# Patient Record
Sex: Female | Born: 1965 | Race: White | Hispanic: No | Marital: Single | Smoking: Current every day smoker
Health system: Southern US, Community
[De-identification: ages and names within clinical notes are randomized; demographics above are authoritative.]

## PROBLEM LIST (undated history)

## (undated) DIAGNOSIS — G43109 Migraine with aura, not intractable, without status migrainosus: Secondary | ICD-10-CM

## (undated) DIAGNOSIS — Z8719 Personal history of other diseases of the digestive system: Secondary | ICD-10-CM

## (undated) DIAGNOSIS — K589 Irritable bowel syndrome without diarrhea: Secondary | ICD-10-CM

## (undated) DIAGNOSIS — R7303 Prediabetes: Secondary | ICD-10-CM

## (undated) DIAGNOSIS — F319 Bipolar disorder, unspecified: Secondary | ICD-10-CM

## (undated) DIAGNOSIS — F419 Anxiety disorder, unspecified: Secondary | ICD-10-CM

## (undated) DIAGNOSIS — F32A Depression, unspecified: Secondary | ICD-10-CM

## (undated) DIAGNOSIS — I10 Essential (primary) hypertension: Secondary | ICD-10-CM

## (undated) DIAGNOSIS — K219 Gastro-esophageal reflux disease without esophagitis: Secondary | ICD-10-CM

## (undated) DIAGNOSIS — L409 Psoriasis, unspecified: Secondary | ICD-10-CM

## (undated) DIAGNOSIS — M92219 Osteochondrosis (juvenile) of carpal lunate [Kienbock], unspecified hand: Secondary | ICD-10-CM

## (undated) DIAGNOSIS — K2270 Barrett's esophagus without dysplasia: Secondary | ICD-10-CM

## (undated) DIAGNOSIS — F329 Major depressive disorder, single episode, unspecified: Secondary | ICD-10-CM

## (undated) DIAGNOSIS — M1990 Unspecified osteoarthritis, unspecified site: Secondary | ICD-10-CM

## (undated) DIAGNOSIS — Z87448 Personal history of other diseases of urinary system: Secondary | ICD-10-CM

## (undated) DIAGNOSIS — Z8711 Personal history of peptic ulcer disease: Secondary | ICD-10-CM

## (undated) DIAGNOSIS — Z8614 Personal history of Methicillin resistant Staphylococcus aureus infection: Secondary | ICD-10-CM

## (undated) DIAGNOSIS — E669 Obesity, unspecified: Secondary | ICD-10-CM

## (undated) HISTORY — DX: Irritable bowel syndrome, unspecified: K58.9

## (undated) HISTORY — PX: MULTIPLE TOOTH EXTRACTIONS: SHX2053

## (undated) HISTORY — DX: Barrett's esophagus without dysplasia: K22.70

## (undated) HISTORY — PX: KNEE ARTHROSCOPY: SHX127

## (undated) HISTORY — PX: CERVICAL FUSION: SHX112

---

## 1989-01-11 HISTORY — PX: TUBAL LIGATION: SHX77

## 1989-01-11 HISTORY — PX: CHOLECYSTECTOMY: SHX55

## 1997-06-25 ENCOUNTER — Emergency Department (HOSPITAL_COMMUNITY): Admission: EM | Admit: 1997-06-25 | Discharge: 1997-06-25 | Payer: Self-pay | Admitting: Emergency Medicine

## 1997-09-25 ENCOUNTER — Emergency Department (HOSPITAL_COMMUNITY): Admission: EM | Admit: 1997-09-25 | Discharge: 1997-09-25 | Payer: Self-pay | Admitting: Emergency Medicine

## 1998-03-10 ENCOUNTER — Emergency Department (HOSPITAL_COMMUNITY): Admission: EM | Admit: 1998-03-10 | Discharge: 1998-03-10 | Payer: Self-pay

## 1998-03-27 ENCOUNTER — Ambulatory Visit (HOSPITAL_COMMUNITY): Admission: RE | Admit: 1998-03-27 | Discharge: 1998-03-27 | Payer: Self-pay | Admitting: Neurosurgery

## 1998-03-27 ENCOUNTER — Encounter: Payer: Self-pay | Admitting: Neurosurgery

## 1998-04-04 ENCOUNTER — Encounter: Payer: Self-pay | Admitting: Neurosurgery

## 1998-04-08 ENCOUNTER — Encounter: Payer: Self-pay | Admitting: Neurosurgery

## 1998-04-08 ENCOUNTER — Inpatient Hospital Stay (HOSPITAL_COMMUNITY): Admission: RE | Admit: 1998-04-08 | Discharge: 1998-04-10 | Payer: Self-pay | Admitting: Neurosurgery

## 1999-02-28 ENCOUNTER — Encounter: Payer: Self-pay | Admitting: Neurosurgery

## 1999-02-28 ENCOUNTER — Ambulatory Visit (HOSPITAL_COMMUNITY): Admission: RE | Admit: 1999-02-28 | Discharge: 1999-02-28 | Payer: Self-pay | Admitting: Neurosurgery

## 1999-03-01 ENCOUNTER — Encounter: Payer: Self-pay | Admitting: Neurosurgery

## 1999-09-09 ENCOUNTER — Encounter: Payer: Self-pay | Admitting: Emergency Medicine

## 1999-09-09 ENCOUNTER — Emergency Department (HOSPITAL_COMMUNITY): Admission: EM | Admit: 1999-09-09 | Discharge: 1999-09-09 | Payer: Self-pay | Admitting: Emergency Medicine

## 2000-09-19 ENCOUNTER — Emergency Department (HOSPITAL_COMMUNITY): Admission: EM | Admit: 2000-09-19 | Discharge: 2000-09-20 | Payer: Self-pay | Admitting: Emergency Medicine

## 2000-10-03 ENCOUNTER — Emergency Department (HOSPITAL_COMMUNITY): Admission: EM | Admit: 2000-10-03 | Discharge: 2000-10-03 | Payer: Self-pay | Admitting: Emergency Medicine

## 2000-12-10 ENCOUNTER — Emergency Department (HOSPITAL_COMMUNITY): Admission: EM | Admit: 2000-12-10 | Discharge: 2000-12-10 | Payer: Self-pay | Admitting: Emergency Medicine

## 2000-12-10 ENCOUNTER — Encounter: Payer: Self-pay | Admitting: Emergency Medicine

## 2004-06-24 ENCOUNTER — Emergency Department (HOSPITAL_COMMUNITY): Admission: EM | Admit: 2004-06-24 | Discharge: 2004-06-24 | Payer: Self-pay | Admitting: Emergency Medicine

## 2004-09-03 ENCOUNTER — Emergency Department (HOSPITAL_COMMUNITY): Admission: EM | Admit: 2004-09-03 | Discharge: 2004-09-03 | Payer: Self-pay | Admitting: Emergency Medicine

## 2004-11-26 ENCOUNTER — Emergency Department (HOSPITAL_COMMUNITY): Admission: EM | Admit: 2004-11-26 | Discharge: 2004-11-26 | Payer: Self-pay | Admitting: Emergency Medicine

## 2005-02-09 ENCOUNTER — Emergency Department (HOSPITAL_COMMUNITY): Admission: EM | Admit: 2005-02-09 | Discharge: 2005-02-09 | Payer: Self-pay | Admitting: Emergency Medicine

## 2005-02-11 ENCOUNTER — Emergency Department (HOSPITAL_COMMUNITY): Admission: EM | Admit: 2005-02-11 | Discharge: 2005-02-11 | Payer: Self-pay | Admitting: Emergency Medicine

## 2005-06-09 ENCOUNTER — Emergency Department (HOSPITAL_COMMUNITY): Admission: EM | Admit: 2005-06-09 | Discharge: 2005-06-09 | Payer: Self-pay | Admitting: Emergency Medicine

## 2005-07-27 ENCOUNTER — Emergency Department (HOSPITAL_COMMUNITY): Admission: EM | Admit: 2005-07-27 | Discharge: 2005-07-27 | Payer: Self-pay | Admitting: Emergency Medicine

## 2005-09-14 ENCOUNTER — Emergency Department (HOSPITAL_COMMUNITY): Admission: EM | Admit: 2005-09-14 | Discharge: 2005-09-14 | Payer: Self-pay | Admitting: Emergency Medicine

## 2005-10-25 ENCOUNTER — Emergency Department (HOSPITAL_COMMUNITY): Admission: EM | Admit: 2005-10-25 | Discharge: 2005-10-25 | Payer: Self-pay | Admitting: Emergency Medicine

## 2005-10-31 ENCOUNTER — Emergency Department (HOSPITAL_COMMUNITY): Admission: EM | Admit: 2005-10-31 | Discharge: 2005-10-31 | Payer: Self-pay | Admitting: Emergency Medicine

## 2005-11-04 ENCOUNTER — Ambulatory Visit: Payer: Self-pay | Admitting: Internal Medicine

## 2005-11-08 ENCOUNTER — Ambulatory Visit: Payer: Self-pay | Admitting: *Deleted

## 2005-11-17 ENCOUNTER — Ambulatory Visit (HOSPITAL_COMMUNITY): Admission: RE | Admit: 2005-11-17 | Discharge: 2005-11-17 | Payer: Self-pay | Admitting: Internal Medicine

## 2005-11-19 ENCOUNTER — Ambulatory Visit: Payer: Self-pay | Admitting: Internal Medicine

## 2005-12-23 ENCOUNTER — Emergency Department (HOSPITAL_COMMUNITY): Admission: EM | Admit: 2005-12-23 | Discharge: 2005-12-23 | Payer: Self-pay | Admitting: Emergency Medicine

## 2005-12-23 ENCOUNTER — Emergency Department (HOSPITAL_COMMUNITY): Admission: EM | Admit: 2005-12-23 | Discharge: 2005-12-23 | Payer: Self-pay | Admitting: Family Medicine

## 2006-02-01 ENCOUNTER — Ambulatory Visit: Payer: Self-pay | Admitting: Family Medicine

## 2006-03-25 ENCOUNTER — Ambulatory Visit: Payer: Self-pay | Admitting: Internal Medicine

## 2006-04-11 ENCOUNTER — Emergency Department (HOSPITAL_COMMUNITY): Admission: EM | Admit: 2006-04-11 | Discharge: 2006-04-11 | Payer: Self-pay | Admitting: Emergency Medicine

## 2006-06-07 ENCOUNTER — Encounter (INDEPENDENT_AMBULATORY_CARE_PROVIDER_SITE_OTHER): Payer: Self-pay | Admitting: Internal Medicine

## 2006-06-14 ENCOUNTER — Encounter: Admission: RE | Admit: 2006-06-14 | Discharge: 2006-07-11 | Payer: Self-pay | Admitting: Orthopedic Surgery

## 2006-07-19 ENCOUNTER — Encounter (INDEPENDENT_AMBULATORY_CARE_PROVIDER_SITE_OTHER): Payer: Self-pay | Admitting: Internal Medicine

## 2006-08-04 ENCOUNTER — Encounter (INDEPENDENT_AMBULATORY_CARE_PROVIDER_SITE_OTHER): Payer: Self-pay | Admitting: Internal Medicine

## 2006-08-11 ENCOUNTER — Encounter: Admission: RE | Admit: 2006-08-11 | Discharge: 2006-08-11 | Payer: Self-pay | Admitting: Specialist

## 2006-08-24 ENCOUNTER — Emergency Department (HOSPITAL_COMMUNITY): Admission: EM | Admit: 2006-08-24 | Discharge: 2006-08-24 | Payer: Self-pay | Admitting: Emergency Medicine

## 2006-09-07 ENCOUNTER — Encounter: Admission: RE | Admit: 2006-09-07 | Discharge: 2006-09-07 | Payer: Self-pay | Admitting: Orthopedic Surgery

## 2006-09-21 ENCOUNTER — Emergency Department (HOSPITAL_COMMUNITY): Admission: EM | Admit: 2006-09-21 | Discharge: 2006-09-21 | Payer: Self-pay | Admitting: Emergency Medicine

## 2006-09-28 ENCOUNTER — Encounter (INDEPENDENT_AMBULATORY_CARE_PROVIDER_SITE_OTHER): Payer: Self-pay | Admitting: *Deleted

## 2006-10-18 ENCOUNTER — Emergency Department (HOSPITAL_COMMUNITY): Admission: EM | Admit: 2006-10-18 | Discharge: 2006-10-18 | Payer: Self-pay | Admitting: Emergency Medicine

## 2006-11-08 ENCOUNTER — Encounter (INDEPENDENT_AMBULATORY_CARE_PROVIDER_SITE_OTHER): Payer: Self-pay | Admitting: Internal Medicine

## 2006-12-05 ENCOUNTER — Encounter (INDEPENDENT_AMBULATORY_CARE_PROVIDER_SITE_OTHER): Payer: Self-pay | Admitting: Internal Medicine

## 2006-12-05 ENCOUNTER — Inpatient Hospital Stay (HOSPITAL_COMMUNITY): Admission: RE | Admit: 2006-12-05 | Discharge: 2006-12-10 | Payer: Self-pay | Admitting: Orthopedic Surgery

## 2006-12-05 DIAGNOSIS — M5137 Other intervertebral disc degeneration, lumbosacral region: Secondary | ICD-10-CM | POA: Insufficient documentation

## 2006-12-05 HISTORY — PX: ANTERIOR LUMBAR FUSION: SHX1170

## 2006-12-06 ENCOUNTER — Encounter (INDEPENDENT_AMBULATORY_CARE_PROVIDER_SITE_OTHER): Payer: Self-pay | Admitting: Orthopedic Surgery

## 2006-12-06 ENCOUNTER — Encounter (INDEPENDENT_AMBULATORY_CARE_PROVIDER_SITE_OTHER): Payer: Self-pay | Admitting: Internal Medicine

## 2006-12-06 ENCOUNTER — Ambulatory Visit: Payer: Self-pay | Admitting: *Deleted

## 2006-12-18 ENCOUNTER — Emergency Department (HOSPITAL_COMMUNITY): Admission: EM | Admit: 2006-12-18 | Discharge: 2006-12-18 | Payer: Self-pay | Admitting: Emergency Medicine

## 2006-12-26 ENCOUNTER — Encounter (INDEPENDENT_AMBULATORY_CARE_PROVIDER_SITE_OTHER): Payer: Self-pay | Admitting: Internal Medicine

## 2007-01-23 ENCOUNTER — Encounter (INDEPENDENT_AMBULATORY_CARE_PROVIDER_SITE_OTHER): Payer: Self-pay | Admitting: Internal Medicine

## 2007-01-27 ENCOUNTER — Ambulatory Visit: Payer: Self-pay | Admitting: Internal Medicine

## 2007-01-27 DIAGNOSIS — B372 Candidiasis of skin and nail: Secondary | ICD-10-CM | POA: Insufficient documentation

## 2007-01-29 DIAGNOSIS — F329 Major depressive disorder, single episode, unspecified: Secondary | ICD-10-CM | POA: Insufficient documentation

## 2007-01-29 DIAGNOSIS — F411 Generalized anxiety disorder: Secondary | ICD-10-CM | POA: Insufficient documentation

## 2007-02-08 ENCOUNTER — Emergency Department (HOSPITAL_COMMUNITY): Admission: EM | Admit: 2007-02-08 | Discharge: 2007-02-08 | Payer: Self-pay | Admitting: Emergency Medicine

## 2007-02-08 ENCOUNTER — Encounter: Admission: RE | Admit: 2007-02-08 | Discharge: 2007-03-02 | Payer: Self-pay | Admitting: Orthopedic Surgery

## 2007-03-06 ENCOUNTER — Encounter (INDEPENDENT_AMBULATORY_CARE_PROVIDER_SITE_OTHER): Payer: Self-pay | Admitting: Internal Medicine

## 2007-03-10 ENCOUNTER — Emergency Department (HOSPITAL_COMMUNITY): Admission: EM | Admit: 2007-03-10 | Discharge: 2007-03-10 | Payer: Self-pay | Admitting: Emergency Medicine

## 2007-03-12 ENCOUNTER — Emergency Department (HOSPITAL_COMMUNITY): Admission: EM | Admit: 2007-03-12 | Discharge: 2007-03-12 | Payer: Self-pay | Admitting: Family Medicine

## 2007-03-21 ENCOUNTER — Ambulatory Visit: Payer: Self-pay | Admitting: Internal Medicine

## 2007-03-22 ENCOUNTER — Encounter (INDEPENDENT_AMBULATORY_CARE_PROVIDER_SITE_OTHER): Payer: Self-pay | Admitting: Internal Medicine

## 2007-03-27 ENCOUNTER — Telehealth (INDEPENDENT_AMBULATORY_CARE_PROVIDER_SITE_OTHER): Payer: Self-pay | Admitting: Internal Medicine

## 2007-03-30 ENCOUNTER — Encounter (INDEPENDENT_AMBULATORY_CARE_PROVIDER_SITE_OTHER): Payer: Self-pay | Admitting: Internal Medicine

## 2007-03-31 ENCOUNTER — Encounter: Admission: RE | Admit: 2007-03-31 | Discharge: 2007-06-06 | Payer: Self-pay | Admitting: Orthopedic Surgery

## 2007-04-04 ENCOUNTER — Encounter (INDEPENDENT_AMBULATORY_CARE_PROVIDER_SITE_OTHER): Payer: Self-pay | Admitting: Internal Medicine

## 2007-04-05 ENCOUNTER — Telehealth (INDEPENDENT_AMBULATORY_CARE_PROVIDER_SITE_OTHER): Payer: Self-pay | Admitting: Internal Medicine

## 2007-04-20 ENCOUNTER — Encounter (INDEPENDENT_AMBULATORY_CARE_PROVIDER_SITE_OTHER): Payer: Self-pay | Admitting: Internal Medicine

## 2007-04-24 ENCOUNTER — Telehealth (INDEPENDENT_AMBULATORY_CARE_PROVIDER_SITE_OTHER): Payer: Self-pay | Admitting: Internal Medicine

## 2007-04-26 DIAGNOSIS — L538 Other specified erythematous conditions: Secondary | ICD-10-CM | POA: Insufficient documentation

## 2007-06-06 ENCOUNTER — Encounter (INDEPENDENT_AMBULATORY_CARE_PROVIDER_SITE_OTHER): Payer: Self-pay | Admitting: Internal Medicine

## 2007-07-09 ENCOUNTER — Emergency Department (HOSPITAL_COMMUNITY): Admission: EM | Admit: 2007-07-09 | Discharge: 2007-07-09 | Payer: Self-pay | Admitting: Emergency Medicine

## 2007-07-12 ENCOUNTER — Encounter (INDEPENDENT_AMBULATORY_CARE_PROVIDER_SITE_OTHER): Payer: Self-pay | Admitting: Nurse Practitioner

## 2007-07-20 DIAGNOSIS — M1711 Unilateral primary osteoarthritis, right knee: Secondary | ICD-10-CM | POA: Insufficient documentation

## 2007-07-24 ENCOUNTER — Encounter (INDEPENDENT_AMBULATORY_CARE_PROVIDER_SITE_OTHER): Payer: Self-pay | Admitting: Nurse Practitioner

## 2007-08-04 ENCOUNTER — Encounter (INDEPENDENT_AMBULATORY_CARE_PROVIDER_SITE_OTHER): Payer: Self-pay | Admitting: Internal Medicine

## 2007-09-02 ENCOUNTER — Encounter (INDEPENDENT_AMBULATORY_CARE_PROVIDER_SITE_OTHER): Payer: Self-pay | Admitting: Internal Medicine

## 2007-09-19 ENCOUNTER — Telehealth (INDEPENDENT_AMBULATORY_CARE_PROVIDER_SITE_OTHER): Payer: Self-pay | Admitting: Internal Medicine

## 2007-09-20 ENCOUNTER — Encounter (INDEPENDENT_AMBULATORY_CARE_PROVIDER_SITE_OTHER): Payer: Self-pay | Admitting: Internal Medicine

## 2007-09-29 ENCOUNTER — Encounter (INDEPENDENT_AMBULATORY_CARE_PROVIDER_SITE_OTHER): Payer: Self-pay | Admitting: *Deleted

## 2007-09-30 ENCOUNTER — Encounter (INDEPENDENT_AMBULATORY_CARE_PROVIDER_SITE_OTHER): Payer: Self-pay | Admitting: Internal Medicine

## 2007-10-09 ENCOUNTER — Emergency Department (HOSPITAL_COMMUNITY): Admission: EM | Admit: 2007-10-09 | Discharge: 2007-10-09 | Payer: Self-pay | Admitting: Emergency Medicine

## 2007-10-11 ENCOUNTER — Encounter (INDEPENDENT_AMBULATORY_CARE_PROVIDER_SITE_OTHER): Payer: Self-pay | Admitting: Internal Medicine

## 2007-10-19 ENCOUNTER — Emergency Department (HOSPITAL_COMMUNITY): Admission: EM | Admit: 2007-10-19 | Discharge: 2007-10-19 | Payer: Self-pay | Admitting: Emergency Medicine

## 2007-10-23 ENCOUNTER — Ambulatory Visit: Payer: Self-pay | Admitting: Nurse Practitioner

## 2007-10-23 DIAGNOSIS — J209 Acute bronchitis, unspecified: Secondary | ICD-10-CM | POA: Insufficient documentation

## 2007-10-23 LAB — CONVERTED CEMR LAB
Basophils Absolute: 0 10*3/uL (ref 0.0–0.1)
Basophils Relative: 0 % (ref 0–1)
Eosinophils Absolute: 0.2 10*3/uL (ref 0.0–0.7)
Eosinophils Relative: 2 % (ref 0–5)
HCT: 48.5 % — ABNORMAL HIGH (ref 36.0–46.0)
Hemoglobin: 15.6 g/dL — ABNORMAL HIGH (ref 12.0–15.0)
Lymphocytes Relative: 27 % (ref 12–46)
Lymphs Abs: 2.4 10*3/uL (ref 0.7–4.0)
MCHC: 32.2 g/dL (ref 30.0–36.0)
MCV: 94.2 fL (ref 78.0–100.0)
Monocytes Absolute: 0.7 10*3/uL (ref 0.1–1.0)
Monocytes Relative: 8 % (ref 3–12)
Neutro Abs: 5.6 10*3/uL (ref 1.7–7.7)
Neutrophils Relative %: 63 % (ref 43–77)
Platelets: 190 10*3/uL (ref 150–400)
RBC: 5.15 M/uL — ABNORMAL HIGH (ref 3.87–5.11)
RDW: 14.3 % (ref 11.5–15.5)
WBC: 9 10*3/uL (ref 4.0–10.5)

## 2007-11-22 ENCOUNTER — Emergency Department (HOSPITAL_COMMUNITY): Admission: EM | Admit: 2007-11-22 | Discharge: 2007-11-22 | Payer: Self-pay | Admitting: Emergency Medicine

## 2007-11-28 ENCOUNTER — Inpatient Hospital Stay (HOSPITAL_COMMUNITY): Admission: RE | Admit: 2007-11-28 | Discharge: 2007-12-01 | Payer: Self-pay | Admitting: Orthopaedic Surgery

## 2007-11-28 HISTORY — PX: TOTAL KNEE ARTHROPLASTY: SHX125

## 2007-12-04 ENCOUNTER — Encounter (INDEPENDENT_AMBULATORY_CARE_PROVIDER_SITE_OTHER): Payer: Self-pay | Admitting: Internal Medicine

## 2007-12-09 ENCOUNTER — Encounter (INDEPENDENT_AMBULATORY_CARE_PROVIDER_SITE_OTHER): Payer: Self-pay | Admitting: Internal Medicine

## 2007-12-19 ENCOUNTER — Telehealth (INDEPENDENT_AMBULATORY_CARE_PROVIDER_SITE_OTHER): Payer: Self-pay | Admitting: Internal Medicine

## 2007-12-25 ENCOUNTER — Encounter (INDEPENDENT_AMBULATORY_CARE_PROVIDER_SITE_OTHER): Payer: Self-pay | Admitting: Internal Medicine

## 2007-12-26 ENCOUNTER — Telehealth (INDEPENDENT_AMBULATORY_CARE_PROVIDER_SITE_OTHER): Payer: Self-pay | Admitting: Internal Medicine

## 2007-12-28 ENCOUNTER — Encounter (INDEPENDENT_AMBULATORY_CARE_PROVIDER_SITE_OTHER): Payer: Self-pay | Admitting: Internal Medicine

## 2008-01-09 ENCOUNTER — Encounter (INDEPENDENT_AMBULATORY_CARE_PROVIDER_SITE_OTHER): Payer: Self-pay | Admitting: *Deleted

## 2008-01-11 ENCOUNTER — Encounter (INDEPENDENT_AMBULATORY_CARE_PROVIDER_SITE_OTHER): Payer: Self-pay | Admitting: Internal Medicine

## 2008-01-29 ENCOUNTER — Encounter (INDEPENDENT_AMBULATORY_CARE_PROVIDER_SITE_OTHER): Payer: Self-pay | Admitting: Internal Medicine

## 2008-02-19 ENCOUNTER — Telehealth (INDEPENDENT_AMBULATORY_CARE_PROVIDER_SITE_OTHER): Payer: Self-pay | Admitting: Internal Medicine

## 2008-03-04 ENCOUNTER — Encounter (INDEPENDENT_AMBULATORY_CARE_PROVIDER_SITE_OTHER): Payer: Self-pay | Admitting: Internal Medicine

## 2008-03-09 ENCOUNTER — Emergency Department (HOSPITAL_COMMUNITY): Admission: EM | Admit: 2008-03-09 | Discharge: 2008-03-09 | Payer: Self-pay | Admitting: Emergency Medicine

## 2008-03-11 ENCOUNTER — Telehealth (INDEPENDENT_AMBULATORY_CARE_PROVIDER_SITE_OTHER): Payer: Self-pay | Admitting: *Deleted

## 2008-03-25 ENCOUNTER — Telehealth (INDEPENDENT_AMBULATORY_CARE_PROVIDER_SITE_OTHER): Payer: Self-pay | Admitting: Internal Medicine

## 2008-04-15 ENCOUNTER — Telehealth (INDEPENDENT_AMBULATORY_CARE_PROVIDER_SITE_OTHER): Payer: Self-pay | Admitting: Internal Medicine

## 2008-04-19 ENCOUNTER — Telehealth (INDEPENDENT_AMBULATORY_CARE_PROVIDER_SITE_OTHER): Payer: Self-pay | Admitting: Internal Medicine

## 2008-04-26 ENCOUNTER — Ambulatory Visit: Payer: Self-pay | Admitting: Internal Medicine

## 2008-04-26 ENCOUNTER — Encounter (INDEPENDENT_AMBULATORY_CARE_PROVIDER_SITE_OTHER): Payer: Self-pay | Admitting: Internal Medicine

## 2008-04-26 ENCOUNTER — Other Ambulatory Visit: Admission: RE | Admit: 2008-04-26 | Discharge: 2008-04-26 | Payer: Self-pay | Admitting: Internal Medicine

## 2008-04-26 DIAGNOSIS — M542 Cervicalgia: Secondary | ICD-10-CM | POA: Insufficient documentation

## 2008-04-26 DIAGNOSIS — H60399 Other infective otitis externa, unspecified ear: Secondary | ICD-10-CM | POA: Insufficient documentation

## 2008-04-26 DIAGNOSIS — B373 Candidiasis of vulva and vagina: Secondary | ICD-10-CM | POA: Insufficient documentation

## 2008-04-26 LAB — CONVERTED CEMR LAB
ALT: 11 units/L (ref 0–35)
AST: 13 units/L (ref 0–37)
Albumin: 3.7 g/dL (ref 3.5–5.2)
Alkaline Phosphatase: 103 units/L (ref 39–117)
BUN: 14 mg/dL (ref 6–23)
Basophils Absolute: 0 10*3/uL (ref 0.0–0.1)
Basophils Relative: 0 % (ref 0–1)
Bilirubin Urine: NEGATIVE
Blood in Urine, dipstick: NEGATIVE
CO2: 27 meq/L (ref 19–32)
Calcium: 8.6 mg/dL (ref 8.4–10.5)
Chlamydia, DNA Probe: NEGATIVE
Chloride: 105 meq/L (ref 96–112)
Cholesterol: 127 mg/dL (ref 0–200)
Creatinine, Ser: 1.19 mg/dL (ref 0.40–1.20)
Eosinophils Absolute: 0.1 10*3/uL (ref 0.0–0.7)
Eosinophils Relative: 2 % (ref 0–5)
GC Probe Amp, Genital: NEGATIVE
Glucose, Bld: 94 mg/dL (ref 70–99)
Glucose, Urine, Semiquant: NEGATIVE
HCT: 43.4 % (ref 36.0–46.0)
HDL: 46 mg/dL (ref >39)
Hemoglobin: 14 g/dL (ref 12.0–15.0)
KOH Prep: NEGATIVE
Ketones, urine, test strip: NEGATIVE
LDL Cholesterol: 66 mg/dL (ref 0–99)
Lymphocytes Relative: 45 % (ref 12–46)
Lymphs Abs: 2 10*3/uL (ref 0.7–4.0)
MCHC: 32.3 g/dL (ref 30.0–36.0)
MCV: 95.6 fL (ref 78.0–100.0)
Monocytes Absolute: 0.2 10*3/uL (ref 0.1–1.0)
Monocytes Relative: 5 % (ref 3–12)
Neutro Abs: 2.1 10*3/uL (ref 1.7–7.7)
Neutrophils Relative %: 47 % (ref 43–77)
Nitrite: NEGATIVE
Platelets: 122 10*3/uL — ABNORMAL LOW (ref 150–400)
Potassium: 3.9 meq/L (ref 3.5–5.3)
Protein, U semiquant: NEGATIVE
RBC: 4.54 M/uL (ref 3.87–5.11)
RDW: 14.6 % (ref 11.5–15.5)
Sodium: 144 meq/L (ref 135–145)
Specific Gravity, Urine: 1.02
Total Bilirubin: 0.4 mg/dL (ref 0.3–1.2)
Total CHOL/HDL Ratio: 2.8
Total Protein: 6.9 g/dL (ref 6.0–8.3)
Triglycerides: 73 mg/dL (ref <150)
Urobilinogen, UA: 0.2
VLDL: 15 mg/dL (ref 0–40)
WBC Urine, dipstick: NEGATIVE
WBC: 4.5 10*3/uL (ref 4.0–10.5)
Whiff Test: NEGATIVE
pH: 6

## 2008-05-02 ENCOUNTER — Telehealth (INDEPENDENT_AMBULATORY_CARE_PROVIDER_SITE_OTHER): Payer: Self-pay | Admitting: Internal Medicine

## 2008-05-02 ENCOUNTER — Encounter (INDEPENDENT_AMBULATORY_CARE_PROVIDER_SITE_OTHER): Payer: Self-pay | Admitting: Internal Medicine

## 2008-05-03 ENCOUNTER — Encounter (INDEPENDENT_AMBULATORY_CARE_PROVIDER_SITE_OTHER): Payer: Self-pay | Admitting: Internal Medicine

## 2008-05-05 ENCOUNTER — Encounter (INDEPENDENT_AMBULATORY_CARE_PROVIDER_SITE_OTHER): Payer: Self-pay | Admitting: Internal Medicine

## 2008-05-08 ENCOUNTER — Encounter (INDEPENDENT_AMBULATORY_CARE_PROVIDER_SITE_OTHER): Payer: Self-pay | Admitting: Internal Medicine

## 2008-05-09 ENCOUNTER — Encounter (INDEPENDENT_AMBULATORY_CARE_PROVIDER_SITE_OTHER): Payer: Self-pay | Admitting: Internal Medicine

## 2008-05-15 ENCOUNTER — Emergency Department (HOSPITAL_COMMUNITY): Admission: EM | Admit: 2008-05-15 | Discharge: 2008-05-15 | Payer: Self-pay | Admitting: Emergency Medicine

## 2008-05-17 ENCOUNTER — Telehealth (INDEPENDENT_AMBULATORY_CARE_PROVIDER_SITE_OTHER): Payer: Self-pay | Admitting: Internal Medicine

## 2008-06-02 ENCOUNTER — Ambulatory Visit: Payer: Self-pay | Admitting: *Deleted

## 2008-06-02 ENCOUNTER — Encounter (INDEPENDENT_AMBULATORY_CARE_PROVIDER_SITE_OTHER): Payer: Self-pay | Admitting: Emergency Medicine

## 2008-06-02 ENCOUNTER — Emergency Department (HOSPITAL_COMMUNITY): Admission: EM | Admit: 2008-06-02 | Discharge: 2008-06-02 | Payer: Self-pay | Admitting: Emergency Medicine

## 2008-06-05 ENCOUNTER — Encounter (INDEPENDENT_AMBULATORY_CARE_PROVIDER_SITE_OTHER): Payer: Self-pay | Admitting: Internal Medicine

## 2008-06-06 ENCOUNTER — Encounter (INDEPENDENT_AMBULATORY_CARE_PROVIDER_SITE_OTHER): Payer: Self-pay | Admitting: Internal Medicine

## 2008-06-06 ENCOUNTER — Telehealth (INDEPENDENT_AMBULATORY_CARE_PROVIDER_SITE_OTHER): Payer: Self-pay | Admitting: Internal Medicine

## 2008-06-24 ENCOUNTER — Emergency Department (HOSPITAL_COMMUNITY): Admission: EM | Admit: 2008-06-24 | Discharge: 2008-06-24 | Payer: Self-pay | Admitting: Family Medicine

## 2008-07-26 ENCOUNTER — Ambulatory Visit: Payer: Self-pay | Admitting: Internal Medicine

## 2008-07-26 DIAGNOSIS — E669 Obesity, unspecified: Secondary | ICD-10-CM | POA: Insufficient documentation

## 2008-07-31 ENCOUNTER — Encounter (INDEPENDENT_AMBULATORY_CARE_PROVIDER_SITE_OTHER): Payer: Self-pay | Admitting: Internal Medicine

## 2008-08-22 ENCOUNTER — Telehealth (INDEPENDENT_AMBULATORY_CARE_PROVIDER_SITE_OTHER): Payer: Self-pay | Admitting: Internal Medicine

## 2008-09-02 ENCOUNTER — Telehealth (INDEPENDENT_AMBULATORY_CARE_PROVIDER_SITE_OTHER): Payer: Self-pay | Admitting: Internal Medicine

## 2008-09-23 ENCOUNTER — Telehealth (INDEPENDENT_AMBULATORY_CARE_PROVIDER_SITE_OTHER): Payer: Self-pay | Admitting: Internal Medicine

## 2008-10-01 ENCOUNTER — Encounter (INDEPENDENT_AMBULATORY_CARE_PROVIDER_SITE_OTHER): Payer: Self-pay | Admitting: Internal Medicine

## 2008-10-23 ENCOUNTER — Telehealth (INDEPENDENT_AMBULATORY_CARE_PROVIDER_SITE_OTHER): Payer: Self-pay | Admitting: Internal Medicine

## 2008-10-29 ENCOUNTER — Telehealth (INDEPENDENT_AMBULATORY_CARE_PROVIDER_SITE_OTHER): Payer: Self-pay | Admitting: Internal Medicine

## 2008-11-05 ENCOUNTER — Encounter (INDEPENDENT_AMBULATORY_CARE_PROVIDER_SITE_OTHER): Payer: Self-pay | Admitting: Internal Medicine

## 2008-11-21 ENCOUNTER — Ambulatory Visit: Payer: Self-pay | Admitting: Internal Medicine

## 2008-11-21 LAB — CONVERTED CEMR LAB
Amphetamine Screen, Ur: NEGATIVE
Barbiturate Quant, Ur: NEGATIVE
Benzodiazepines.: NEGATIVE
Cocaine Metabolites: NEGATIVE
Creatinine,U: 44.2 mg/dL
Marijuana Metabolite: NEGATIVE
Methadone: NEGATIVE
Opiate Screen, Urine: NEGATIVE
Phencyclidine (PCP): NEGATIVE
Propoxyphene: NEGATIVE

## 2008-11-26 ENCOUNTER — Emergency Department (HOSPITAL_COMMUNITY): Admission: EM | Admit: 2008-11-26 | Discharge: 2008-11-26 | Payer: Self-pay | Admitting: Emergency Medicine

## 2008-11-26 ENCOUNTER — Encounter (INDEPENDENT_AMBULATORY_CARE_PROVIDER_SITE_OTHER): Payer: Self-pay | Admitting: Internal Medicine

## 2008-12-23 ENCOUNTER — Telehealth (INDEPENDENT_AMBULATORY_CARE_PROVIDER_SITE_OTHER): Payer: Self-pay | Admitting: Internal Medicine

## 2009-01-13 ENCOUNTER — Encounter: Admission: RE | Admit: 2009-01-13 | Discharge: 2009-04-13 | Payer: Self-pay | Admitting: Anesthesiology

## 2009-01-18 ENCOUNTER — Emergency Department (HOSPITAL_COMMUNITY): Admission: EM | Admit: 2009-01-18 | Discharge: 2009-01-18 | Payer: Self-pay | Admitting: Emergency Medicine

## 2009-02-18 ENCOUNTER — Ambulatory Visit: Payer: Self-pay | Admitting: Internal Medicine

## 2009-02-18 DIAGNOSIS — F17200 Nicotine dependence, unspecified, uncomplicated: Secondary | ICD-10-CM | POA: Insufficient documentation

## 2009-02-18 DIAGNOSIS — N951 Menopausal and female climacteric states: Secondary | ICD-10-CM | POA: Insufficient documentation

## 2009-02-18 DIAGNOSIS — L299 Pruritus, unspecified: Secondary | ICD-10-CM | POA: Insufficient documentation

## 2009-03-12 ENCOUNTER — Telehealth (INDEPENDENT_AMBULATORY_CARE_PROVIDER_SITE_OTHER): Payer: Self-pay | Admitting: Internal Medicine

## 2009-03-13 ENCOUNTER — Emergency Department (HOSPITAL_COMMUNITY): Admission: EM | Admit: 2009-03-13 | Discharge: 2009-03-13 | Payer: Self-pay | Admitting: Family Medicine

## 2009-03-25 ENCOUNTER — Telehealth (INDEPENDENT_AMBULATORY_CARE_PROVIDER_SITE_OTHER): Payer: Self-pay | Admitting: Internal Medicine

## 2009-03-31 ENCOUNTER — Emergency Department (HOSPITAL_COMMUNITY): Admission: EM | Admit: 2009-03-31 | Discharge: 2009-03-31 | Payer: Self-pay | Admitting: Emergency Medicine

## 2009-04-01 ENCOUNTER — Telehealth (INDEPENDENT_AMBULATORY_CARE_PROVIDER_SITE_OTHER): Payer: Self-pay | Admitting: Internal Medicine

## 2009-04-08 ENCOUNTER — Ambulatory Visit: Payer: Self-pay | Admitting: Internal Medicine

## 2009-04-08 ENCOUNTER — Telehealth (INDEPENDENT_AMBULATORY_CARE_PROVIDER_SITE_OTHER): Payer: Self-pay | Admitting: Internal Medicine

## 2009-04-08 DIAGNOSIS — J42 Unspecified chronic bronchitis: Secondary | ICD-10-CM | POA: Insufficient documentation

## 2009-04-08 DIAGNOSIS — R609 Edema, unspecified: Secondary | ICD-10-CM | POA: Insufficient documentation

## 2009-04-22 ENCOUNTER — Ambulatory Visit: Payer: Self-pay | Admitting: Internal Medicine

## 2009-04-25 DIAGNOSIS — R7309 Other abnormal glucose: Secondary | ICD-10-CM | POA: Insufficient documentation

## 2009-04-25 LAB — CONVERTED CEMR LAB
BUN: 13 mg/dL (ref 6–23)
CO2: 32 meq/L (ref 19–32)
Calcium: 8.9 mg/dL (ref 8.4–10.5)
Chloride: 101 meq/L (ref 96–112)
Creatinine, Ser: 1.14 mg/dL (ref 0.40–1.20)
Glucose, Bld: 138 mg/dL — ABNORMAL HIGH (ref 70–99)
Potassium: 4.2 meq/L (ref 3.5–5.3)
Sodium: 143 meq/L (ref 135–145)

## 2009-04-28 ENCOUNTER — Telehealth (INDEPENDENT_AMBULATORY_CARE_PROVIDER_SITE_OTHER): Payer: Self-pay | Admitting: Internal Medicine

## 2009-04-30 ENCOUNTER — Ambulatory Visit: Payer: Self-pay | Admitting: Internal Medicine

## 2009-04-30 DIAGNOSIS — E119 Type 2 diabetes mellitus without complications: Secondary | ICD-10-CM | POA: Insufficient documentation

## 2009-04-30 LAB — CONVERTED CEMR LAB
Blood Glucose, AC Bkfst: 90 mg/dL
Hgb A1c MFr Bld: 7 %

## 2009-05-01 ENCOUNTER — Encounter (INDEPENDENT_AMBULATORY_CARE_PROVIDER_SITE_OTHER): Payer: Self-pay | Admitting: Internal Medicine

## 2009-05-09 ENCOUNTER — Encounter (INDEPENDENT_AMBULATORY_CARE_PROVIDER_SITE_OTHER): Payer: Self-pay | Admitting: Internal Medicine

## 2009-05-28 ENCOUNTER — Encounter (INDEPENDENT_AMBULATORY_CARE_PROVIDER_SITE_OTHER): Payer: Self-pay | Admitting: Internal Medicine

## 2009-05-28 DIAGNOSIS — G5600 Carpal tunnel syndrome, unspecified upper limb: Secondary | ICD-10-CM | POA: Insufficient documentation

## 2009-05-29 ENCOUNTER — Emergency Department (HOSPITAL_COMMUNITY): Admission: EM | Admit: 2009-05-29 | Discharge: 2009-05-30 | Payer: Self-pay | Admitting: Emergency Medicine

## 2009-06-04 ENCOUNTER — Emergency Department (HOSPITAL_COMMUNITY): Admission: EM | Admit: 2009-06-04 | Discharge: 2009-06-04 | Payer: Self-pay | Admitting: Emergency Medicine

## 2009-06-11 ENCOUNTER — Emergency Department (HOSPITAL_COMMUNITY): Admission: EM | Admit: 2009-06-11 | Discharge: 2009-06-11 | Payer: Self-pay | Admitting: Emergency Medicine

## 2009-06-11 ENCOUNTER — Encounter (INDEPENDENT_AMBULATORY_CARE_PROVIDER_SITE_OTHER): Payer: Self-pay | Admitting: Internal Medicine

## 2009-06-18 ENCOUNTER — Encounter (INDEPENDENT_AMBULATORY_CARE_PROVIDER_SITE_OTHER): Payer: Self-pay | Admitting: Internal Medicine

## 2009-06-30 ENCOUNTER — Encounter (INDEPENDENT_AMBULATORY_CARE_PROVIDER_SITE_OTHER): Payer: Self-pay | Admitting: Internal Medicine

## 2009-07-15 ENCOUNTER — Telehealth (INDEPENDENT_AMBULATORY_CARE_PROVIDER_SITE_OTHER): Payer: Self-pay | Admitting: Internal Medicine

## 2009-07-23 ENCOUNTER — Encounter (INDEPENDENT_AMBULATORY_CARE_PROVIDER_SITE_OTHER): Payer: Self-pay | Admitting: Internal Medicine

## 2009-07-23 ENCOUNTER — Emergency Department (HOSPITAL_COMMUNITY): Admission: EM | Admit: 2009-07-23 | Discharge: 2009-07-23 | Payer: Self-pay | Admitting: Emergency Medicine

## 2009-08-15 ENCOUNTER — Ambulatory Visit (HOSPITAL_COMMUNITY): Admission: RE | Admit: 2009-08-15 | Discharge: 2009-08-15 | Payer: Self-pay | Admitting: Neurosurgery

## 2009-08-15 HISTORY — PX: CARPAL TUNNEL RELEASE: SHX101

## 2009-08-19 ENCOUNTER — Telehealth (INDEPENDENT_AMBULATORY_CARE_PROVIDER_SITE_OTHER): Payer: Self-pay | Admitting: Internal Medicine

## 2009-08-22 ENCOUNTER — Ambulatory Visit: Payer: Self-pay | Admitting: Internal Medicine

## 2009-08-22 DIAGNOSIS — L408 Other psoriasis: Secondary | ICD-10-CM | POA: Insufficient documentation

## 2009-08-22 DIAGNOSIS — B351 Tinea unguium: Secondary | ICD-10-CM | POA: Insufficient documentation

## 2009-08-22 LAB — CONVERTED CEMR LAB: Blood Glucose, Fingerstick: 104

## 2009-08-23 ENCOUNTER — Encounter (INDEPENDENT_AMBULATORY_CARE_PROVIDER_SITE_OTHER): Payer: Self-pay | Admitting: Internal Medicine

## 2009-08-25 ENCOUNTER — Encounter (INDEPENDENT_AMBULATORY_CARE_PROVIDER_SITE_OTHER): Payer: Self-pay | Admitting: Internal Medicine

## 2009-08-25 ENCOUNTER — Emergency Department (HOSPITAL_COMMUNITY): Admission: EM | Admit: 2009-08-25 | Discharge: 2009-08-25 | Payer: Self-pay | Admitting: Emergency Medicine

## 2009-08-25 ENCOUNTER — Telehealth (INDEPENDENT_AMBULATORY_CARE_PROVIDER_SITE_OTHER): Payer: Self-pay | Admitting: Internal Medicine

## 2009-08-27 ENCOUNTER — Encounter (INDEPENDENT_AMBULATORY_CARE_PROVIDER_SITE_OTHER): Payer: Self-pay | Admitting: Internal Medicine

## 2009-09-01 ENCOUNTER — Encounter (INDEPENDENT_AMBULATORY_CARE_PROVIDER_SITE_OTHER): Payer: Self-pay | Admitting: Internal Medicine

## 2009-09-02 ENCOUNTER — Encounter (INDEPENDENT_AMBULATORY_CARE_PROVIDER_SITE_OTHER): Payer: Self-pay | Admitting: Internal Medicine

## 2009-09-09 ENCOUNTER — Encounter
Admission: RE | Admit: 2009-09-09 | Discharge: 2009-12-08 | Payer: Self-pay | Admitting: Physical Medicine & Rehabilitation

## 2009-09-12 ENCOUNTER — Ambulatory Visit: Payer: Self-pay | Admitting: Physical Medicine & Rehabilitation

## 2009-09-17 ENCOUNTER — Telehealth (INDEPENDENT_AMBULATORY_CARE_PROVIDER_SITE_OTHER): Payer: Self-pay | Admitting: Internal Medicine

## 2009-09-17 ENCOUNTER — Encounter (INDEPENDENT_AMBULATORY_CARE_PROVIDER_SITE_OTHER): Payer: Self-pay | Admitting: Internal Medicine

## 2009-09-25 ENCOUNTER — Encounter (INDEPENDENT_AMBULATORY_CARE_PROVIDER_SITE_OTHER): Payer: Self-pay | Admitting: Internal Medicine

## 2009-10-02 ENCOUNTER — Encounter (INDEPENDENT_AMBULATORY_CARE_PROVIDER_SITE_OTHER): Payer: Self-pay | Admitting: Internal Medicine

## 2009-10-11 ENCOUNTER — Encounter (INDEPENDENT_AMBULATORY_CARE_PROVIDER_SITE_OTHER): Payer: Self-pay | Admitting: Internal Medicine

## 2009-10-16 ENCOUNTER — Ambulatory Visit: Payer: Self-pay | Admitting: Physical Medicine & Rehabilitation

## 2009-10-22 ENCOUNTER — Ambulatory Visit: Payer: Self-pay | Admitting: Internal Medicine

## 2009-10-22 DIAGNOSIS — H6690 Otitis media, unspecified, unspecified ear: Secondary | ICD-10-CM | POA: Insufficient documentation

## 2009-10-22 LAB — CONVERTED CEMR LAB: Blood Glucose, Fingerstick: 104

## 2009-10-23 ENCOUNTER — Encounter (INDEPENDENT_AMBULATORY_CARE_PROVIDER_SITE_OTHER): Payer: Self-pay | Admitting: Internal Medicine

## 2009-10-23 ENCOUNTER — Telehealth: Payer: Self-pay | Admitting: Physician Assistant

## 2009-10-27 ENCOUNTER — Emergency Department (HOSPITAL_COMMUNITY): Admission: EM | Admit: 2009-10-27 | Discharge: 2009-10-27 | Payer: Self-pay | Admitting: Emergency Medicine

## 2009-11-05 ENCOUNTER — Telehealth (INDEPENDENT_AMBULATORY_CARE_PROVIDER_SITE_OTHER): Payer: Self-pay | Admitting: Nurse Practitioner

## 2009-11-06 ENCOUNTER — Encounter (INDEPENDENT_AMBULATORY_CARE_PROVIDER_SITE_OTHER): Payer: Self-pay | Admitting: Internal Medicine

## 2009-11-24 ENCOUNTER — Telehealth (INDEPENDENT_AMBULATORY_CARE_PROVIDER_SITE_OTHER): Payer: Self-pay | Admitting: Internal Medicine

## 2009-12-02 ENCOUNTER — Encounter (INDEPENDENT_AMBULATORY_CARE_PROVIDER_SITE_OTHER): Payer: Self-pay | Admitting: Internal Medicine

## 2009-12-02 ENCOUNTER — Ambulatory Visit (HOSPITAL_COMMUNITY): Admission: RE | Admit: 2009-12-02 | Discharge: 2009-12-02 | Payer: Self-pay | Admitting: Neurosurgery

## 2009-12-02 HISTORY — PX: CARPAL TUNNEL RELEASE: SHX101

## 2009-12-11 ENCOUNTER — Telehealth (INDEPENDENT_AMBULATORY_CARE_PROVIDER_SITE_OTHER): Payer: Self-pay | Admitting: Internal Medicine

## 2009-12-12 ENCOUNTER — Encounter (INDEPENDENT_AMBULATORY_CARE_PROVIDER_SITE_OTHER): Payer: Self-pay | Admitting: Internal Medicine

## 2009-12-12 ENCOUNTER — Emergency Department (HOSPITAL_COMMUNITY)
Admission: EM | Admit: 2009-12-12 | Discharge: 2009-12-13 | Payer: Self-pay | Source: Home / Self Care | Admitting: Emergency Medicine

## 2009-12-14 ENCOUNTER — Emergency Department (HOSPITAL_COMMUNITY)
Admission: EM | Admit: 2009-12-14 | Discharge: 2009-12-14 | Payer: Self-pay | Source: Home / Self Care | Admitting: Emergency Medicine

## 2009-12-15 ENCOUNTER — Encounter (INDEPENDENT_AMBULATORY_CARE_PROVIDER_SITE_OTHER): Payer: Self-pay | Admitting: Internal Medicine

## 2009-12-19 ENCOUNTER — Encounter (INDEPENDENT_AMBULATORY_CARE_PROVIDER_SITE_OTHER): Payer: Self-pay | Admitting: Internal Medicine

## 2009-12-23 ENCOUNTER — Encounter (INDEPENDENT_AMBULATORY_CARE_PROVIDER_SITE_OTHER): Payer: Self-pay | Admitting: Internal Medicine

## 2009-12-23 ENCOUNTER — Telehealth (INDEPENDENT_AMBULATORY_CARE_PROVIDER_SITE_OTHER): Payer: Self-pay | Admitting: Internal Medicine

## 2009-12-24 ENCOUNTER — Telehealth (INDEPENDENT_AMBULATORY_CARE_PROVIDER_SITE_OTHER): Payer: Self-pay | Admitting: Internal Medicine

## 2010-01-01 ENCOUNTER — Encounter (INDEPENDENT_AMBULATORY_CARE_PROVIDER_SITE_OTHER): Payer: Self-pay | Admitting: Internal Medicine

## 2010-01-08 ENCOUNTER — Ambulatory Visit: Payer: Self-pay | Admitting: Internal Medicine

## 2010-01-08 LAB — CONVERTED CEMR LAB
Blood Glucose, Fingerstick: 125
Hgb A1c MFr Bld: 5.7 %

## 2010-01-09 ENCOUNTER — Encounter (INDEPENDENT_AMBULATORY_CARE_PROVIDER_SITE_OTHER): Payer: Self-pay | Admitting: Internal Medicine

## 2010-01-11 DIAGNOSIS — Z87448 Personal history of other diseases of urinary system: Secondary | ICD-10-CM

## 2010-01-11 HISTORY — DX: Personal history of other diseases of urinary system: Z87.448

## 2010-02-01 ENCOUNTER — Encounter: Payer: Self-pay | Admitting: Specialist

## 2010-02-01 ENCOUNTER — Encounter: Payer: Self-pay | Admitting: Internal Medicine

## 2010-02-10 NOTE — Letter (Signed)
Summary: REQUESTED R ECORDS MAILED 04/24/07  REQUESTED R ECORDS MAILED 04/24/07   Imported By: Silvio Pate Stanislawscyk 04/20/2007 14:34:50  _____________________________________________________________________  External Attachment:    Type:   Image     Comment:   External Document

## 2010-02-10 NOTE — Assessment & Plan Note (Signed)
Summary: Broncitis   History of Present Illness:  Michelle [name] into the office with complaints of SOB. She went to the ER on 10/19/07 and was Dx with Broncitis.  She was prescribed Prednisone and MDI however Michelle [name] reports that she did not get the meds due to finances. Breathing has progressively worsened. Tobacco use continues but she has not been able to smoke over the past 2 days.   Review of Systems  Resp      Complains of cough, shortness of breath, and wheezing.   Physical Exam  General:     ill appearing Lungs:     labored breathing with accessory muscle use Decreased air movement with bil wheezing and scattered rhonchi Heart:     normal rate and regular rhythm.   Neurologic:     gait normal.   Psych:     anxious   Impression & Recommendations:  Problem # 1:  ACUTE BRONCHITIS (ICD-466.0) Albuterol/atrovent neb given in office Depomedrol 80mg  IM x 1 Rx given to Michelle [name] but again she notes some financial constraints.  Advised Michelle [name] that she will need to get meds symptoms will continue  Proventil sample given Her updated medication list for this problem includes:    Zithromax 250 Mg Tabs (Azithromycin) .Marland Kitchen... Z-pack - dispense as directed    Proventil Hfa 108 (90 Base) Mcg/act Aers (Albuterol sulfate) .Marland Kitchen... 2 puffs every 6 hours as needed for shortness of breath  Orders: Pulse Oximetry (single measurment) (94760) Nebulizer Tx ([address])   Complete Medication List: 1)  Diflucan 100 Mg Tabs (Fluconazole) .... 2 tabs by mouth today, then 1 tab by mouth daily for 14 days. 2)  Citalopram Hydrobromide 20 Mg Tabs (Citalopram hydrobromide) .Marland Kitchen.. 1 tab by mouth daily 3)  Diazepam 2 Mg Tabs (Diazepam) .Marland Kitchen.. 1-2 by mouth by mouth once daily as needed anxiety 4)  Zithromax 250 Mg Tabs (Azithromycin) .... Z-pack - dispense as directed 5)  Prednisone 10 Mg Tabs (Prednisone) .... Taper from 60mg  to 0 mg over 7 days 6)  Proventil Hfa 108 (90 Base) Mcg/act Aers (Albuterol sulfate) .... 2 puffs every 6  hours as needed for shortness of breath  Medications Added ZITHROMAX 250 MG TABS (AZITHROMYCIN) Z-pack - dispense as directed PREDNISONE 10 MG TABS (PREDNISONE) taper from 60mg  to 0 mg over 7 days PROVENTIL HFA 108 (90 BASE) MCG/ACT AERS (ALBUTEROL SULFATE) 2 puffs every 6 hours as needed for shortness of breath       Nurse Visit   Vital Signs:  Patient Profile:   45 Years Old Female O2 Sat:      83 % O2 treatment:    Room Air Temp:     97.9 degrees F  Vitals Entered By: Vesta Mixer CMA (October 23, 2007 3:33 PM)             Comments Michelle [name] walked in c/o not being able to breath.  Taken to Room One.  Pulse Ox 83% on room air.  Michelle [name] went to ED over the weekend and dx with bronchitis, but unable to get medications secondary to cost.  Albuterol/Atrovent neb given per Jesse Fall FNP.  Also Depo Medrol 80mg .  Post O2 88%.     Prior Medications: DIFLUCAN 100 MG  TABS (FLUCONAZOLE) 2 tabs by mouth today, then 1 tab by mouth daily for 14 days. CITALOPRAM HYDROBROMIDE 20 MG  TABS (CITALOPRAM HYDROBROMIDE) 1 tab by mouth daily DIAZEPAM 2 MG  TABS (DIAZEPAM) 1-2 by mouth by mouth once daily as needed anxiety Current  Allergies: ! CODEINE    Medication Administration  Injection # 1:    Medication: Depo- Medrol 80mg     Route: IM    Site: RUOQ gluteus    Exp Date: 06/11/2009    Lot #: Wendall Stade    Mfr: [employer]    Comments: [id number]    Patient tolerated injection without complications    Given by: Vesta Mixer CMA (October 23, 2007 3:36 PM)  Orders Added: 1)  Est. Patient Level II [78295] 2)  Pulse Oximetry (single measurment) [94760] 3)  Nebulizer Tx [62130]   Patient Instructions: 1)  Prednisone taper 2)  Tuesday 60mg  (6 tabs) 3)  Wednesday 50mg  (5 tabs) 4)  Thursday 40mg  (4 tabs) 5)  Friday 30mg  (3 tabs) 6)  Saturday 20mg  (2 tabs) 7)  Sunday 10mg (1 tab) 8)  Azithromycin 9)  Tuesday 2 tablets 10)  Wednesday 1 tablet 11)  Thursday 1 tablet 12)  Friday 1  tablet 13)  Saturday 1 tablet 14)  Follow up in 1 week or sooner if symptoms worsen    Prescriptions: PROVENTIL HFA 108 (90 BASE) MCG/ACT AERS (ALBUTEROL SULFATE) 2 puffs every 6 hours as needed for shortness of breath  #1 x 0   Entered and Authorized by:   Nykedtra Martin FNP   Signed by:   Nykedtra Martin FNP on 10/24/2007   Method used:   Samples Given   RxID:   [id number] PROVENTIL HFA 108 (90 BASE) MCG/ACT AERS (ALBUTEROL SULFATE) 2 puffs every 6 hours as needed for shortness of breath  #1 x 1   Entered and Authorized by:   Nykedtra Martin FNP   Signed by:   Nykedtra Martin FNP on 10/23/2007   Method used:   Print then Give to Patient   RxID:   [id number] PREDNISONE 10 MG TABS (PREDNISONE) taper from 60mg to 0 mg over 7 days  #21 x 0   Entered and Authorized by:   Nykedtra Martin FNP   Signed by:   Nykedtra Martin FNP on 10/23/2007   Method used:   Print then Give to Patient   RxID:   [id number] ZITHROMAX 250 MG TABS (AZITHROMYCIN) Z-pack - dispense as directed  #6 x 0   Entered and Authorized by:   Nykedtra Martin FNP   Signed by:   Nykedtra Martin FNP on 10/23/2007   Method used:   Print then Give to Patient   RxID:   [id number]  ]  Vital Signs:  Patient Profile:   45 Years Old Female O2 Sat:      83 % Temp:     97 .9 degrees F                    CXR  Procedure date:  10/19/2007  Findings:      Streaky bibasilar opacities consistent with chronic bronchitis.  Some focality in the left lower lobe may represent superimposed pneumonia

## 2010-02-10 NOTE — Letter (Signed)
Summary: [facility]  [facility]   Imported By: Arta Bruce 07/31/2008 12:42:38  _____________________________________________________________________  External Attachment:    Type:   Image     Comment:   External Document

## 2010-02-10 NOTE — Letter (Signed)
Summary: [facility]  [facility]   Imported By: Arta Bruce 10/02/2009 10:41:10  _____________________________________________________________________  External Attachment:    Type:   Image     Comment:   External Document

## 2010-02-10 NOTE — Letter (Signed)
Summary: [facility]  [facility]   Imported By: Arta Bruce 10/02/2009 10:42:06  _____________________________________________________________________  External Attachment:    Type:   Image     Comment:   External Document

## 2010-02-10 NOTE — Progress Notes (Signed)
Summary: REFILL ON  VALIUM   Phone Note Call from Patient Call back at Home Phone [phone]   Reason for Call: Refill Medication Summary of Call: Michelle [name] . MS Shellenbarger WANTS TO KNOW IF SHE CAN GET A REFILL ON HER VALIUM. SHE USES [facility] [address]. Initial call taken by: Leodis Rains,  August 19, 2009 3:06 PM  Follow-up for Phone Call        Pt is scheduled to be seen this Friday.  She has been told she must be seen before getting meds.  Dr. Delrae Alfred aware. Follow-up by: Vesta Mixer CMA,  August 19, 2009 3:44 PM

## 2010-02-10 NOTE — Letter (Signed)
Summary: FAXED REQUESTED RECORDS TO [facility]  FAXED REQUESTED RECORDS TO [facility]   Imported By: Arta Bruce 04/04/2007 10:47:59  _____________________________________________________________________  External Attachment:    Type:   Image     Comment:   External Document

## 2010-02-10 NOTE — Assessment & Plan Note (Signed)
Summary: Right Otitis Media   Vital Signs:  Patient profile:   45 year old female Menstrual status:  irregular Height:      69.5 inches Weight:      289.5 pounds Temp:     98.8 degrees F oral Pulse rate:   104 / minute Pulse rhythm:   regular Resp:     18 per minute BP sitting:   100 / 76  (left arm) Cuff size:   large  Vitals Entered By: Armenia Shannon (October 22, 2009 3:27 PM) CC: c/o cough, decrease in energy, chest congestion, ha, body aches, greenish mucus x 4 days. no relief with OTC meds Is Patient Diabetic? Yes Pain Assessment Patient in pain? yes      Intensity: 8 CBG Result 104  Does patient need assistance? Functional Status Self care Ambulation Normal   CC:  c/o cough, decrease in energy, chest congestion, ha, body aches, and greenish mucus x 4 days. no relief with OTC meds.  History of Present Illness: Feeling sick for about a week.  Worse over weekend.  Cough . . . sputum is green.  No hemoptysis.  Copious amounts.  Nasal discharge is green.  Sputum color consistent throughout the day.  Fatigued.  No appetite.  Taking over the counter sinus/cold medicine and antihistamine and nighttime med (Nyquil).  Thinks she may have a fever . Marland Kitchen never tested.  + chills.  + headache . . . points to frontal sinus area.  Coughing/valsalva makes worse.  Having stress incontinence.  No sore throat.  + right otalgia.  No vomiting or diarrhea.  No rashes.  No other recent illnesses.    Habits & Providers  Alcohol-Tobacco-Diet     Tobacco Status: current     Cigarette Packs/Day: 0.5  Problems Prior to Update: 1)  Psoriasis  (ICD-696.1) 2)  Onychomycosis, Bilateral  (ICD-110.1) 3)  Carpal Tunnel Syndrome, Bilateral  (ICD-354.0) 4)  Diabetes Mellitus, Type II, Controlled  (ICD-250.00) 5)  Hyperglycemia  (ICD-790.29) 6)  Bronchitis, Chronic  (ICD-491.9) 7)  Peripheral Edema  (ICD-782.3) 8)  Menopausal Syndrome  (ICD-627.2) 9)  Tobacco Abuse  (ICD-305.1) 10)  Pruritus, Ears   (ICD-698.9) 11)  Obesity  (ICD-278.00) 12)  Otitis Externa  (ICD-380.10) 13)  Neck Pain  (ICD-723.1) 14)  Vaginitis, Candidal  (ICD-112.1) 15)  Health Maintenance Exam  (ICD-V70.0) 16)  Routine Gynecological Examination  (ICD-V72.31) 17)  Disc Disease, Lumbosacral Spine  (ICD-722.52) 18)  Acute Bronchitis  (ICD-466.0) 19)  Degenerative Joint Disease, Left Knee  (ICD-715.96) 20)  Intertrigo  (ICD-695.89) 21)  Depression  (ICD-311) 22)  Anxiety  (ICD-300.00) 23)  Candidiasis, Skin  (ICD-112.3)  Allergies (verified): 1)  ! Codeine 2)  ! Penicillin  Past History:  Past Medical History: Last updated: 04/26/2008 DISC DISEASE, LUMBOSACRAL SPINE (ICD-722.52) ACUTE BRONCHITIS (ICD-466.0) DEGENERATIVE JOINT DISEASE, LEFT KNEE (ICD-715.96) INTERTRIGO (ICD-695.89) DEPRESSION (ICD-311) ANXIETY (ICD-300.00) CANDIDIASIS, SKIN (ICD-112.3)  Social History: Packs/Day:  0.5  Physical Exam  General:  alert, well-developed, and well-nourished.   Head:  normocephalic and atraumatic.   Eyes:  pupils equal, pupils round, pupils reactive to light, and no injection.   Ears:  L ear normal, R TM erythema, and R TM bulging.   Nose:  no external deformity.   Mouth:  pharynx pink and moist, no erythema, and no exudates.   Neck:  supple and no cervical lymphadenopathy.   Lungs:  normal breath sounds, no crackles, and no wheezes.   Heart:  normal rate and regular rhythm.  Neurologic:  alert & oriented X3 and cranial nerves II-XII intact.   Psych:  normally interactive.     Impression & Recommendations:  Problem # 1:  OTITIS MEDIA, ACUTE, RIGHT (ICD-382.9) Assessment New mucinex d otc flonase tessalon perles zpak (PCN allergic) f/u as needed  Her updated medication list for this problem includes:    Azithromycin 250 Mg Tabs (Azithromycin) .Marland Kitchen... Take 2 tabs by mouth today, then starting tomorrow, take 1 by mouth once daily  Complete Medication List: 1)  Neurontin 300 Mg Caps (Gabapentin)  .... 2 caps in am, 1 in afternoon and 2 caps by mouth in evening.  [facility]--depression.  dr. Gaston Islam brown 2)  Cymbalta 60 Mg Cpep (Duloxetine hcl) .Marland Kitchen.. 1 by mouth once daily  dr. Gaston Islam brown-[facility].  also see ann miller 3)  Furosemide 20 Mg Tabs (Furosemide) .Marland Kitchen.. 1 tab by mouth in morning 4)  Klor-con 10 10 Meq Cr-tabs (Potassium chloride) .Marland Kitchen.. 1 tab by mouth daily 5)  Ventolin Hfa 108 (90 Base) Mcg/act Aers (Albuterol sulfate) .... 2 puffs every 4 hours as needed for wheeze. 6)  Nicotine 21 Mg/24hr Pt24 (Nicotine) .... Apply topically to skin daily for smoking 7)  Methocarbamol 750 Mg Tabs (Methocarbamol) .Marland Kitchen.. 1 by mouth 4 times daily as needed for back pain and right leg pain 8)  Trazodone Hcl 50 Mg Tabs (Trazodone hcl) .... 1/2 -1 by mouth at bedtime as needed-[facility]--dr. loyce brown. 9)  Famotidine 40 Mg Tabs (Famotidine) .Marland Kitchen.. 1 tab by mouth at bedtime daily 10)  Azithromycin 250 Mg Tabs (Azithromycin) .... Take 2 tabs by mouth today, then starting tomorrow, take 1 by mouth once daily 11)  Tessalon Perles 100 Mg Caps (Benzonatate) .... Take 1 capsule by mouth three times a day as needed for cough 12)  Fluticasone Propionate 50 Mcg/act Susp (Fluticasone propionate) .... 2 sprays each nostril once daily for 2 weeks  Patient Instructions: 1)  You can get Mucinex-D over the counter and take two times a day for 3-5 days to help with congestion and cough. 2)  Take the Azithromycin as directed until all gone. 3)  Use the Tessalon Perles as needed for cough. 4)  Use the nose spray for 2 weeks and stop. 5)  You can use saline nose spray (at a different time than the prescription nose spray) as needed. 6)  Get plenty of rest. Drink plenty of fluids.  Use tylenol as needed for pain or fever. 7)  Return for follow up if you are no better in one week or sooner if you are worse. Prescriptions: FLUTICASONE PROPIONATE 50 MCG/ACT SUSP (FLUTICASONE PROPIONATE) 2  sprays each nostril once daily for 2 weeks  #1 x 0   Entered and Authorized by:   Tereso Newcomer PA-C   Signed by:   Tereso Newcomer PA-C on 10/22/2009   Method used:   Print then Give to Patient   RxID:   [id number] TESSALON PERLES 100 MG CAPS (BENZONATATE) Take 1 capsule by mouth three times a day as needed for cough  #30 x 0   Entered and Authorized by:   Tereso Newcomer PA-C   Signed by:   Tereso Newcomer PA-C on 10/22/2009   Method used:   Print then Give to Patient   RxID:   [id number] AZITHROMYCIN 250 MG TABS (AZITHROMYCIN) Take 2 tabs by mouth today, then starting tomorrow, take 1 by mouth once daily  #6 x 0   Entered and Authorized by:  Tereso Newcomer PA-C   Signed by:   Tereso Newcomer PA-C on 10/22/2009   Method used:   Print then Give to Patient   RxID:   [id number]

## 2010-02-10 NOTE — Progress Notes (Signed)
Summary: [facility] CALLED/alternate number   Phone Note Call from Patient   Caller: LAURIE AT [facility] Summary of Call: [name] SPOKE WITH LAURIE AT [facility], AND SHE SAYS THAT NANETTER HAS CALLED HER THIS MORNING, SCREAMING AND CRYING. LAURIE SAYS THAT SHE IS DRUG SEEKING, AND THAT SHE HAD A PAIN CONTRACT WITH DR. Ethelene Hal AND SHE HAS VIOLATED THAT AND [facility]. HAS CUT HER OFF ON HER PAIN MEDS FROM THERE OFFICE. Initial call taken by: Leodis Rains,  April 19, 2008 11:47 AM  Follow-up for Phone Call        Thank you.  We have repeatdly tried to get records from Dr.Brooks, but nothing so far.  Pt is scheduled for a cpp on next Friday. Follow-up by: Vesta Mixer CMA,  April 19, 2008 3:48 PM  Additional Follow-up for Phone Call Additional follow up Details #1::        Is Dr. Shon Baton with [facility]?  If so, are they going to send the records?  Julieanne Manson MD  April 25, 2008 5:30 PM     Additional Follow-up for Phone Call Additional follow up Details #2::    Yes they are in the same office  left another msg for Joy at [facility].  Records in will put in your office. Follow-up by: Vesta Mixer CMA,  April 26, 2008 12:13 PM  Additional Follow-up for Phone Call Additional follow up Details #3:: Details for Additional Follow-up Action Taken: Records from Dr. Shon Baton' office evaluated.  He did feel her back pain may be exacerbated by her breasts.  Will refer pt. to Dr. Shon Hough and see what he feels.  Please notify pt. of referral.  Julieanne Manson MD  May 08, 2008 11:47 AM   Pt aware and she gave an alternate number to reach her at [phone] just in case her phone is off. Additional Follow-up by: Vesta Mixer CMA,  May 08, 2008 3:40 PM

## 2010-02-10 NOTE — Letter (Signed)
Summary: [facility]   Imported By: Arta Bruce 10/02/2009 10:40:02  _____________________________________________________________________  External Attachment:    Type:   Image     Comment:   External Document

## 2010-02-10 NOTE — Letter (Signed)
Summary: *Referral Letter  [facility]  [address] [place], [place] [address]   Phone: [phone]  Fax: [phone]    04/08/2009  Thank you in advance for agreeing to see my patient:  Michelle [name] [address] [place], [place]  [address]  Phone: [phone]  Reason for Referral: Right low back pain with radicular symptoms.  Hx of L5/S1 discectomy and fixation in past.  Is involved with a pain clinic already if no other treatment recommended.  Procedures Requested: Evaluation and recommendations--pt. very insistent she be seen by ortho regarding this.  Current Medical Problems: 1)  BRONCHITIS, CHRONIC (ICD-491.9) 2)  PERIPHERAL EDEMA (ICD-782.3) 3)  MENOPAUSAL SYNDROME (ICD-627.2) 4)  TOBACCO ABUSE (ICD-305.1) 5)  PRURITUS, EARS (ICD-698.9) 6)  OBESITY (ICD-278.00) 7)  OTITIS EXTERNA (ICD-380.10) 8)  NECK PAIN (ICD-723.1) 9)  VAGINITIS, CANDIDAL (ICD-112.1) 10)  HEALTH MAINTENANCE EXAM (ICD-V70.0) 11)  ROUTINE GYNECOLOGICAL EXAMINATION (ICD-V72.31) 12)  DISC DISEASE, LUMBOSACRAL SPINE (ICD-722.52) 13)  ACUTE BRONCHITIS (ICD-466.0) 14)  DEGENERATIVE JOINT DISEASE, LEFT KNEE (ICD-715.96) 15)  INTERTRIGO (ICD-695.89) 16)  DEPRESSION (ICD-311) 17)  ANXIETY (ICD-300.00) 18)  CANDIDIASIS, SKIN (ICD-112.3)   Current Medications: 1)  NEURONTIN 300 MG CAPS (GABAPENTIN) 2 caps in am, 1 in afternoon and 2 caps by mouth in evening.  [facility]--depression. 2)  CYMBALTA 60 MG CPEP (DULOXETINE HCL) 1 by mouth once daily  Dr. Janne Napoleon for Major Depression.  Also see Zenia Resides 3)  FAMOTIDINE 40 MG TABS (FAMOTIDINE) 1 tab by mouth daily as needed heartburn 4)  CORTISPORIN-TC 3.03-13-08-0.5 MG/ML SUSP (NEOMYCIN-COLIST-HC-THONZONIUM) 3-4 drops right ear qid for 5 days. 5)  VISTARIL 25 MG CAPS (HYDROXYZINE PAMOATE) 1 cap by mouth daily as needed anxiety 6)  KETOCONAZOLE 2 % CREA (KETOCONAZOLE) Apply two times a day to affected area for 2  weeks 7)  PERCOCET 10-325 MG TABS (OXYCODONE-ACETAMINOPHEN) 1 tab by mouth three times a day --[facility]--Dr. Dagwa 8)  ZANAFLEX 4 MG CAPS (TIZANIDINE HCL) 1 cap by mouth every 12 hours--pain clinic 9)  XANAX 0.5 MG TABS (ALPRAZOLAM) 1 tab by mouth at bedtime--pain clinic 10)  FUROSEMIDE 20 MG TABS (FUROSEMIDE) 1 tab by mouth in morning 11)  K-TABS 10 MEQ CR-TABS (POTASSIUM CHLORIDE) 1 tab by mouth by mouth daily when take Furosemide 12)  VENTOLIN HFA 108 (90 BASE) MCG/ACT AERS (ALBUTEROL SULFATE) 2 puffs every 4 hours as needed for wheeze.   Past Medical History: 1)  DISC DISEASE, LUMBOSACRAL SPINE (ICD-722.52) 2)  ACUTE BRONCHITIS (ICD-466.0) 3)  DEGENERATIVE JOINT DISEASE, LEFT KNEE (ICD-715.96) 4)  INTERTRIGO (ICD-695.89) 5)  DEPRESSION (ICD-311) 6)  ANXIETY (ICD-300.00) 7)  CANDIDIASIS, SKIN (ICD-112.3)   Prior History of Blood Transfusions:   Pertinent Labs:    Thank you again for agreeing to see our patient; please contact us if you have any further questions or need additional information.  Sincerely,  Julieanne Manson MD

## 2010-02-10 NOTE — Letter (Signed)
Summary: [facility]  [facility]   Imported By: Arta Bruce 05/29/2009 10:29:53  _____________________________________________________________________  External Attachment:    Type:   Image     Comment:   External Document

## 2010-02-10 NOTE — Letter (Signed)
Summary: [facility]  [facility]   Imported By: Arta Bruce 07/31/2008 12:35:20  _____________________________________________________________________  External Attachment:    Type:   Image     Comment:   External Document

## 2010-02-10 NOTE — Assessment & Plan Note (Signed)
Summary: WEIGHT SUPPLIMENT//KT   Vital Signs:  Patient profile:   45 year old female Height:      68 inches Weight:      277 pounds BMI:     42.27 Temp:     97.4 degrees F oral Pulse rate:   61 / minute Pulse rhythm:   regular Resp:     18 per minute BP sitting:   118 / 81  (right arm) Cuff size:   large  Vitals Entered By: [place] Shannon (July 26, 2008 11:03 AM) CC: pt would like to find a weight supplement..... her therpist wants her on visteril Is Patient Diabetic? No Pain Assessment Patient in pain? no       Does patient need assistance? Functional Status Self care Ambulation Normal   CC:  pt would like to find a weight supplement..... her therpist wants her on visteril.  History of Present Illness: 1.  Weight Loss:  Dr. Shon Hough recommended 25 lbs weight loss in next 3 months.  Has lost 12 lbs since last visit.  2.  Depression and Anxiety:  Clement Sayres at [facility] recommended trying Vistaril in between other meds.  Mother stresses her out.  Trying to get Disability.  Has gotten into trouble and has court fines.  If gets Disability, plans to take care of court problems and leave the area.  3.  Back pain:  Wants pain meds.  Low back pain and radiating down right leg.  Since last here, has had evaluation of left foot after slipped on apple--states she injured heel and broke second toe.  Feels Percocet helps best.  Previously, followed by Dr. Shon Baton.  Finds she needs something 2-3 times daily.  Current Medications (verified): 1)  Neurontin 300 Mg Caps (Gabapentin) .... 2 By Mouth Two Times A Day 2)  Cymbalta 60 Mg Cpep (Duloxetine Hcl) .Marland Kitchen.. 1 By Mouth Once Daily  Dr. Allyne Gee 3)  Famotidine 40 Mg Tabs (Famotidine) .Marland Kitchen.. 1 Tab By Mouth Daily As Needed Heartburn 4)  Cortisporin-Tc 3.03-13-08-0.5 Mg/ml Susp (Neomycin-Colist-Hc-Thonzonium) .... 3-4 Drops Right Ear Qid For 5 Days.  Allergies (verified): 1)  ! Codeine  Physical Exam  Msk:  Tender over L/S spinous  processes and right paraspinous musculature.  Straight leg raise equivocal Neurologic:  DTRs symmetrical and normal.     Impression & Recommendations:  Problem # 1:  DISC DISEASE, LUMBOSACRAL SPINE (ICD-722.52) Pain contract--discussed all pain meds would be discontinued if obtaining from another source--ED included. Will not go over 60 of Percocet per month  Problem # 2:  OBESITY (ICD-278.00) Will call Dr. Charlesetta Garibaldi office and ask what specifically they would like for pt. to take. Pt. losing weight well with current lifestyle changes and do not feel weight loss meds and possible side effects warranted. Pt. will let me know when back in [place]--temporarily living with mother to go to [facility].  Problem # 3:  ANXIETY (ICD-300.00)  Her updated medication list for this problem includes:    Cymbalta 60 Mg Cpep (Duloxetine hcl) .Marland Kitchen... 1 by mouth once daily  dr. Allyne Gee    Vistaril 25 Mg Caps (Hydroxyzine pamoate) .Marland Kitchen... 1 cap by mouth daily as needed anxiety  Complete Medication List: 1)  Neurontin 300 Mg Caps (Gabapentin) .... 2 caps in am, 1 in afternoon and 2 caps by mouth in evening. 2)  Cymbalta 60 Mg Cpep (Duloxetine hcl) .Marland Kitchen.. 1 by mouth once daily  dr. Allyne Gee 3)  Famotidine 40 Mg Tabs (Famotidine) .Marland Kitchen.. 1 tab by  mouth daily as needed heartburn 4)  Cortisporin-tc 3.03-13-08-0.5 Mg/ml Susp (Neomycin-colist-hc-thonzonium) .... 3-4 drops right ear qid for 5 days. 5)  Percocet 5-325 Mg Tabs (Oxycodone-acetaminophen) .Marland Kitchen.. 1 tab by mouth two times a day 6)  Vistaril 25 Mg Caps (Hydroxyzine pamoate) .Marland Kitchen.. 1 cap by mouth daily as needed anxiety  Patient Instructions: 1)  Call for Nutrition appt. Prescriptions: VISTARIL 25 MG CAPS (HYDROXYZINE PAMOATE) 1 cap by mouth daily as needed anxiety  #15 x 0   Entered and Authorized by:   Julieanne Manson MD   Signed by:   Julieanne Manson MD on 07/26/2008   Method used:   Print then Give to Patient   RxID:   [id number] PERCOCET  5-325 MG TABS (OXYCODONE-ACETAMINOPHEN) 1 tab by mouth two times a day  #60 x 0   Entered and Authorized by:   Julieanne Manson MD   Signed by:   Julieanne Manson MD on 07/26/2008   Method used:   Print then Give to Patient   RxID:   [id number]

## 2010-02-10 NOTE — Progress Notes (Signed)
Summary: REFILL REQUESt  Phone Note Call from Patient   Caller: Patient Reason for Call: Refill Medication Summary of Call: needs a refills on percocet Initial call taken by: Oscar La,  September 17, 2009 12:08 PM  Follow-up for Phone Call        last written on 08/22/09  FWD to [facility] Follow-up by: Michelle Nasuti,  September 18, 2009 11:05 AM  Additional Follow-up for Phone Call Additional follow up Details #1::        She was supposed to be seen by pain clinic in one month last time she was seen--has she not started with them yet?  If not, why? Additional Follow-up by: Julieanne Manson MD,  September 19, 2009 3:02 PM    Additional Follow-up for Phone Call Additional follow up Details #2::    pt did see pain clinic 09/12/09. Pt will not get rx from that office until UDS comes back. will be scheduled for epidural injections.  Follow-up by: Michelle Nasuti,  September 19, 2009 3:19 PM  Additional Follow-up for Phone Call Additional follow up Details #3:: Details for Additional Follow-up Action Taken: Will fill 2 weeks only and then no further pain med refills from this office  Julieanne Manson MD  September 19, 2009 3:42 PM   Prescriptions: OXYCODONE-ACETAMINOPHEN 5-325 MG TABS (OXYCODONE-ACETAMINOPHEN) 1 tab by mouth two times a day as needed pain  #30 x 0   Entered and Authorized by:   Julieanne Manson MD   Signed by:   Julieanne Manson MD on 09/19/2009   Method used:   Print then Give to Patient   RxID:   [id number]

## 2010-02-10 NOTE — Assessment & Plan Note (Signed)
Summary: F/U O V/ CHK BLOOD HAS MERCR/GK    Vital Signs:  Patient Profile:   45 Years Old Female LMP:     02/19/2007 Weight:      291 pounds Temp:     97.2 degrees F Pulse rate:   76 / minute Pulse rhythm:   regular Resp:     20 per minute BP sitting:   116 / 76  (left arm) Cuff size:   large  Pt. in pain?   yes    Location:   back/knee    Intensity:   6-7  Vitals Entered By: Vesta Mixer CMA (March 21, 2007 11:36 AM)  Menstrual History: LMP (date): 02/19/2007              Is Patient Diabetic? No  Does patient need assistance? Ambulation Normal Comments just taking celexa right now     Chief Complaint:  wants her blood checked because she has been feelin tired and wants something else for her neves.  Doesn't think the diflucan helped still c/o yeast inf..  History of Present Illness: 1. Still with intertriginous skin rash--pt. states no better, but unclear if had any change as pt. did not follow up as asked.  Took 2 weeks of Fluconazole  2.  Depression/anxiety/panic disorder:  Celexa has helped, but pt. with ongoing anxieties in household and having intermittent panic attacks.  Planning to go to [employer] need to pursue disability.  Not receiving any ongoing counseling currently.    Current Allergies: ! CODEINE      Physical Exam  Skin:     intertriginous rash appears less red, but still with signifcant involvement--more macerated in groin area, but not as wet.  Scrapings of rash under breasts taken    Impression & Recommendations:  Problem # 1:  CANDIDIASIS, SKIN (ICD-112.3) Discussed need to follow up as asked Orders: T- * Misc. Laboratory test (99999)--skin scrapings for KOH and fungal culture  Orders: T- * Misc. Laboratory test [phone])   Problem # 2:  ANXIETY (ICD-300.00) Pt. going to [place]--no further Diazepam from this office. Phone number for [facility] counseling given. Her updated medication list for this  problem includes:    Citalopram Hydrobromide 20 Mg Tabs (Citalopram hydrobromide) .Marland Kitchen... 1 tab by mouth daily    Diazepam 2 Mg Tabs (Diazepam) .Marland Kitchen... 1-2 by mouth by mouth once daily as needed anxiety   Complete Medication List: 1)  Diflucan 100 Mg Tabs (Fluconazole) .... 2 tabs by mouth today, then 1 tab by mouth daily for 14 days. 2)  Citalopram Hydrobromide 20 Mg Tabs (Citalopram hydrobromide) .Marland Kitchen.. 1 tab by mouth daily 3)  Diazepam 2 Mg Tabs (Diazepam) .Marland Kitchen.. 1-2 by mouth by mouth once daily as needed anxiety   Patient Instructions: 1)  Family Services  [phone] for counseling    Prescriptions: DIAZEPAM 2 MG  TABS (DIAZEPAM) 1-2 by mouth by mouth once daily as needed anxiety  #20 x 0   Entered and Authorized by:   Julieanne Manson MD   Signed by:   Julieanne Manson MD on 03/21/2007   Method used:   Print then Give to Patient   RxID:   [id number]  ]

## 2010-02-10 NOTE — Progress Notes (Signed)
Summary: NEEDS REFILL ON PAIN MEDS   Phone Note Call from Patient Call back at [phone]- [phone]   Reason for Call: Refill Medication Summary of Call: [name] MS Monroy IS CALLING IN FOR HER PERCOCET, AND SHE WANTS TO KNOW IF YOU CAN UP THE MG'S, BECAUSE THE WHAT SHE IS TAKING IS NOT STRONG ENOUGH. AND SINCE SHE FELL AGAIN A COUPLE OF MONTHS AGO SHE HAS BEEN HURTING WORSE IN HER BACK, AND SHE ALSO NEEDS HER VISTRIL.  MS Hadlock SAYS THAT IFYOU CANT UP THE MILLIGRAMS SHE SAYS [facility] WILL BE OK W/HER 5/325 PERCOCET. Initial call taken by: Leodis Rains,  September 23, 2008 4:24 PM  Follow-up for Phone Call        Pt's voicemail not set up yet. [phone]. Follow-up by: Vesta Mixer CMA,  September 24, 2008 11:01 AM  Additional Follow-up for Phone Call Additional follow up Details #1::        Pt requesting for refill of percocet and visteril last got percocet #60 and Visteril #15 on 08/25/08 Additional Follow-up by: Vesta Mixer CMA,  September 25, 2008 11:44 AM    Additional Follow-up for Phone Call Additional follow up Details #2::    MS Tegtmeyer CALLED AND SAYS THAT SHE ONLY HAS A RIDE A CERTAIN TIME THAT CAN BRING HER TO PICK UP THE SCRTIPTS Follow-up by: Leodis Rains,  September 25, 2008 5:09 PM  Additional Follow-up for Phone Call Additional follow up Details #3:: Details for Additional Follow-up Action Taken: MS Waltner CALLED AGAIN TO SEE IF HER MEDS WILL BE READY, BECAUSE SHE HAS SOMONE TO BRING HER TO [place] TODAY TO PICK THEM UP.Marland KitchenMarland KitchenKimberly Tinnin  September 26, 2008 9:48 AM  SPOKE WITH PT AND SHE WOULD LIKE SOMETHING STRONGER IF SHE CAN.....[phone]....Marland KitchenArmenia SHANNON  Pt called back again requesting for her pain medication and she is very desperate to get the medication.  Please call her back. If the provider to get her medication by today, she will go to the ER so she can get her a shot for pain...Marland KitchenMarland KitchenManon Hilding  September 26, 2008 2:31 PM MS Martinovich IS CALLING AGAIN  TO SEE IF HER SCRIPT IS READY SHE SAYS THAT SHE MAY HAVE TO BREAK HER PAIN CONTRACT AND GO TO THE HOSPITAL BECAUSE SHE CAN'T SIT ALL WEEKEND IN PAIN...Marland KitchenCala Bradford Tinnin  September 27, 2008 12:12 PM May pick up today.  Julieanne Manson MD  September 27, 2008 1:52 PM  GRACIELA TALKED W/MS Cuffe TO COME AND PICK UP SCRIPT TODAY.  Additional Follow-up by: Leodis Rains,  September 27, 2008 2:25 PM  Prescriptions: VISTARIL 25 MG CAPS (HYDROXYZINE PAMOATE) 1 cap by mouth daily as needed anxiety  #15 x 0   Entered and Authorized by:   Julieanne Manson MD   Signed by:   Julieanne Manson MD on 09/27/2008   Method used:   Print then Give to Patient   RxID:   [id number] PERCOCET 5-325 MG TABS (OXYCODONE-ACETAMINOPHEN) 1 tab by mouth two times a day  #60 x 0   Entered and Authorized by:   Julieanne Manson MD   Signed by:   Julieanne Manson MD on 09/27/2008   Method used:   Print then Give to Patient   RxID:   [id number]

## 2010-02-10 NOTE — Progress Notes (Signed)
Summary: REQUESTING NICORETTE GUM  Phone Note Call from Patient Call back at Home Phone [phone]   Summary of Call: [name] MS Jambor CALLED OT SEE IF YOU WILL BE ABLE TO CALL IN NICORTTE GUM, BECAUSE SHE IS TRYING TO QUIT SMOKING. DR MULBERRY PRESCRIBED THE PATCHES, BUT SHE WANTS THE GUM AS WELL, BECAUSE SHE STILL HAS THE URGE TO SMOKE WHEN SHE GETS UP AND DRINKS HER COFFEE.  MS Pilling SAYS THAT MEDICAID DOES COVER THE GUM.  SHE USES [facility] [address]. Initial call taken by: Leodis Rains,  November 05, 2009 10:45 AM  Follow-up for Phone Call        Sent to [facility] [place].  Dutch Quint RN  November 05, 2009 11:09 AM   Additional Follow-up for Phone Call Additional follow up Details #1::        Rx for patch written 04/2009 - where did she get his information that medicaid covers nicotine gum? (you might call a pharmacy to get fact on this before returning pt's call) I don't think that is the case - no gum or patches will they cover.  Both are actually over the counter I wouldn't prescribe both at the same time anyway Additional Follow-up by: Lehman Prom FNP,  November 05, 2009 7:11 PM    Additional Follow-up for Phone Call Additional follow up Details #2::    [facility] -- pharmacy ran a test pt. and stated that Medicaid does cover patches and gum.    Explained to pt. that provider would not prescribe both at the same time and she queried why.  Explained that it would be a double dose of nicotine, instead of lessening her dosage.  Verbalized understanding.  Dutch Quint RN  November 10, 2009 4:37 PM   Additional Follow-up for Phone Call Additional follow up Details #3:: Details for Additional Follow-up Action Taken: good to know that medicaid covers the patches and gum - useful information She should not be smoking even while wearing the patch. Additional Follow-up by: Lehman Prom FNP,  November 10, 2009 5:24 PM

## 2010-02-10 NOTE — Progress Notes (Signed)
Summary: BAD SINUS INFECTION   Phone Note Call from Patient Call back at [phone]   Summary of Call: Michelle [name]. BAD COUGH, AND CONGESTED, STUFFY NOSE, AND HAS A LOT OG GREEN MUCUS. SHE HAS COUGHED SO MUCH HER HEAD HURTS AND HER CHEST AND BACK. SHE USES [facility] [address] Initial call taken by: Leodis Rains,  September 19, 2007 2:25 PM  Follow-up for Phone Call        Left message on answering machine for [name] to return call at [phone] Follow-up by: Vesta Mixer CMA,  September 20, 2007 12:24 PM  Additional Follow-up for Phone Call Additional follow up Details #1::        [name] says she still has a bad cough with green mucous and that she has taken ibuprofen, cough drops and robitussin and comtrex, she is real sore in her back/chest/stomach from coghing.  X 1/12 week.  NKDA Samantha [employer] Additional Follow-up by: Vesta Mixer CMA,  September 22, 2007 11:52 AM    Additional Follow-up for Phone Call Additional follow up Details #2::    Called and left message--to urgent care if worsens or no improvement over weekend. Push fluids humidifier when sleeping Nyquil at bedtime.  Please call Monday and see if she is still not doing well--schedule with me sometime next week if no better. Follow-up by: Julieanne Manson MD,  September 22, 2007 8:04 PM  Additional Follow-up for Phone Call Additional follow up Details #3:: Details for Additional Follow-up Action Taken: left message for [name] to return our call  left message for [name] to return our call.... Vesta Mixer CMA  September 27, 2007 12:49 PM [place] shannon  send letter Julieanne Manson MD  September 28, 2007 6:30 PM  letter sent to [name] to call if she needs [place] still............... Vesta Mixer CMA  September 29, 2007 10:05 AM    Additional Follow-up by: Vesta Mixer CMA,  September 25, 2007 9:43 AM [place] shannon

## 2010-02-10 NOTE — Progress Notes (Signed)
Summary: SEVERE COUGHT & NEED MEDS   Phone Note Call from Patient Call back at [phone]   Caller: Patient Complaint: Cough/Sore throat Summary of Call: PT WANTS DR MULBERRY TO CALL OR [place] BECAUSE PT IS STILL HAVING COUGHT AND WOULD LIKE A PRESCRIPTION PREDASOL  (BRONCHITIS) [facility] [address] .  PLEASE ,CALL PT [phone] THANK YOU . Initial call taken by: Cheryll Dessert,  December 26, 2007 8:58 AM  Follow-up for Phone Call        Spoke with pt she has had cough x 2 weeks, no fever, it is sometimes productive with green phlegm.  She is not taking anything for it.  Allergy to pcn.  [facility] [address]  She knows it is her bronchitis. Follow-up by: Vesta Mixer CMA,  December 27, 2007 10:38 AM  Additional Follow-up for Phone Call Additional follow up Details #1::        OV please--work her in --did we get a new number as her previous one was disconnected when she called about uti symptoms and she could not be reached Additional Follow-up by: Julieanne Manson MD,  December 27, 2007 11:11 AM    Additional Follow-up for Phone Call Additional follow up Details #2::    Left message on answering machine for pt to return call at [phone]. Follow-up by: Vesta Mixer CMA,  December 27, 2007 12:36 PM  Additional Follow-up for Phone Call Additional follow up Details #3:: Details for Additional Follow-up Action Taken: left message for pt to return our call   called pt and asked the person who answered the phone to let her know that [facility] is trying to reach her [place] Shannon  January 03, 2008 12:11 PM  Left message on answering machine for pt to return call and mailed letter to pt since unable to reach her by phone. Additional Follow-up by: [place] Shannon,  January 02, 2008 9:00 AM

## 2010-02-10 NOTE — Progress Notes (Signed)
  Phone Note Call from Patient Call back at Home Phone [phone]   Caller: Patient Call For: Julieanne Manson MD Reason for Call: Talk to Doctor Details for Reason: Wants work excuse written covering Week of 3/28. Initial call taken by: Janace Aris,  April 08, 2009 2:24 PM  Follow-up for Phone Call        No medical reason for a work excuse.  The back pain is a chronic issue and the edema is not a reason.  As we discussed, if she is on her feet, she needs to walk/move. Follow-up by: Julieanne Manson MD,  April 10, 2009 2:39 PM  Additional Follow-up for Phone Call Additional follow up Details #1::        Pt states she need the note for school, not for work. Additional Follow-up by: Vesta Mixer CMA,  April 14, 2009 9:28 AM    Additional Follow-up for Phone Call Additional follow up Details #2::    The pt states that her last appointment will be next week because she is very tired of this because  it seems that provider is taking too long for renew medication etc  Again, the pt states that she needs the note only for school purpose.  Manon Hilding  April 16, 2009 12:00 PM  Note written--please have pick up  Julieanne Manson MD  April 17, 2009 8:39 AM   Additional Follow-up for Phone Call Additional follow up Details #3:: Details for Additional Follow-up Action Taken: pt aware will come pick up note.............Marland Kitchen Chauncy Passy SMA April 17, 2009 4:01 PM

## 2010-02-10 NOTE — Miscellaneous (Signed)
Summary: 06/26/09 diabetic eye exam  Clinical Lists Changes  Observations: Added new observation of DBT EY CK DT: [facility]:  No diabetic changes (06/26/2009 9:14)

## 2010-02-10 NOTE — Letter (Signed)
Summary: [facility]  [facility]   Imported By: Arta Bruce 07/31/2008 12:14:08  _____________________________________________________________________  External Attachment:    Type:   Image     Comment:   External Document

## 2010-02-10 NOTE — Letter (Signed)
Summary: REFERRAL/DERMATOLOGY/APPT DATE & TIME  REFERRAL/DERMATOLOGY/APPT DATE & TIME   Imported By: Arta Bruce 05/08/2008 12:14:58  _____________________________________________________________________  External Attachment:    Type:   Image     Comment:   External Document

## 2010-02-10 NOTE — Letter (Signed)
Summary: *Referral Letter  [facility]  [address] [place], [place] [address]   Phone: [phone]  Fax: [phone]    05/08/2008  Dear Dr. Shon Hough:  Thank you in advance for agreeing to see my patient:  Michelle [name] [address] [place], [place]  [address]  Phone: [phone]  Reason for Referral: Pt. with chronic low back pain and more recently, neck pain for which she has been followed and undergone surgery(for the lumbar disease) with Dr. Shon Baton and subsequently, Dr. Ethelene Hal.  Dr. Shon Baton feels her breast size may be exacerbating her problem and recommended she see you for evaluation of reduction mammoplasty.  I am not certain that this is a strong element of her pain and would appreciate your input.  Procedures Requested: as above  Current Medical Problems: 1)  OTITIS EXTERNA (ICD-380.10) 2)  NECK PAIN (ICD-723.1) 3)  VAGINITIS, CANDIDAL (ICD-112.1) 4)  HEALTH MAINTENANCE EXAM (ICD-V70.0) 5)  ROUTINE GYNECOLOGICAL EXAMINATION (ICD-V72.31) 6)  DISC DISEASE, LUMBOSACRAL SPINE (ICD-722.52) 7)  ACUTE BRONCHITIS (ICD-466.0) 8)  DEGENERATIVE JOINT DISEASE, LEFT KNEE (ICD-715.96) 9)  INTERTRIGO (ICD-695.89) 10)  DEPRESSION (ICD-311) 11)  ANXIETY (ICD-300.00) 12)  CANDIDIASIS, SKIN (ICD-112.3)   Current Medications: 1)  NEURONTIN 300 MG CAPS (GABAPENTIN) 2 by mouth two times a day 2)  CYMBALTA 60 MG CPEP (DULOXETINE HCL) 1 by mouth once daily  Dr. Allyne Gee 3)  FAMOTIDINE 40 MG TABS (FAMOTIDINE) 1 tab by mouth daily as needed heartburn 4)  CORTISPORIN-TC 3.03-13-08-0.5 MG/ML SUSP (NEOMYCIN-COLIST-HC-THONZONIUM) 3-4 drops right ear qid for 5 days.   Past Medical History: 1)  DISC DISEASE, LUMBOSACRAL SPINE (ICD-722.52) 2)  ACUTE BRONCHITIS (ICD-466.0) 3)  DEGENERATIVE JOINT DISEASE, LEFT KNEE (ICD-715.96) 4)  INTERTRIGO (ICD-695.89) 5)  DEPRESSION (ICD-311) 6)  ANXIETY (ICD-300.00) 7)  CANDIDIASIS, SKIN (ICD-112.3)     Thank you again for  agreeing to see our patient; please contact us if you have any further questions or need additional information.  Sincerely,  Julieanne Manson MD

## 2010-02-10 NOTE — Letter (Signed)
Summary: [facility]  [facility]   Imported By: Arta Bruce 07/31/2008 12:15:33  _____________________________________________________________________  External Attachment:    Type:   Image     Comment:   External Document

## 2010-02-10 NOTE — Assessment & Plan Note (Signed)
Summary: F/U YEAST INFECTION PER [facility]  DR BROOS / NS    Vital Signs:  Patient Profile:   45 Years Old Female Weight:      291 pounds Temp:     97.3 degrees F Pulse rate:   88 / minute Pulse rhythm:   regular BP sitting:   110 / 80  (left arm) Cuff size:   large  Pt. in pain?   yes    Location:   lower back    Intensity:   4  Vitals Entered By: Vesta Mixer CMA (January 27, 2007 12:39 PM)              Is Patient Diabetic? No  Does patient need assistance? Ambulation Normal     Chief Complaint:  still being bothered by rash under breast area and groin area and nerves are reall bad.  History of Present Illness: 1.  Rash in intertriginous areas --breasts and inguinal.  Had Lumbosacral spine surgery recently and surgeon concerned healing surgical wound will be affected (per pt.)  Had an anterior approach in low pelvic area apparently, which I have never seen.  The surgeon has tried different treatments for the rash, including oral antibiotics--Keflex.  Pt. states Lotrisone she was given last Spring did not help.  2.  Anxiety:  Pt. about "ready to lose it"  Not taking her Celexa any longer--cannot say why or when she stopped.  No benzodiazepines as well.  Current Allergies: ! CODEINE    Risk Factors:  Tobacco use:  current    Cigarettes:  Yes -- 1/2 pack(s) per day    Physical Exam  Skin:     Well healed surgical scar in mid lower abdomen, well above involved inguinal folds.  Inguinal folds and skin folds below  and in between breasts with erythematous, lichenified, shiny and in some areas wet appearing rash.    Impression & Recommendations:  Problem # 1:  CANDIDIASIS, SKIN (ICD-112.3) Fluconazole 100 mg 2 tabs by mouth today, then 100 mg daily for 14 day total course.\par To follow up with me in 2 weeks. Keep areas as dry as possible.  Problem # 2:  ANXIETY (ICD-300.00) And depression. Restart meds as below. Diazepam only to be used  temporarily Her updated medication list for this problem includes:    Citalopram Hydrobromide 20 Mg Tabs (Citalopram hydrobromide) .Marland Kitchen... 1 tab by mouth daily    Diazepam 2 Mg Tabs (Diazepam) .Marland Kitchen... 1-2 by mouth by mouth once daily as needed anxiety   Complete Medication List: 1)  Diflucan 100 Mg Tabs (Fluconazole) .... 2 tabs by mouth today, then 1 tab by mouth daily for 14 days. 2)  Citalopram Hydrobromide 20 Mg Tabs (Citalopram hydrobromide) .Marland Kitchen.. 1 tab by mouth daily 3)  Diazepam 2 Mg Tabs (Diazepam) .Marland Kitchen.. 1-2 by mouth by mouth once daily as needed anxiety   Patient Instructions: 1)  Appt. 2 weeks with Dr.  Delrae Alfred    Prescriptions: DIAZEPAM 2 MG  TABS (DIAZEPAM) 1-2 by mouth by mouth once daily as needed anxiety  #20 x 0   Entered and Authorized by:   Julieanne Manson MD   Signed by:   Julieanne Manson MD on 01/27/2007   Method used:   Print then Give to Patient   RxID:   [id number] CITALOPRAM HYDROBROMIDE 20 MG  TABS (CITALOPRAM HYDROBROMIDE) 1 tab by mouth daily  #30 x 2   Entered and Authorized by:   Julieanne Manson MD   Signed by:  Julieanne Manson MD on 01/27/2007   Method used:   Print then Give to Patient   RxID:   [id number] DIFLUCAN 100 MG  TABS (FLUCONAZOLE) 2 tabs by mouth today, then 1 tab by mouth daily for 14 days.  #15 x 0   Entered and Authorized by:   Julieanne Manson MD   Signed by:   Julieanne Manson MD on 01/27/2007   Method used:   Print then Give to Patient   RxID:   [id number]  ]

## 2010-02-10 NOTE — Progress Notes (Signed)
Summary: Lab Results   Phone Note Call from Patient Call back at [facility] Phone [phone]   Summary of Call: The pt wants to get her lab results.  The pt was desperate to get her lab results and at the end of the message she said a bad word that start with F....? [place] Md.  Initial call taken by: Manon Hilding,  May 02, 2008 4:50 PM  Follow-up for Phone Call        Fwd to Dr. Delrae Alfred for results. Follow-up by: Vesta Mixer CMA,  May 02, 2008 4:54 PM  Additional Follow-up for Phone Call Additional follow up Details #1::        Please call and let pt. know her labs were all fine--all STD checks were negative and she will receive a letter in next couple of weeks. Additional Follow-up by: Julieanne Manson MD,  May 05, 2008 10:27 AM    Additional Follow-up for Phone Call Additional follow up Details #2::    Attempted to call pt she answered, but apparently could not hear me.  She will receive letter in mail. Follow-up by: Vesta Mixer CMA,  May 07, 2008 4:29 PM

## 2010-02-10 NOTE — Letter (Signed)
Summary: RECORDS RECEIVED FROM [facility] RECEIVED FROM [facility]   Imported By: Arta Bruce 12/12/2009 12:47:50  _____________________________________________________________________  External Attachment:    Type:   Image     Comment:   External Document

## 2010-02-10 NOTE — Letter (Signed)
Summary: [facility]  [facility]   Imported By: Arta Bruce 05/13/2008 12:21:46  _____________________________________________________________________  External Attachment:    Type:   Image     Comment:   External Document

## 2010-02-10 NOTE — Letter (Signed)
Summary: Lipid Letter  [facility]  [address] [place], [place] [address]   Phone: [phone]  Fax: [phone]    05/05/2008  Michelle [name] [address] [place], [place]  [address]  Dear Michelle [name]:  We have carefully reviewed your last lipid profile from 04/26/2008 and the results are noted below with a summary of recommendations for lipid management.    Cholesterol:       127     Goal: <200   HDL "good" Cholesterol:   46     Goal: >45   LDL "bad" Cholesterol:   66     Goal: <100   Triglycerides:       73     Goal: <150    Your labs were fine.  Cholesterol panel is good--should recheck in another 5 years.  All of STD tests were negative.    TLC Diet (Therapeutic Lifestyle Change): Saturated Fats & Transfatty acids should be kept < 7% of total calories ***Reduce Saturated Fats Polyunstaurated Fat can be up to 10% of total calories Monounsaturated Fat Fat can be up to 20% of total calories Total Fat should be no greater than 25-35% of total calories Carbohydrates should be 50-60% of total calories Protein should be approximately 15% of total calories Fiber should be at least 20-30 grams a day ***Increased fiber may help lower LDL Total Cholesterol should be < 200mg /day Consider adding plant stanol/sterols to diet (example: Benacol spread) ***A higher intake of unsaturated fat may reduce Triglycerides and Increase HDL    Adjunctive Measures (may lower LIPIDS and reduce risk of Heart Attack) include: Aerobic Exercise (20-30 minutes 3-4 times a week) Limit Alcohol Consumption Weight Reduction Aspirin 75-81 mg a day by mouth (if not allergic or contraindicated) Dietary Fiber 20-30 grams a day by mouth     Current Medications: 1)    Neurontin 300 Mg Caps (Gabapentin) .... 2 by mouth two times a day 2)    Cymbalta 60 Mg Cpep (Duloxetine hcl) .Marland Kitchen.. 1 by mouth once daily  dr. Allyne Gee 3)    Famotidine 40 Mg Tabs (Famotidine) .Marland Kitchen.. 1 tab by mouth daily as  needed heartburn 4)    Cortisporin-tc 3.03-13-08-0.5 Mg/ml Susp (Neomycin-colist-hc-thonzonium) .... 3-4 drops right ear qid for 5 days.  If you have any questions, please call. We appreciate being able to work with you.   Sincerely,    [facility] Michelle Manson MD

## 2010-02-10 NOTE — Progress Notes (Signed)
   Phone Note Other Incoming   Summary of Call: pt came in today and was late for her appt with Dr Delrae Alfred by . Front office staff explained to patient that she was late and they would need to speak with the clinical staff prior to checking her in for her appt. Graciela called back to clinical staff and clinical staff discussed situation with Rainey Rodger. Jaspreet Bodner stated that if someone doesnt show for their appt she can be seen or she can wait to be seen at the end of her morning session. Regardless pt would have been seen in either case. Clinical staff went to front office and spoke with patient regarding the decision of the Dhyan Noah and to assure patient that we will do everything possible to see her but the Jacky Dross is running behind and we will get you as soon as we  can. Pt was not satisfied with the answer and began to curse at staff member regarding the situation and also comment to "F*** [facility]. Pt left the building very upset and never accepted either offer. Pt came back into building about 5-10 minutes later upset wanting to talk with the supervisor at that point. I went up front to reiterate to patient what our policy was regarding late patients. Also to assure her that we want to help and will still see her for an appt today but she will be the last patient of the morning session at this point. Pt was upset and stated that she wasnt but late and she had to drive from [place] to get here and its not her fault there are stop lights. I also advised pt that we do not service patients that live in [place] as well so pt was even more upset. I once again offered pt to be seen as the last pt of the morning. She refused and stated that she did not want to see Dr Delrae Alfred anymore and that she wasnt coming back. Pt walked out the building ending the converstation. Initial call taken by: Mikey College CMA,  Jun 06, 2008 12:01 PM

## 2010-02-10 NOTE — Letter (Signed)
Summary: [facility]  [facility]   Imported By: Arta Bruce 07/31/2008 12:19:12  _____________________________________________________________________  External Attachment:    Type:   Image     Comment:   External Document

## 2010-02-10 NOTE — Letter (Signed)
Summary: AGREEMENT FOR CONTROLLED SUBSTANCE  AGREEMENT FOR CONTROLLED SUBSTANCE   Imported By: Arta Bruce 07/31/2008 10:02:23  _____________________________________________________________________  External Attachment:    Type:   Image     Comment:   External Document

## 2010-02-10 NOTE — Letter (Signed)
Summary: [facility]  [facility]   Imported By: Arta Bruce 05/13/2008 12:20:03  _____________________________________________________________________  External Attachment:    Type:   Image     Comment:   External Document

## 2010-02-10 NOTE — Progress Notes (Signed)
Summary: requesting pain meds since Friday   Phone Note Call from Patient Call back at [phone]    Summary of Call: [name] WENT TO [place] WEDNESDAY NIGHT FOR MERCR ON HER ELBOW, THEY GAVE HER AN ANTIBIOTIC AND 20 PERCOCET AND SHE WANTS TO KNOW IF SHE CAN GET A REFILL ON THE PERCOCET, SHE HAS BEEN TAKING 1-2 EVERY 4-6 HRS.  SHE SAYS THIS TIME IT WAS BAD AND LATER THAT NIGHT SHE HAD TAKEN A HOT SHOWER AND IT CAME TO A HEAD AND STARTED DRAINING AND HER FRIEND HELPED HER GET THE CORE OUT AND NOW SHE HAS HOLE IN HER ELBOW AND IN A LOT OF PAIN. SHE ALSO WANTS TO NOW IF HER APPT WITH DR. Aurelio Jew HAS BEEN MADE FOR HER BREAST REDUCTION. SHE USE [facility] [address] Initial call taken by: Leodis Rains,  May 17, 2008 3:20 PM  Follow-up for Phone Call        pt calling back checking to see if there was anyway that she could get a refill on the pain meds. Pt states that she has been released from Dr Shon Baton and can no longer get refills from him. Pt states that she is in a lot of pain. Follow-up by: Mikey College CMA,  May 21, 2008 4:14 PM  Additional Follow-up for Phone Call Additional follow up Details #1::        Pt. needs to be seen for pain meds. See if she can be worked in in next 2 days. Additional Follow-up by: Julieanne Manson MD,  May 21, 2008 5:32 PM    Additional Follow-up for Phone Call Additional follow up Details #2::    Schedules already full and several double books.  Will try to see if can work in next week.  See if pt can come Thursday May 27th at 10:30. Follow-up by: Vesta Mixer CMA,  May 23, 2008 10:39 AM  Additional Follow-up for Phone Call Additional follow up Details #3:: Details for Additional Follow-up Action Taken: PT HAVE AN APPT 5 -27-10 @ 10:30 AM WITH DR MULBERRY  Additional Follow-up by: Cheryll Dessert,  May 27, 2008 11:58 AM

## 2010-02-10 NOTE — Letter (Signed)
Summary: [facility]  [facility]   Imported By: Arta Bruce 05/13/2008 12:16:40  _____________________________________________________________________  External Attachment:    Type:   Image     Comment:   External Document

## 2010-02-10 NOTE — Letter (Signed)
Summary: [facility]  [facility]   Imported By: Arta Bruce 07/31/2008 12:40:16  _____________________________________________________________________  External Attachment:    Type:   Image     Comment:   External Document

## 2010-02-10 NOTE — Progress Notes (Signed)
Summary: ASKING [facility]O PAIN REFERRAL   Phone Note Call from Patient Call back at [phone]   Summary of Call: IN ALOT OG PAIN NEED TO TALK TO YOPU ABOUT HER MEDS.[phone]// Initial call taken by: Arta Bruce,  July 15, 2009 10:40 AM  Follow-up for Phone Call        Pt is under a lot of pain ( back area), and she needs refills from her pecocet medication; the pt mentioned that she is returning back to here because [facility] do not fill her needs.  Pt needs this asap so she doesn't need to return back to the Hospital. .Manon Hilding  July 15, 2009 4:11 PM  Additional Follow-up for Phone Call Additional follow up Details #1::        MS Camberos CALLED AND WANT TO KNOW IF WE CAN DO A REFERRAL TO  [facility] [address] SHE WAS GOING TO [facility], BUT THEY RELEASED HER. SHE SAYS THAT AT [facility] SHE HAS TRIED AND TRIED TO GET A REFERRAL BUT THEY WANT DO IT, & SHE'S BEEN HURTING SO BAD AND SHE NEEDS TO BACK ON HER MEDICATIONS. YOU CAN REACH HER ON HER CELL, AT [phone]. Additional Follow-up by: Leodis Rains,  July 16, 2009 10:40 AM    Additional Follow-up for Phone Call Additional follow up Details #2::    Pt not happy with service at [facility] and is transferring back here.  She is scheduled to be seen on 07/31/09.   Follow-up by: Vesta Mixer CMA,  July 16, 2009 4:36 PM  Additional Follow-up for Phone Call Additional follow up Details #3:: Details for Additional Follow-up Action Taken: Needs to come in and sign a release of information for both [facility] and [facility]--need to see those records before will CONSIDER restart of pain meds.  Julieanne Manson MD  July 17, 2009 3:25 PM  Did she do this yet?  Julieanne Manson MD  August 03, 2009 5:19 PM    Pt aware she needs to fill out release and will come to do so.......  Tiffany McCoy CMA  August 06, 2009 11:01 AM

## 2010-02-10 NOTE — Letter (Signed)
[facility]   Imported By: Arta Bruce 09/30/2009 10:16:08  _____________________________________________________________________  External Attachment:    Type:   Image     Comment:   External Document

## 2010-02-10 NOTE — Letter (Signed)
Summary: REFERRAL/PAIN MANAGEMENT  REFERRAL/PAIN MANAGEMENT   Imported By: Arta Bruce 11/05/2008 12:40:21  _____________________________________________________________________  External Attachment:    Type:   Image     Comment:   External Document

## 2010-02-10 NOTE — Letter (Signed)
Summary: REQUESTED RECORDS FROM [facility]  REQUESTED RECORDS FROM  [facility]   Imported By: Arta Bruce 05/06/2008 11:55:48  _____________________________________________________________________  External Attachment:    Type:   Image     Comment:   External Document

## 2010-02-10 NOTE — Progress Notes (Signed)
Summary: referral   Phone Note Call from Patient Call back at Home Phone [phone] Call back at [phone]   Caller: Patient Summary of Call: The pt needs the provider referral her to Dr Perry Mount [phone] so she can get some breast reduction. Dr Delrae Alfred  Initial call taken by: Manon Hilding,  March 25, 2008 3:39 PM  Follow-up for Phone Call        tried calling pt but no answer .........Marland KitchenArmenia Shannon  March 25, 2008 4:34 PM    Additional Follow-up for Phone Call Additional follow up Details #1::        pt says it is medical necessarity for her to have breast reduction .Marland Kitchen... Dr. Sondra Come dale encourage her to get one since pt is having so much pain in her back.. pt would like to have pain relievers for her back she is taking ibuprofen and tyenlol but they do not help pt's pharmacy is  [facility] on [address]..... Additional Follow-up by: Arm[place] Shannon,  March 28, 2008 2:56 PM    Additional Follow-up for Phone Call Additional follow up Details #2::    Have we received records from Dr. Shon Baton and [facility] on this pt? Follow-up by: Julieanne Manson MD,  March 31, 2008 5:35 PM  Additional Follow-up for Phone Call Additional follow up Details #3:: Details for Additional Follow-up Action Taken: FOUND CHART,GAVE TO [facility]  Records still not in from [place] or [place].  Will call pt to see if she is getting them to [place]......Marland KitchenMarland KitchenArta Bruce,  April 04, 2008 12:54 PM  Fast busy signal............ Tiffany McCoy CMA  April 08, 2008 11:10 AM   Pt. does not need my referral to go to the surgeon.  She will have to pay out of pocket no matter whether I send a formal referral or not.  I have not seen her in over 1 year and cannot make a referral without more knowledge regarding her problem.  However, as I said, she will have to pay out of pocket for an elective procedure regardless.  Julieanne Manson MD  April 09, 2008 6:01 PM    spoke with pt and she doesn't  understand why its taking so long for her to get an appointment to have her surgery.... i explained to her that we are waiting on the information from Dr. Shon Baton she said we don't need the records from Dr. Claretha Cooper because she will recieve her pap from [place] she would just like the surgery for her breast to get reduced..... and she would like to know if she can recieve any meds for the back pain she is having.Marland KitchenMarland KitchenArmenia Shannon  April 09, 2008 4:25 PM  Left message on answering machine for pt to call back.Marland KitchenMarland KitchenArmenia Shannon  April 15, 2008 11:37 AM  Pt called back she had medicaid now and gave [place] the number for Dr. Shon Baton.  Left message on Joy's voicemail, medical records for Dr.Brooks office. She also has f/u appt here on 4/16...............Marland KitchenTiffany McCoy CMA. April 15, 2008 12:04 PM   Await those records.  Julieanne Manson MD  April 16, 2008 2:02 PM

## 2010-02-10 NOTE — Letter (Signed)
Summary: [facility]  [facility]   Imported By: Arta Bruce 07/31/2008 12:50:08  _____________________________________________________________________  External Attachment:    Type:   Image     Comment:   External Document

## 2010-02-10 NOTE — Op Note (Signed)
Summary: Operative Report  Operative Report   Imported By: Arta Bruce 07/31/2008 12:22:05  _____________________________________________________________________  External Attachment:    Type:   Image     Comment:   External Document

## 2010-02-10 NOTE — Progress Notes (Signed)
Summary: RASH/NNEDS CREAM  Medications Added KETOCONAZOLE 2 % CREA (KETOCONAZOLE) Apply two times a day to affected area for 2 weeks       Phone Note Call from Patient Call back at Home Phone [phone]   Summary of Call: A friend of her has the same kind of rash that she has and Ms. Dacy wants to know if she can get the same kind of medication her friend has which is Keta  002% -sulta 05%.  [facility] [place]). [place] Initial call taken by: Manon Hilding,  December 23, 2008 2:56 PM  Follow-up for Phone Call        MS Treat IS CALLING AND SYING THAT SHE HAS A RASH BETWEEN HER BREAST AND A FRIEND HAD A CREAM CALLED KETO 2% AND SHE USED IT AND IT HELPED HER ON THAT RASH. AND SHE WANTS TO KNOW IF YOU CAN CALL THIS INTO [facility] ON [address]. THERE IS NO [facility] [address].  SHE SAYS IT IS VERY IRRITATED AND THIS IS THE ONLY THING THAT HELPS. Follow-up by: Leodis Rains,  January 01, 2009 11:12 AM  Additional Follow-up for Phone Call Additional follow up Details #1::        left message to return call...............Marland KitchenMikey College CMA  January 02, 2009 2:23 PM     Additional Follow-up for Phone Call Additional follow up Details #2::    Pt states the rash between her breast is still there and very itchy and a friend of hers gave her some Ketoconizole cream 2% and it worked great and the rash went away, but she ran out and the rash came back. [facility].  Pt can be reached [phone] Follow-up by: Vesta Mixer CMA,  January 08, 2009 10:09 AM  Additional Follow-up for Phone Call Additional follow up Details #3:: Details for Additional Follow-up Action Taken: MS Perezperez CALLED AGAIN TODAY NEEDS HER RASH CREAM/PLEASE TALK TO DR. MULBERRY SO SHE CAN GET HER CREAM TODAY.Marland KitchenB[facility]  January 09, 2009 9:57 AM  Pt aware.   Additional Follow-up by: Vesta Mixer CMA,  January 09, 2009 5:27 PM  New/Updated Medications: KETOCONAZOLE 2 % CREA  (KETOCONAZOLE) Apply two times a day to affected area for 2 weeks Prescriptions: KETOCONAZOLE 2 % CREA (KETOCONAZOLE) Apply two times a day to affected area for 2 weeks  #30gram x 1   Entered by:   Vesta Mixer CMA   Authorized by:   Julieanne Manson MD   Signed by:   Vesta Mixer CMA on 01/09/2009   Method used:   Electronically to        [facility] (retail)       [address]       [place], [place]  [id number]       Ph: [phone]       Fax: [phone]   RxID:   [id number] KETOCONAZOLE 2 % CREA (KETOCONAZOLE) Apply two times a day to affected area for 2 weeks  #30gram x 1   Entered and Authorized by:   Vesta Mixer CMA   Signed by:   Vesta Mixer CMA on 01/09/2009   Method used:   Print then Give to Patient   RxID:   [id number]

## 2010-02-10 NOTE — Letter (Signed)
Summary: [facility]  [facility]   Imported By: Arta Bruce 07/31/2008 12:30:04  _____________________________________________________________________  External Attachment:    Type:   Image     Comment:   External Document

## 2010-02-10 NOTE — Progress Notes (Signed)
Summary: NEEDS REFILL ON HER VISTIRIL   Phone Note Call from Patient Call back at [phone]   Reason for Call: Refill Medication Summary of Call: [name] Michelle [name] SAYS THAT SHE DID NOT GET HER VISTIRIL REFILLED. SHE SAYS THAT SHE ALWAYS GETS IT WHEN SHE GETS HER PERCOCET.  SHE ALSO DROPPED OFF THE NEW PATIENT INTAKE REFERRAL FORM THAT NEEDS TO BE FILLED OUT. Initial call taken by: Leodis Rains,  October 29, 2008 4:00 PM  Follow-up for Phone Call        Pt did not give urine sample Friday, will do it when she picks up this script if ok'd.  Last got Vistaril #15 on 09/27/08. Follow-up by: Vesta Mixer CMA,  October 30, 2008 11:58 AM  Additional Follow-up for Phone Call Additional follow up Details #1::        Rx at station for pt to pick up Additional Follow-up by: Lehman Prom FNP,  October 31, 2008 7:53 AM    Additional Follow-up for Phone Call Additional follow up Details #2::    pt wants to make sure if the Michelle [name] filled out her form for the pain clinic.Manon Hilding  October 31, 2008 4:20 PM Pt came in to pick up Vistaril, but left without med when told she needed to give a urine sample, she said her ride was waiting on her and she could not keep them waiting while she used the bathroom and that she would be back today or Monday to give a urine. Follow-up by: Vesta Mixer CMA,  November 01, 2008 3:30 PM  Additional Follow-up for Phone Call Additional follow up Details #3:: Details for Additional Follow-up Action Taken: Pt. will need to come in for a visit before will fill Vistaril as she refused urine sample--please shred the prescription.  This OV is not an emergency.  Tiffany--will leave the pain management paperwork on your desk--if you could complete the blanks let at the bottom--above and below "contact name"  Julieanne Manson MD  November 03, 2008 12:14 PM b  Michelle [name] IS SCHEDULED TO COME IN AND SEE YOU ON NOV 11th AND SHE SAYS THAT SHE WILL ALSO DO THE  URINE TEST THAT DAY.Marland KitchenCala Bradford Tinnin  November 04, 2008 4:24 PM  ok noted.  Additional Follow-up by: Vesta Mixer CMA,  November 05, 2008 4:53 PM  Prescriptions: VISTARIL 25 MG CAPS (HYDROXYZINE PAMOATE) 1 cap by mouth daily as needed anxiety  #15 x 0   Entered and Authorized by:   Lehman Prom FNP   Signed by:   Lehman Prom FNP on 10/31/2008   Method used:   Print then Give to Patient   RxID:   [id number]

## 2010-02-10 NOTE — Letter (Signed)
Summary: [facility]  [facility]   Imported By: Arta Bruce 07/31/2008 12:41:18  _____________________________________________________________________  External Attachment:    Type:   Image     Comment:   External Document

## 2010-02-10 NOTE — Letter (Signed)
Summary: [facility]  [facility]   Imported By: Arta Bruce 07/31/2008 12:36:58  _____________________________________________________________________  External Attachment:    Type:   Image     Comment:   External Document

## 2010-02-10 NOTE — Letter (Signed)
Summary: MAILED REQUESTED RECORDS TO [facility]  MAILED REQUESTED RECORDS TO [facility]   Imported By: Arta Bruce 05/09/2009 11:25:36  _____________________________________________________________________  External Attachment:    Type:   Image     Comment:   External Document

## 2010-02-10 NOTE — Letter (Signed)
Summary: [facility]  [facility]   Imported By: Arta Bruce 05/08/2008 12:58:18  _____________________________________________________________________  External Attachment:    Type:   Image     Comment:   External Document

## 2010-02-10 NOTE — Letter (Signed)
Summary: [facility]  [facility]   Imported By: Arta Bruce 07/31/2008 12:33:26  _____________________________________________________________________  External Attachment:    Type:   Image     Comment:   External Document

## 2010-02-10 NOTE — Letter (Signed)
Summary: [facility]  [facility]   Imported By: Arta Bruce 10/02/2009 10:45:50  _____________________________________________________________________  External Attachment:    Type:   Image     Comment:   External Document

## 2010-02-10 NOTE — Letter (Signed)
Summary: [facility]  [facility]   Imported By: Arta Bruce 07/31/2008 12:46:10  _____________________________________________________________________  External Attachment:    Type:   Image     Comment:   External Document

## 2010-02-10 NOTE — Letter (Signed)
Summary: [facility]   Imported By: Leodis Rains 06/18/2008 14:23:36  _____________________________________________________________________  External Attachment:    Type:   Image     Comment:   External Document

## 2010-02-10 NOTE — Letter (Signed)
Summary: [facility]  [facility]   Imported By: Arta Bruce 09/30/2009 10:14:38  _____________________________________________________________________  External Attachment:    Type:   Image     Comment:   External Document

## 2010-02-10 NOTE — Letter (Signed)
Summary: CALL A NURSE  CALL A NURSE   Imported By: Arta Bruce 10/20/2009 14:40:41  _____________________________________________________________________  External Attachment:    Type:   Image     Comment:   External Document

## 2010-02-10 NOTE — Letter (Signed)
Summary: [facility]  [facility]   Imported By: Arta Bruce 12/25/2007 15:02:01  _____________________________________________________________________  External Attachment:    Type:   Image     Comment:   External Document

## 2010-02-10 NOTE — Progress Notes (Signed)
Summary: REF TO ORTHO   Phone Note Call from Patient Call back at Home Phone [phone] Call back at [phone]   Summary of Call: the pt wants the provider referral to [facility] because she have done a knee surgery in the past.  Pt will be ending school around 11 am so if you need to call her back please do it after this time. Michelle Chandran MD Initial call taken by: Michelle [name],  March 25, 2009 9:29 AM  Follow-up for Phone Call        PATIENT CALLED BACK TO SEE IF SHE CAN GET A REFERRAL TO SEE A DR AT [facility], THE SAME PLACE SHE WENT TO THAT DID HER KNEE SURGERY IN 2009. SHE SAW DR BROOKS ONCE BEFORE, BUT SHE OWES HIS OFFICE $500 AND THEY WON'T SEE HER. Follow-up by: Michelle [name],  March 25, 2009 11:41 AM  Additional Follow-up for Phone Call Additional follow up Details #1::        Left message on answering machine for pt to return call at [phone]. Additional Follow-up by: Michelle [name] CMA,  March 26, 2009 10:41 AM    Additional Follow-up for Phone Call Additional follow up Details #2::    Pt stopped by and wants referral to [facility] for her bad back pain that is going down her right leg.  It has been hurting a long time, but is getting worse.  She thinks one of the screws in her leg may have come loose.  Pt also requesting for her inhaler to [employer].  Pt also c/o swelling in her legs left worse than right.  She had me look at her legs there was some pitting edema in both.  I did tell her she would probably need an ov before anything could be prescribe. Follow-up by: Michelle [name] CMA,  March 26, 2009 3:25 PM  Additional Follow-up for Phone Call Additional follow up Details #3:: Details for Additional Follow-up Action Taken: PT CALLED BACK AGAIN BECAUSE HER LEG IS VERY SWOLLEN (PT HAD TO BE OUT OF SCHOOL). PT IS VERY UPSET ABOUT THIS.  PT HAD BEEN WAITING FOR A WEEK. Michelle [name]  March 31, 2009 9:31 AM  Please make an OV with me  in next week sometime--will address the knee and the leg swelling, and breathing  only.  The inhaler was written for an acute illness--it is not something she is prescribed regularly and she will need to be seen if she feels she needs this again as well.  Michelle Manson MD  April 01, 2009 5:23 PM   Pt will come on Tuesday, April 08, 2009 at 10;15 am.Michelle [name]  April 02, 2009 9:28 AM

## 2010-02-10 NOTE — Progress Notes (Signed)
Summary: NEEDS TO BE SEEN//PLEASE CALL   Phone Note Call from Patient   Summary of Call: went to emr last night ,,hospital said she needs fluid pills and circulation pills ///.[phone]//hospital said she needs to be seen today//said she called last week and never heard back Initial call taken by: Arta Bruce,  April 01, 2009 10:02 AM  Follow-up for Phone Call        The pt called in today because she went to ER last night (3am to 9 am) and both of her legs are very swollen and she needs to do hospital follow up visit this week.Manon Hilding  April 01, 2009 3:30 PM  Additional Follow-up for Phone Call Additional follow up Details #1::        Pt's ED eval printed for your review. Additional Follow-up by: Vesta Mixer CMA,  April 01, 2009 4:49 PM    Additional Follow-up for Phone Call Additional follow up Details #2::    Please see previous phone note  Julieanne Manson MD  April 01, 2009 5:28 PM  Pt will come on Tuesday, April 08, 2009 at 10:15 am.Graciela Kellar  April 02, 2009 9:28 AM

## 2010-02-10 NOTE — Letter (Signed)
Summary: [facility]  [facility]   Imported By: Arta Bruce 01/01/2008 11:08:03  _____________________________________________________________________  External Attachment:    Type:   Image     Comment:   External Document

## 2010-02-10 NOTE — Progress Notes (Signed)
   Phone Note Call from Patient   Reason for Call: Refill Medication Summary of Call: spoke with pt to informed her about not able to recieve a referral for a breast reduction and pt stated that she does have medicad and she does have an appt to see you on April 26, 2008......Marland Kitchen pt stated that she is having a lot of back pain (she stated that she pick up a lawn mower to move it)  and she would like meds for the pain and pt stated that she was out of Diazepam med which last filled on 20 x 0 on 03-21-07. Initial call taken by: [place] Shannon,  April 15, 2008 12:10 PM  Follow-up for Phone Call        There are 2 phone notes on this issue and decision must wait on dr.Mulberrry.Marland KitchenMarland KitchenBenetta Spar Rankins MD  April 15, 2008 1:33 PM   Additional Follow-up for Phone Call Additional follow up Details #1::        As stated before, I have not seen the pt. is some time and cannot treat her over the phone.  She will need to be seen first.  Please explain that I cannot make an assessment until I get the records from the physician who recommends a breast reduction and cannot prescribe medication for back pain until she is seen.   Additional Follow-up by: Julieanne Manson MD,  April 16, 2008 6:42 PM    Additional Follow-up for Phone Call Additional follow up Details #2::    Pt aware, we have left msg for medical records at Dr. Shon Baton and pt has appt for CPP scheduled next week. Follow-up by: Vesta Mixer CMA,  April 18, 2008 9:45 AM

## 2010-02-10 NOTE — Letter (Signed)
Summary: [facility]  [facility]   Imported By: Arta Bruce 05/13/2008 12:18:05  _____________________________________________________________________  External Attachment:    Type:   Image     Comment:   External Document

## 2010-02-10 NOTE — Miscellaneous (Signed)
   Clinical Lists Changes  Problems: Added new problem of CARPAL TUNNEL SYNDROME, BILATERAL (ICD-354.0)

## 2010-02-10 NOTE — Progress Notes (Signed)
   Phone Note Call from Patient Call back at [facility] Phone [phone]   Caller: Patient Summary of Call: The patient said that her last visit the provider has done some test but she still doesn't has any answerr.  Please call her back Dr. Delrae Alfred Initial call taken by: Manon Hilding,  Zarielle Cea 13, 2009 9:43 AM  Follow-up for Phone Call        Still awaiting results from fungal culture.  Should be back any day take around 4 weeks and it was sent to lab on 03/22/07 Follow-up by: Vesta Mixer CMA,  Collis Thede 13, 2009 12:45 PM

## 2010-02-10 NOTE — Letter (Signed)
Summary: [facility]  [facility]   Imported By: Arta Bruce 05/08/2008 13:00:30  _____________________________________________________________________  External Attachment:    Type:   Image     Comment:   External Document

## 2010-02-10 NOTE — Letter (Signed)
[facility]   Imported By: Arta Bruce 06/19/2009 10:03:36  _____________________________________________________________________  External Attachment:    Type:   Image     Comment:   External Document

## 2010-02-10 NOTE — Assessment & Plan Note (Signed)
Summary: surgical documentation   Allergies: 1)  ! Codeine  Past History:  Past Surgical History: 1. 11/28/07:   Left Total knee replacement  Dr. Jerl Santos 2.  12/05/06:  Lumbar surgery--L5-S1 fusion.  Dr. Shon Baton. 3.  3 arthroscopic surgeries on knees--twice on left and one on right in years past 4.  1986 and 1991:  C/S 5.  1991 Laparoscopic Cholecystectomy 6.  1994 and 2000:  Cspine surgery  Dr. Channing Mutters   Complete Medication List: 1)  Neurontin 300 Mg Caps (Gabapentin) .... 2 by mouth two times a day 2)  Cymbalta 60 Mg Cpep (Duloxetine hcl) .Marland Kitchen.. 1 by mouth once daily  dr. Allyne Gee 3)  Famotidine 40 Mg Tabs (Famotidine) .Marland Kitchen.. 1 tab by mouth daily as needed heartburn 4)  Cortisporin-tc 3.03-13-08-0.5 Mg/ml Susp (Neomycin-colist-hc-thonzonium) .... 3-4 drops right ear qid for 5 days.

## 2010-02-10 NOTE — Letter (Signed)
Summary: MAILED REQUESTEDRECORDS TO [facility]  MAILED REQUESTEDRECORDS TO [facility]   Imported By: Arta Bruce 12/19/2009 13:58:59  _____________________________________________________________________  External Attachment:    Type:   Image     Comment:   External Document

## 2010-02-10 NOTE — Letter (Signed)
Summary: [facility]ED REQUESTED RECORDS TO [facility]  [facility]ED REQUESTED RECORDS TO [facility]   Imported By: Arta Bruce 11/05/2008 12:19:15  _____________________________________________________________________  External Attachment:    Type:   Image     Comment:   External Document

## 2010-02-10 NOTE — Progress Notes (Signed)
Summary: NEED HER NICOTENE PATCHES  Medications Added NICOTINE 21 MG/24HR PT24 (NICOTINE) Apply topically to skin daily for smoking       Phone Note Call from Patient Call back at [facility] Phone [phone]   Summary of Call: [place] PT. MS Maberry CALLED SO SHE CAN LET DR Nimra Puccinelli KNOW THAT SHE IS GOING TO GO W/GETTING THE NICOTENE PATCHES. SHE CALLED MEDICAID AND HER PHARMACY AND WAS TOLD THAT IT IS COVERED BY MEDICAID. MS Heideman WANTS TO KNOW IF ONE OF THE PROVIDERS HERE CAN CALL IN THE PATCHES. IF YOU LOOK IN THE VISIT ON FEB. 8th VIIT, DR Ladana Chavero HAS THE DOSE OF THE PATCHES.  MS Duchemin USES [facility] ON [address] Initial call taken by: Leodis Rains,  April 28, 2009 3:34 PM  Follow-up for Phone Call        Fwd to provider for rx auth. Follow-up by: Vesta Mixer CMA,  April 28, 2009 3:44 PM  Additional Follow-up for Phone Call Additional follow up Details #1::        Rx ordered and faxed to [facility] aid dose started at amount indicated by Dr. Delrae Alfred. She will need to take this for 1 month then next month the dose will be tapered for next month so she will need to get a another rx Additional Follow-up by: Lehman Prom FNP,  April 28, 2009 5:44 PM    Additional Follow-up for Phone Call Additional follow up Details #2::    Called pt and adv. her of her Rx being called in.   ...........Marland Kitchen Chauncy Passy SMA    April 29, 2009 11:11 AM   New/Updated Medications: NICOTINE 21 MG/24HR PT24 (NICOTINE) Apply topically to skin daily for smoking Prescriptions: NICOTINE 21 MG/24HR PT24 (NICOTINE) Apply topically to skin daily for smoking  #30 x 0   Entered and Authorized by:   Lehman Prom FNP   Signed by:   Lehman Prom FNP on 04/28/2009   Method used:   Electronically to        [facility] (retail)       [address]       [place], [place]  [id number]       Ph: [phone]       Fax: [phone]   RxID:   [id number]

## 2010-02-10 NOTE — Progress Notes (Signed)
Summary: Muscel Relaxers are due  Phone Note Call from Patient Call back at Home Phone [phone]   Reason for Call: Refill Medication Summary of Call: Michelle [name] pt. Michelle [name] says that her muscle relaxers are due,(robaxin) she says that she has been taking them so long that they do work that well and wants to know if you can call something in stronger, if not then thats fine. She says that whatever you do don't call in flexeril, it makes her too sleppy.  She uses [facility] on [address]. Initial call taken by: Leodis Rains,  November 24, 2009 11:34 AM  Follow-up for Phone Call        She should be getting these from the Pain Clinic she was starting up with after last visit with me. Follow-up by: Julieanne Manson MD,  November 25, 2009 10:49 AM  Additional Follow-up for Phone Call Additional follow up Details #1::        pt says she is not in the pain clinic anymore... pt says after second office visit they discharge her because the meds was not in her system.Marland KitchenMarland KitchenAdna [name] says she found another pain clinic but needs records from [place] and other pain clinic...  pt wants a xray on her hip to see why she is in so much pain... we need release form to get records from pain clinic Additional Follow-up by: Arm[place] Shannon,  November 25, 2009 11:21 AM    Additional Follow-up for Phone Call Additional follow up Details #2::    I will not prescribe her any controlled substances. She will need to make an appt. to discuss any problems, but no controlled substances will be prescribed. If she needs records sent, to come in and sign a release.  Julieanne Manson MD  November 26, 2009 2:35 PM    pt would like to know if she can get her muscle relaxers..pt feels as if provider is not helping her at all knowing how bad she is in pain pt says can you prescribe some nerve pills because she don't go back to mental health til dec 23.Marland KitchenMarland KitchenMarland KitchenArmenia Shannon  November 26, 2009 2:59 PM  The answer is no as I  have already stated.  Those are controlled substances.  Julieanne Manson MD  November 26, 2009 3:01 PM     Appended Document: Muscel Relaxers are due pt is aware and made appt to speak with provider

## 2010-02-10 NOTE — Assessment & Plan Note (Signed)
Summary: surgery update      Current Allergies: ! CODEINE  Past Surgical History:    1. 11/28/07:   Left Total knee replacement  Dr. Jerl Santos        Complete Medication List: 1)  Diflucan 100 Mg Tabs (Fluconazole) .... 2 tabs by mouth today, then 1 tab by mouth daily for 14 days. 2)  Citalopram Hydrobromide 20 Mg Tabs (Citalopram hydrobromide) .Marland Kitchen.. 1 tab by mouth daily 3)  Diazepam 2 Mg Tabs (Diazepam) .Marland Kitchen.. 1-2 by mouth by mouth once daily as needed anxiety 4)  Zithromax 250 Mg Tabs (Azithromycin) .... Z-pack - dispense as directed 5)  Prednisone 10 Mg Tabs (Prednisone) .... Taper from 60mg  to 0 mg over 7 days 6)  Proventil Hfa 108 (90 Base) Mcg/act Aers (Albuterol sulfate) .... 2 puffs every 6 hours as needed for shortness of breath    ]

## 2010-02-10 NOTE — Progress Notes (Signed)
Summary: Medication refill   Phone Note Call from Patient Call back at Home Phone [phone] Call back at [phone]   Summary of Call: Pt called in because she needs more medical refills from vistaril and percocet; she also wants to mention to the provider that the percocet 5 is not helping her so she needs something stronger.  Pt wants to make sure if the medication can be ready by Friday this week because she is going to be out of the town.   M[place] MD Initial call taken by: Manon Hilding,  October 23, 2008 10:12 AM  Follow-up for Phone Call        Both meds were last filled 09/27/08.  Will check with Dr. Delrae Alfred to see if percocet can be increased. Follow-up by: Vesta Mixer CMA,  October 23, 2008 12:41 PM  Additional Follow-up for Phone Call Additional follow up Details #1::        Pain medication will not be increased.  She can pick up Rx on Friday. When pt. comes in to pick up on Friday, would like a urine tox screen as per our pain contract agreement. Additional Follow-up by: Julieanne Manson MD,  October 23, 2008 2:32 PM    Additional Follow-up for Phone Call Additional follow up Details #2::    PATIENT IS AWARE THAT SHE CAN PICK UP HER SCRIPT FRIDAY AND HER MEDS WILL NOT BE INCREASED Follow-up by: Leodis Rains,  October 24, 2008 2:37 PM  Prescriptions: PERCOCET 5-325 MG TABS (OXYCODONE-ACETAMINOPHEN) 1 tab by mouth two times a day  #60 x 0   Entered and Authorized by:   Julieanne Manson MD   Signed by:   Julieanne Manson MD on 10/25/2008   Method used:   Print then Give to Patient   RxID:   [id number]

## 2010-02-10 NOTE — Progress Notes (Signed)
Summary: stomach virus   Phone Note Call from Patient Call back at [facility] Phone [phone]   Caller: Patient Call For: [phone] Summary of Call: The patient wants to be seen today if that is possible because she has chills, headache, and vomiting constantly.  She also is feeling without energy. Dr. Delrae Alfred Initial call taken by: Manon Hilding,  April 05, 2007 11:58 AM  Follow-up for Phone Call        Appt Scheduled for thursday morning.  Left message on answering machine for pt to return call.                               pt did not return call.  Appt cancelled. Follow-up by: Vesta Mixer CMA,  April 05, 2007 4:44 PM  Additional Follow-up for Phone Call Additional follow up Details #1::        ha, has not thrown up since yesterday, no diarrhea she is drinking ginger ale with no problem, advised to try BRAT diet and tylenol for HA. Call back in 24 hours if no better. Additional Follow-up by: Vesta Mixer CMA,  April 06, 2007 10:05 AM    Additional Follow-up for Phone Call Additional follow up Details #2::    agree with plan Follow-up by: Julieanne Manson MD,  April 06, 2007 5:25 PM
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
## 2010-02-10 NOTE — Miscellaneous (Signed)
Summary: Orders Update   Clinical Lists Changes  Orders: Added new Referral order of Surgical Referral (Surgery) - Signed 

## 2010-02-10 NOTE — Progress Notes (Signed)
Summary: med refill   Phone Note Call from Patient Call back at Home Phone [phone] Call back at [phone]   Summary of Call: the pt needs more medical refills from her oxycodone and Visteril medications.  ([facility] -Radio broadcast assistant)  She said the visteral helps a lot. Mulberry MD Initial call taken by: Manon Hilding,  August 22, 2008 2:11 PM  Follow-up for Phone Call        Last got each filled on 07/26/08  To be filled on 08/25/2008. n.martin,fnp August 22, 2008  Follow-up by: Vesta Mixer CMA,  August 22, 2008 2:40 PM  Additional Follow-up for Phone Call Additional follow up Details #1::        May pick up tomorrow, the 16th Additional Follow-up by: Julieanne Manson MD,  August 25, 2008 4:04 PM    Additional Follow-up for Phone Call Additional follow up Details #2::    pt aware to pick up meds today. Follow-up by: Vesta Mixer CMA,  August 26, 2008 2:23 PM  Prescriptions: VISTARIL 25 MG CAPS (HYDROXYZINE PAMOATE) 1 cap by mouth daily as needed anxiety  #15 x 0   Entered and Authorized by:   Julieanne Manson MD   Signed by:   Julieanne Manson MD on 08/25/2008   Method used:   Print then Give to Patient   RxID:   [id number] PERCOCET 5-325 MG TABS (OXYCODONE-ACETAMINOPHEN) 1 tab by mouth two times a day  #60 x 0   Entered and Authorized by:   Julieanne Manson MD   Signed by:   Julieanne Manson MD on 08/25/2008   Method used:   Print then Give to Patient   RxID:   [id number]

## 2010-02-10 NOTE — Letter (Signed)
Summary: PT REQUESTING RECORDS FROM [facility]  PT REQUESTING RECORDS FROM [facility]   Imported By: Arta Bruce 12/09/2009 15:40:36  _____________________________________________________________________  External Attachment:    Type:   Image     Comment:   External Document

## 2010-02-10 NOTE — Progress Notes (Signed)
   Phone Note Call from Patient Call back at [facility] Phone [phone]   Caller: Patient Summary of Call: The pt is having urinary track infection and she would like to be seen today if that is possible. Dr. Delrae Alfred Initial call taken by: Manon Hilding,  December 19, 2007 2:01 PM  Follow-up for Phone Call        telephone number disconnected. Follow-up by: Mikey College CMA,  December 20, 2007 2:13 PM  Additional Follow-up for Phone Call Additional follow up Details #1::        please see next dated phone note. Additional Follow-up by: Julieanne Manson MD,  December 27, 2007 11:11 AM

## 2010-02-10 NOTE — Letter (Signed)
Summary: [facility]  [facility]   Imported By: Arta Bruce 07/31/2008 12:39:08  _____________________________________________________________________  External Attachment:    Type:   Image     Comment:   External Document

## 2010-02-10 NOTE — Miscellaneous (Signed)
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

## 2010-02-10 NOTE — Progress Notes (Signed)
Summary: referrals   Phone Note Call from Patient Call back at Home Phone [phone])   Call For: [phone] Summary of Call: The pt wants the medical assistant call her back because is very important; the pt didn't give any additional information. Dr. Delrae Alfred Initial call taken by: Manon Hilding,  February 19, 2008 3:22 PM  Follow-up for Phone Call        Spoke with pt she needs to go see two specialist.  Dr. Shon Baton that did her back surgery wants her to see Dr. Aurelio Jew for a breast reduction and thinks this may help her back pain and the rash she gets under her breast also.  She will have Dr. Shon Baton fax records and recomendatio for surgery.  Pt. also requesting referral to gyn also that she normally goes to  [phone] Dr. Claretha Cooper across from [facility].  Pt has medicaid now and wants to continue to go there. Follow-up by: Vesta Mixer CMA,  February 19, 2008 4:38 PM  Additional Follow-up for Phone Call Additional follow up Details #1::        will leave for PCP to review and determine if pt needs referral Additional Follow-up by: Lehman Prom FNP,  February 19, 2008 5:17 PM    Additional Follow-up for Phone Call Additional follow up Details #2::    Need Dr. Shon Baton and Dr. Clarnce Flock records before making a decision--pt. to arrange.  Please let her know. Follow-up by: Julieanne Manson MD,  February 22, 2008 8:37 AM  Additional Follow-up for Phone Call Additional follow up Details #3:: Details for Additional Follow-up Action Taken: left message for pt on voice mail to make sure she is getting records here.  Pt identified self on voice mail. Additional Follow-up by: Vesta Mixer CMA,  February 27, 2008 10:29 AM

## 2010-02-10 NOTE — Letter (Signed)
Summary: [facility]   Imported By: Arta Bruce 07/24/2007 11:27:20  _____________________________________________________________________  External Attachment:    Type:   Image     Comment:   External Document

## 2010-02-10 NOTE — Progress Notes (Signed)
Summary: Michelle [name] NEEDS 2 REFERRALS   Phone Note Call from Patient Call back at Home Phone [phone]   Reason for Call: Referral Summary of Call: [name] PT, MS Godman SAYS HER LAWYER IS FILING FOR AN APPEAL AND HE WANT HER TO GET A REFERRL TO GO BACK TO HER BACK DR AND HER KNEE DR AT [facility]. Initial call taken by: Leodis Rains,  September 02, 2008 12:52 PM  Follow-up for Phone Call        Pt's phone is temp. out of service.  Dr. Delrae Alfred do you want to see the pt first prior to referring her anywhere? Follow-up by: Vesta Mixer CMA,  September 10, 2008 12:32 PM  Additional Follow-up for Phone Call Additional follow up Details #1::        Her lawyer needs to send me that request.   Also need to hear from lawyer why she needs to go back to them as I'm assuming they already sent in their records to the lawyer for previous hearing. Additional Follow-up by: Julieanne Manson MD,  September 10, 2008 10:49 PM    Additional Follow-up for Phone Call Additional follow up Details #2::    Tried to call new number [phone], but voice mail not set up yet. Follow-up by: Vesta Mixer CMA,  September 24, 2008 11:01 AM  Additional Follow-up for Phone Call Additional follow up Details #3:: Details for Additional Follow-up Action Taken: Pt states she fell a couple of months of ago and her back has been hurting more since then and she and her lawyer think she should be seen by her orthopedic since she fell. Pt also informed we will need letter from lawyer stating the same thing.......... Tiffany McCoy CMA  September 25, 2008 11:42 AM

## 2010-02-10 NOTE — Letter (Signed)
Summary: [facility]  [facility]   Imported By: Arta Bruce 07/31/2008 12:27:55  _____________________________________________________________________  External Attachment:    Type:   Image     Comment:   External Document

## 2010-02-10 NOTE — Miscellaneous (Signed)
Summary: DISCHARGE SUMMARY  DISCHARGE SUMMARY   Imported By: Arta Bruce 07/31/2008 12:44:27  _____________________________________________________________________  External Attachment:    Type:   Image     Comment:   External Document

## 2010-02-10 NOTE — Letter (Signed)
Summary: [facility]  [facility]   Imported By: Arta Bruce 08/06/2009 10:47:35  _____________________________________________________________________  External Attachment:    Type:   Image     Comment:   External Document

## 2010-02-10 NOTE — Letter (Signed)
Summary: [facility]  [facility]   Imported By: Arta Bruce 05/08/2008 12:55:42  _____________________________________________________________________  External Attachment:    Type:   Image     Comment:   External Document

## 2010-02-10 NOTE — Progress Notes (Signed)
Summary: NEED MEDS FOR HEARTBURN  Medications Added FAMOTIDINE 40 MG TABS (FAMOTIDINE) 1 tab by mouth at bedtime daily       Phone Note Call from Patient Call back at [facility] Phone [phone]   Summary of Call: [name] MS Brummond CALLED AND SAYS THAT YOU ALL WERE DISCUSING PREVIOUS MEDICINES THAT SHE WAS TAKING AND SHE SAYS THAT SHE IS HAVING A LOT OF HEARTBURN. ITS CAUSING HER TO COUGH, AND ITS COMING BACK UP THRU HER MOUTH AND NOSE.  SHE [facility]ES [facility] [address] Initial call taken by: Leodis Rains,  August 25, 2009 11:36 AM  Follow-up for Phone Call        Sent to Dr. Delrae Alfred.  Dutch Quint RN  August 25, 2009 3:05 PM   Additional Follow-up for Phone Call Additional follow up Details #1::        MS Noyce CALLED TO SEE IF DR MULBERRY WILL CALL IN HER NERVE PILLS (DIAZEPAM) BECAUSE HER NERVES ARE SHOT. Additional Follow-up by: Leodis Rains,  August 27, 2009 3:43 PM    Additional Follow-up for Phone Call Additional follow up Details #2::    We discussed the Diazepam at the last OV and if she needs Diazepam, she is to discuss with her psychiatrist if she feels she needs that. I am filling her Famoitidine for the heartburn--she stated she wasn't taking when I saw her at OV Follow-up by: Julieanne Manson MD,  August 27, 2009 8:59 PM  Additional Follow-up for Phone Call Additional follow up Details #3:: Details for Additional Follow-up Action Taken: Pt notified of above msg. Additional Follow-up by: Vesta Mixer CMA,  August 28, 2009 2:49 PM  New/Updated Medications: FAMOTIDINE 40 MG TABS (FAMOTIDINE) 1 tab by mouth at bedtime daily Prescriptions: FAMOTIDINE 40 MG TABS (FAMOTIDINE) 1 tab by mouth at bedtime daily  #30 x 6   Entered and Authorized by:   Julieanne Manson MD   Signed by:   Julieanne Manson MD on 08/27/2009   Method used:   Electronically to        [facility] (retail)       [address]  [place], [place]  [id number]       Ph: [phone]       Fax: [phone]   RxID:   [id number]

## 2010-02-10 NOTE — Letter (Signed)
Summary: TEST Michelle [name] /APPT DATE & TIME  TEST Michelle [name] /APPT DATE & TIME   Imported By: Arta Bruce 05/03/2008 10:00:28  _____________________________________________________________________  External Attachment:    Type:   Image     Comment:   External Document

## 2010-02-10 NOTE — Letter (Signed)
Summary: *HSN Results Follow up  [facility]  [address] [place], [place] [address]   Phone: [phone]  Fax: [phone]      09/29/2007   [facility] [address] [place], [place]  [address]   Dear  Ms. NANETTE Harr,                            ____S.Drinkard,FNP   ____D. Gore,FNP       ____B. McPherson,MD   ____V. Rankins,MD    __x__E. Mulberry,MD    ____N. Daphine Deutscher, FNP  ____D. Reche Dixon, MD    ____K. Philipp Deputy, MD    ____Other     This letter is to inform you that your recent test(s):  _______Pap Smear    _______Lab Test     _______X-ray    _______ is within acceptable limits  _______ requires a medication change  _______ requires a follow-up lab visit  _______ requires a follow-up visit with your provider   Comments: We tried to call you several times.  If you still need [place] please call back.  Thank You.      _________________________________________________________ If you have any questions, please contact our office                     Sincerely,  Vesta Mixer CMA [facility]

## 2010-02-10 NOTE — Assessment & Plan Note (Signed)
Summary: hospital follow up/ both legs swollen/gk   Vital Signs:  Patient profile:   45 year old female Menstrual status:  irregular Weight:      295.44 pounds BMI:     43.16 Temp:     97.6 degrees F Pulse rate:   92 / minute Pulse rhythm:   regular Resp:     16 per minute BP sitting:   126 / 82  (left arm) Cuff size:   large  Vitals Entered By: Chauncy Passy, SMA CC: Pt. is here for a f/u from the ER dept. last week. Pt. was complaining of swollen legs. Pt. states this was going on for about 2 weeks. This is causing her pain. Would also like a ref. to see an orthopedic. Is Patient Diabetic? No Pain Assessment Patient in pain? yes     Location: legs Intensity: 8-9 Type: aching Onset of pain  Constant  Does patient need assistance? Functional Status Self care Ambulation Normal   CC:  Pt. is here for a f/u from the ER dept. last week. Pt. was complaining of swollen legs. Pt. states this was going on for about 2 weeks. This is causing her pain. Would also like a ref. to see an orthopedic.Marland Kitchen  History of Present Illness: 1.  Lower extremity edema:  Past 2 weeks--worsened over week of 20th.   Has been to ED regarding this as was told secondary to varicosities.  Cannot recall being on feet more than usual.  Was at a flea market the weekend it worsened.  Cannot say she was eating saltier foods.  Has not obtained compression stockings.  Father had similar problems with varicose veins.  CXR from 03/31/09 showed chronic bronchitic changes, but was otherwise normal.  2.  Pain in right low back and radiating down lateral right leg--very painful.  Has been unable to get in with Dr. Shon Baton as she owes money.  Has also been to [employer] wondering if she could be seen there for this.  Apparently, has not been addressed at  pain clinic.  Found xrays from 11/10 showing stable L5/S1 discectomy and fixation.   3. Tobacco Abuse:  has not obtained nicotine patches yet--"Too much going  on"  Daughter told her she wanted her to die.   Current Medications (verified): 1)  Neurontin 300 Mg Caps (Gabapentin) .... 2 Caps in Am, 1 in Afternoon and 2 Caps By Mouth in Evening.  [facility]. 2)  Cymbalta 60 Mg Cpep (Duloxetine Hcl) .Marland Kitchen.. 1 By Mouth Once Daily  Dr. [facility].  Also See Zenia Resides 3)  Famotidine 40 Mg Tabs (Famotidine) .Marland Kitchen.. 1 Tab By Mouth Daily As Needed Heartburn 4)  Cortisporin-Tc 3.03-13-08-0.5 Mg/ml Susp (Neomycin-Colist-Hc-Thonzonium) .... 3-4 Drops Right Ear Qid For 5 Days. 5)  Vistaril 25 Mg Caps (Hydroxyzine Pamoate) .Marland Kitchen.. 1 Cap By Mouth Daily As Needed Anxiety 6)  Ketoconazole 2 % Crea (Ketoconazole) .... Apply Two Times A Day To Affected Area For 2 Weeks 7)  Percocet 10-325 Mg Tabs (Oxycodone-Acetaminophen) .Marland Kitchen.. 1 Tab By Mouth Three Times A Day --[facility] Clinic--Dr. Dagwa 8)  Zanaflex 4 Mg Caps (Tizanidine Hcl) .Marland Kitchen.. 1 Cap By Mouth Every 12 Hours--Pain Clinic 9)  Xanax 0.5 Mg Tabs (Alprazolam) .Marland Kitchen.. 1 Tab By Mouth At [facility]  Allergies (verified): 1)  ! Codeine 2)  ! Penicillin  Physical Exam  General:  Obese, NAD Lungs:  Scattered wheeze at bilateral lung base Heart:  Normal rate and regular rhythm. S1  and S2 normal without gallop, murmur, click, rub or other extra sounds.  Radial pulses normal and equal Msk:  Tender over right lumbosacral paraspinous musculature Extremities:  Pitting edema of legs to mid pretibial area--mild.  Broken small superficial veins scattered on legs and varicosities of large subcutaneous veins. Neurologic:  strength normal in all extremities and DTRs symmetrical and normal.     Impression & Recommendations:  Problem # 1:  PERIPHERAL EDEMA (ICD-782.3)  Knee high compression stockings given. Her updated medication list for this problem includes:    Furosemide 20 Mg Tabs (Furosemide) .Marland Kitchen... 1 tab by mouth in morning  Orders: Elastic Support Stocking - Knee High  (L8100)  Problem # 2:  TOBACCO ABUSE (ICD-305.1) Encouraged quiiting Albuterol MDE for chronic bronchitis/wheeze  Problem # 3:  DISC DISEASE, LUMBOSACRAL SPINE (ICD-722.52)  Orders: Orthopedic Surgeon Referral (Ortho Surgeon)--[facility]  Complete Medication List: 1)  Neurontin 300 Mg Caps (Gabapentin) .... 2 caps in am, 1 in afternoon and 2 caps by mouth in evening.  [facility]. 2)  Cymbalta 60 Mg Cpep (Duloxetine hcl) .Marland Kitchen.. 1 by mouth once daily  dr. sanders-[facility].  also see ann miller 3)  Famotidine 40 Mg Tabs (Famotidine) .Marland Kitchen.. 1 tab by mouth daily as needed heartburn 4)  Cortisporin-tc 3.03-13-08-0.5 Mg/ml Susp (Neomycin-colist-hc-thonzonium) .... 3-4 drops right ear qid for 5 days. 5)  Vistaril 25 Mg Caps (Hydroxyzine pamoate) .Marland Kitchen.. 1 cap by mouth daily as needed anxiety 6)  Ketoconazole 2 % Crea (Ketoconazole) .... Apply two times a day to affected area for 2 weeks 7)  Percocet 10-325 Mg Tabs (Oxycodone-acetaminophen) .Marland Kitchen.. 1 tab by mouth three times a day --pain clinic--dr. dagwa 8)  Zanaflex 4 Mg Caps (Tizanidine hcl) .Marland Kitchen.. 1 cap by mouth every 12 hours--pain clinic 9)  Xanax 0.5 Mg Tabs (Alprazolam) .Marland Kitchen.. 1 tab by mouth at bedtime--pain clinic 10)  Furosemide 20 Mg Tabs (Furosemide) .Marland Kitchen.. 1 tab by mouth in morning 11)  K-tabs 10 Meq Cr-tabs (Potassium chloride) .Marland Kitchen.. 1 tab by mouth by mouth daily when take furosemide 12)  Ventolin Hfa 108 (90 Base) Mcg/act Aers (Albuterol sulfate) .... 2 puffs every 4 hours as needed for wheeze.  Patient Instructions: 1)  No added salt diet 2)  Keep legs elevated when sitting and recline  3)  If on feet--Move--don't stand still 4)  BMET in 2 weeks --V58.69.  Nursing visit. 5)  PUt compression stockings on first thing in morning and take off at bedtime 6)  Follow up with Dr. Delrae Alfred in 4 months --perpheral edema Prescriptions: VENTOLIN HFA 108 (90 BASE) MCG/ACT AERS (ALBUTEROL SULFATE) 2 puffs  every 4 hours as needed for wheeze.  #1 x 1   Entered and Authorized by:   Julieanne Manson MD   Signed by:   Julieanne Manson MD on 04/08/2009   Method used:   Electronically to        [facility] [address] (retail)       [address]       [place], [place]  [id number]       Ph: [phone]       Fax: [phone]   RxID:   [id number] K-TABS 10 MEQ CR-TABS (POTASSIUM CHLORIDE) 1 tab by mouth by mouth daily when take Furosemide  #30 x 4   Entered and Authorized by:   Julieanne Manson MD   Signed by:   Julieanne Manson MD on 04/08/2009   Method used:   Electronically to  [facility] (retail)       [address]       [place], [place]  [id number]       Ph: [phone]       Fax: [phone]   RxID:   [id number] FUROSEMIDE 20 MG TABS (FUROSEMIDE) 1 tab by mouth in morning  #30 x 4   Entered and Authorized by:   Julieanne Manson MD   Signed by:   Julieanne Manson MD on 04/08/2009   Method used:   Electronically to        [facility] [address] (retail)       [address]       [place], [place]  [id number]       Ph: [phone]       Fax: [phone]   RxID:   [id number]

## 2010-02-10 NOTE — Letter (Signed)
Summary: [facility]   Imported By: Arta Bruce 10/02/2009 10:43:10  _____________________________________________________________________  External Attachment:    Type:   Image     Comment:   External Document

## 2010-02-10 NOTE — Assessment & Plan Note (Signed)
Summary: RENEW MEDICATIONS//GK   Vital Signs:  Patient profile:   45 year old female Menstrual status:  irregular Weight:      288 pounds Temp:     97.3 degrees F Pulse rate:   70 / minute Pulse rhythm:   regular Resp:     18 per minute BP sitting:   110 / 72  (left arm) Cuff size:   large  Vitals Entered By: Vesta Mixer CMA (August 22, 2009 4:50 PM) CC: reestablish care Is Patient Diabetic? Yes CBG Result 104  Does patient need assistance? Ambulation Normal   CC:  reestablish care.  History of Present Illness: 45 yo female here for reestablish.  States filled out release forms, but we have not received any records from either [facility] or [facility].    45.  states she is scheduled to go to [facility] on [address].  on Sept 2nd--consultation then.    1.  Depression/Anxiety:  pt. wants Diazepam filled for her "nerves", but [facility] has told her they will not fill unless she comes off something else and she feels Cymbalta and Neurontin are working well for her currently.    2.  Chronic pain syndrome:  Knee, neck and back.  Discussed will not waiver from pain contract as pt. has been jumping from one provider from another.  Is to be seen by above Pain Clinic in a month.  Pt. tells me she was on Opana at one point from [facility], but sounds like she did not provide a urine specimen in a timely fashion and she was discharged.    3.  DM:  Checking sugars--generally running from low to mid 100s.  Pt. has really tried to make lifestyle changes, especially with eating habits.  Trying to be physically active, though just had carpal tunnel release on the right and that has slowed things down.  4.  Carpal Tunnel Syndrome: Release with Dr. Channing Mutters as above --08/15/09  5.  Psoriasis:  pt. states diagnosed with psoriasis at [facility]--treated with Betamethasone and has significantly improved.  Has noted for past 4 months, thickened yellowed nails with pincer  deformity.  Has significantly worsened over past 2 months.  Allergies (verified): 1)  ! Codeine 2)  ! Penicillin  Past History:  Past Surgical History: 1. 11/28/07:   Left Total knee replacement  Dr. Jerl Santos 2.  12/05/06:  Lumbar surgery--L5-S1 fusion.  Dr. Shon Baton. 3.  3 arthroscopic surgeries on knees--twice on left and one on right in years past 4.  1986 and 1991:  C/S 5.  1991 Laparoscopic Cholecystectomy 6.  1994 and 2000:  Cspine surgery  Dr. Channing Mutters 7.  08/15/09:  Right Carpal Tunnel Release with Dr. Bing Quarry better.  Plan for left release in future.  Physical Exam  Lungs:  Normal respiratory effort, chest expands symmetrically. Lungs are clear to auscultation, no crackles or wheezes. Heart:  Normal rate and regular rhythm. S1 and S2 normal without gallop, murmur, click, rub or other extra sounds. Skin:  Finger nails all with distal thickening and yellow dicoloration--several with pincer deformity.  No obvious pitting. Right index fingernail polish cleaned away and portion removed for fungal culture.   Impression & Recommendations:  Problem # 1:  PSORIASIS (ICD-696.1) Await records from [facility]  Problem # 2:  CARPAL TUNNEL SYNDROME, BILATERAL (ICD-354.0) Recent right release--feeling better.   Plans for release in near future on left.  Problem # 3:  DIABETES MELLITUS, TYPE II, CONTROLLED (  ICD-250.00) A1C now in normal range with pt's lifestyle changes. Discussed needs to continue to consider herself a diabetic as if she resumes old ways of eating, will redevelop elevated sugars. 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 involvement with psoriasis  Orders: T- * Misc. Laboratory test [phone]) nail fungal culture.  Problem # 5:  DISC DISEASE, LUMBOSACRAL SPINE (ICD-722.52) Knee pain/neck pain/ chronic pain syndrome Pt. signed another pain contract today. Discussed would fill just this  month if starts with  new pain clinic or if do not receive records from [facility] and [facility] If any concern when receive old records other than what pt. describes today with [facility], will no longer fill pain meds Very clear there will be no bending of rules--if urine sample for tox screen ordered, and she does not provide, will no longer prescribe  Pt. asking for benzos today--discussed we would not provide those here as well--she needs to discuss with [facility]  Problem # 6:  DEPRESSION (ICD-311) Pt.feels her current mix of meds controls depression and anxiety well, then asks for a prescription for Xanax--denied.   See above. The following medications were removed from the medication list:    Vistaril 25 Mg Caps (Hydroxyzine pamoate) .Marland Kitchen... 1 cap by mouth daily as needed anxiety    Xanax 0.5 Mg Tabs (Alprazolam) .Marland Kitchen... 1 tab by mouth at bedtime--pain clinic Her updated medication list for this problem includes:    Cymbalta 60 Mg Cpep (Duloxetine hcl) .Marland Kitchen... 1 by mouth once daily  dr. Gaston Islam brown-[facility].  also see ann miller    Trazodone Hcl 50 Mg Tabs (Trazodone hcl) .Marland Kitchen... 1/2 -1 by mouth at bedtime as needed-[facility]--dr. loyce brown.  Complete Medication List: 1)  Neurontin 300 Mg Caps (Gabapentin) .... 2 caps in am, 1 in afternoon and 2 caps by mouth in evening.  [facility].  dr. Gaston Islam brown 2)  Cymbalta 60 Mg Cpep (Duloxetine hcl) .Marland Kitchen.. 1 by mouth once daily  dr. Gaston Islam brown-[facility].  also see ann miller 3)  Furosemide 20 Mg Tabs (Furosemide) .Marland Kitchen.. 1 tab by mouth in morning 4)  Klor-con 10 10 Meq Cr-tabs (Potassium chloride) .Marland Kitchen.. 1 tab by mouth daily 5)  Ventolin Hfa 108 (90 Base) Mcg/act Aers (Albuterol sulfate) .... 2 puffs every 4 hours as needed for wheeze. 6)  Nicotine 21 Mg/24hr Pt24 (Nicotine) .... Apply topically to skin daily for smoking 7)  Methocarbamol 750 Mg Tabs (Methocarbamol) .Marland Kitchen.. 1 by mouth  4 times daily as needed for back pain and right leg pain 8)  Trazodone Hcl 50 Mg Tabs (Trazodone hcl) .... 1/2 -1 by mouth at bedtime as needed-[facility]--dr. loyce brown. 9)  Oxycodone-acetaminophen 5-325 Mg Tabs (Oxycodone-acetaminophen) .Marland Kitchen.. 1 tab by mouth two times a day as needed pain 10)  Famotidine 40 Mg Tabs (Famotidine) .Marland Kitchen.. 1 tab by mouth at bedtime daily  Patient Instructions: 1)  Need to contact [facility] and make sure we get their records before one month is up to refill oxycodone--otherwise this is a one time fill. 2)  CPP in 3 months with Dr. Delrae Alfred Prescriptions: KLOR-CON 10 10 MEQ CR-TABS (POTASSIUM CHLORIDE) 1 tab by mouth daily  #30 x 6   Entered and Authorized by:   Julieanne Manson MD   Signed by:   Julieanne Manson MD on 08/22/2009   Method used:   Electronically to  [facility] (retail)       [address]       [place], [place]  [id number]       Ph: [phone]       Fax: [phone]   RxID:   [id number] OXYCODONE-ACETAMINOPHEN 5-325 MG TABS (OXYCODONE-ACETAMINOPHEN) 1 tab by mouth two times a day as needed pain  #60 x 0   Entered and Authorized by:   Julieanne Manson MD   Signed by:   Julieanne Manson MD on 08/22/2009   Method used:   Print then Give to Patient   RxID:   [id number]

## 2010-02-10 NOTE — Progress Notes (Signed)
Summary: TEST RESULTS   Phone Note Call from Patient   Summary of Call: Zebedee Segundo PT. CALLED TO GET HER SKIN TEST RESULTS FROM [facility] UNDER HER BREAST. Initial call taken by: Leodis Rains,  March 27, 2007 11:06 AM  Follow-up for Phone Call        pt aware results will take up to 4 weeks Follow-up by: Vesta Mixer CMA,  March 27, 2007 12:32 PM

## 2010-02-10 NOTE — Letter (Signed)
Summary: Triage call a Nurse  Triage call a Nurse   Imported By: Arta Bruce 12/16/2009 09:59:14  _____________________________________________________________________  External Attachment:    Type:   Image     Comment:   External Document

## 2010-02-10 NOTE — Progress Notes (Signed)
Summary: ear infection   Phone Note Call from Patient Call back at [facility] Phone [phone] Call back at [phone]   Summary of Call: Pt has a terrible pain in her righ ear and is swollen inside and she wants to know if the physician can called in for antibiotics.  [facility] [place]) Delrae Alfred MD  Initial call taken by: Manon Hilding,  March 12, 2009 11:27 AM  Follow-up for Phone Call        Pt's right ear is slightly red and swollen on the exteral part and causing pt some pain.  She wonders if it is MRSA, but no obvious boil or drainage.  Would  like antibx if possible sent to [facility].  Pt saw me while I was out for lunch and showed me her ear. Follow-up by: Vesta Mixer CMA,  March 13, 2009 2:16 PM  Additional Follow-up for Phone Call Additional follow up Details #1::        OV--see if we can work in.  Julieanne Manson MD  March 14, 2009 8:09 AM   The pt went to the Urgent Care last night and they diagnosed her Ottitis Canal Infection and they gave her a shot and a prescription for ear drop and antibiotics. the provider told her to keep her ear dry.  Pt do not need an ov now.   Pt was very upset because she didn't received the care she need at the time, but she might need a ear/throat specialist..  Pt is pushing to change provider, although she likes to come here,.Marland Kitchen Marland KitchenManon Hilding  March 14, 2009 8:18 AM    Additional Follow-up for Phone Call Additional follow up Details #2::    Called and spoke with pt at length. Discussed office was closed Wednesday afternoon and I was not in office Thursday afternoon. She had already been seen by time we tried to work in. Apologized that we were not able to work her in in a timely fashion. Follow-up by: Julieanne Manson MD,  March 15, 2009 1:08 PM

## 2010-02-10 NOTE — Progress Notes (Signed)
Summary: needs ov next available   Phone Note Call from Patient Call back at [facility] Phone [phone] Call back at [phone]   Summary of Call: [name] MS Henslee WENT TO THE ER THIS PAST SATURDAY. SHE HAS MCR ON HER STOMACH AND HAS A PIMPLE IN HER EAR. ON HER STOMACH SHE SAYS IT HURTS AND THE PIMPLE IN HER EAR IS MAKING HER HEAD HURT. THEY PRESCRIBED HER A ANITBIOTIC AND SOME PAIN MEDICATIONS AND TOLD HER TO CALL HERE MONDAY(TODAY) I WAS TRYING TO OFFER HER AN APPT. IN A CONTINGENCY SLOT, BUT SHE WANTS TO BE SEEN THIS WEEK. Initial call taken by: Leodis Rains,  March 11, 2008 1:05 PM  Follow-up for Phone Call        spoke with the pt and she was unable to talk because she was at denist office Follow-up by: [place] Shannon,  March 12, 2008 3:39 PM  Additional Follow-up for Phone Call Additional follow up Details #1::        Left message on answering machine for pt to call back..............Marland KitchenArmenia Shannon  March 15, 2008 11:45 AM  The pt was returning the phone called but you can call her back at [phone] .Marland KitchenManon Hilding  March 18, 2008 4:45 PM      Additional Follow-up for Phone Call Additional follow up Details #2::    spoke with pt and she is still taking her antibotics but she stated that she is doing ok...Marland Kitchen pt stated that she has been vomiting alot lately when she ate greesey foods... but she has appetiate has rebuilt.... pt says her ears feel alot better but it feels as if she has dry wax in it but when she sticks her nail in her...Marland KitchenMarland Kitchen pt stated that she violated Dr. Ethelene Hal when she recieve pain meds from Dr. Bethena Midget and would like to know if we can prescribe her some...Marland KitchenMarland Kitchen  pt stated that Dr. Shon Baton stated she needed a breast reduction can she have a referral for this to Dr. Shon Hough..... Dr. Shon Baton did give her a referral but she has medicad and  with our name on it Follow-up by: [place] Shannon,  March 20, 2008 12:01 PM  Additional Follow-up for Phone Call Additional follow  up Details #3:: Details for Additional Follow-up Action Taken: She needs to make an appt to be seen.  Not emergent--just next opening.  Julieanne Manson MD  March 21, 2008 8:43 AM   The pt has an appointment on April 16. Marland KitchenManon Hilding  March 25, 2008 9:19 AM

## 2010-02-10 NOTE — Assessment & Plan Note (Signed)
Summary: FEMALE PROBLEM////KT   Vital Signs:  Patient profile:   45 year old female Menstrual status:  irregular Weight:      296.7 pounds Temp:     97.5 degrees F Pulse rate:   71 / minute Pulse rhythm:   regular Resp:     20 per minute BP sitting:   114 / 76  (left arm) Cuff size:   large  Vitals Entered By: Vesta Mixer CMA (February 18, 2009 12:13 PM) CC: Thinks she may be going through menopause and thinks she has yeast infection from antib for mrsa last month. Is Patient Diabetic? No Pain Assessment Patient in pain? yes     Location: back/legs Intensity: 8  Does patient need assistance? Ambulation Normal   CC:  Thinks she may be going through menopause and thinks she has yeast infection from antib for mrsa last month..  History of Present Illness: 1.  Hot flashes, irritability, vaginal discharge.  Had intercourse about 2 months ago and hurt terribly to point of tears.  Feels like she is without any lubricant with intercourse--felt dry.  Discharge is white with a mild odor she cannot get rid of.  No period in 2 months.  Has been on and off with periods for about 1 year.  Longest episode of missing period was 3-4 months back in summer.  Feels she can deal with symptoms, just wants to know what is going on.  Still smoking.  2.  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  Interested in trying Chantix.    3.  Depression:  difficulties with grandchildren and whether they are being well cared for by her children.   4.  Chronic Pain:  followed by Dr. Aleene Davidson at Pain clinic.  med list updated.     5.  Right ear canal itching and pain.  Using Qtips to scratch at it.      Habits & Providers  Alcohol-Tobacco-Diet     Tobacco Status: current     Cigarette Packs/Day: 1/2 ppd     Year Started: age 42  Allergies (verified): 1)  ! Codeine  Social History: Packs/Day:  1/2 ppd  Physical Exam  General:  NAD Ears:  TMs pearly gray.  bilateral  canals devoid of cerumen and flaky dry. Lungs:  Scattered occasional wheeze.  Decreased BS Heart:  Normal rate and regular rhythm. S1 and S2 normal without gallop, murmur, click, rub or other extra sounds.   Impression & Recommendations:  Problem # 1:  PRURITUS, EARS (ICD-698.9) Discussed stopping use of Qtips in ears to allow some cerumen formation.   No evidence of external infection at this point, but discussed at risk of developing with chronic scratching/insutrumentation of canals.  Problem # 2:  TOBACCO ABUSE (ICD-305.1) Discussed options:  with psych issues, would not recommend first line with Chantix or Wellbutrin.   Pt. to obtain nicotine patches--feels her church will help her out with this.  Problem # 3:  MENOPAUSAL SYNDROME (ICD-627.2) Discussed risk/benefits of ERT. Patient not a good candidate for even 5 year treatment with smoking and weight alone as cardiac disease risk factors. Astrolglide for lubricant with intercourse  Complete Medication List: 1)  Neurontin 300 Mg Caps (Gabapentin) .... 2 caps in am, 1 in afternoon and 2 caps by mouth in evening.  [facility]. 2)  Cymbalta 60 Mg Cpep (Duloxetine hcl) .Marland Kitchen.. 1 by mouth once daily  dr. sanders-[facility].  also see  ann miller 3)  Famotidine 40 Mg Tabs (Famotidine) .Marland Kitchen.. 1 tab by mouth daily as needed heartburn 4)  Cortisporin-tc 3.03-13-08-0.5 Mg/ml Susp (Neomycin-colist-hc-thonzonium) .... 3-4 drops right ear qid for 5 days. 5)  Vistaril 25 Mg Caps (Hydroxyzine pamoate) .Marland Kitchen.. 1 cap by mouth daily as needed anxiety 6)  Ketoconazole 2 % Crea (Ketoconazole) .... Apply two times a day to affected area for 2 weeks 7)  Percocet 10-325 Mg Tabs (Oxycodone-acetaminophen) .Marland Kitchen.. 1 tab by mouth three times a day --pain clinic--dr. dagwa 8)  Zanaflex 4 Mg Caps (Tizanidine hcl) .Marland Kitchen.. 1 cap by mouth every 12 hours--pain clinic 9)  Xanax 0.5 Mg Tabs (Alprazolam) .Marland Kitchen.. 1 tab by mouth at bedtime--pain  clinic  Patient Instructions: 1)  Start with 21 mg Nicotine patches and change daily for 1 month--then dicrease to 14 mg for 2 weeks, then 7 mg for 2 weeks.

## 2010-02-10 NOTE — Letter (Signed)
Summary: *HSN Results Follow up  [facility]  [address] [place], [place] [address]   Phone: [phone]  Fax: [phone]      01/09/2008   [facility] [address] [place], [place]  [address]   Dear  Ms. Michelle [name],                            ____S.Drinkard,FNP   ____D. Gore,FNP       ____B. McPherson,MD   ____V. Rankins,MD    __x__E. Mulberry,MD    ____N. Daphine Deutscher, FNP  ____D. Reche Dixon, MD    ____K. Philipp Deputy, MD    ____Other     This letter is to inform you that your recent test(s):  _______Pap Smear    _______Lab Test     _______X-ray    _______ is within acceptable limits  _______ requires a medication change  _______ requires a follow-up lab visit  _______ requires a follow-up visit with your provider   Comments: We have tried to reach you several times, if we can still be of assistance please call us back.  Thank You.      _________________________________________________________ If you have any questions, please contact our office                     Sincerely,  Vesta Mixer CMA [facility]

## 2010-02-10 NOTE — Letter (Signed)
Summary: [facility]  [facility]   Imported By: Arta Bruce 05/08/2008 12:59:24  _____________________________________________________________________  External Attachment:    Type:   Image     Comment:   External Document

## 2010-02-10 NOTE — Progress Notes (Signed)
  Phone Note Outgoing Call   Summary of Call: Rec'd fax from [facility] on [address]. Tessalon Perles not covered by medicaid.  Fax states no cough meds are covered. She can go get the [employer] at [place]. . . they are $4 there.  Initial call taken by: Brynda Rim,  October 23, 2009 2:26 PM  Follow-up for Phone Call        PT NOTIFIED TO PICK RX UP AND TAKE TO ANY [facility] IT IS ON THE $4 DOLLAR PLAN. PT STATED SHE UNDERSTOOD AND WOULD DO THAT. Follow-up by: Hassell Halim CMA,  October 23, 2009 4:30 PM

## 2010-02-10 NOTE — Letter (Signed)
Summary: [facility]  [facility]   Imported By: Arta Bruce 07/31/2008 12:31:45  _____________________________________________________________________  External Attachment:    Type:   Image     Comment:   External Document

## 2010-02-10 NOTE — Letter (Signed)
Summary: AGREEMENT FOR CONTROLLED SUBSTANCE  AGREEMENT FOR CONTROLLED SUBSTANCE   Imported By: Arta Bruce 08/25/2009 08:44:38  _____________________________________________________________________  External Attachment:    Type:   Image     Comment:   External Document

## 2010-02-10 NOTE — Letter (Signed)
Summary: [facility]  [facility]   Imported By: Arta Bruce 10/02/2009 10:47:21  _____________________________________________________________________  External Attachment:    Type:   Image     Comment:   External Document

## 2010-02-10 NOTE — Letter (Signed)
Summary: REQUESTING RECORDS FROM [facility]  REQUESTING RECORDS FROM [facility]   Imported By: Arta Bruce 09/02/2009 12:34:56  _____________________________________________________________________  External Attachment:    Type:   Image     Comment:   External Document

## 2010-02-10 NOTE — Letter (Signed)
Summary: [facility]  [facility]   Imported By: Arta Bruce 11/17/2009 16:50:05  _____________________________________________________________________  External Attachment:    Type:   Image     Comment:   External Document

## 2010-02-10 NOTE — Progress Notes (Signed)
Summary: BACK IS IN A LOT OF PAIN  Phone Note Call from Patient Call back at Home Phone [phone]   Reason for Call: Talk to Nurse Summary of Call: Kaiyden Simkin PT. MS Ebers SAYS THAT HER BACK IS KILLING HER AND SHE CANT TAKE IT ANYMORE. SHE IS IN LOT OF PAIN AND HER NERVES ARE SHOT. Initial call taken by: Leodis Rains,  December 11, 2009 1:01 PM  Follow-up for Phone Call        Left message on voicemail for pt. to return call.  Dutch Quint RN  December 11, 2009 4:26 PM  Have long-standing back pain over the years, was going to pain clinic and has not had any pain medication.  Is in tears over the pain and "needs something" for pain.  Has been trying to get into another pain clinic and is having many family issues.  Is only taking Goody powders and ibuprofen, using moist heat and home remedies which are all ineffective.  Has had to go to ED for pain evaluation, last time about a month ago.  States she is in serious need of pain relief.  Asks that you please give her something for the pain, greater than the Percocet 5/325.  Has appt. to see you 01/08/10.  Follow-up by: Dutch Quint RN,  December 11, 2009 4:50 PM  Additional Follow-up for Phone Call Additional follow up Details #1::        please call patient to [phone] Additional Follow-up by: Domenic Polite,  December 12, 2009 2:17 PM    Additional Follow-up for Phone Call Additional follow up Details #2::    MS Perales CALLED AGAIN.Cala Bradford Tinnin  December 12, 2009 2:52 PM  We will no longer fill controlled substances for her--period.  Julieanne Manson MD  December 16, 2009 8:37 AM    pt is aware via theresa... Armenia shannon CMA  December 16, 2009 2:17 PM \\par 

## 2010-02-10 NOTE — Letter (Signed)
[facility]   Imported By: Arta Bruce 10/02/2009 10:38:06  _____________________________________________________________________  External Attachment:    Type:   Image     Comment:   External Document

## 2010-02-10 NOTE — Miscellaneous (Signed)
Summary: [facility]  Patient: Michelle [name] Note: All result statuses are Final unless otherwise noted.  Tests: (1) VIP (Medications)   LLIMPORTMEDS              "Result Below..."       RESULT: PROZAC CAPS 20 MG*TAKE ONE (1) CAPSULE EACH DAY*11/04/2005*Last  Refill: Unknown*[id number]*******   [id number]              [id number]**  Note: An exclamation mark (!) indicates a result that was not dispersed into the flowsheet. Document Creation Date: 11/10/2006 3:04 PM _______________________________________________________________________  (1) Order result status: Final Collection or observation date-time: 09/28/2006 Requested date-time: 09/28/2006 Receipt date-time:  Reported date-time: 09/28/2006 Referring Physician:   Ordering Physician:   Specimen Source:  Source: [place] Order Number:  Lab site:

## 2010-02-12 NOTE — Progress Notes (Signed)
Summary: Refill meds  Phone Note Call from Patient Call back at Home Phone [phone]   Reason for Call: Refill Medication Summary of Call: [name] Michelle [name] CALLED FOR A REFILL ON HER FUROSEMIDE Initial call taken by: Leodis Rains,  December 23, 2009 12:11 PM  Follow-up for Phone Call        Checked with pharmacy -- has been getting it refilled through another doctor.  Refilled yesterday per pharmacy. Follow-up by: Dutch Quint RN,  December 23, 2009 12:38 PM

## 2010-02-12 NOTE — Medication Information (Signed)
Summary: [facility] PT HISTORY REPORT  [facility] PT HISTORY REPORT   Imported By: Arta Bruce 01/13/2010 16:39:23  _____________________________________________________________________  External Attachment:    Type:   Image     Comment:   External Document

## 2010-02-12 NOTE — Progress Notes (Signed)
Summary: PAIN CLINIC REFERRAL   Phone Note Call from Patient   Summary of Call: MS Scow, CALLED TO SAY YOU NEED TO CALL [facility] TO GET THE REFERRAL TO SEND THEM  //[phone] FAX [phone]//CALL ON DAUGHTER PHONE [phone]//AMANDA//WHEN YOU DO THIS PLEASE. Initial call taken by: Arta Bruce,  December 24, 2009 10:58 AM  Follow-up for Phone Call        Dr Delrae Alfred can you please check these ?  I don't see any referral from pain clinic  Thank you  before I procede with the pt request  Follow-up by: Cheryll Dessert,  December 24, 2009 1:35 PM  Additional Follow-up for Phone Call Additional follow up Details #1::        She needs to call [facility] and get the papers sent here.    Additional Follow-up by: Julieanne Manson MD,  December 25, 2009 4:03 PM    Additional Follow-up for Phone Call Additional follow up Details #2::    Pt aware of that and she is going to call  [facility] .Marland KitchenCheryll Dessert  December 26, 2009 9:44 AM

## 2010-02-12 NOTE — Letter (Signed)
Summary: RECEIVED RECORDS FROM [facility]  RECEIV[facility] REC[facility]DS FROM [facility]   Imported By: Arta Bruce 01/09/2010 11:50:40  _____________________________________________________________________  External Attachment:    Type:   Image     Comment:   External Document

## 2010-02-12 NOTE — Letter (Signed)
Summary: [facility]  [facility]   Imported By: Arta Bruce 01/14/2010 11:16:13  _____________________________________________________________________  External Attachment:    Type:   Image     Comment:   External Document

## 2010-02-12 NOTE — Letter (Signed)
Summary: CALL A NURSE  CALL A NURSE   Imported By: Arta Bruce 02/05/2010 14:24:07  _____________________________________________________________________  External Attachment:    Type:   Image     Comment:   External Document

## 2010-02-12 NOTE — Assessment & Plan Note (Signed)
Summary: TALK ABOUT THE PAIN PT IS HAVING///CNS   Vital Signs:  Patient profile:   45 year old female Menstrual status:  irregular LMP:     12/08/2009 Weight:      303.13 pounds BMI:     44.28 Temp:     97.5 degrees F oral Pulse rate:   82 / minute Pulse rhythm:   regular Resp:     18 per minute BP sitting:   128 / 88  (left arm) Cuff size:   large  Vitals Entered By: Hale Drone CMA (January 08, 2010 3:27 PM) CC: Pain on the back. Would like some pain meds. Pt. states the pain is constant that she can't even walk w/o experiencing pain. Can not do her day to day activities. Pain will radiate down to her legs and will need assistance getting up from sitting down.   Is Patient Diabetic? Yes Pain Assessment Patient in pain? yes     Location: back Intensity: 9 Type: Aching/Throbbing CBG Result 125 CBG Device ID B non fasting  Does patient need assistance? Functional Status Self care Ambulation Normal LMP (date): 12/08/2009     Enter LMP: 12/08/2009 Last PAP Result NEGATIVE FOR INTRAEPITHELIAL LESIONS OR MALIGNANCY.   CC:  Pain on the back. Would like some pain meds. Pt. states the pain is constant that she can't even walk w/o experiencing pain. Can not do her day to day activities. Pain will radiate down to her legs and will need assistance getting up from sitting down.  Marland Kitchen  History of Present Illness: Pt. here with chronic pain issues.  Has been fired from 2 separate pain clinics locally in past year as she has broken her pain contracts.  Has been warned here as well--told her her prior to last pain clinic start that I would fill her pain medications only one more timet .  Since failed her second pain clinic, have stated several times that we would no longer prescribe pain medications for her secondary to inability to follow the pain contracts.  It appears her last [facility] with [facility] as she did not have the appropriate  medication/metobolites in urine if she was taking medication as prescribed.    She does not have transportation to go elsewhere for pain control.     Medicaid Transportation:  Fax:  [phone].  Carney Bern.  Needs a note stating she cannot walk to bus stop--to pick up at her home.  Current Medications (verified): 1)  Neurontin 300 Mg Caps (Gabapentin) .... 2 Caps in Am, 1 in Afternoon and 2 Caps By Mouth in Evening.  [facility].  Dr. Drucilla Schmidt 2)  Cymbalta 60 Mg Cpep (Duloxetine Hcl) .Marland Kitchen.. 1 By Mouth Once Daily  Dr. [facility].  Also See Zenia Resides 3)  Furosemide 20 Mg Tabs (Furosemide) .Marland Kitchen.. 1 Tab By Mouth in Morning 4)  Klor-Con 10 10 Meq Cr-Tabs (Potassium Chloride) .Marland Kitchen.. 1 Tab By Mouth Daily 5)  Ventolin Hfa 108 (90 Base) Mcg/act Aers (Albuterol Sulfate) .... 2 Puffs Every 4 Hours As Needed For Wheeze. 6)  Nicotine 21 Mg/24hr Pt24 (Nicotine) .... Apply Topically To Skin Daily For Smoking 7)  Methocarbamol 750 Mg Tabs (Methocarbamol) .Marland Kitchen.. 1 By Mouth 4 Times Daily As Needed For Back Pain and Right Leg Pain 8)  Trazodone Hcl 50 Mg Tabs (Trazodone Hcl) .... 1/2 -1 By Mouth At Bedtime As Needed-[facility]--Dr. Drucilla Schmidt. 9)  Famotidine 40 Mg Tabs (  Famotidine) .Marland Kitchen.. 1 Tab By Mouth At Bedtime Daily 10)  Fluticasone Propionate 50 Mcg/act Susp (Fluticasone Propionate) .... 2 Sprays Each Nostril Once Daily For 2 Weeks  Allergies (verified): 1)  ! Codeine 2)  ! Penicillin  Physical Exam  General:  Pt. tearful and angry.   Neurologic:  Gait normal walking down hallway as she leaves   Impression & Recommendations:  Problem # 1:  DIABETES MELLITUS, TYPE II, CONTROLLED (ICD-250.00) Remains controlled Orders: Capillary Blood Glucose/CBG (82948) Hgb A1C (16109UE)  Problem # 2:  DISC DISEASE, LUMBOSACRAL SPINE (ICD-722.52) Chronic pain:  mainly back and trochanteric bursitis at this time Again, reiterated would not prescribe and controlled  substances for pt. She left without check out papers--CNA notified her of good sugar control  Complete Medication List: 1)  Neurontin 300 Mg Caps (Gabapentin) .... 2 caps in am, 1 in afternoon and 2 caps by mouth in evening.  [facility].  dr. Gaston Islam brown 2)  Cymbalta 60 Mg Cpep (Duloxetine hcl) .Marland Kitchen.. 1 by mouth once daily  dr. Gaston Islam brown-[facility].  also see ann miller 3)  Furosemide 20 Mg Tabs (Furosemide) .Marland Kitchen.. 1 tab by mouth in morning 4)  Klor-con 10 10 Meq Cr-tabs (Potassium chloride) .Marland Kitchen.. 1 tab by mouth daily 5)  Ventolin Hfa 108 (90 Base) Mcg/act Aers (Albuterol sulfate) .... 2 puffs every 4 hours as needed for wheeze. 6)  Nicotine 21 Mg/24hr Pt24 (Nicotine) .... Apply topically to skin daily for smoking 7)  Methocarbamol 750 Mg Tabs (Methocarbamol) .Marland Kitchen.. 1 by mouth 4 times daily as needed for back pain and right leg pain 8)  Trazodone Hcl 50 Mg Tabs (Trazodone hcl) .... 1/2 -1 by mouth at bedtime as needed-[facility]--dr. loyce brown. 9)  Famotidine 40 Mg Tabs (Famotidine) .Marland Kitchen.. 1 tab by mouth at bedtime daily 10)  Fluticasone Propionate 50 Mcg/act Susp (Fluticasone propionate) .... 2 sprays each nostril once daily for 2 weeks   Orders Added: 1)  Capillary Blood Glucose/CBG [82948] 2)  Hgb A1C [83036QW] 3)  Est. Patient Level III [45409]   Not Administered:    Influenza Vaccine not given due to: declined    Laboratory Results   Blood Tests   Date/Time Received: January 08, 2010 3:41 PM   HGBA1C: 5.7%   (Normal Range: Non-Diabetic - 3-6%   Control Diabetic - 6-8%) CBG Random:: 125mg /dL

## 2010-02-12 NOTE — Letter (Signed)
Summary: [facility]  [facility]   Imported By: Arta Bruce 01/15/2010 15:14:09  _____________________________________________________________________  External Attachment:    Type:   Image     Comment:   External Document

## 2010-02-12 NOTE — Letter (Signed)
Summary: [facility]  [facility]   Imported By: Arta Bruce 01/29/2010 08:46:05  _____________________________________________________________________  External Attachment:    Type:   Image     Comment:   External Document

## 2010-03-25 LAB — CBC
HCT: 46.5 % — ABNORMAL HIGH (ref 36.0–46.0)
Hemoglobin: 15.6 g/dL — ABNORMAL HIGH (ref 12.0–15.0)
MCH: 30.4 pg (ref 26.0–34.0)
MCHC: 33.5 g/dL (ref 30.0–36.0)
MCV: 90.5 fL (ref 78.0–100.0)
Platelets: 106 10*3/uL — ABNORMAL LOW (ref 150–400)
RBC: 5.14 MIL/uL — ABNORMAL HIGH (ref 3.87–5.11)
RDW: 13.7 % (ref 11.5–15.5)
WBC: 5.1 10*3/uL (ref 4.0–10.5)

## 2010-03-25 LAB — APTT: aPTT: 28 seconds (ref 24–37)

## 2010-03-25 LAB — DIFFERENTIAL
Basophils Absolute: 0 10*3/uL (ref 0.0–0.1)
Basophils Relative: 0 % (ref 0–1)
Eosinophils Absolute: 0.2 10*3/uL (ref 0.0–0.7)
Eosinophils Relative: 3 % (ref 0–5)
Lymphocytes Relative: 45 % (ref 12–46)
Lymphs Abs: 2.3 10*3/uL (ref 0.7–4.0)
Monocytes Absolute: 0.4 10*3/uL (ref 0.1–1.0)
Monocytes Relative: 7 % (ref 3–12)
Neutro Abs: 2.3 10*3/uL (ref 1.7–7.7)
Neutrophils Relative %: 45 % (ref 43–77)

## 2010-03-25 LAB — URINALYSIS, ROUTINE W REFLEX MICROSCOPIC
Bilirubin Urine: NEGATIVE
Glucose, UA: NEGATIVE mg/dL
Hgb urine dipstick: NEGATIVE
Ketones, ur: NEGATIVE mg/dL
Nitrite: NEGATIVE
Protein, ur: NEGATIVE mg/dL
Specific Gravity, Urine: 1.007 (ref 1.005–1.030)
Urobilinogen, UA: 0.2 mg/dL (ref 0.0–1.0)
pH: 5.5 (ref 5.0–8.0)

## 2010-03-25 LAB — COMPREHENSIVE METABOLIC PANEL
ALT: 18 U/L (ref 0–35)
AST: 20 U/L (ref 0–37)
Albumin: 4 g/dL (ref 3.5–5.2)
Alkaline Phosphatase: 101 U/L (ref 39–117)
BUN: 12 mg/dL (ref 6–23)
CO2: 27 mEq/L (ref 19–32)
Calcium: 9.2 mg/dL (ref 8.4–10.5)
Chloride: 99 mEq/L (ref 96–112)
Creatinine, Ser: 1.24 mg/dL — ABNORMAL HIGH (ref 0.4–1.2)
GFR calc Af Amer: 57 mL/min — ABNORMAL LOW (ref >60)
GFR calc non Af Amer: 47 mL/min — ABNORMAL LOW (ref >60)
Glucose, Bld: 97 mg/dL (ref 70–99)
Potassium: 3.7 mEq/L (ref 3.5–5.1)
Sodium: 133 mEq/L — ABNORMAL LOW (ref 135–145)
Total Bilirubin: 0.6 mg/dL (ref 0.3–1.2)
Total Protein: 7.7 g/dL (ref 6.0–8.3)

## 2010-03-25 LAB — GLUCOSE, CAPILLARY: Glucose-Capillary: 111 mg/dL — ABNORMAL HIGH (ref 70–99)

## 2010-03-25 LAB — SURGICAL PCR SCREEN
MRSA, PCR: NEGATIVE
Staphylococcus aureus: NEGATIVE

## 2010-03-25 LAB — PROTIME-INR
INR: 0.91 (ref 0.00–1.49)
Prothrombin Time: 12.5 seconds (ref 11.6–15.2)

## 2010-03-27 LAB — CBC
HCT: 43.3 % (ref 36.0–46.0)
Hemoglobin: 14.9 g/dL (ref 12.0–15.0)
MCH: 31.2 pg (ref 26.0–34.0)
MCHC: 34.4 g/dL (ref 30.0–36.0)
MCV: 90.6 fL (ref 78.0–100.0)
Platelets: 132 10*3/uL — ABNORMAL LOW (ref 150–400)
RBC: 4.78 MIL/uL (ref 3.87–5.11)
RDW: 14.5 % (ref 11.5–15.5)
WBC: 5.4 10*3/uL (ref 4.0–10.5)

## 2010-03-27 LAB — COMPREHENSIVE METABOLIC PANEL
ALT: 13 U/L (ref 0–35)
AST: 15 U/L (ref 0–37)
Albumin: 3.7 g/dL (ref 3.5–5.2)
Alkaline Phosphatase: 93 U/L (ref 39–117)
BUN: 10 mg/dL (ref 6–23)
CO2: 27 mEq/L (ref 19–32)
Calcium: 9.3 mg/dL (ref 8.4–10.5)
Chloride: 102 mEq/L (ref 96–112)
Creatinine, Ser: 1.14 mg/dL (ref 0.4–1.2)
GFR calc Af Amer: >60 mL/min (ref >60)
GFR calc non Af Amer: 52 mL/min — ABNORMAL LOW (ref >60)
Glucose, Bld: 87 mg/dL (ref 70–99)
Potassium: 3.5 mEq/L (ref 3.5–5.1)
Sodium: 137 mEq/L (ref 135–145)
Total Bilirubin: 0.4 mg/dL (ref 0.3–1.2)
Total Protein: 7.1 g/dL (ref 6.0–8.3)

## 2010-03-27 LAB — URINALYSIS, ROUTINE W REFLEX MICROSCOPIC
Bilirubin Urine: NEGATIVE
Glucose, UA: NEGATIVE mg/dL
Hgb urine dipstick: NEGATIVE
Ketones, ur: NEGATIVE mg/dL
Nitrite: NEGATIVE
Protein, ur: NEGATIVE mg/dL
Specific Gravity, Urine: 1.005 (ref 1.005–1.030)
Urobilinogen, UA: 0.2 mg/dL (ref 0.0–1.0)
pH: 5.5 (ref 5.0–8.0)

## 2010-03-27 LAB — PROTIME-INR
INR: 0.98 (ref 0.00–1.49)
Prothrombin Time: 13.2 seconds (ref 11.6–15.2)

## 2010-03-27 LAB — GLUCOSE, CAPILLARY
Glucose-Capillary: 110 mg/dL — ABNORMAL HIGH (ref 70–99)
Glucose-Capillary: 122 mg/dL — ABNORMAL HIGH (ref 70–99)

## 2010-03-27 LAB — SURGICAL PCR SCREEN
MRSA, PCR: NEGATIVE
Staphylococcus aureus: NEGATIVE

## 2010-03-27 LAB — APTT: aPTT: 29 seconds (ref 24–37)

## 2010-03-30 LAB — POCT I-STAT, CHEM 8
BUN: 20 mg/dL (ref 6–23)
Calcium, Ion: 1.14 mmol/L (ref 1.12–1.32)
Chloride: 105 mEq/L (ref 96–112)
Creatinine, Ser: 1.3 mg/dL — ABNORMAL HIGH (ref 0.4–1.2)
Glucose, Bld: 94 mg/dL (ref 70–99)
HCT: 46 % (ref 36.0–46.0)
Hemoglobin: 15.6 g/dL — ABNORMAL HIGH (ref 12.0–15.0)
Potassium: 4.1 mEq/L (ref 3.5–5.1)
Sodium: 141 mEq/L (ref 135–145)
TCO2: 29 mmol/L (ref 0–100)

## 2010-04-04 ENCOUNTER — Emergency Department (HOSPITAL_COMMUNITY): Payer: Medicaid Other

## 2010-04-04 ENCOUNTER — Emergency Department (HOSPITAL_COMMUNITY)
Admission: EM | Admit: 2010-04-04 | Discharge: 2010-04-04 | Disposition: A | Discharge status: Home / Self Care | Payer: Medicaid Other | Attending: Emergency Medicine | Admitting: Emergency Medicine

## 2010-04-04 DIAGNOSIS — M545 Low back pain, unspecified: Secondary | ICD-10-CM | POA: Insufficient documentation

## 2010-04-04 DIAGNOSIS — J4489 Other specified chronic obstructive pulmonary disease: Secondary | ICD-10-CM | POA: Insufficient documentation

## 2010-04-04 DIAGNOSIS — G8929 Other chronic pain: Secondary | ICD-10-CM | POA: Insufficient documentation

## 2010-04-04 DIAGNOSIS — J449 Chronic obstructive pulmonary disease, unspecified: Secondary | ICD-10-CM | POA: Insufficient documentation

## 2010-04-04 DIAGNOSIS — M5430 Sciatica, unspecified side: Secondary | ICD-10-CM | POA: Insufficient documentation

## 2010-04-04 DIAGNOSIS — F341 Dysthymic disorder: Secondary | ICD-10-CM | POA: Insufficient documentation

## 2010-04-05 ENCOUNTER — Encounter (INDEPENDENT_AMBULATORY_CARE_PROVIDER_SITE_OTHER): Payer: Self-pay | Admitting: Internal Medicine

## 2010-04-14 NOTE — Letter (Signed)
Summary: CALL A NURSE  CALL A NURSE   Imported By: Arta Bruce 04/09/2010 15:28:21  _____________________________________________________________________  External Attachment:    Type:   Image     Comment:   External Document

## 2010-04-15 LAB — URINE CULTURE: Colony Count: 10000

## 2010-04-15 LAB — URINALYSIS, ROUTINE W REFLEX MICROSCOPIC
Bilirubin Urine: NEGATIVE
Glucose, UA: NEGATIVE mg/dL
Hgb urine dipstick: NEGATIVE
Ketones, ur: NEGATIVE mg/dL
Nitrite: NEGATIVE
Protein, ur: NEGATIVE mg/dL
Specific Gravity, Urine: 1.008 (ref 1.005–1.030)
Urobilinogen, UA: 0.2 mg/dL (ref 0.0–1.0)
pH: 6 (ref 5.0–8.0)

## 2010-04-15 LAB — URINE MICROSCOPIC-ADD ON

## 2010-04-15 LAB — POCT PREGNANCY, URINE: Preg Test, Ur: NEGATIVE

## 2010-04-20 ENCOUNTER — Other Ambulatory Visit: Payer: Self-pay | Admitting: Neurosurgery

## 2010-04-20 DIAGNOSIS — M549 Dorsalgia, unspecified: Secondary | ICD-10-CM

## 2010-04-26 ENCOUNTER — Emergency Department (HOSPITAL_COMMUNITY)
Admission: EM | Admit: 2010-04-26 | Discharge: 2010-04-26 | Disposition: A | Discharge status: Home / Self Care | Payer: Medicaid Other | Attending: Emergency Medicine | Admitting: Emergency Medicine

## 2010-04-26 ENCOUNTER — Emergency Department (HOSPITAL_COMMUNITY): Payer: Medicaid Other

## 2010-04-26 DIAGNOSIS — M169 Osteoarthritis of hip, unspecified: Secondary | ICD-10-CM | POA: Insufficient documentation

## 2010-04-26 DIAGNOSIS — IMO0001 Reserved for inherently not codable concepts without codable children: Secondary | ICD-10-CM | POA: Insufficient documentation

## 2010-04-26 DIAGNOSIS — F411 Generalized anxiety disorder: Secondary | ICD-10-CM | POA: Insufficient documentation

## 2010-04-26 DIAGNOSIS — R300 Dysuria: Secondary | ICD-10-CM | POA: Insufficient documentation

## 2010-04-26 DIAGNOSIS — M1610 Unilateral primary osteoarthritis, unspecified hip: Secondary | ICD-10-CM | POA: Insufficient documentation

## 2010-04-26 LAB — DIFFERENTIAL
Basophils Absolute: 0 10*3/uL (ref 0.0–0.1)
Basophils Relative: 0 % (ref 0–1)
Eosinophils Absolute: 0.3 10*3/uL (ref 0.0–0.7)
Eosinophils Relative: 5 % (ref 0–5)
Lymphocytes Relative: 39 % (ref 12–46)
Lymphs Abs: 2.2 10*3/uL (ref 0.7–4.0)
Monocytes Absolute: 0.4 10*3/uL (ref 0.1–1.0)
Monocytes Relative: 7 % (ref 3–12)
Neutro Abs: 2.8 10*3/uL (ref 1.7–7.7)
Neutrophils Relative %: 49 % (ref 43–77)

## 2010-04-26 LAB — URINALYSIS, ROUTINE W REFLEX MICROSCOPIC
Bilirubin Urine: NEGATIVE
Glucose, UA: NEGATIVE mg/dL
Hgb urine dipstick: NEGATIVE
Ketones, ur: NEGATIVE mg/dL
Nitrite: NEGATIVE
Protein, ur: NEGATIVE mg/dL
Specific Gravity, Urine: 1.011 (ref 1.005–1.030)
Urobilinogen, UA: 0.2 mg/dL (ref 0.0–1.0)
pH: 5.5 (ref 5.0–8.0)

## 2010-04-26 LAB — URINE MICROSCOPIC-ADD ON

## 2010-04-26 LAB — CBC
HCT: 40.4 % (ref 36.0–46.0)
Hemoglobin: 13.2 g/dL (ref 12.0–15.0)
MCH: 30.1 pg (ref 26.0–34.0)
MCHC: 32.7 g/dL (ref 30.0–36.0)
MCV: 92 fL (ref 78.0–100.0)
Platelets: 150 10*3/uL (ref 150–400)
RBC: 4.39 MIL/uL (ref 3.87–5.11)
RDW: 13.9 % (ref 11.5–15.5)
WBC: 5.7 10*3/uL (ref 4.0–10.5)

## 2010-04-26 LAB — POCT I-STAT, CHEM 8
BUN: 12 mg/dL (ref 6–23)
Calcium, Ion: 1.1 mmol/L — ABNORMAL LOW (ref 1.12–1.32)
Chloride: 98 mEq/L (ref 96–112)
Creatinine, Ser: 1.3 mg/dL — ABNORMAL HIGH (ref 0.4–1.2)
Glucose, Bld: 110 mg/dL — ABNORMAL HIGH (ref 70–99)
HCT: 39 % (ref 36.0–46.0)
Hemoglobin: 13.3 g/dL (ref 12.0–15.0)
Potassium: 3.9 mEq/L (ref 3.5–5.1)
Sodium: 140 mEq/L (ref 135–145)
TCO2: 33 mmol/L (ref 0–100)

## 2010-04-27 ENCOUNTER — Other Ambulatory Visit: Payer: Medicaid Other

## 2010-04-28 ENCOUNTER — Other Ambulatory Visit: Payer: Medicaid Other

## 2010-05-05 ENCOUNTER — Inpatient Hospital Stay: Admission: RE | Admit: 2010-05-05 | Payer: Medicaid Other | Source: Ambulatory Visit

## 2010-05-11 ENCOUNTER — Ambulatory Visit
Admission: RE | Admit: 2010-05-11 | Discharge: 2010-05-11 | Disposition: A | Discharge status: Home / Self Care | Payer: Medicaid Other | Source: Ambulatory Visit | Attending: Neurosurgery | Admitting: Neurosurgery

## 2010-05-11 DIAGNOSIS — M549 Dorsalgia, unspecified: Secondary | ICD-10-CM

## 2010-05-11 MED ORDER — GADOBENATE DIMEGLUMINE 529 MG/ML IV SOLN
10.0000 mL | Freq: Once | INTRAVENOUS | Status: AC | PRN
  Administered 2010-05-11: 10 mL via INTRAVENOUS

## 2010-05-16 ENCOUNTER — Emergency Department (HOSPITAL_COMMUNITY)
Admission: EM | Admit: 2010-05-16 | Discharge: 2010-05-16 | Disposition: A | Discharge status: Home / Self Care | Payer: Medicaid Other | Attending: Emergency Medicine | Admitting: Emergency Medicine

## 2010-05-16 DIAGNOSIS — Z79899 Other long term (current) drug therapy: Secondary | ICD-10-CM | POA: Insufficient documentation

## 2010-05-16 DIAGNOSIS — R300 Dysuria: Secondary | ICD-10-CM | POA: Insufficient documentation

## 2010-05-16 DIAGNOSIS — G8929 Other chronic pain: Secondary | ICD-10-CM | POA: Insufficient documentation

## 2010-05-16 DIAGNOSIS — J4489 Other specified chronic obstructive pulmonary disease: Secondary | ICD-10-CM | POA: Insufficient documentation

## 2010-05-16 DIAGNOSIS — N12 Tubulo-interstitial nephritis, not specified as acute or chronic: Secondary | ICD-10-CM | POA: Insufficient documentation

## 2010-05-16 DIAGNOSIS — J449 Chronic obstructive pulmonary disease, unspecified: Secondary | ICD-10-CM | POA: Insufficient documentation

## 2010-05-16 LAB — URINE MICROSCOPIC-ADD ON

## 2010-05-16 LAB — URINALYSIS, ROUTINE W REFLEX MICROSCOPIC
Bilirubin Urine: NEGATIVE
Glucose, UA: NEGATIVE mg/dL
Ketones, ur: NEGATIVE mg/dL
Nitrite: POSITIVE — AB
Protein, ur: NEGATIVE mg/dL
Specific Gravity, Urine: 1.014 (ref 1.005–1.030)
Urobilinogen, UA: 1 mg/dL (ref 0.0–1.0)
pH: 5 (ref 5.0–8.0)

## 2010-05-16 LAB — BASIC METABOLIC PANEL
BUN: 10 mg/dL (ref 6–23)
CO2: 28 mEq/L (ref 19–32)
Calcium: 9.5 mg/dL (ref 8.4–10.5)
Chloride: 101 mEq/L (ref 96–112)
Creatinine, Ser: 1.09 mg/dL (ref 0.4–1.2)
GFR calc Af Amer: >60 mL/min (ref >60)
GFR calc non Af Amer: 54 mL/min — ABNORMAL LOW (ref >60)
Glucose, Bld: 100 mg/dL — ABNORMAL HIGH (ref 70–99)
Potassium: 4 mEq/L (ref 3.5–5.1)
Sodium: 137 mEq/L (ref 135–145)

## 2010-05-16 LAB — DIFFERENTIAL
Basophils Absolute: 0 10*3/uL (ref 0.0–0.1)
Basophils Relative: 0 % (ref 0–1)
Eosinophils Absolute: 0.3 10*3/uL (ref 0.0–0.7)
Eosinophils Relative: 4 % (ref 0–5)
Lymphocytes Relative: 35 % (ref 12–46)
Lymphs Abs: 2.4 10*3/uL (ref 0.7–4.0)
Monocytes Absolute: 0.5 10*3/uL (ref 0.1–1.0)
Monocytes Relative: 8 % (ref 3–12)
Neutro Abs: 3.5 10*3/uL (ref 1.7–7.7)
Neutrophils Relative %: 52 % (ref 43–77)

## 2010-05-16 LAB — CBC
HCT: 40.5 % (ref 36.0–46.0)
Hemoglobin: 13.6 g/dL (ref 12.0–15.0)
MCH: 30.2 pg (ref 26.0–34.0)
MCHC: 33.6 g/dL (ref 30.0–36.0)
MCV: 89.8 fL (ref 78.0–100.0)
Platelets: 128 10*3/uL — ABNORMAL LOW (ref 150–400)
RBC: 4.51 MIL/uL (ref 3.87–5.11)
RDW: 13.8 % (ref 11.5–15.5)
WBC: 6.7 10*3/uL (ref 4.0–10.5)

## 2010-05-18 LAB — URINE CULTURE
Colony Count: >=100000
Culture  Setup Time: 201205051956

## 2010-05-26 NOTE — Op Note (Signed)
[name], [name]             ACCOUNT NO.:  [id number]   MEDICAL RECORD NO.:  [id number]          PATIENT TYPE:  INP   LOCATION:  2550                         FACILITY:  [facility]   PHYSICIAN:  Alvy Beal, MD    DATE OF BIRTH:  [birth year]   DATE OF PROCEDURE:  12/05/2006  DATE OF DISCHARGE:                               OPERATIVE REPORT   PREOPERATIVE DIAGNOSIS:  Isthmic spondylolisthesis, L5-S1, with  degenerative disk disease, L5-S1, with diskogenic back pain.   POSTOPERATIVE DIAGNOSIS:  Isthmic spondylolisthesis, L5-S1, with  degenerative disk disease, L5-S1, with diskogenic back pain.   PROCEDURE:  Anterior lumbar interbody fusion, L5-S1.   COMPLICATIONS:  None.   CONDITION:  Stable.   APPROACH SURGEON:  Balinda Quails, M.D.   FIRST ASSISTANT TO DR. Shon Baton:  Jene Every, M.D.   SECOND ASSISTANT:  Sue Lush __________  , P.A.   HISTORY:  This is a very pleasant 45 year old woman with significant  longstanding back and intermittent bilateral leg pain.  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  The  patient's primary complaint was diskogenic in nature.  After discussing  all risks, benefits, and alternatives to surgery, the patient consented  to the aforementioned procedure.  In addition, because of the  possibility of having less than ideal fixation anteriorly, I also  consented her for the possibility of a posterior pedicle screw fixation  as a supplement to the anterior.   Intraoperatively, I was noted to have excellent fixation of the STALIF  device that I used with two L5 screws and a single S1 screw with  satisfactory results, and so I elected, because we had adequate fixation  and a very adequate decompression/diskectomy, I elected not to proceed  with the posterior fixation.   The only other preoperative finding was that the patient was noted to  have  a significant groin fungal infection in the pannus as well as  underneath the breast tissue.  As a result, preoperatively she was put  on Diflucan, and she will remain on that for 1 week.   OPERATIVE APPROACH:  The patient's abdomen was prepped and draped in a  standard fashion after successful induction of general anesthesia and  endotracheal intubation.  TEDs and SCDs and a Foley were also applied.  Please refer to Dr. Madilyn Fireman' notes for the details of the approach.   Once adequate self-retaining retractor blades were positioned, we  confirmed the L5-S1 disk space with fluoroscopy.  Once confirmed, I used  a 10 scalpel blade to incise the annulus, and then using a combination  of pituitary rongeurs and Kerrison rongeurs, I resected the entire disk.  Once I distracted the space, I worked posteriorly with a fine curette,  dissecting through the posterior annulus back to behind the vertebral  body.  I released the annulus from the posterior aspect of the S1  vertebral body as well as the L5-S1 vertebral body.  I then used the 2-  mm Kerrison to begin removing the posterior annulus.  Great care was  taken on the lateral sides in order to provide a better  indirect  foraminotomy.   Once I had an excellent diskectomy/decompression, I rasped the endplates  to get bleeding bone.  At this point, I had a wide surface for a  fixation, and I was able to get the large footprint 39-mm wide, 13-mm  high graft in position.  We had excellent position and fixation.  I then  secured it to the bodies of L5 with two 30-mm inner device screws, one  going up into L5 and the other going into S1.  I placed a third 25-mm  screw into S1 to augment the fixation.   With three satisfactory screws and a large vertebral graft, I packed  with Actifuse.  I was very pleased with our fixation and the stability  of that fixation.  As such, I removed all the retractor blades,  irrigated the wound copiously with normal  saline.  Dr. Madilyn Fireman then  explored the wound.  We then closed the rectus fascia with interrupted 0  Vicryl sutures.  We then ran Scarpa's fascia with interrupted a running  0 Vicryl stitch, and then 2-0 interrupted in the superficial  subcutaneous tissue and then a 3-0 Monocryl for the skin.  Steri-Strips  and dry dressing were applied.  At the end of the case, all needle and  sponge counts were correct.   The patient was extubated and transferred to the PACU without incident.      Alvy Beal, MD  Electronically Signed     DDB/MEDQ  D:  12/05/2006  T:  12/05/2006  Job:  [id number]   cc:   Balinda Quails, M.D.

## 2010-05-26 NOTE — Op Note (Signed)
NAMEARLETA, OSTRUM NO.:  [id number]   MEDICAL RECORD NO.:  [id number]          PATIENT TYPE:  INP   LOCATION:  5025                         FACILITY:  [facility]   PHYSICIAN:  Lubertha Basque. Dalldorf, M.D.DATE OF BIRTH:  [birth year]   DATE OF PROCEDURE:  11/28/2007  DATE OF DISCHARGE:  11/22/2007                               OPERATIVE REPORT   PREOPERATIVE DIAGNOSIS:  Left knee degenerative arthritis.   POSTOPERATIVE DIAGNOSIS:  Left knee degenerative arthritis.   PROCEDURE:  Left total knee replacement.   ANESTHESIA:  General and block.   ATTENDING SURGEON:  Lubertha Basque. Jerl Santos, MD   ASSISTANT:  Lindwood Qua, PA   INDICATIONS FOR PROCEDURE:  The patient is a 45 year old woman with a  long history of painful left knee.  She has significant valgus  deformity, has trouble walking and bearing weight and resting.  By x-  ray, she has advanced degenerative change.  She has had multiple  injections and tried various oral anti-inflammatories and braces.  She  has pain, which is limiting to her and she is offered a knee  replacement.  Informed operative consent was obtained after discussion  of possible complications including reaction to anesthesia, infection,  DVT, PE, and death.  The importance of the postoperative rehabilitation  protocol to optimize result was also stressed with the patient.   SUMMARY, FINDINGS, AND PROCEDURE:  Under general anesthesia and a block,  a left knee replacement was performed.  She had advanced degenerative  change both medial and patellofemoral and a significant valgus  deformity.  We replaced her knee with a cemented DePuy LCS system.  We  were careful to correct her valgus deformity.  She had excellent bone  quality and we used a large femur, 4 MBT revision tray with a short  stem, 38-mm all-polyethylene patella, and 10-mm deep dish spacer.  Bryna Colander assisted throughout and was invaluable to the completion of the  case in that  he helped position and retract while I performed the  procedure.  He also closed simultaneously to help minimize OR time.  We  did include Zinacef antibiotic in cement.  She flexed up to 125 against  gravity at the end of the case.   DESCRIPTION OF PROCEDURE:  The patient was taken to the operating suite  where general anesthetic was applied without difficulty.  She was also  given a block in the preanesthesia area.  She was positioned supine,  prepped and draped in normal sterile fashion.  After the administration  of IV Kefzol, the left leg was elevated, exsanguinated, tourniquet  inflated about the thigh.  A longitudinal anterior incision was made  with dissection down the extensor mechanism.  All appropriate anti-  infected measures were used including the preoperative IV antibiotic,  Betadine-impregnated drape, and closed hooded exhaust systems for each  member of the surgical team.  A medial parapatellar incision was made  and the kneecap was flipped and the knee flexed.  Some residual meniscal  tissues were removed along with some large osteophytes and ACL and PCL.  Findings were as noted above.  An extramedullary guide was placed in the  tibia to make a cut, which was roughly flat.  We then placed an  intramedullary guide in the femur and utilized this to make anterior and  posterior cuts creating a flexion gap of 10 mm.  A second intramedullary  guide was placed in the femur to make a distal cut with a 10-mm gap  thereby balancing the knee.  Some soft tissue release was required off  the femur to balance the knee and address her valgus deformity.  The  femur sized to a large and the tibia to a 4 with appropriate guides  placed and utilized.  Due to her stature, we elected to place the short  stem and the T-revision tray and reamed appropriately.  The patella was  cut down thickness by 10 to a 15 and sized to 38 with appropriate guide  placed and utilized.  A trial reduction was  done with these components.  She easily came to slight hyperextension and flexed well.  Her valgus  deformity seemed to be eliminated.  Patella tracked well.  The trial  components were removed followed by pulsatile lavage irrigation of all 3  cut bony surfaces.  Cement was mixed including Zinacef and was  pressurized onto the bone.  The aforementioned components were then  placed.  Pressure was held in the components until the cement had  hardened and excess cement was trimmed.  She again ranged well.  The  tourniquet was deflated and a small amount of bleeding was easily  controlled with some pressure and cautery.  The knee was irrigated  followed by placement of a drain exiting superolaterally.  The extensor  mechanism was reapproximated with #1 Vicryl in interrupted fashion  followed by subcutaneous reapproximation with 0 and 2-0 undyed Vicryl  and skin closure with staples.  Adaptic was applied followed by dry  gauze and a loose Ace wrap.  Estimated blood loss was minimal.  Intraoperative fluids obtained from anesthesia records.  Tourniquet time  can also be obtained from anesthesia records, but was over 1 hour  reflective of difficulty during the case.   DISPOSITION:  The patient was extubated in the operating room and taken  to recovery room in stable addition.  She was to be admitted to the  Orthopedic Surgery Service for appropriate postop care to include  perioperative antibiotics and Coumadin plus Lovenox for DVT prophylaxis.      Lubertha Basque Jerl Santos, M.D.  Electronically Signed     PGD/MEDQ  D:  11/28/2007  T:  11/28/2007  Job:  [id number]

## 2010-05-26 NOTE — Op Note (Signed)
[name], [name]:  [id number]   MEDICAL RECORD NO.:  [id number]          PATIENT TYPE:  INP   LOCATION:  5024                         FACILITY:  [facility]   PHYSICIAN:  Balinda Quails, M.D.    DATE OF BIRTH:  August 15, 1965   DATE OF PROCEDURE:  12/05/2006  DATE OF DISCHARGE:                               OPERATIVE REPORT   SURGEON:  P Bud Face, MD.   Tina GriffithsVenita Lick, MD.   ANESTHETIC:  General endotracheal.   PREOPERATIVE DIAGNOSIS:  L5-S1 spondylolisthesis.   POSTOPERATIVE DIAGNOSIS:  L5-S1 spondylolisthesis.   PROCEDURE:  L5-S1 anterior lumbar interbody fusion (ALIF).   CLINICAL NOTE:  Michelle [name] is a 45 year old female with chronic  back pain seen by Dr. Shon Baton in consultation.  She has degenerative  disease at L5-S1 spondylolisthesis.  She is brought to the operating at  this time for planned L5-S1 ALIF.  Risks of the operative procedure were  reviewed with the patient with a major morbidity and mortality 1-2%.   PROCEDURE NOTE:  The patient brought to be operating room in stable  hemodynamic condition.  Placed under general endotracheal anesthesia.  In supine position the abdomen prepped and draped in sterile fashion.   The transverse right lower quadrant skin incision made over the  projection of L5-S1.  Dissection carried down through subcutaneous  tissue electrocautery.  The right anterior rectus sheath was cleared.  Incised from midline to the lateral margin of right rectus muscle.  The  patient had a previous standstill incision and the medial portion of the  muscle was quite fibrous and fixed.  The anterior rectus sheath was  raised superiorly and inferiorly.  The muscle mobilized from laterally.  The muscle then retracted to the left.  The retroperitoneal plane  entered in the left lower quadrant.  The abdominal contents were rolled  anteriorly and the psoas muscle and genitofemoral nerve identified and  the dissection  brought over the top of these.  The genitofemoral nerve  left on the muscle.  The dissection was carried medially.  The left  external and common iliac artery were cleared bluntly and the ureter  rolled off of the muscle.  The ureter retracted with the abdominal  contents.  The L5-S1 disk space was palpated.  This was an extremely  deep dissection due to the patient's obesity.   The L5-S1 disk was then cleared with blunt dissection initially and then  electrocautery.  The middle sacral vessels were controlled with bipolar  cautery and divided.  The L5-S1 disk was cleared from right to left.  The disk cleared over the lateral margin of the L5-S1 vertebral bodies  bilaterally.   A Thompson Brau retractor was then used to expose the L5-S1 disk.  Reverse lip blades were placed laterally on the bodies of L5 and S1.  Malleable retractors used to retract superiorly and inferiorly.  The L5-  S1 ALIF then completed by Dr. Shon Baton.   At completion of the ALIF, retractors were removed.  There were no  apparent complications noted.  No significant bleeding.  Pulse oximetry  remained  normal in the left foot.   The sponge and instrument counts were correct.   The anterior rectus sheath was then closed with running 0 Vicryl suture.  The subcutaneous tissue closed in two layers with deep 2-0 Vicryl suture  and superficial 3-0 Vicryl suture in running fashion.  Skin closed with  4-0 Monocryl, Dermabond Steri-Strips.   The patient tolerated procedure well.  No apparent complications.  Transferred to recovery room in stable condition.      Balinda Quails, M.D.  Electronically Signed     PGH/MEDQ  D:  12/05/2006  T:  12/06/2006  Job:  [id number]

## 2010-05-26 NOTE — Discharge Summary (Signed)
Michelle [name], Michelle [name]:  [id number]   MEDICAL RECORD NO.:  [id number]          PATIENT TYPE:  INP   LOCATION:  5024                         FACILITY:  [facility]   PHYSICIAN:  Alvy Beal, MD    DATE OF BIRTH:  Dec 23, 1965   DATE OF ADMISSION:  12/05/2006  DATE OF DISCHARGE:  12/10/2006                               DISCHARGE SUMMARY   REASON FOR ADMISSION:  Low back pain, with instability L5-S1 (L5-S1  degenerative spondylolisthesis).   DISCHARGE DIAGNOSIS:  Low back pain, with instability L5-S1 (L5-S1  degenerative spondylolisthesis).   HISTORY:  This is a very pleasant 45 year old woman with longstanding  chronic low back and bilateral leg pain, right worse than the left.  Clinical and radiographic analysis confirmed the diagnosis of a  degenerative lumbar spondylolisthesis at L5-S1.  As a result of failure  of conservative management, on the date of admission she was taken to  the operating room for an anterior lumbar interbody fusion.   The patient tolerated this procedure well.  There were no significant  adverse intraoperative findings.   Postoperatively, her primary problem was pain management.  She was noted  to have a fungal-type infection in the pannus of her lower abdomen which  was treated with antifungal medications as well as nystatin powder.  This eventually cleared, and she was discharged with appropriate  followup with her primary care physician concerning management of this.  From her spine standpoint, she was ambulating, neurologically intact.  CT scan postoperatively demonstrates satisfactory overall position and  alignment.  She had a postoperative Doppler which was also unremarkable.   Once we were able to get her pain under control with oral medications,  she was ultimately discharged to home.  Appropriate followup was made.  She was given the preprinted discharge instructions and medications.      Alvy Beal, MD  Electronically Signed     DDB/MEDQ  D:  01/19/2007  T:  01/20/2007  Job:  [id number]

## 2010-05-29 NOTE — Discharge Summary (Signed)
[name], BAZAR NO.:  [id number]   MEDICAL RECORD NO.:  [id number]          PATIENT TYPE:  INP   LOCATION:  5025                         FACILITY:  [facility]   PHYSICIAN:  Michelle Basque. Dalldorf, M.D.DATE OF BIRTH:  June 26, 1965   DATE OF ADMISSION:  11/28/2007  DATE OF DISCHARGE:  12/01/2007                               DISCHARGE SUMMARY   ADMITTING DIAGNOSES:  1. Left knee end-stage degenerative joint disease.  2. Depression.  3. Obesity.  4. Tobacco abuse.  5. History of asthma.   DISCHARGE DIAGNOSES:  1. Left knee end-stage degenerative joint disease.  2. Depression.  3. Obesity.  4. Tobacco abuse.  5. History of asthma.   OPERATION:  Left total knee replacement.   BRIEF HISTORY:  Michelle [name] is a 45 year old white female patient well  known to our practice who has had increasing left knee pain.  On her x-  ray, she has end-stage DJD particularly of the medial and patellofemoral  compartments.  She has failed corticosteroid injection and other anti-  inflammatory medicines, and we discussed treatment options with her that  being total knee replacement.   PERTINENT LABORATORY AND X-RAY FINDINGS:  WBCs 6.3, RBCs 3.73,  hemoglobin 12.0, hematocrit 35.3, and platelets 136.  Protime 25.1 with  an INR of 2.1.  Last testing sodium 136, potassium 3.6, chloride 101,  glucose 114, BUN 7, and creatinine 0.83.   COURSE IN THE HOSPITAL:  She was admitted postoperatively and placed on  variety p.o. and IM analgesics for pain.  PCA pump was used for pain  control.  She was kept on her home medicines which will be outlined at  the end of this dictation.  IV Ancef 1 g q.8 h. x3 doses along with  antiemetics that were given appropriately.  CPM machine 0-50 degrees as  tolerated and advanced.  Knee-high TEDs, incentive spirometry, and then  activity per physical therapy, weightbearing as tolerated.  During her  hospital stay, she had an uncomplicated course.  She had  breath sounds  in all lung fields.  Abdomen was soft.  Her Foley catheter was  discontinued on first day postop and was working well with physical  therapy.  She was transitioned over to oral pain medications.  Dressing  was changed.  Wound was noted to be benign.  Drain was pulled.  No sign  of infection noted with calf being soft and nontender.  She was  discharged home.   CONDITION ON DISCHARGE:  Improved.   FOLLOWUP:  On a low-sodium heart-healthy diet, maybe weightbearing as  tolerated.  May change her dressing daily.  Return to see Dr. Jerl [name]  in 10 days, calling [phone] for an appointment or if there are any  difficulties, i.e., infection or other problems.  [facility] will  provide home physical therapy and INRs.  She will be on Coumadin for 2  weeks, and the dose living in the hospital was 5 mg once a day, Percocet  1 or 2 every 4-6 hours for pain and then her home medicines which are  Cymbalta 30 mg 2 pills once  a day, Neurontin 300 mg 2 in the morning, 2  in the evening, albuterol inhaler as needed, prednisone 20 mg for 5  days, and azithromycin 250 mg for 5 days.       Michelle [name], P.A.      Michelle [name], M.D.  Electronically Signed    MC/MEDQ  D:  01/16/2008  T:  01/16/2008  Job:  [id number]

## 2010-06-17 ENCOUNTER — Emergency Department (HOSPITAL_COMMUNITY): Payer: Medicaid Other

## 2010-06-17 ENCOUNTER — Emergency Department (HOSPITAL_COMMUNITY)
Admission: EM | Admit: 2010-06-17 | Discharge: 2010-06-17 | Disposition: A | Discharge status: Home / Self Care | Payer: Medicaid Other | Attending: Emergency Medicine | Admitting: Emergency Medicine

## 2010-06-17 DIAGNOSIS — J449 Chronic obstructive pulmonary disease, unspecified: Secondary | ICD-10-CM | POA: Insufficient documentation

## 2010-06-17 DIAGNOSIS — M25569 Pain in unspecified knee: Secondary | ICD-10-CM | POA: Insufficient documentation

## 2010-06-17 DIAGNOSIS — J4489 Other specified chronic obstructive pulmonary disease: Secondary | ICD-10-CM | POA: Insufficient documentation

## 2010-07-19 ENCOUNTER — Emergency Department (HOSPITAL_COMMUNITY)
Admission: EM | Admit: 2010-07-19 | Discharge: 2010-07-19 | Disposition: A | Discharge status: Home / Self Care | Payer: Medicaid Other | Attending: Emergency Medicine | Admitting: Emergency Medicine

## 2010-07-19 DIAGNOSIS — F341 Dysthymic disorder: Secondary | ICD-10-CM | POA: Insufficient documentation

## 2010-07-19 DIAGNOSIS — J449 Chronic obstructive pulmonary disease, unspecified: Secondary | ICD-10-CM | POA: Insufficient documentation

## 2010-07-19 DIAGNOSIS — M25569 Pain in unspecified knee: Secondary | ICD-10-CM | POA: Insufficient documentation

## 2010-07-19 DIAGNOSIS — J4489 Other specified chronic obstructive pulmonary disease: Secondary | ICD-10-CM | POA: Insufficient documentation

## 2010-08-11 ENCOUNTER — Ambulatory Visit: Payer: Medicaid Other | Admitting: Rehabilitative and Restorative Service Providers"

## 2010-08-12 ENCOUNTER — Ambulatory Visit: Payer: Medicaid Other | Admitting: Rehabilitative and Restorative Service Providers"

## 2010-08-12 ENCOUNTER — Ambulatory Visit: Payer: Medicaid Other | Attending: Anesthesiology | Admitting: Rehabilitative and Restorative Service Providers"

## 2010-08-12 DIAGNOSIS — M2569 Stiffness of other specified joint, not elsewhere classified: Secondary | ICD-10-CM | POA: Insufficient documentation

## 2010-08-12 DIAGNOSIS — IMO0001 Reserved for inherently not codable concepts without codable children: Secondary | ICD-10-CM | POA: Insufficient documentation

## 2010-08-12 DIAGNOSIS — M2550 Pain in unspecified joint: Secondary | ICD-10-CM | POA: Insufficient documentation

## 2010-08-13 ENCOUNTER — Ambulatory Visit: Payer: Medicaid Other | Admitting: Physical Therapy

## 2010-08-17 ENCOUNTER — Emergency Department (HOSPITAL_COMMUNITY)
Admission: EM | Admit: 2010-08-17 | Discharge: 2010-08-17 | Disposition: A | Discharge status: Home / Self Care | Payer: Medicaid Other | Attending: Emergency Medicine | Admitting: Emergency Medicine

## 2010-08-17 DIAGNOSIS — J4489 Other specified chronic obstructive pulmonary disease: Secondary | ICD-10-CM | POA: Insufficient documentation

## 2010-08-17 DIAGNOSIS — M545 Low back pain, unspecified: Secondary | ICD-10-CM | POA: Insufficient documentation

## 2010-08-17 DIAGNOSIS — J449 Chronic obstructive pulmonary disease, unspecified: Secondary | ICD-10-CM | POA: Insufficient documentation

## 2010-08-17 DIAGNOSIS — F341 Dysthymic disorder: Secondary | ICD-10-CM | POA: Insufficient documentation

## 2010-08-17 DIAGNOSIS — G8929 Other chronic pain: Secondary | ICD-10-CM | POA: Insufficient documentation

## 2010-08-19 ENCOUNTER — Encounter: Payer: Medicaid Other | Admitting: Physical Therapy

## 2010-08-20 ENCOUNTER — Encounter: Payer: Medicaid Other | Admitting: Physical Therapy

## 2010-08-24 ENCOUNTER — Encounter: Payer: Medicaid Other | Admitting: Rehabilitative and Restorative Service Providers"

## 2010-08-26 ENCOUNTER — Encounter: Payer: Medicaid Other | Admitting: Rehabilitative and Restorative Service Providers"

## 2010-08-28 ENCOUNTER — Encounter: Payer: Medicaid Other | Admitting: Rehabilitative and Restorative Service Providers"

## 2010-08-31 ENCOUNTER — Ambulatory Visit: Payer: Medicaid Other | Admitting: Rehabilitative and Restorative Service Providers"

## 2010-09-02 ENCOUNTER — Ambulatory Visit: Payer: Medicaid Other | Admitting: Rehabilitative and Restorative Service Providers"

## 2010-09-07 ENCOUNTER — Ambulatory Visit: Payer: Medicaid Other | Admitting: Rehabilitative and Restorative Service Providers"

## 2010-09-16 ENCOUNTER — Encounter: Payer: Medicaid Other | Admitting: Rehabilitative and Restorative Service Providers"

## 2010-09-18 ENCOUNTER — Emergency Department (HOSPITAL_COMMUNITY)
Admission: EM | Admit: 2010-09-18 | Discharge: 2010-09-18 | Disposition: A | Discharge status: Home / Self Care | Payer: Medicaid Other | Attending: Emergency Medicine | Admitting: Emergency Medicine

## 2010-09-18 DIAGNOSIS — Z9889 Other specified postprocedural states: Secondary | ICD-10-CM | POA: Insufficient documentation

## 2010-09-18 DIAGNOSIS — F341 Dysthymic disorder: Secondary | ICD-10-CM | POA: Insufficient documentation

## 2010-09-18 DIAGNOSIS — M25539 Pain in unspecified wrist: Secondary | ICD-10-CM | POA: Insufficient documentation

## 2010-09-18 DIAGNOSIS — J4489 Other specified chronic obstructive pulmonary disease: Secondary | ICD-10-CM | POA: Insufficient documentation

## 2010-09-18 DIAGNOSIS — R209 Unspecified disturbances of skin sensation: Secondary | ICD-10-CM | POA: Insufficient documentation

## 2010-09-18 DIAGNOSIS — J449 Chronic obstructive pulmonary disease, unspecified: Secondary | ICD-10-CM | POA: Insufficient documentation

## 2010-10-14 LAB — BASIC METABOLIC PANEL
BUN: 7
BUN: 7
BUN: 8
BUN: 8
CO2: 28
CO2: 29
CO2: 34 — ABNORMAL HIGH
CO2: 36 — ABNORMAL HIGH
Calcium: 8.3 — ABNORMAL LOW
Calcium: 8.4
Calcium: 8.4
Calcium: 9.3
Chloride: 101
Chloride: 103
Chloride: 97
Chloride: 98
Creatinine, Ser: 0.83
Creatinine, Ser: 0.86
Creatinine, Ser: 0.89
Creatinine, Ser: 0.93
GFR calc Af Amer: >60
GFR calc Af Amer: >60
GFR calc Af Amer: >60
GFR calc Af Amer: >60
GFR calc non Af Amer: >60
GFR calc non Af Amer: >60
GFR calc non Af Amer: >60
GFR calc non Af Amer: >60
Glucose, Bld: 103 — ABNORMAL HIGH
Glucose, Bld: 114 — ABNORMAL HIGH
Glucose, Bld: 119 — ABNORMAL HIGH
Glucose, Bld: 134 — ABNORMAL HIGH
Potassium: 3.6
Potassium: 3.7
Potassium: 4.1
Potassium: 4.4
Sodium: 136
Sodium: 136
Sodium: 140
Sodium: 140

## 2010-10-14 LAB — PROTIME-INR
INR: 1
INR: 1.1
INR: 1.4
INR: 2.1 — ABNORMAL HIGH
Prothrombin Time: 13.4
Prothrombin Time: 14
Prothrombin Time: 17.5 — ABNORMAL HIGH
Prothrombin Time: 25.1 — ABNORMAL HIGH

## 2010-10-14 LAB — HEPATIC FUNCTION PANEL
ALT: 21
AST: 26
Albumin: 3.4 — ABNORMAL LOW
Alkaline Phosphatase: 101
Bilirubin, Direct: 0.2
Indirect Bilirubin: 0.1 — ABNORMAL LOW
Total Bilirubin: 0.3
Total Protein: 7.2

## 2010-10-14 LAB — CBC
HCT: 35.2 — ABNORMAL LOW
HCT: 35.3 — ABNORMAL LOW
HCT: 39.2
HCT: 44.8
Hemoglobin: 11.9 — ABNORMAL LOW
Hemoglobin: 12
Hemoglobin: 12.9
Hemoglobin: 14.9
MCHC: 33.1
MCHC: 33.3
MCHC: 33.9
MCHC: 34.1
MCV: 94.8
MCV: 94.9
MCV: 95.7
MCV: 96.1
Platelets: 126 — ABNORMAL LOW
Platelets: 129 — ABNORMAL LOW
Platelets: 136 — ABNORMAL LOW
Platelets: 147 — ABNORMAL LOW
RBC: 3.7 — ABNORMAL LOW
RBC: 3.73 — ABNORMAL LOW
RBC: 4.07
RBC: 4.68
RDW: 16.4 — ABNORMAL HIGH
RDW: 16.5 — ABNORMAL HIGH
RDW: 16.5 — ABNORMAL HIGH
RDW: 16.7 — ABNORMAL HIGH
WBC: 4.9
WBC: 6.2
WBC: 6.3
WBC: 6.7

## 2010-10-20 LAB — TYPE AND SCREEN
ABO/RH(D): O POS
Antibody Screen: NEGATIVE

## 2010-10-20 LAB — CBC
HCT: 36.2
HCT: 44.6
Hemoglobin: 12.4
Hemoglobin: 15.3 — ABNORMAL HIGH
MCHC: 34.2
MCHC: 34.3
MCV: 93.1
MCV: 93.7
Platelets: 116 — ABNORMAL LOW
Platelets: 141 — ABNORMAL LOW
RBC: 3.86 — ABNORMAL LOW
RBC: 4.79
RDW: 14.3
RDW: 14.6
WBC: 6.5
WBC: 6.5

## 2010-10-20 LAB — BASIC METABOLIC PANEL
BUN: 10
BUN: 6
CO2: 26
CO2: 29
Calcium: 8.2 — ABNORMAL LOW
Calcium: 8.7
Chloride: 101
Chloride: 102
Creatinine, Ser: 0.96
Creatinine, Ser: 1.02
GFR calc Af Amer: >60
GFR calc Af Amer: >60
GFR calc non Af Amer: 60 — ABNORMAL LOW
GFR calc non Af Amer: >60
Glucose, Bld: 105 — ABNORMAL HIGH
Glucose, Bld: 116 — ABNORMAL HIGH
Potassium: 3.7
Potassium: 3.9
Sodium: 133 — ABNORMAL LOW
Sodium: 137

## 2010-10-20 LAB — HEPATIC FUNCTION PANEL
ALT: 21
AST: 19
Albumin: 3.4 — ABNORMAL LOW
Alkaline Phosphatase: 88
Bilirubin, Direct: 0.2
Indirect Bilirubin: 0.6
Total Bilirubin: 0.8
Total Protein: 6.8

## 2010-10-20 LAB — HEMOGLOBIN A1C
Hgb A1c MFr Bld: 5.6
Mean Plasma Glucose: 122

## 2010-10-20 LAB — ABO/RH: ABO/RH(D): O POS

## 2010-10-23 LAB — WOUND CULTURE: Gram Stain: NONE SEEN

## 2010-10-26 ENCOUNTER — Emergency Department (HOSPITAL_COMMUNITY)
Admission: EM | Admit: 2010-10-26 | Discharge: 2010-10-26 | Disposition: A | Discharge status: Home / Self Care | Payer: Medicaid Other | Attending: Emergency Medicine | Admitting: Emergency Medicine

## 2010-10-26 DIAGNOSIS — G8929 Other chronic pain: Secondary | ICD-10-CM | POA: Insufficient documentation

## 2010-10-26 DIAGNOSIS — M549 Dorsalgia, unspecified: Secondary | ICD-10-CM | POA: Insufficient documentation

## 2010-10-26 DIAGNOSIS — E669 Obesity, unspecified: Secondary | ICD-10-CM | POA: Insufficient documentation

## 2010-11-19 ENCOUNTER — Encounter (HOSPITAL_COMMUNITY): Payer: Self-pay | Admitting: Pharmacy Technician

## 2010-11-19 NOTE — H&P (Signed)
  Dictated [phone]

## 2010-11-19 NOTE — H&P (Signed)
NAME:  ZENIYA, LAPIDUS NO.:  [id number]  MEDICAL RECORD NO.:  [id number]  LOCATION:  PERIO                        FACILITY:  [facility]  PHYSICIAN:  Burnard Bunting, M.D.    DATE OF BIRTH:  1965/09/05  DATE OF ADMISSION:  11/17/2010 DATE OF DISCHARGE:                             HISTORY & PHYSICAL   CHIEF COMPLAINT:  Left hand pain.  HISTORY OF PRESENT ILLNESS:  Michelle [name] is a 45 year old right- hand dominant patient with left hand pain.  I have seen her in the past. She has had an EMG nerve study done and she actually had carpal tunnel release bilaterally done last year by Dr. Channing Mutters.  The right one has done well.  The left one did well for about a month or two and then had persistent and unrelenting pain.  It was hard for her to do her activities of daily living.  She describes pain, numbness, and tingling in thumb index, forefinger. She has been taking Tylenol No. 3, have not wore wrist splint yet.  MEDICATIONS:  Tylenol No. 3, Neurontin, trazodone, Cymbalta, Lasix, Klor- Con.  ALLERGIES:  CODEINE, PENICILLIN.  PAST SURGICAL HISTORY:  Had neck surgery in 2000; knee surgeries, arthroscopies in 2007 and 2009, this was done elsewhere, back surgery in 2008.  FAMILY MEDICAL HISTORY:  Positive for diabetes and cancer.  Esophageal cancer.  SOCIAL HISTORY:  She is single.  Does smoke.  Her occupation is disability.  Occasionally drinks.  REVIEW OF SYSTEMS:  The patient does have sleep apnea.  No family history of deep vein thrombosis.  All other systems reviewed and negative other than related to the left hand.  PHYSICAL EXAMINATION:  GENERAL:  Well developed, well nourished, in no acute distress.  Alert, oriented.  Increased body mass index. CHEST:  Clear to auscultation. HEART:  Regular rate and rhythm. ABDOMEN:  Benign. EXTREMITIES:  She has well-healed surgical incision on the left hand. No flattening of the thenar eminence.  Finger flexion, extension  is intact.  Does have paresthesias and positive carpal tunnel compression test in digits 1 through 4, less on the small finger.  Negative Tinel's. 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  Several masses palpable in the wrist region.  EMG nerve study does show severe carpal tunnel syndrome on the left.  IMPRESSION:  Left hand severe carpal tunnel syndrome.  PLAN:  It is a difficult situation for Michelle [name].  She has EMG nerve study evidence of persistent severe carpal tunnel syndrome.  Her symptoms match that study.  Whether or not this is left over from the previous surgery or whether or not she needs further decompression is difficult to say based on her improvement but relapsed after the initial surgery.  It is possible that she may have developed scar tissue around the nerve.  Discussed with her operative and nonoperative options.  She is going to try splint.  Her pain is pretty severe and unrelenting.  One option would be for revision carpal tunnel surgery with extension of the incision into the wrist.  The risks and benefits of that discussed with the patient including but not limited to, infection, nerve or vessel damage, potentially no pain relief if this is  just basically a scarred and inflamed nerve.  Conceivably RSD could develop.  Whole list of possibilities are discussed.  Because of her quality of life and persistent and unrelenting pain in her hand, she desired surgical correction.  Currently it does not appear that she has RSD in her hand. The color and temperature is symmetric with the right hand.  The patient understands the risks and benefits.  In general for this particular patient, I think she has got about a 70% chance of getting some improvement.  The patient understands.  All questions were answered.     Burnard Bunting, M.D.     GSD/MEDQ  D:  11/19/2010  T:  11/19/2010  Job:  [id number]

## 2010-11-20 ENCOUNTER — Encounter (HOSPITAL_COMMUNITY)
Admission: RE | Admit: 2010-11-20 | Discharge: 2010-11-20 | Disposition: A | Discharge status: Home / Self Care | Payer: Medicaid Other | Source: Ambulatory Visit | Attending: Orthopedic Surgery | Admitting: Orthopedic Surgery

## 2010-11-20 ENCOUNTER — Encounter (HOSPITAL_COMMUNITY): Payer: Self-pay

## 2010-11-20 ENCOUNTER — Other Ambulatory Visit (HOSPITAL_COMMUNITY): Payer: Self-pay | Admitting: Orthopedic Surgery

## 2010-11-20 ENCOUNTER — Other Ambulatory Visit: Payer: Self-pay

## 2010-11-20 HISTORY — DX: Unspecified osteoarthritis, unspecified site: M19.90

## 2010-11-20 HISTORY — DX: Gastro-esophageal reflux disease without esophagitis: K21.9

## 2010-11-20 HISTORY — DX: Depression, unspecified: F32.A

## 2010-11-20 HISTORY — DX: Anxiety disorder, unspecified: F41.9

## 2010-11-20 HISTORY — DX: Major depressive disorder, single episode, unspecified: F32.9

## 2010-11-20 LAB — CBC
HCT: 46.1 % — ABNORMAL HIGH (ref 36.0–46.0)
Hemoglobin: 15.8 g/dL — ABNORMAL HIGH (ref 12.0–15.0)
MCH: 31.3 pg (ref 26.0–34.0)
MCHC: 34.3 g/dL (ref 30.0–36.0)
MCV: 91.3 fL (ref 78.0–100.0)
Platelets: 135 10*3/uL — ABNORMAL LOW (ref 150–400)
RBC: 5.05 MIL/uL (ref 3.87–5.11)
RDW: 14.5 % (ref 11.5–15.5)
WBC: 6.7 10*3/uL (ref 4.0–10.5)

## 2010-11-20 LAB — BASIC METABOLIC PANEL
BUN: 9 mg/dL (ref 6–23)
CO2: 28 mEq/L (ref 19–32)
Calcium: 9.7 mg/dL (ref 8.4–10.5)
Chloride: 99 mEq/L (ref 96–112)
Creatinine, Ser: 1.22 mg/dL — ABNORMAL HIGH (ref 0.50–1.10)
GFR calc Af Amer: 61 mL/min — ABNORMAL LOW (ref >90)
GFR calc non Af Amer: 53 mL/min — ABNORMAL LOW (ref >90)
Glucose, Bld: 100 mg/dL — ABNORMAL HIGH (ref 70–99)
Potassium: 4.2 mEq/L (ref 3.5–5.1)
Sodium: 138 mEq/L (ref 135–145)

## 2010-11-20 LAB — HCG, SERUM, QUALITATIVE: Preg, Serum: NEGATIVE

## 2010-11-20 LAB — SURGICAL PCR SCREEN
MRSA, PCR: NEGATIVE
Staphylococcus aureus: POSITIVE — AB

## 2010-11-20 NOTE — Pre-Procedure Instructions (Signed)
20 Lynnix L Milos  11/20/2010   Your procedure is scheduled on:  Monday, Nov 12  Report to [facility] at 0530 AM.  Call this number if you have problems the morning of surgery: [phone]   Remember:   Do not eat food:After Midnight.  Do not drink clear liquids: 4 Hours before arrival.  Take these medicines the morning of surgery with A SIP OF WATER: Cymbalta,Pepcid,Neurontin,Hydroxyzine   Do not wear jewelry, make-up or nail polish.  Do not wear lotions, powders, or perfumes. You may wear deodorant.  Do not shave 48 hours prior to surgery.  Do not bring valuables to the hospital.  Contacts, dentures or bridgework may not be worn into surgery.  Leave suitcase in the car. After surgery it may be brought to your room.  For patients admitted to the hospital, checkout time is 11:00 AM the day of discharge.   Patients discharged the day of surgery will not be allowed to drive home.  Name and phone number of your driver:mother or friend, Tobi Bastos  Special Instructions: CHG Shower Use Special Wash: 1/2 bottle night before surgery and 1/2 bottle morning of surgery.   Please read over the following fact sheets that you were given: Pain Booklet, Coughing and Deep Breathing, MRSA Information and Surgical Site Infection Prevention

## 2010-11-22 MED ORDER — CLINDAMYCIN PHOSPHATE 600 MG/50ML IV SOLN
600.0000 mg | INTRAVENOUS | Status: DC
  Filled 2010-11-22: qty 50

## 2010-11-23 ENCOUNTER — Ambulatory Visit (HOSPITAL_COMMUNITY): Payer: Medicaid Other | Admitting: Anesthesiology

## 2010-11-23 ENCOUNTER — Encounter (HOSPITAL_COMMUNITY)
Admission: RE | Disposition: A | Discharge status: Home / Self Care | Payer: Self-pay | Source: Ambulatory Visit | Attending: Orthopedic Surgery

## 2010-11-23 ENCOUNTER — Encounter (HOSPITAL_COMMUNITY): Payer: Self-pay | Admitting: Anesthesiology

## 2010-11-23 ENCOUNTER — Ambulatory Visit (HOSPITAL_COMMUNITY)
Admission: RE | Admit: 2010-11-23 | Discharge: 2010-11-23 | Disposition: A | Discharge status: Home / Self Care | Payer: Medicaid Other | Source: Ambulatory Visit | Attending: Orthopedic Surgery | Admitting: Orthopedic Surgery

## 2010-11-23 DIAGNOSIS — F3289 Other specified depressive episodes: Secondary | ICD-10-CM | POA: Insufficient documentation

## 2010-11-23 DIAGNOSIS — Z01810 Encounter for preprocedural cardiovascular examination: Secondary | ICD-10-CM | POA: Insufficient documentation

## 2010-11-23 DIAGNOSIS — J45909 Unspecified asthma, uncomplicated: Secondary | ICD-10-CM | POA: Insufficient documentation

## 2010-11-23 DIAGNOSIS — R51 Headache: Secondary | ICD-10-CM | POA: Insufficient documentation

## 2010-11-23 DIAGNOSIS — E119 Type 2 diabetes mellitus without complications: Secondary | ICD-10-CM | POA: Insufficient documentation

## 2010-11-23 DIAGNOSIS — F329 Major depressive disorder, single episode, unspecified: Secondary | ICD-10-CM | POA: Insufficient documentation

## 2010-11-23 DIAGNOSIS — F411 Generalized anxiety disorder: Secondary | ICD-10-CM | POA: Insufficient documentation

## 2010-11-23 DIAGNOSIS — G5600 Carpal tunnel syndrome, unspecified upper limb: Secondary | ICD-10-CM | POA: Insufficient documentation

## 2010-11-23 DIAGNOSIS — Z01818 Encounter for other preprocedural examination: Secondary | ICD-10-CM | POA: Insufficient documentation

## 2010-11-23 DIAGNOSIS — K219 Gastro-esophageal reflux disease without esophagitis: Secondary | ICD-10-CM | POA: Insufficient documentation

## 2010-11-23 DIAGNOSIS — Z8711 Personal history of peptic ulcer disease: Secondary | ICD-10-CM | POA: Insufficient documentation

## 2010-11-23 DIAGNOSIS — Z01812 Encounter for preprocedural laboratory examination: Secondary | ICD-10-CM | POA: Insufficient documentation

## 2010-11-23 HISTORY — PX: CARPAL TUNNEL RELEASE: SHX101

## 2010-11-23 LAB — GLUCOSE, CAPILLARY
Glucose-Capillary: 115 mg/dL — ABNORMAL HIGH (ref 70–99)
Glucose-Capillary: 109 mg/dL — ABNORMAL HIGH (ref 70–99)
Glucose-Capillary: 105 mg/dL — ABNORMAL HIGH (ref 70–99)

## 2010-11-23 SURGERY — CARPAL TUNNEL RELEASE
Anesthesia: General | Site: Wrist | Laterality: Left | Wound class: Clean

## 2010-11-23 MED ORDER — BUPIVACAINE HCL (PF) 0.25 % IJ SOLN
INTRAMUSCULAR | Status: DC | PRN
  Administered 2010-11-23: 10 mL

## 2010-11-23 MED ORDER — SODIUM CHLORIDE 0.9 % IR SOLN
Status: DC | PRN
  Administered 2010-11-23: 1000 mL

## 2010-11-23 MED ORDER — CLINDAMYCIN PHOSPHATE 600 MG/50ML IV SOLN
INTRAVENOUS | Status: DC | PRN
  Administered 2010-11-23: 600 mg via INTRAVENOUS

## 2010-11-23 MED ORDER — LORAZEPAM 2 MG/ML IJ SOLN: 1.0000 mg | Freq: Once | INTRAMUSCULAR | Status: DC | PRN

## 2010-11-23 MED ORDER — LACTATED RINGERS IV SOLN
INTRAVENOUS | Status: DC | PRN
  Administered 2010-11-23 (×2): via INTRAVENOUS

## 2010-11-23 MED ORDER — ACETAMINOPHEN 10 MG/ML IV SOLN: 1000.0000 mg | Freq: Once | INTRAVENOUS | Status: DC | PRN

## 2010-11-23 MED ORDER — FENTANYL CITRATE 0.05 MG/ML IJ SOLN
INTRAMUSCULAR | Status: DC | PRN
  Administered 2010-11-23: 50 ug via INTRAVENOUS
  Administered 2010-11-23: 100 ug via INTRAVENOUS
  Administered 2010-11-23: 50 ug via INTRAVENOUS

## 2010-11-23 MED ORDER — OXYCODONE-ACETAMINOPHEN 10-325 MG PO TABS: 1.0000 | ORAL_TABLET | ORAL | Status: AC | PRN

## 2010-11-23 MED ORDER — PROPOFOL 10 MG/ML IV EMUL
INTRAVENOUS | Status: DC | PRN
  Administered 2010-11-23: 30 mg via INTRAVENOUS
  Administered 2010-11-23: 200 mg via INTRAVENOUS
  Administered 2010-11-23: 20 mg via INTRAVENOUS

## 2010-11-23 MED ORDER — PROMETHAZINE HCL 25 MG/ML IJ SOLN: 6.2500 mg | INTRAMUSCULAR | Status: DC | PRN

## 2010-11-23 MED ORDER — ONDANSETRON HCL 4 MG/2ML IJ SOLN
INTRAMUSCULAR | Status: DC | PRN
  Administered 2010-11-23: 4 mg via INTRAVENOUS

## 2010-11-23 MED ORDER — MIDAZOLAM HCL 5 MG/5ML IJ SOLN
INTRAMUSCULAR | Status: DC | PRN
  Administered 2010-11-23 (×2): 1 mg via INTRAVENOUS

## 2010-11-23 MED ORDER — HYDROMORPHONE HCL PF 1 MG/ML IJ SOLN
0.2500 mg | INTRAMUSCULAR | Status: DC | PRN
  Administered 2010-11-23 (×2): 0.5 mg via INTRAVENOUS

## 2010-11-23 MED ORDER — CHLORHEXIDINE GLUCONATE 4 % EX LIQD: 60.0000 mL | Freq: Once | CUTANEOUS | Status: DC

## 2010-11-23 SURGICAL SUPPLY — 44 items
BANDAGE ELASTIC 3 VELCRO ST LF (GAUZE/BANDAGES/DRESSINGS) ×4 IMPLANT
BANDAGE ELASTIC 4 VELCRO ST LF (GAUZE/BANDAGES/DRESSINGS) ×2 IMPLANT
BANDAGE GAUZE ELAST BULKY 4 IN (GAUZE/BANDAGES/DRESSINGS) ×2 IMPLANT
BNDG ESMARK 4X9 LF (GAUZE/BANDAGES/DRESSINGS) IMPLANT
CLOTH BEACON ORANGE TIMEOUT ST (SAFETY) ×2 IMPLANT
CORDS BIPOLAR (ELECTRODE) ×2 IMPLANT
COVER SURGICAL LIGHT HANDLE (MISCELLANEOUS) ×2 IMPLANT
CUFF TOURNIQUET SINGLE 18IN (TOURNIQUET CUFF) ×2 IMPLANT
CUFF TOURNIQUET SINGLE 24IN (TOURNIQUET CUFF) IMPLANT
DRAPE SURG 17X23 STRL (DRAPES) ×2 IMPLANT
DRSG PAD ABDOMINAL 8X10 ST (GAUZE/BANDAGES/DRESSINGS) ×2 IMPLANT
DURAPREP 26ML APPLICATOR (WOUND CARE) ×2 IMPLANT
GAUZE SPONGE 4X4 12PLY STRL LF (GAUZE/BANDAGES/DRESSINGS) ×2 IMPLANT
GAUZE XEROFORM 1X8 LF (GAUZE/BANDAGES/DRESSINGS) ×2 IMPLANT
GLOVE BIOGEL PI IND STRL 8 (GLOVE) ×1 IMPLANT
GLOVE BIOGEL PI INDICATOR 8 (GLOVE) ×1
GLOVE SURG ORTHO 8.0 STRL STRW (GLOVE) ×2 IMPLANT
GOWN PREVENTION PLUS LG XLONG (DISPOSABLE) IMPLANT
GOWN PREVENTION PLUS XLARGE (GOWN DISPOSABLE) ×2 IMPLANT
GOWN STRL NON-REIN LRG LVL3 (GOWN DISPOSABLE) ×4 IMPLANT
KIT BASIN OR (CUSTOM PROCEDURE TRAY) ×2 IMPLANT
KIT ROOM TURNOVER OR (KITS) ×2 IMPLANT
LOOP VESSEL MAXI BLUE (MISCELLANEOUS) IMPLANT
NEEDLE HYPO 25GX1X1/2 BEV (NEEDLE) ×2 IMPLANT
NS IRRIG 1000ML POUR BTL (IV SOLUTION) ×2 IMPLANT
PACK ORTHO EXTREMITY (CUSTOM PROCEDURE TRAY) ×2 IMPLANT
PAD ARMBOARD 7.5X6 YLW CONV (MISCELLANEOUS) ×2 IMPLANT
PAD CAST 4YDX4 CTTN HI CHSV (CAST SUPPLIES) ×2 IMPLANT
PADDING CAST COTTON 4X4 STRL (CAST SUPPLIES) ×2
PADDING WEBRIL 4 STERILE (GAUZE/BANDAGES/DRESSINGS) ×2 IMPLANT
SPLINT PLASTER X FASTSET 4X15 (CAST SUPPLIES) ×2 IMPLANT
SPONGE GAUZE 4X4 12PLY (GAUZE/BANDAGES/DRESSINGS) ×2 IMPLANT
SUCTION FRAZIER TIP 10 FR DISP (SUCTIONS) ×2 IMPLANT
SUT ETHILON 3 0 PS 1 (SUTURE) ×4 IMPLANT
SUT VIC AB 2-0 CT1 27 (SUTURE) ×1
SUT VIC AB 2-0 CT1 TAPERPNT 27 (SUTURE) ×1 IMPLANT
SUT VIC AB 3-0 FS2 27 (SUTURE) IMPLANT
SYR CONTROL 10ML LL (SYRINGE) ×2 IMPLANT
SYSTEM CHEST DRAIN TLS 7FR (DRAIN) IMPLANT
TOWEL OR 17X24 6PK STRL BLUE (TOWEL DISPOSABLE) ×2 IMPLANT
TOWEL OR 17X26 10 PK STRL BLUE (TOWEL DISPOSABLE) ×2 IMPLANT
TUBE CONNECTING 12X1/4 (SUCTIONS) ×2 IMPLANT
UNDERPAD 30X30 INCONTINENT (UNDERPADS AND DIAPERS) ×2 IMPLANT
WATER STERILE IRR 1000ML POUR (IV SOLUTION) ×2 IMPLANT

## 2010-11-23 NOTE — Anesthesia Preprocedure Evaluation (Signed)
Anesthesia Evaluation  Patient identified by MRN, date of birth, ID band Patient awake    Reviewed: Allergy & Precautions, H&P , NPO status , Patient's Chart, lab work & pertinent test results  Airway Mallampati: II TM Distance: >3 FB Neck ROM: Full    Dental   Pulmonary asthma ,    Pulmonary exam normal       Cardiovascular     Neuro/Psych  Headaches, PSYCHIATRIC DISORDERS Anxiety Depression  Neuromuscular disease    GI/Hepatic PUD, GERD-  ,  Endo/Other  Diabetes mellitus-, Type obesity  Renal/GU      Musculoskeletal   Abdominal (+) obese,   Peds  Hematology   Anesthesia Other Findings   Reproductive/Obstetrics                           Anesthesia Physical Anesthesia Plan  ASA: III  Anesthesia Plan: General   Post-op Pain Management:    Induction: Intravenous  Airway Management Planned: LMA  Additional Equipment:   Intra-op Plan:   Post-operative Plan: Extubation in OR  Informed Consent: I have reviewed the patients History and Physical, chart, labs and discussed the procedure including the risks, benefits and alternatives for the proposed anesthesia with the patient or authorized representative who has indicated his/her understanding and acceptance.     Plan Discussed with: CRNA and Surgeon  Anesthesia Plan Comments:         Anesthesia Quick Evaluation

## 2010-11-23 NOTE — Anesthesia Procedure Notes (Signed)
Procedures

## 2010-11-23 NOTE — Anesthesia Postprocedure Evaluation (Signed)
  Anesthesia Post-op Note  Patient: Michelle [name]  Procedure(s) Performed:  CARPAL TUNNEL RELEASE - revision left carpal tunnel release neurolysis of median nerve  Patient Location: PACU  Anesthesia Type: General  Level of Consciousness: awake, alert  and oriented  Airway and Oxygen Therapy: Patient Spontanous Breathing  Post-op Pain: mild  Post-op Assessment: Post-op Vital signs reviewed and Patient's Cardiovascular Status Stable  Post-op Vital Signs: stable  Complications: No apparent anesthesia complications

## 2010-11-23 NOTE — Brief Op Note (Signed)
11/23/2010  8:35 AM  PATIENT:  Michelle [name]  45 y.o. female  PRE-OPERATIVE DIAGNOSIS:  left carpal tunnel syndrome  POST-OPERATIVE DIAGNOSIS:  Left carpal tunnel syndrome  PROCEDURE:  Procedure(s): CARPAL TUNNEL RELEASE,revision with median nerve neurolysis  SURGEON:  Surgeon(s): Cammy Copa  ASSISTANT:   ANESTHESIA:   general  EBL: 10 ml    Total I/O In: 1000 [I.V.:1000] Out: -   BLOOD ADMINISTERED: none  DRAINS: none   LOCAL MEDICATIONS USED:  marcaine SPECIMEN:  No Specimen  COUNTS:  YES  TOURNIQUET:   Total Tourniquet Time Documented: Upper Arm (Left) - 18 minutes  DICTATION: .Other Dictation: Dictation Number [id number]  PLAN OF CARE: Discharge to home after PACU  PATIENT DISPOSITION:  PACU - hemodynamically stable

## 2010-11-23 NOTE — Op Note (Signed)
NAME:  Michelle [name], Michelle [name]:  [id number]  MEDICAL RECORD NO.:  [id number]  LOCATION:  [facility]                         FACILITY:  [facility]  PHYSICIAN:  Burnard Bunting, M.D.    DATE OF BIRTH:  04-15-1965  DATE OF PROCEDURE:  11/23/2010 DATE OF DISCHARGE:  11/23/2010                              OPERATIVE REPORT   PREOPERATIVE DIAGNOSIS:  Left carpal tunnel syndrome.  POSTOPERATIVE DIAGNOSIS:  Left carpal tunnel syndrome.  PROCEDURE:  Revision of left carpal tunnel release with median nerve neurolysis.  SURGEON:  Burnard Bunting, MD  ASSISTANT:  None.  ANESTHESIA:  General endotracheal.  ESTIMATED BLOOD LOSS:  10 mL.  DRAINS:  None.  INDICATIONS:  The patient underwent carpal tunnel release about a year ago.  She had some relief of symptoms, but has had recurrent pain and symptoms in her hand.  She presents now for revision median nerve neurolysis and carpal tunnel release after failure of conservative management and severe carpal tunnel syndrome by EMG studies.  PROCEDURE IN DETAIL:  The patient was brought to the operating room where general endotracheal anesthesia was induced.  Preoperative antibiotics administered.  Left hand was prescrubbed with alcohol and Betadine, which was allowed to air dry.  Prepped with DuraPrep solution, draped in sterile manner.  Time-out was called.  Arm was elevated and exsanguinated with Esmarch wrap.  Tourniquet was inflated.  Incision was made at the intersection of Kaplan's cardinal line and radial border of the forth finger extending proximally past the wrist flexion crease in curvilinear fashion.  Skin and subcutaneous tissues were sharply divided.  The median nerve was identified proximal to the wrist flexion crease.  Dissection was carried distally.  Significant scar tissue and re-accumulation of the transverse carpal ligament was encountered. Significant compression was present on the nerve.  Neurolysis was performed  after complete decompression.  Care was taken to avoid injury to the motor branch.  No other anatomic abnormalities or cysts were present within the carpal tunnel itself.  The nerve had significant flattening in the area of the scar tissue formation.  Proximal to the flattening was bulbous swelling.  Following complete carpal tunnel release and median nerve neurolysis, nerve was free from all adhesions. The tourniquet was released.  Bleeding points were controlled with electrocautery.  Marcaine was used to anesthetize the skin.  The patient tolerated the procedure well without immediate complications.  Incision was closed using 3-0 nylon.  Bulky well-padded splint was applied leaving fingers free.     Burnard Bunting, M.D.     GSD/MEDQ  D:  11/23/2010  T:  11/23/2010  Job:  [id number]

## 2010-11-23 NOTE — Interval H&P Note (Signed)
History and Physical Interval Note:   11/23/2010   7:23 AM   Michelle [name]  has presented today for surgery, with the diagnosis of left carpal tunnel syndrome  The various methods of treatment have been discussed with the patient and family. After consideration of risks, benefits and other options for treatment, the patient has consented to  Procedure(s): CARPAL TUNNEL RELEASE as a surgical intervention .  The patients' history has been reviewed, patient examined, no change in status, stable for surgery.  I have reviewed the patients' chart and labs.  Questions were answered to the patient's satisfaction.     Cammy Copa  MD

## 2010-11-23 NOTE — Transfer of Care (Signed)
Immediate Anesthesia Transfer of Care Note  Patient: Michelle [name]  Procedure(s) Performed:  CARPAL TUNNEL RELEASE - revision left carpal tunnel release neurolysis of median nerve  Patient Location: PACU  Anesthesia Type: General  Level of Consciousness: awake, alert , patient cooperative and responds to stimulation  Airway & Oxygen Therapy: Patient Spontanous Breathing and Patient connected to nasal cannula oxygen  Post-op Assessment: Report given to PACU RN, Post -op Vital signs reviewed and stable, Patient moving all extremities and Patient able to stick tongue midline  Post vital signs: Reviewed and stable  Complications: No apparent anesthesia complications

## 2010-11-23 NOTE — Preoperative (Signed)
Beta Blockers   Reason not to administer Beta Blockers:Not Applicable 

## 2010-11-26 ENCOUNTER — Encounter (HOSPITAL_COMMUNITY): Payer: Self-pay | Admitting: Orthopedic Surgery

## 2011-02-28 ENCOUNTER — Emergency Department (HOSPITAL_COMMUNITY): Payer: Medicaid Other

## 2011-02-28 ENCOUNTER — Encounter (HOSPITAL_COMMUNITY): Payer: Self-pay | Admitting: *Deleted

## 2011-02-28 ENCOUNTER — Emergency Department (HOSPITAL_COMMUNITY)
Admission: EM | Admit: 2011-02-28 | Discharge: 2011-02-28 | Disposition: A | Discharge status: Home / Self Care | Payer: Medicaid Other | Attending: Emergency Medicine | Admitting: Emergency Medicine

## 2011-02-28 DIAGNOSIS — J45909 Unspecified asthma, uncomplicated: Secondary | ICD-10-CM | POA: Insufficient documentation

## 2011-02-28 DIAGNOSIS — F17200 Nicotine dependence, unspecified, uncomplicated: Secondary | ICD-10-CM | POA: Insufficient documentation

## 2011-02-28 DIAGNOSIS — K219 Gastro-esophageal reflux disease without esophagitis: Secondary | ICD-10-CM | POA: Insufficient documentation

## 2011-02-28 DIAGNOSIS — M1710 Unilateral primary osteoarthritis, unspecified knee: Secondary | ICD-10-CM | POA: Insufficient documentation

## 2011-02-28 DIAGNOSIS — M479 Spondylosis, unspecified: Secondary | ICD-10-CM | POA: Insufficient documentation

## 2011-02-28 DIAGNOSIS — Z860100 Personal history of colon polyps, unspecified: Secondary | ICD-10-CM | POA: Insufficient documentation

## 2011-02-28 DIAGNOSIS — R109 Unspecified abdominal pain: Secondary | ICD-10-CM | POA: Insufficient documentation

## 2011-02-28 DIAGNOSIS — Z96659 Presence of unspecified artificial knee joint: Secondary | ICD-10-CM | POA: Insufficient documentation

## 2011-02-28 DIAGNOSIS — Z79899 Other long term (current) drug therapy: Secondary | ICD-10-CM | POA: Insufficient documentation

## 2011-02-28 DIAGNOSIS — M1610 Unilateral primary osteoarthritis, unspecified hip: Secondary | ICD-10-CM | POA: Insufficient documentation

## 2011-02-28 LAB — COMPREHENSIVE METABOLIC PANEL
ALT: 14 U/L (ref 0–35)
AST: 15 U/L (ref 0–37)
Albumin: 3.3 g/dL — ABNORMAL LOW (ref 3.5–5.2)
Alkaline Phosphatase: 96 U/L (ref 39–117)
BUN: 9 mg/dL (ref 6–23)
CO2: 30 mEq/L (ref 19–32)
Calcium: 9.4 mg/dL (ref 8.4–10.5)
Chloride: 99 mEq/L (ref 96–112)
Creatinine, Ser: 1.06 mg/dL (ref 0.50–1.10)
GFR calc Af Amer: 72 mL/min — ABNORMAL LOW (ref >90)
GFR calc non Af Amer: 62 mL/min — ABNORMAL LOW (ref >90)
Glucose, Bld: 105 mg/dL — ABNORMAL HIGH (ref 70–99)
Potassium: 3.9 mEq/L (ref 3.5–5.1)
Sodium: 138 mEq/L (ref 135–145)
Total Bilirubin: 0.3 mg/dL (ref 0.3–1.2)
Total Protein: 6.9 g/dL (ref 6.0–8.3)

## 2011-02-28 LAB — DIFFERENTIAL
Basophils Absolute: 0 10*3/uL (ref 0.0–0.1)
Basophils Relative: 0 % (ref 0–1)
Eosinophils Absolute: 0.2 10*3/uL (ref 0.0–0.7)
Eosinophils Relative: 4 % (ref 0–5)
Lymphocytes Relative: 40 % (ref 12–46)
Lymphs Abs: 1.9 10*3/uL (ref 0.7–4.0)
Monocytes Absolute: 0.3 10*3/uL (ref 0.1–1.0)
Monocytes Relative: 7 % (ref 3–12)
Neutro Abs: 2.4 10*3/uL (ref 1.7–7.7)
Neutrophils Relative %: 49 % (ref 43–77)

## 2011-02-28 LAB — URINALYSIS, ROUTINE W REFLEX MICROSCOPIC
Bilirubin Urine: NEGATIVE
Glucose, UA: NEGATIVE mg/dL
Hgb urine dipstick: NEGATIVE
Ketones, ur: NEGATIVE mg/dL
Leukocytes, UA: NEGATIVE
Nitrite: NEGATIVE
Protein, ur: NEGATIVE mg/dL
Specific Gravity, Urine: 1.006 (ref 1.005–1.030)
Urobilinogen, UA: 0.2 mg/dL (ref 0.0–1.0)
pH: 6 (ref 5.0–8.0)

## 2011-02-28 LAB — LIPASE, BLOOD: Lipase: 18 U/L (ref 11–59)

## 2011-02-28 LAB — CBC
HCT: 43.6 % (ref 36.0–46.0)
Hemoglobin: 14.9 g/dL (ref 12.0–15.0)
MCH: 30.9 pg (ref 26.0–34.0)
MCHC: 34.2 g/dL (ref 30.0–36.0)
MCV: 90.5 fL (ref 78.0–100.0)
Platelets: 117 10*3/uL — ABNORMAL LOW (ref 150–400)
RBC: 4.82 MIL/uL (ref 3.87–5.11)
RDW: 14.3 % (ref 11.5–15.5)
WBC: 4.8 10*3/uL (ref 4.0–10.5)

## 2011-02-28 MED ORDER — HYDROCODONE-ACETAMINOPHEN 5-325 MG PO TABS: 1.0000 | ORAL_TABLET | Freq: Four times a day (QID) | ORAL | Status: AC | PRN

## 2011-02-28 MED ORDER — HYDROMORPHONE HCL PF 1 MG/ML IJ SOLN
1.0000 mg | Freq: Once | INTRAMUSCULAR | Status: AC
  Administered 2011-02-28: 1 mg via INTRAVENOUS
  Filled 2011-02-28: qty 1

## 2011-02-28 MED ORDER — PANTOPRAZOLE SODIUM 20 MG PO TBEC: 20.0000 mg | DELAYED_RELEASE_TABLET | Freq: Every day | ORAL | Status: DC

## 2011-02-28 MED ORDER — IOHEXOL 300 MG/ML  SOLN
100.0000 mL | Freq: Once | INTRAMUSCULAR | Status: AC | PRN
  Administered 2011-02-28: 100 mL via INTRAVENOUS

## 2011-02-28 MED ORDER — ONDANSETRON HCL 4 MG/2ML IJ SOLN
4.0000 mg | Freq: Once | INTRAMUSCULAR | Status: AC
  Administered 2011-02-28: 4 mg via INTRAVENOUS
  Filled 2011-02-28: qty 2

## 2011-02-28 MED ORDER — SODIUM CHLORIDE 0.9 % IV SOLN
Freq: Once | INTRAVENOUS | Status: AC
  Administered 2011-02-28: 1000 mL via INTRAVENOUS

## 2011-02-28 MED ORDER — IOHEXOL 300 MG/ML  SOLN
40.0000 mL | Freq: Once | INTRAMUSCULAR | Status: AC | PRN
  Administered 2011-02-28: 40 mL via ORAL

## 2011-02-28 NOTE — ED Notes (Signed)
Patient resting while watching TV. NAD at this time.

## 2011-02-28 NOTE — ED Provider Notes (Addendum)
History     CSN: [id number]  Arrival date & time 02/28/11  1653   First MD Initiated Contact with Patient 02/28/11 1706      Chief Complaint  Patient presents with  . Flank Pain    (Consider location/radiation/quality/duration/timing/severity/associated sxs/prior treatment) Patient is a 46 y.o. female presenting with flank pain. The history is provided by the patient (Patient complains of dull dull pain in her right upper quadrant and left lower quadrant. Some nausea no vomiting no diarrhea). No language interpreter was used.  Flank Pain This is a new problem. The current episode started 12 to 24 hours ago. The problem occurs constantly. The problem has not changed since onset.Associated symptoms include abdominal pain. Pertinent negatives include no chest pain and no headaches. The symptoms are aggravated by nothing. The symptoms are relieved by nothing. She has tried nothing for the symptoms. The treatment provided no relief.    Past Medical History  Diagnosis Date  . Asthma   . GERD (gastroesophageal reflux disease)   . Peptic ulcer disease 1984  . Episodic tension type headache   . Arthritis     hips, knees and spine  . Anxiety   . Depression   . Carpal tunnel syndrome of left wrist     Past Surgical History  Procedure Date  . Cervical fusion 2000  . Joint replacement 2009    left knee  . Back surgery 2008    lumbar  fusion  . Cholecystectomy 1991  . Carpal tunnel release 2011    right hand  . Carpal tunnel release 11/23/2010    Procedure: CARPAL TUNNEL RELEASE;  Surgeon: Cammy Copa;  Location: [facility];  Service: Orthopedics;  Laterality: Left;  revision left carpal tunnel release neurolysis of median nerve    History reviewed. No pertinent family history.  History  Substance Use Topics  . Smoking status: Current Everyday Smoker -- 0.5 packs/day for 30 years    Types: Cigarettes  . Smokeless tobacco: Never Used  . Alcohol Use: No    OB History    Grav Para Term Preterm Abortions TAB SAB Ect Mult Living                  Review of Systems  Constitutional: Negative for fatigue.  HENT: Negative for congestion, sinus pressure and ear discharge.   Eyes: Negative for discharge.  Respiratory: Negative for cough.   Cardiovascular: Negative for chest pain.  Gastrointestinal: Positive for abdominal pain. Negative for diarrhea.  Genitourinary: Positive for flank pain. Negative for frequency and hematuria.  Musculoskeletal: Negative for back pain.  Skin: Negative for rash.  Neurological: Negative for seizures and headaches.  Hematological: Negative.   Psychiatric/Behavioral: Negative for hallucinations.    Allergies  Codeine and Penicillins  Home Medications   Current Outpatient Rx  Name Route Sig Dispense Refill  . ALBUTEROL SULFATE HFA 108 (90 BASE) MCG/ACT IN AERS Inhalation Inhale 2 puffs into the lungs 2 (two) times daily as needed. FOR WHEEZING      . DULOXETINE HCL 60 MG PO CPEP Oral Take 60 mg by mouth daily.      Marland Kitchen FAMOTIDINE 40 MG PO TABS Oral Take 40 mg by mouth at bedtime as needed. For acid reflux     . FUROSEMIDE 20 MG PO TABS Oral Take 20 mg by mouth daily.      Marland Kitchen GABAPENTIN 300 MG PO CAPS Oral Take 300 mg by mouth 4 (four) times daily.      Marland Kitchen  HYDROXYZINE HCL 25 MG PO TABS Oral Take 25 mg by mouth 3 (three) times daily.      Marland Kitchen POTASSIUM CHLORIDE 10 MEQ PO TBCR Oral Take 10 mEq by mouth daily.      . TRAMADOL HCL 50 MG PO TABS Oral Take 50-100 mg by mouth every 6 (six) hours as needed. For pain    . TRAZODONE HCL 50 MG PO TABS Oral Take 75 mg by mouth at bedtime.     Marland Kitchen HYDROCODONE-ACETAMINOPHEN 5-325 MG PO TABS Oral Take 1 tablet by mouth every 6 (six) hours as needed for pain. 15 tablet 0  . PANTOPRAZOLE SODIUM 20 MG PO TBEC Oral Take 1 tablet (20 mg total) by mouth daily. 30 tablet 1    BP 107/72  Pulse 71  Temp(Src) 97.5 F (36.4 C) (Oral)  Resp 16  SpO2 94%  LMP 12/23/2010  Physical Exam  Constitutional:  She is oriented to person, place, and time. She appears well-developed.  HENT:  Head: Normocephalic and atraumatic.  Eyes: Conjunctivae and EOM are normal. No scleral icterus.  Neck: Neck supple. No thyromegaly present.  Cardiovascular: Normal rate and regular rhythm.  Exam reveals no gallop and no friction rub.   No murmur heard. Pulmonary/Chest: No stridor. She has no wheezes. She has no rales. She exhibits no tenderness.  Abdominal: She exhibits no distension. There is tenderness. There is no rebound.       Tender rlq and some ruq pain  Musculoskeletal: Normal range of motion. She exhibits no edema.  Lymphadenopathy:    She has no cervical adenopathy.  Neurological: She is oriented to person, place, and time. Coordination normal.  Skin: No rash noted. No erythema.  Psychiatric: She has a normal mood and affect. Her behavior is normal.    ED Course  Procedures (including critical care time)  Labs Reviewed  CBC - Abnormal; Notable for the following:    Platelets 117 (*) PLATELET COUNT CONFIRMED BY SMEAR   All other components within normal limits  COMPREHENSIVE METABOLIC PANEL - Abnormal; Notable for the following:    Glucose, Bld 105 (*)    Albumin 3.3 (*)    GFR calc non Af Amer 62 (*)    GFR calc Af Amer 72 (*)    All other components within normal limits  DIFFERENTIAL  LIPASE, BLOOD  URINALYSIS, ROUTINE W REFLEX MICROSCOPIC   Ct Abdomen Pelvis W Contrast  02/28/2011  *RADIOLOGY REPORT*  Clinical Data: Right flank and abdomen pain.  CT ABDOMEN AND PELVIS WITH CONTRAST  Technique:  Multidetector CT imaging of the abdomen and pelvis was performed following the standard protocol during bolus administration of intravenous contrast.  Contrast:  100 ml Omnipaque-300  Comparison: None.  Findings: Cholecystectomy clips.  Unremarkable liver, spleen, pancreas, adrenal glands, kidneys, urinary bladder, uterus and ovaries.  No gastrointestinal abnormalities or enlarged lymph nodes.   Normal appearing appendix.  Clear lung bases.  Interbody bone plug and screw fixation at the L5-S1 level with 7 mm of anterolisthesis.  IMPRESSION: No acute abnormality.  Original Report Authenticated By: Darrol Angel, M.D.     1. Abdominal pain     Patient had no more abdominal pain and discharge.  MDM    Abdominal pain off lead from adhesions. Patient will followup with her Dr.      Benny Lennert, MD 02/28/11 [id number]  Benny Lennert, MD 02/28/11 [phone]

## 2011-02-28 NOTE — ED Notes (Addendum)
Patient states she was told on Friday by her doctor that she has a kidney trying to shut down. Patient states she has pain in her right side radiating to right flank and right lower abdomen/groin area x 2 days. Patient states she has burning sensation when she urinates. Patient denies chest pain, N/V or fever.

## 2011-02-28 NOTE — ED Notes (Signed)
Pt getting undressed into a gown at this time

## 2011-02-28 NOTE — ED Notes (Signed)
Per EMS- Patient states she has right side abd pain that radiates to right flank area. Patient states she was seen by doctor Monday and on Friday the doctors nurse told patient one of the patients kidney is shutting down. Patient's pain x 2 days. BP 120 palpated HR 80. Patient is DM and is ambulatory.

## 2011-03-17 ENCOUNTER — Encounter (HOSPITAL_COMMUNITY): Payer: Self-pay | Admitting: Family Medicine

## 2011-03-17 ENCOUNTER — Inpatient Hospital Stay (HOSPITAL_COMMUNITY)
Admission: EM | Admit: 2011-03-17 | Discharge: 2011-03-20 | DRG: 392 | Disposition: A | Discharge status: Home / Self Care | Payer: Medicaid Other | Source: Ambulatory Visit | Attending: Family Medicine | Admitting: Family Medicine

## 2011-03-17 DIAGNOSIS — B373 Candidiasis of vulva and vagina: Secondary | ICD-10-CM

## 2011-03-17 DIAGNOSIS — E119 Type 2 diabetes mellitus without complications: Secondary | ICD-10-CM | POA: Diagnosis present

## 2011-03-17 DIAGNOSIS — H6690 Otitis media, unspecified, unspecified ear: Secondary | ICD-10-CM

## 2011-03-17 DIAGNOSIS — M1710 Unilateral primary osteoarthritis, unspecified knee: Secondary | ICD-10-CM

## 2011-03-17 DIAGNOSIS — M542 Cervicalgia: Secondary | ICD-10-CM

## 2011-03-17 DIAGNOSIS — R197 Diarrhea, unspecified: Secondary | ICD-10-CM | POA: Diagnosis present

## 2011-03-17 DIAGNOSIS — K219 Gastro-esophageal reflux disease without esophagitis: Secondary | ICD-10-CM | POA: Diagnosis present

## 2011-03-17 DIAGNOSIS — M1610 Unilateral primary osteoarthritis, unspecified hip: Secondary | ICD-10-CM | POA: Diagnosis present

## 2011-03-17 DIAGNOSIS — K2270 Barrett's esophagus without dysplasia: Secondary | ICD-10-CM | POA: Diagnosis present

## 2011-03-17 DIAGNOSIS — R1115 Cyclical vomiting syndrome unrelated to migraine: Secondary | ICD-10-CM | POA: Diagnosis present

## 2011-03-17 DIAGNOSIS — N951 Menopausal and female climacteric states: Secondary | ICD-10-CM

## 2011-03-17 DIAGNOSIS — E669 Obesity, unspecified: Secondary | ICD-10-CM

## 2011-03-17 DIAGNOSIS — B351 Tinea unguium: Secondary | ICD-10-CM

## 2011-03-17 DIAGNOSIS — E860 Dehydration: Secondary | ICD-10-CM | POA: Diagnosis present

## 2011-03-17 DIAGNOSIS — R112 Nausea with vomiting, unspecified: Secondary | ICD-10-CM | POA: Diagnosis present

## 2011-03-17 DIAGNOSIS — Z8711 Personal history of peptic ulcer disease: Secondary | ICD-10-CM

## 2011-03-17 DIAGNOSIS — R7309 Other abnormal glucose: Secondary | ICD-10-CM

## 2011-03-17 DIAGNOSIS — F17200 Nicotine dependence, unspecified, uncomplicated: Secondary | ICD-10-CM | POA: Diagnosis present

## 2011-03-17 DIAGNOSIS — L299 Pruritus, unspecified: Secondary | ICD-10-CM

## 2011-03-17 DIAGNOSIS — R609 Edema, unspecified: Secondary | ICD-10-CM

## 2011-03-17 DIAGNOSIS — J209 Acute bronchitis, unspecified: Secondary | ICD-10-CM

## 2011-03-17 DIAGNOSIS — R1013 Epigastric pain: Secondary | ICD-10-CM

## 2011-03-17 DIAGNOSIS — L408 Other psoriasis: Secondary | ICD-10-CM

## 2011-03-17 DIAGNOSIS — M5137 Other intervertebral disc degeneration, lumbosacral region: Secondary | ICD-10-CM

## 2011-03-17 DIAGNOSIS — G5600 Carpal tunnel syndrome, unspecified upper limb: Secondary | ICD-10-CM

## 2011-03-17 DIAGNOSIS — H60399 Other infective otitis externa, unspecified ear: Secondary | ICD-10-CM

## 2011-03-17 DIAGNOSIS — IMO0002 Reserved for concepts with insufficient information to code with codable children: Secondary | ICD-10-CM

## 2011-03-17 DIAGNOSIS — F329 Major depressive disorder, single episode, unspecified: Secondary | ICD-10-CM | POA: Diagnosis present

## 2011-03-17 DIAGNOSIS — N179 Acute kidney failure, unspecified: Secondary | ICD-10-CM | POA: Diagnosis present

## 2011-03-17 DIAGNOSIS — R1110 Vomiting, unspecified: Secondary | ICD-10-CM

## 2011-03-17 DIAGNOSIS — M479 Spondylosis, unspecified: Secondary | ICD-10-CM | POA: Diagnosis present

## 2011-03-17 DIAGNOSIS — F411 Generalized anxiety disorder: Secondary | ICD-10-CM | POA: Diagnosis present

## 2011-03-17 DIAGNOSIS — L538 Other specified erythematous conditions: Secondary | ICD-10-CM

## 2011-03-17 DIAGNOSIS — K3184 Gastroparesis: Principal | ICD-10-CM | POA: Diagnosis present

## 2011-03-17 DIAGNOSIS — F3289 Other specified depressive episodes: Secondary | ICD-10-CM | POA: Diagnosis present

## 2011-03-17 DIAGNOSIS — J42 Unspecified chronic bronchitis: Secondary | ICD-10-CM

## 2011-03-17 DIAGNOSIS — B372 Candidiasis of skin and nail: Secondary | ICD-10-CM

## 2011-03-17 DIAGNOSIS — Z96659 Presence of unspecified artificial knee joint: Secondary | ICD-10-CM

## 2011-03-17 LAB — CBC
HCT: 46.7 % — ABNORMAL HIGH (ref 36.0–46.0)
HCT: 45.6 % (ref 36.0–46.0)
Hemoglobin: 16.3 g/dL — ABNORMAL HIGH (ref 12.0–15.0)
Hemoglobin: 16.2 g/dL — ABNORMAL HIGH (ref 12.0–15.0)
MCH: 31.2 pg (ref 26.0–34.0)
MCH: 31.6 pg (ref 26.0–34.0)
MCHC: 34.9 g/dL (ref 30.0–36.0)
MCHC: 35.5 g/dL (ref 30.0–36.0)
MCV: 89.3 fL (ref 78.0–100.0)
MCV: 89.1 fL (ref 78.0–100.0)
Platelets: 164 10*3/uL (ref 150–400)
Platelets: 175 10*3/uL (ref 150–400)
RBC: 5.23 MIL/uL — ABNORMAL HIGH (ref 3.87–5.11)
RBC: 5.12 MIL/uL — ABNORMAL HIGH (ref 3.87–5.11)
RDW: 14.4 % (ref 11.5–15.5)
RDW: 14.4 % (ref 11.5–15.5)
WBC: 6.2 10*3/uL (ref 4.0–10.5)
WBC: 6.5 10*3/uL (ref 4.0–10.5)

## 2011-03-17 LAB — URINE MICROSCOPIC-ADD ON

## 2011-03-17 LAB — DIFFERENTIAL
Basophils Absolute: 0 10*3/uL (ref 0.0–0.1)
Basophils Relative: 0 % (ref 0–1)
Eosinophils Absolute: 0.1 10*3/uL (ref 0.0–0.7)
Eosinophils Relative: 2 % (ref 0–5)
Lymphocytes Relative: 34 % (ref 12–46)
Lymphs Abs: 2.1 10*3/uL (ref 0.7–4.0)
Monocytes Absolute: 0.4 10*3/uL (ref 0.1–1.0)
Monocytes Relative: 7 % (ref 3–12)
Neutro Abs: 3.5 10*3/uL (ref 1.7–7.7)
Neutrophils Relative %: 57 % (ref 43–77)

## 2011-03-17 LAB — URINALYSIS, ROUTINE W REFLEX MICROSCOPIC
Glucose, UA: NEGATIVE mg/dL
Hgb urine dipstick: NEGATIVE
Ketones, ur: NEGATIVE mg/dL
Nitrite: NEGATIVE
Protein, ur: NEGATIVE mg/dL
Specific Gravity, Urine: 1.014 (ref 1.005–1.030)
Urobilinogen, UA: 0.2 mg/dL (ref 0.0–1.0)
pH: 5.5 (ref 5.0–8.0)

## 2011-03-17 LAB — LIPASE, BLOOD: Lipase: 21 U/L (ref 11–59)

## 2011-03-17 LAB — COMPREHENSIVE METABOLIC PANEL
ALT: 14 U/L (ref 0–35)
AST: 18 U/L (ref 0–37)
Albumin: 3.4 g/dL — ABNORMAL LOW (ref 3.5–5.2)
Alkaline Phosphatase: 110 U/L (ref 39–117)
BUN: 12 mg/dL (ref 6–23)
CO2: 27 mEq/L (ref 19–32)
Calcium: 9.9 mg/dL (ref 8.4–10.5)
Chloride: 97 mEq/L (ref 96–112)
Creatinine, Ser: 1.72 mg/dL — ABNORMAL HIGH (ref 0.50–1.10)
GFR calc Af Amer: 40 mL/min — ABNORMAL LOW (ref >90)
GFR calc non Af Amer: 35 mL/min — ABNORMAL LOW (ref >90)
Glucose, Bld: 105 mg/dL — ABNORMAL HIGH (ref 70–99)
Potassium: 3.9 mEq/L (ref 3.5–5.1)
Sodium: 134 mEq/L — ABNORMAL LOW (ref 135–145)
Total Bilirubin: 0.4 mg/dL (ref 0.3–1.2)
Total Protein: 7.3 g/dL (ref 6.0–8.3)

## 2011-03-17 LAB — BASIC METABOLIC PANEL
BUN: 12 mg/dL (ref 6–23)
CO2: 25 mEq/L (ref 19–32)
Calcium: 10 mg/dL (ref 8.4–10.5)
Chloride: 95 mEq/L — ABNORMAL LOW (ref 96–112)
Creatinine, Ser: 1.62 mg/dL — ABNORMAL HIGH (ref 0.50–1.10)
GFR calc Af Amer: 43 mL/min — ABNORMAL LOW (ref >90)
GFR calc non Af Amer: 37 mL/min — ABNORMAL LOW (ref >90)
Glucose, Bld: 108 mg/dL — ABNORMAL HIGH (ref 70–99)
Potassium: 3.6 mEq/L (ref 3.5–5.1)
Sodium: 132 mEq/L — ABNORMAL LOW (ref 135–145)

## 2011-03-17 LAB — PREGNANCY, URINE: Preg Test, Ur: NEGATIVE

## 2011-03-17 MED ORDER — HYDROMORPHONE HCL PF 1 MG/ML IJ SOLN
1.0000 mg | Freq: Once | INTRAMUSCULAR | Status: AC
  Administered 2011-03-17: 1 mg via INTRAVENOUS
  Filled 2011-03-17: qty 1

## 2011-03-17 MED ORDER — ONDANSETRON HCL 4 MG/2ML IJ SOLN
4.0000 mg | Freq: Once | INTRAMUSCULAR | Status: AC
  Administered 2011-03-17: 4 mg via INTRAVENOUS
  Filled 2011-03-17: qty 2

## 2011-03-17 MED ORDER — SODIUM CHLORIDE 0.9 % IV BOLUS (SEPSIS)
1000.0000 mL | Freq: Once | INTRAVENOUS | Status: AC
  Administered 2011-03-17: 1000 mL via INTRAVENOUS

## 2011-03-17 MED ORDER — IOHEXOL 300 MG/ML  SOLN
20.0000 mL | INTRAMUSCULAR | Status: DC
  Administered 2011-03-17: 20 mL via ORAL

## 2011-03-17 MED ORDER — SODIUM CHLORIDE 0.9 % IV BOLUS (SEPSIS)
500.0000 mL | Freq: Once | INTRAVENOUS | Status: AC
  Administered 2011-03-17: 500 mL via INTRAVENOUS

## 2011-03-17 NOTE — ED Notes (Signed)
CT aware pt completed contrast 

## 2011-03-17 NOTE — ED Notes (Signed)
MD at bedside. 

## 2011-03-17 NOTE — ED Notes (Signed)
Patient is resting comfortably. Oral contrast provided.

## 2011-03-17 NOTE — ED Provider Notes (Signed)
History     CSN: [id number]  Arrival date & time 03/17/11  1658   First MD Initiated Contact with Patient 03/17/11 1810      Chief Complaint  Patient presents with  . Emesis  . Diarrhea    (Consider location/radiation/quality/duration/timing/severity/associated sxs/prior treatment) HPI Comments: Patient here with about a two week history of nausea, vomiting, diarrhea and diffuse abdominal pain - she followed up with her PCP today who sent her here for further evaluation - states that she was seen here intially two weeks ago with similar symptoms.  States that she had a CT scan that showed "inflammed intestines" but she was feeling better in the ER and they let her go home.  States that since then she has followed up with her PCP and still feels no better.  Reports that she has lost about 9 pounds is only able to keep water down though she reports that she is "dehydrated" feeling.  Also reports generalized weakness.  Currently without abdominal pain, or vomiting.  Reports LMP was 3 months ago - states is not pregnant.  Patient is a 46 y.o. female presenting with vomiting and diarrhea. The history is provided by the patient and a parent. No language interpreter was used.  Emesis  This is a new problem. The current episode started more than 2 days ago. The problem occurs 2 to 4 times per day. The problem has not changed since onset.The emesis has an appearance of stomach contents. There has been no fever. Associated symptoms include abdominal pain and diarrhea. Pertinent negatives include no arthralgias, no chills, no cough, no fever, no headaches, no myalgias, no sweats and no URI.  Diarrhea The primary symptoms include weight loss, fatigue, abdominal pain, vomiting and diarrhea. Primary symptoms do not include fever, melena, hematemesis, jaundice, hematochezia, dysuria, myalgias, arthralgias or rash. The illness began more than 7 days ago. The onset was gradual. The problem has not changed since  onset. The illness is also significant for anorexia. The illness does not include chills, dysphagia, odynophagia, bloating, constipation, tenesmus, back pain or itching.    Past Medical History  Diagnosis Date  . Asthma   . GERD (gastroesophageal reflux disease)   . Peptic ulcer disease 1984  . Episodic tension type headache   . Arthritis     hips, knees and spine  . Anxiety   . Depression   . Carpal tunnel syndrome of left wrist     Past Surgical History  Procedure Date  . Cervical fusion 2000  . Joint replacement 2009    left knee  . Back surgery 2008    lumbar  fusion  . Cholecystectomy 1991  . Carpal tunnel release 2011    right hand  . Carpal tunnel release 11/23/2010    Procedure: CARPAL TUNNEL RELEASE;  Surgeon: Cammy Copa;  Location: [facility] OR;  Service: Orthopedics;  Laterality: Left;  revision left carpal tunnel release neurolysis of median nerve    History reviewed. No pertinent family history.  History  Substance Use Topics  . Smoking status: Current Everyday Smoker -- 0.5 packs/day for 30 years    Types: Cigarettes  . Smokeless tobacco: Never Used  . Alcohol Use: No    OB History    Grav Para Term Preterm Abortions TAB SAB Ect Mult Living                  Review of Systems  Constitutional: Positive for weight loss and fatigue. Negative for fever and  chills.  Respiratory: Negative for cough.   Gastrointestinal: Positive for vomiting, abdominal pain, diarrhea and anorexia. Negative for dysphagia, constipation, melena, hematochezia, bloating, hematemesis and jaundice.  Genitourinary: Negative for dysuria.  Musculoskeletal: Negative for myalgias, back pain and arthralgias.  Skin: Negative for itching and rash.  Neurological: Negative for headaches.  All other systems reviewed and are negative.    Allergies  Codeine and Penicillins  Home Medications   Current Outpatient Rx  Name Route Sig Dispense Refill  . ALBUTEROL SULFATE HFA 108 (90  BASE) MCG/ACT IN AERS Inhalation Inhale 2 puffs into the lungs 2 (two) times daily as needed. FOR WHEEZING      . DULOXETINE HCL 60 MG PO CPEP Oral Take 60 mg by mouth daily.      Marland Kitchen FAMOTIDINE 40 MG PO TABS Oral Take 40 mg by mouth at bedtime as needed. For acid reflux     . FUROSEMIDE 20 MG PO TABS Oral Take 20 mg by mouth daily.      Marland Kitchen GABAPENTIN 300 MG PO CAPS Oral Take 300 mg by mouth 4 (four) times daily.      Marland Kitchen HYDROXYZINE HCL 25 MG PO TABS Oral Take 25 mg by mouth 3 (three) times daily.      Marland Kitchen POTASSIUM CHLORIDE 10 MEQ PO TBCR Oral Take 10 mEq by mouth daily.      . TRAMADOL HCL 50 MG PO TABS Oral Take 50-100 mg by mouth every 6 (six) hours as needed. For pain    . TRAZODONE HCL 50 MG PO TABS Oral Take 75 mg by mouth at bedtime.       BP 108/67  Pulse 83  Temp(Src) 97.9 F (36.6 C) (Oral)  Resp 20  SpO2 95%  LMP 12/23/2010  Physical Exam  Nursing note and vitals reviewed. Constitutional: She is oriented to person, place, and time. She appears well-developed and well-nourished. No distress.  HENT:  Head: Normocephalic and atraumatic.  Right Ear: External ear normal.  Left Ear: External ear normal.  Nose: Nose normal.  Mouth/Throat: Oropharynx is clear and moist. No oropharyngeal exudate.       endentulous in the front  Eyes: Conjunctivae are normal. Pupils are equal, round, and reactive to light. No scleral icterus.  Neck: Normal range of motion. Neck supple.  Cardiovascular: Normal rate, regular rhythm and normal heart sounds.  Exam reveals no gallop and no friction rub.   No murmur heard. Pulmonary/Chest: Effort normal and breath sounds normal. No respiratory distress. She has no wheezes. She has no rales. She exhibits no tenderness.  Abdominal: Soft. Bowel sounds are normal. She exhibits no distension. There is generalized tenderness. There is no rigidity, no guarding and no CVA tenderness.         Morbidly obese  Musculoskeletal: Normal range of motion. She exhibits  no edema and no tenderness.  Lymphadenopathy:    She has no cervical adenopathy.  Neurological: She is alert and oriented to person, place, and time. No cranial nerve deficit.  Skin: Skin is warm and dry. No rash noted. No erythema. No pallor.  Psychiatric: She has a normal mood and affect. Her behavior is normal. Judgment and thought content normal.    ED Course  Procedures (including critical care time)   Labs Reviewed  BASIC METABOLIC PANEL  CBC  DIFFERENTIAL  CBC  COMPREHENSIVE METABOLIC PANEL  LIPASE, BLOOD  URINALYSIS, ROUTINE W REFLEX MICROSCOPIC   No results found. Results for orders placed during the hospital encounter of  03/17/11  BASIC METABOLIC PANEL      Component Value Range   Sodium 132 (*) 135 - 145 (mEq/L)   Potassium 3.6  3.5 - 5.1 (mEq/L)   Chloride 95 (*) 96 - 112 (mEq/L)   CO2 25  19 - 32 (mEq/L)   Glucose, Bld 108 (*) 70 - 99 (mg/dL)   BUN 12  6 - 23 (mg/dL)   Creatinine, Ser 1.61 (*) 0.50 - 1.10 (mg/dL)   Calcium 09.6  8.4 - 10.5 (mg/dL)   GFR calc non Af Amer 37 (*) >90 (mL/min)   GFR calc Af Amer 43 (*) >90 (mL/min)  CBC      Component Value Range   WBC 6.2  4.0 - 10.5 (K/uL)   RBC 5.23 (*) 3.87 - 5.11 (MIL/uL)   Hemoglobin 16.3 (*) 12.0 - 15.0 (g/dL)   HCT 04.5 (*) 40.9 - 46.0 (%)   MCV 89.3  78.0 - 100.0 (fL)   MCH 31.2  26.0 - 34.0 (pg)   MCHC 34.9  30.0 - 36.0 (g/dL)   RDW 81.1  91.4 - 78.2 (%)   Platelets 164  150 - 400 (K/uL)  DIFFERENTIAL      Component Value Range   Neutrophils Relative 57  43 - 77 (%)   Neutro Abs 3.5  1.7 - 7.7 (K/uL)   Lymphocytes Relative 34  12 - 46 (%)   Lymphs Abs 2.1  0.7 - 4.0 (K/uL)   Monocytes Relative 7  3 - 12 (%)   Monocytes Absolute 0.4  0.1 - 1.0 (K/uL)   Eosinophils Relative 2  0 - 5 (%)   Eosinophils Absolute 0.1  0.0 - 0.7 (K/uL)   Basophils Relative 0  0 - 1 (%)   Basophils Absolute 0.0  0.0 - 0.1 (K/uL)  CBC      Component Value Range   WBC 6.5  4.0 - 10.5 (K/uL)   RBC 5.12 (*) 3.87 -  5.11 (MIL/uL)   Hemoglobin 16.2 (*) 12.0 - 15.0 (g/dL)   HCT 95.6  21.3 - 08.6 (%)   MCV 89.1  78.0 - 100.0 (fL)   MCH 31.6  26.0 - 34.0 (pg)   MCHC 35.5  30.0 - 36.0 (g/dL)   RDW 57.8  46.9 - 62.9 (%)   Platelets 175  150 - 400 (K/uL)  COMPREHENSIVE METABOLIC PANEL      Component Value Range   Sodium 134 (*) 135 - 145 (mEq/L)   Potassium 3.9  3.5 - 5.1 (mEq/L)   Chloride 97  96 - 112 (mEq/L)   CO2 27  19 - 32 (mEq/L)   Glucose, Bld 105 (*) 70 - 99 (mg/dL)   BUN 12  6 - 23 (mg/dL)   Creatinine, Ser 5.28 (*) 0.50 - 1.10 (mg/dL)   Calcium 9.9  8.4 - 41.3 (mg/dL)   Total Protein 7.3  6.0 - 8.3 (g/dL)   Albumin 3.4 (*) 3.5 - 5.2 (g/dL)   AST 18  0 - 37 (U/L)   ALT 14  0 - 35 (U/L)   Alkaline Phosphatase 110  39 - 117 (U/L)   Total Bilirubin 0.4  0.3 - 1.2 (mg/dL)   GFR calc non Af Amer 35 (*) >90 (mL/min)   GFR calc Af Amer 40 (*) >90 (mL/min)  LIPASE, BLOOD      Component Value Range   Lipase 21  11 - 59 (U/L)  URINALYSIS, ROUTINE W REFLEX MICROSCOPIC      Component Value Range  Color, Urine AMBER (*) YELLOW    APPearance CLOUDY (*) CLEAR    Specific Gravity, Urine 1.014  1.005 - 1.030    pH 5.5  5.0 - 8.0    Glucose, UA NEGATIVE  NEGATIVE (mg/dL)   Hgb urine dipstick NEGATIVE  NEGATIVE    Bilirubin Urine SMALL (*) NEGATIVE    Ketones, ur NEGATIVE  NEGATIVE (mg/dL)   Protein, ur NEGATIVE  NEGATIVE (mg/dL)   Urobilinogen, UA 0.2  0.0 - 1.0 (mg/dL)   Nitrite NEGATIVE  NEGATIVE    Leukocytes, UA TRACE (*) NEGATIVE   URINE MICROSCOPIC-ADD ON      Component Value Range   Squamous Epithelial / LPF MANY (*) RARE    WBC, UA 3-6  <3 (WBC/hpf)   Casts HYALINE CASTS (*) NEGATIVE    Urine-Other MUCOUS PRESENT    PREGNANCY, URINE      Component Value Range   Preg Test, Ur NEGATIVE  NEGATIVE    Ct Abdomen Pelvis Wo Contrast  03/18/2011  *RADIOLOGY REPORT*  Clinical Data: Vomiting and diarrhea.  CT ABDOMEN AND PELVIS WITHOUT CONTRAST  Technique:  Multidetector CT imaging of the  abdomen and pelvis was performed following the standard protocol without intravenous contrast.  Comparison: 02/28/2011  Findings: The liver, spleen, pancreas, adrenal glands, and kidneys are normal.  Terminal ileum and appendix are normal.  There are scattered diverticula in the descending portion of the colon. Uterus and ovaries are normal.  Lung bases are clear.  Gallbladder has been removed.  Chronic slight prominence of the common bile duct, unchanged.  Moderate osteoarthritis of the right hip.  The prior lumbar fusion at L5-S1.  IMPRESSION: No acute abnormalities of the abdomen or pelvis.  Original Report Authenticated By: Gwynn Burly, M.D.   Ct Abdomen Pelvis W Contrast  02/28/2011  *RADIOLOGY REPORT*  Clinical Data: Right flank and abdomen pain.  CT ABDOMEN AND PELVIS WITH CONTRAST  Technique:  Multidetector CT imaging of the abdomen and pelvis was performed following the standard protocol during bolus administration of intravenous contrast.  Contrast:  100 ml Omnipaque-300  Comparison: None.  Findings: Cholecystectomy clips.  Unremarkable liver, spleen, pancreas, adrenal glands, kidneys, urinary bladder, uterus and ovaries.  No gastrointestinal abnormalities or enlarged lymph nodes.  Normal appearing appendix.  Clear lung bases.  Interbody bone plug and screw fixation at the L5-S1 level with 7 mm of anterolisthesis.  IMPRESSION: No acute abnormality.  Original Report Authenticated By: Darrol Angel, M.D.      Intractible vomiting Diarrhea Acute Renal failure   MDM  Patient returns after a several week history of N/V/D - states felt better but then symptoms returned over the weekend - now with ARF and unremarkable CT scan - will admit to [facility] -        Scarlette Calico C. [place], [place] 03/18/11 0150

## 2011-03-17 NOTE — ED Notes (Signed)
Per pt, sent here by Dr. For "bad kidney" and "inflammed intestine". sts vomiting and diarrhea for about 3 days.

## 2011-03-17 NOTE — ED Notes (Signed)
Pt denies needs or complaints at this time. Pt reports pain has improved. Vitals are stable. Pt is sipping second cup of contrast. Resp are unlabored. Bolus completed. IV site is benign. SKin is warm and dry. SR up. Call bell in reach. Will continue to monitor.

## 2011-03-17 NOTE — ED Notes (Signed)
Pt ambulatory to restroom for urine specimen. Slow steady gait without assistance noted.

## 2011-03-18 ENCOUNTER — Emergency Department (HOSPITAL_COMMUNITY): Payer: Medicaid Other

## 2011-03-18 ENCOUNTER — Encounter (HOSPITAL_COMMUNITY): Payer: Self-pay | Admitting: Radiology

## 2011-03-18 DIAGNOSIS — N179 Acute kidney failure, unspecified: Secondary | ICD-10-CM | POA: Diagnosis present

## 2011-03-18 DIAGNOSIS — R197 Diarrhea, unspecified: Secondary | ICD-10-CM | POA: Diagnosis present

## 2011-03-18 DIAGNOSIS — R112 Nausea with vomiting, unspecified: Secondary | ICD-10-CM | POA: Diagnosis present

## 2011-03-18 LAB — GLUCOSE, CAPILLARY
Glucose-Capillary: 100 mg/dL — ABNORMAL HIGH (ref 70–99)
Glucose-Capillary: 108 mg/dL — ABNORMAL HIGH (ref 70–99)
Glucose-Capillary: 107 mg/dL — ABNORMAL HIGH (ref 70–99)
Glucose-Capillary: 121 mg/dL — ABNORMAL HIGH (ref 70–99)

## 2011-03-18 LAB — COMPREHENSIVE METABOLIC PANEL
ALT: 11 U/L (ref 0–35)
AST: 14 U/L (ref 0–37)
Albumin: 2.9 g/dL — ABNORMAL LOW (ref 3.5–5.2)
Alkaline Phosphatase: 101 U/L (ref 39–117)
BUN: 13 mg/dL (ref 6–23)
CO2: 27 mEq/L (ref 19–32)
Calcium: 9 mg/dL (ref 8.4–10.5)
Chloride: 100 mEq/L (ref 96–112)
Creatinine, Ser: 1.61 mg/dL — ABNORMAL HIGH (ref 0.50–1.10)
GFR calc Af Amer: 43 mL/min — ABNORMAL LOW (ref >90)
GFR calc non Af Amer: 37 mL/min — ABNORMAL LOW (ref >90)
Glucose, Bld: 96 mg/dL (ref 70–99)
Potassium: 3.4 mEq/L — ABNORMAL LOW (ref 3.5–5.1)
Sodium: 136 mEq/L (ref 135–145)
Total Bilirubin: 0.3 mg/dL (ref 0.3–1.2)
Total Protein: 6.3 g/dL (ref 6.0–8.3)

## 2011-03-18 LAB — CBC
HCT: 41.4 % (ref 36.0–46.0)
Hemoglobin: 14 g/dL (ref 12.0–15.0)
MCH: 30.6 pg (ref 26.0–34.0)
MCHC: 33.8 g/dL (ref 30.0–36.0)
MCV: 90.4 fL (ref 78.0–100.0)
Platelets: 135 10*3/uL — ABNORMAL LOW (ref 150–400)
RBC: 4.58 MIL/uL (ref 3.87–5.11)
RDW: 14.7 % (ref 11.5–15.5)
WBC: 4.1 10*3/uL (ref 4.0–10.5)

## 2011-03-18 LAB — LIPASE, BLOOD: Lipase: 16 U/L (ref 11–59)

## 2011-03-18 LAB — HEMOGLOBIN A1C
Hgb A1c MFr Bld: 6.3 % — ABNORMAL HIGH (ref <5.7)
Mean Plasma Glucose: 134 mg/dL — ABNORMAL HIGH (ref <117)

## 2011-03-18 LAB — CLOSTRIDIUM DIFFICILE BY PCR: Toxigenic C. Difficile by PCR: NEGATIVE

## 2011-03-18 LAB — MRSA PCR SCREENING: MRSA by PCR: NEGATIVE

## 2011-03-18 MED ORDER — HYDROXYZINE HCL 25 MG PO TABS
25.0000 mg | ORAL_TABLET | Freq: Three times a day (TID) | ORAL | Status: DC
  Administered 2011-03-18 – 2011-03-19 (×5): 25 mg via ORAL
  Filled 2011-03-18 (×6): qty 1

## 2011-03-18 MED ORDER — ONDANSETRON HCL 4 MG/2ML IJ SOLN: 4.0000 mg | Freq: Three times a day (TID) | INTRAMUSCULAR | Status: DC | PRN

## 2011-03-18 MED ORDER — TRAZODONE HCL 50 MG PO TABS
75.0000 mg | ORAL_TABLET | Freq: Every day | ORAL | Status: DC
  Administered 2011-03-18 – 2011-03-19 (×2): 75 mg via ORAL
  Filled 2011-03-18 (×3): qty 1

## 2011-03-18 MED ORDER — HYDROMORPHONE HCL PF 1 MG/ML IJ SOLN
0.5000 mg | INTRAMUSCULAR | Status: DC | PRN
  Administered 2011-03-18 – 2011-03-20 (×8): 0.5 mg via INTRAVENOUS
  Filled 2011-03-18 (×8): qty 1

## 2011-03-18 MED ORDER — ONDANSETRON HCL 4 MG/2ML IJ SOLN: 4.0000 mg | Freq: Four times a day (QID) | INTRAMUSCULAR | Status: DC | PRN

## 2011-03-18 MED ORDER — PNEUMOCOCCAL VAC POLYVALENT 25 MCG/0.5ML IJ INJ
0.5000 mL | INJECTION | INTRAMUSCULAR | Status: AC
  Administered 2011-03-19: 0.5 mL via INTRAMUSCULAR
  Filled 2011-03-18: qty 0.5

## 2011-03-18 MED ORDER — HYDROMORPHONE HCL PF 1 MG/ML IJ SOLN
1.0000 mg | Freq: Once | INTRAMUSCULAR | Status: AC
  Administered 2011-03-18: 1 mg via INTRAVENOUS
  Filled 2011-03-18: qty 1

## 2011-03-18 MED ORDER — ALBUTEROL SULFATE HFA 108 (90 BASE) MCG/ACT IN AERS: 2.0000 | INHALATION_SPRAY | Freq: Two times a day (BID) | RESPIRATORY_TRACT | Status: DC | PRN

## 2011-03-18 MED ORDER — ONDANSETRON HCL 4 MG PO TABS: 4.0000 mg | ORAL_TABLET | Freq: Four times a day (QID) | ORAL | Status: DC | PRN

## 2011-03-18 MED ORDER — PANTOPRAZOLE SODIUM 20 MG PO TBEC
20.0000 mg | DELAYED_RELEASE_TABLET | Freq: Every day | ORAL | Status: DC
  Administered 2011-03-18: 20 mg via ORAL
  Filled 2011-03-18 (×2): qty 1

## 2011-03-18 MED ORDER — GABAPENTIN 300 MG PO CAPS
300.0000 mg | ORAL_CAPSULE | Freq: Four times a day (QID) | ORAL | Status: DC
  Administered 2011-03-18 – 2011-03-20 (×9): 300 mg via ORAL
  Filled 2011-03-18 (×13): qty 1

## 2011-03-18 MED ORDER — SODIUM CHLORIDE 0.9 % IV SOLN
INTRAVENOUS | Status: AC
  Administered 2011-03-18: 02:00:00 via INTRAVENOUS

## 2011-03-18 MED ORDER — ACETAMINOPHEN 650 MG RE SUPP: 650.0000 mg | Freq: Four times a day (QID) | RECTAL | Status: DC | PRN

## 2011-03-18 MED ORDER — SODIUM CHLORIDE 0.9 % IV SOLN: INTRAVENOUS | Status: DC

## 2011-03-18 MED ORDER — TRAMADOL HCL 50 MG PO TABS
50.0000 mg | ORAL_TABLET | Freq: Four times a day (QID) | ORAL | Status: DC | PRN
  Administered 2011-03-19: 100 mg via ORAL
  Filled 2011-03-18 (×2): qty 2

## 2011-03-18 MED ORDER — INSULIN ASPART 100 UNIT/ML ~~LOC~~ SOLN
0.0000 [IU] | Freq: Three times a day (TID) | SUBCUTANEOUS | Status: DC
  Filled 2011-03-18: qty 3

## 2011-03-18 MED ORDER — ACETAMINOPHEN 325 MG PO TABS: 650.0000 mg | ORAL_TABLET | Freq: Four times a day (QID) | ORAL | Status: DC | PRN

## 2011-03-18 MED ORDER — DULOXETINE HCL 60 MG PO CPEP
60.0000 mg | ORAL_CAPSULE | Freq: Every day | ORAL | Status: DC
  Administered 2011-03-18 – 2011-03-19 (×2): 60 mg via ORAL
  Filled 2011-03-18 (×2): qty 1

## 2011-03-18 NOTE — H&P (Signed)
Michelle [name] is an 46 y.o. female.   PCP - [facility] Chief Complaint: Nausea vomiting and diarrhea with abdominal pain. HPI: 46 year old female with the history of asthma, depression and diet-controlled diabetes has been experiencing nausea vomiting abdominal pain and diarrhea off and on for last 2-3 weeks. Patient had originally come to the ER 2 weeks ago and at that time CAT scan of the abdomen and pelvis did not show anything specific. Patient had been following with her PCP. Last 3 days her abdominal pain started recurring which is usually epigastric area radiating to the right flank along with it patient also has been having intractable nausea with vomiting and diarrhea. Was unable to keep anything. Her PCP at her to the ER back again. In the ER yesterday patient had labs drawn which shows mild dehydration with increasing creatinine and a repeat CAT scan does not show anything acute. Patient at this time will be admitted for hydration and further observation. Patient still has some abdominal pain around the epigastric area. The pain increases with food. Eating also increase his nausea vomiting and diarrhea. Denies any chest pain shortness of breath fever chills.  Past Medical History  Diagnosis Date  . Asthma   . GERD (gastroesophageal reflux disease)   . Peptic ulcer disease 1984  . Episodic tension type headache   . Arthritis     hips, knees and spine  . Anxiety   . Depression   . Carpal tunnel syndrome of left wrist     Past Surgical History  Procedure Date  . Cervical fusion 2000  . Joint replacement 2009    left knee  . Back surgery 2008    lumbar  fusion  . Cholecystectomy 1991  . Carpal tunnel release 2011    right hand  . Carpal tunnel release 11/23/2010    Procedure: CARPAL TUNNEL RELEASE;  Surgeon: Cammy Copa;  Location: [facility] OR;  Service: Orthopedics;  Laterality: Left;  revision left carpal tunnel release neurolysis of median nerve    Family History    Problem Relation Age of Onset  . Esophageal cancer Father    Social History:  reports that she has been smoking Cigarettes.  She has a 15 pack-year smoking history. She has never used smokeless tobacco. She reports that she does not drink alcohol or use illicit drugs.  Allergies:  Allergies  Allergen Reactions  . Codeine Itching  . Penicillins Itching and Nausea And Vomiting    REACTION: Rash/Itchy    Medications Prior to Admission  Medication Dose Route Frequency Provider Last Rate Last Dose  . 0.9 %  sodium chloride infusion   Intravenous STAT Frances C. Sanford, PA 100 mL/hr at 03/18/11 0200    . 0.9 %  sodium chloride infusion   Intravenous Continuous Eduard Clos, MD      . acetaminophen (TYLENOL) tablet 650 mg  650 mg Oral Q6H PRN Eduard Clos, MD       Or  . acetaminophen (TYLENOL) suppository 650 mg  650 mg Rectal Q6H PRN Eduard Clos, MD      . albuterol (PROVENTIL HFA;VENTOLIN HFA) 108 (90 BASE) MCG/ACT inhaler 2 puff  2 puff Inhalation BID PRN Eduard Clos, MD      . DULoxetine (CYMBALTA) DR capsule 60 mg  60 mg Oral Daily Eduard Clos, MD      . gabapentin (NEURONTIN) capsule 300 mg  300 mg Oral QID Eduard Clos, MD      .  HYDROmorphone (DILAUDID) injection 0.5 mg  0.5 mg Intravenous Q4H PRN Eduard Clos, MD      . HYDROmorphone (DILAUDID) injection 1 mg  1 mg Intravenous Once Joya Gaskins, MD   1 mg at 03/17/11 1848  . HYDROmorphone (DILAUDID) injection 1 mg  1 mg Intravenous Once Scarlette Calico C. Sanford, [place]   1 mg at 03/17/11 2150  . HYDROmorphone (DILAUDID) injection 1 mg  1 mg Intravenous Once Maren Reamer, NP   1 mg at 03/18/11 0508  . hydrOXYzine (ATARAX/VISTARIL) tablet 25 mg  25 mg Oral TID Eduard Clos, MD      . insulin aspart (novoLOG) injection 0-9 Units  0-9 Units Subcutaneous TID WC Eduard Clos, MD      . ondansetron [facility]) injection 4 mg  4 mg Intravenous Once Joya Gaskins, MD   4  mg at 03/17/11 1845  . ondansetron (ZOFRAN) injection 4 mg  4 mg Intravenous Once Scarlette Calico C. Sanford, [place]   4 mg at 03/17/11 2149  . ondansetron (ZOFRAN) tablet 4 mg  4 mg Oral Q6H PRN Eduard Clos, MD       Or  . ondansetron [facility]) injection 4 mg  4 mg Intravenous Q6H PRN Eduard Clos, MD      . pantoprazole (PROTONIX) EC tablet 20 mg  20 mg Oral Q1200 Eduard Clos, MD      . pneumococcal 23 valent vaccine (PNU-IMMUNE) injection 0.5 mL  0.5 mL Intramuscular Tomorrow-1000 Eduard Clos, MD      . sodium chloride 0.9 % bolus 1,000 mL  1,000 mL Intravenous Once Scarlette Calico C. Sanford, PA   1,000 mL at 03/17/11 2128  . sodium chloride 0.9 % bolus 500 mL  500 mL Intravenous Once Joya Gaskins, MD   500 mL at 03/17/11 1846  . traMADol (ULTRAM) tablet 50-100 mg  50-100 mg Oral Q6H PRN Eduard Clos, MD      . traZODone (DESYREL) tablet 75 mg  75 mg Oral QHS Eduard Clos, MD      . DISCONTD: iohexol (OMNIPAQUE) 300 MG/ML solution 20 mL  20 mL Oral Q1 Hr x 2 Medication Radiologist, MD   20 mL at 03/17/11 2219  . DISCONTD: ondansetron (ZOFRAN) injection 4 mg  4 mg Intravenous Q8H PRN Scarlette Calico C. [place], [place]       Medications Prior to Admission  Medication Sig Dispense Refill  . albuterol (PROVENTIL HFA;VENTOLIN HFA) 108 (90 BASE) MCG/ACT inhaler Inhale 2 puffs into the lungs 2 (two) times daily as needed. FOR WHEEZING        . DULoxetine (CYMBALTA) 60 MG capsule Take 60 mg by mouth daily.        . famotidine (PEPCID) 40 MG tablet Take 40 mg by mouth at bedtime as needed. For acid reflux       . furosemide (LASIX) 20 MG tablet Take 20 mg by mouth daily.        Marland Kitchen gabapentin (NEURONTIN) 300 MG capsule Take 300 mg by mouth 4 (four) times daily.        . hydrOXYzine (ATARAX/VISTARIL) 25 MG tablet Take 25 mg by mouth 3 (three) times daily.        . potassium chloride (KLOR-CON) 10 MEQ CR tablet Take 10 mEq by mouth daily.        . traMADol (ULTRAM) 50 MG tablet  Take 50-100 mg by mouth every 6 (six) hours as needed. For pain      .  traZODone (DESYREL) 50 MG tablet Take 75 mg by mouth at bedtime.         Results for orders placed during the hospital encounter of 03/17/11 (from the past 48 hour(s))  BASIC METABOLIC PANEL     Status: Abnormal   Collection Time   03/17/11  6:14 PM      Component Value Range Comment   Sodium 132 (*) 135 - 145 (mEq/L)    Potassium 3.6  3.5 - 5.1 (mEq/L)    Chloride 95 (*) 96 - 112 (mEq/L)    CO2 25  19 - 32 (mEq/L)    Glucose, Bld 108 (*) 70 - 99 (mg/dL)    BUN 12  6 - 23 (mg/dL)    Creatinine, Ser 1.61 (*) 0.50 - 1.10 (mg/dL)    Calcium 09.6  8.4 - 10.5 (mg/dL)    GFR calc non Af Amer 37 (*) >90 (mL/min)    GFR calc Af Amer 43 (*) >90 (mL/min)   CBC     Status: Abnormal   Collection Time   03/17/11  6:14 PM      Component Value Range Comment   WBC 6.2  4.0 - 10.5 (K/uL)    RBC 5.23 (*) 3.87 - 5.11 (MIL/uL)    Hemoglobin 16.3 (*) 12.0 - 15.0 (g/dL)    HCT 04.5 (*) 40.9 - 46.0 (%)    MCV 89.3  78.0 - 100.0 (fL)    MCH 31.2  26.0 - 34.0 (pg)    MCHC 34.9  30.0 - 36.0 (g/dL)    RDW 81.1  91.4 - 78.2 (%)    Platelets 164  150 - 400 (K/uL)   DIFFERENTIAL     Status: Normal   Collection Time   03/17/11  6:14 PM      Component Value Range Comment   Neutrophils Relative 57  43 - 77 (%)    Neutro Abs 3.5  1.7 - 7.7 (K/uL)    Lymphocytes Relative 34  12 - 46 (%)    Lymphs Abs 2.1  0.7 - 4.0 (K/uL)    Monocytes Relative 7  3 - 12 (%)    Monocytes Absolute 0.4  0.1 - 1.0 (K/uL)    Eosinophils Relative 2  0 - 5 (%)    Eosinophils Absolute 0.1  0.0 - 0.7 (K/uL)    Basophils Relative 0  0 - 1 (%)    Basophils Absolute 0.0  0.0 - 0.1 (K/uL)   CBC     Status: Abnormal   Collection Time   03/17/11  7:24 PM      Component Value Range Comment   WBC 6.5  4.0 - 10.5 (K/uL)    RBC 5.12 (*) 3.87 - 5.11 (MIL/uL)    Hemoglobin 16.2 (*) 12.0 - 15.0 (g/dL)    HCT 95.6  21.3 - 08.6 (%)    MCV 89.1  78.0 - 100.0 (fL)    MCH 31.6   26.0 - 34.0 (pg)    MCHC 35.5  30.0 - 36.0 (g/dL)    RDW 57.8  46.9 - 62.9 (%)    Platelets 175  150 - 400 (K/uL)   COMPREHENSIVE METABOLIC PANEL     Status: Abnormal   Collection Time   03/17/11  7:24 PM      Component Value Range Comment   Sodium 134 (*) 135 - 145 (mEq/L)    Potassium 3.9  3.5 - 5.1 (mEq/L)    Chloride 97  96 - 112 (mEq/L)  CO2 27  19 - 32 (mEq/L)    Glucose, Bld 105 (*) 70 - 99 (mg/dL)    BUN 12  6 - 23 (mg/dL)    Creatinine, Ser 1.61 (*) 0.50 - 1.10 (mg/dL)    Calcium 9.9  8.4 - 10.5 (mg/dL)    Total Protein 7.3  6.0 - 8.3 (g/dL)    Albumin 3.4 (*) 3.5 - 5.2 (g/dL)    AST 18  0 - 37 (U/L)    ALT 14  0 - 35 (U/L)    Alkaline Phosphatase 110  39 - 117 (U/L)    Total Bilirubin 0.4  0.3 - 1.2 (mg/dL)    GFR calc non Af Amer 35 (*) >90 (mL/min)    GFR calc Af Amer 40 (*) >90 (mL/min)   LIPASE, BLOOD     Status: Normal   Collection Time   03/17/11  7:24 PM      Component Value Range Comment   Lipase 21  11 - 59 (U/L)   URINALYSIS, ROUTINE W REFLEX MICROSCOPIC     Status: Abnormal   Collection Time   03/17/11  7:30 PM      Component Value Range Comment   Color, Urine AMBER (*) YELLOW  BIOCHEMICALS MAY BE AFFECTED BY COLOR   APPearance CLOUDY (*) CLEAR     Specific Gravity, Urine 1.014  1.005 - 1.030     pH 5.5  5.0 - 8.0     Glucose, UA NEGATIVE  NEGATIVE (mg/dL)    Hgb urine dipstick NEGATIVE  NEGATIVE     Bilirubin Urine SMALL (*) NEGATIVE     Ketones, ur NEGATIVE  NEGATIVE (mg/dL)    Protein, ur NEGATIVE  NEGATIVE (mg/dL)    Urobilinogen, UA 0.2  0.0 - 1.0 (mg/dL)    Nitrite NEGATIVE  NEGATIVE     Leukocytes, UA TRACE (*) NEGATIVE    URINE MICROSCOPIC-ADD ON     Status: Abnormal   Collection Time   03/17/11  7:30 PM      Component Value Range Comment   Squamous Epithelial / LPF MANY (*) RARE     WBC, UA 3-6  <3 (WBC/hpf)    Casts HYALINE CASTS (*) NEGATIVE     Urine-Other MUCOUS PRESENT     PREGNANCY, URINE     Status: Normal   Collection Time    03/17/11  7:30 PM      Component Value Range Comment   Preg Test, Ur NEGATIVE  NEGATIVE     Ct Abdomen Pelvis Wo Contrast  03/18/2011  *RADIOLOGY REPORT*  Clinical Data: Vomiting and diarrhea.  CT ABDOMEN AND PELVIS WITHOUT CONTRAST  Technique:  Multidetector CT imaging of the abdomen and pelvis was performed following the standard protocol without intravenous contrast.  Comparison: 02/28/2011  Findings: The liver, spleen, pancreas, adrenal glands, and kidneys are normal.  Terminal ileum and appendix are normal.  There are scattered diverticula in the descending portion of the colon. Uterus and ovaries are normal.  Lung bases are clear.  Gallbladder has been removed.  Chronic slight prominence of the common bile duct, unchanged.  Moderate osteoarthritis of the right hip.  The prior lumbar fusion at L5-S1.  IMPRESSION: No acute abnormalities of the abdomen or pelvis.  Original Report Authenticated By: Gwynn Burly, M.D.    Review of Systems  Constitutional: Negative.   HENT: Negative.   Eyes: Negative.   Respiratory: Negative.   Cardiovascular: Negative.   Gastrointestinal: Positive for nausea, vomiting, abdominal pain and  diarrhea.  Genitourinary: Negative.   Musculoskeletal: Negative.   Skin: Negative.   Neurological: Negative.   Endo/Heme/Allergies: Negative.   Psychiatric/Behavioral: Negative.     Blood pressure 108/66, pulse 56, temperature 98.8 F (37.1 C), temperature source Oral, resp. rate 20, height 5\' 8"  (1.727 m), weight 131.997 kg (291 lb), last menstrual period 12/23/2010, SpO2 90.00%. Physical Exam  Constitutional: She is oriented to person, place, and time. She appears well-developed and well-nourished. No distress.  HENT:  Head: Normocephalic and atraumatic.  Right Ear: External ear normal.  Left Ear: External ear normal.  Nose: Nose normal.  Mouth/Throat: Oropharynx is clear and moist. No oropharyngeal exudate.  Eyes: Conjunctivae are normal. Pupils are equal,  round, and reactive to light. Right eye exhibits no discharge. Left eye exhibits no discharge. No scleral icterus.  Neck: Normal range of motion. Neck supple.  Cardiovascular: Normal rate, regular rhythm and normal heart sounds.   Respiratory: Effort normal and breath sounds normal. No respiratory distress. She has no wheezes. She has no rales.  GI: Soft. Bowel sounds are normal. She exhibits no distension. There is no tenderness. There is no rebound.  Musculoskeletal: Normal range of motion. She exhibits no edema and no tenderness.  Neurological: She is alert and oriented to person, place, and time.       Moves all extremities 5/5.  Skin: She is not diaphoretic.     Assessment/Plan #1. Nausea vomiting and diarrhea with abdominal pain - at this time it is nonspecific. CT abdomen and pelvis does not show anything acute. For now we'll keep patient on clear liquid diet and if tolerated slowly advance. Patient may be having a viral syndrome. #2. Dehydration with acute renal failure - probably secondary to her nausea vomiting and diarrhea. Recheck metabolic panel hydration. #3. History of depression and asthma - presently stable, continue home medications. #4. History of diet-controlled diabetes - check hemoglobin A1c and have placed on sliding scale coverage.  CODE STATUS - full code.  Hudsyn Barich N. 03/18/2011, 5:25 AM

## 2011-03-18 NOTE — ED Notes (Signed)
Pt transported to ct scan stable and in no acute distress.

## 2011-03-18 NOTE — ED Notes (Signed)
Pt transported to ready bed stable and in no acute distress at this time by tech.

## 2011-03-18 NOTE — ED Provider Notes (Signed)
Medical screening examination/treatment/procedure(s) were performed by non-physician practitioner and as supervising physician I was immediately available for consultation/collaboration.   Laray Anger, DO 03/18/11 1402

## 2011-03-18 NOTE — Progress Notes (Signed)
[facility] Inpatient Progress Note  03/18/2011  Subjective: Pt says that she is starting to feel better. Less nausea and wants to advance diet.  She is reporting that she wants to have an endoscopy study done.   She has a history of peptic ulcers and has not been evaluated since 1984.  I spoke with her daughter Marchelle Folks and updated her on patient per patient's request.  Pt says she had a black tarry stool 1 week ago.   Objective:  Vital signs in last 24 hours: Filed Vitals:   03/18/11 0200 03/18/11 0321 03/18/11 0500 03/18/11 1342  BP: 98/67 108/66  125/77  Pulse: 67 56  72  Temp:  98.8 F (37.1 C)  98.7 F (37.1 C)  TempSrc:  Oral  Oral  Resp:  20  18  Height:   5\' 8"  (1.727 m)   Weight:   131.997 kg (291 lb)   SpO2: 90% 90%  94%   Weight change:   Intake/Output Summary (Last 24 hours) at 03/18/11 1827 Last data filed at 03/18/11 1600  Gross per 24 hour  Intake   1127 ml  Output      1 ml  Net   1126 ml    Review of Systems Pertinent items are noted in HPI.  Physical Exam Constitutional: She is oriented to person, place, and time. She appears well-developed and well-nourished. No distress.  HENT:  Head: Normocephalic and atraumatic.  Right Ear: External ear normal.  Left Ear: External ear normal.  Nose: Nose normal.  Mouth/Throat: Oropharynx is clear and moist. No oropharyngeal exudate.  Eyes: Conjunctivae are normal. Pupils are equal, round, and reactive to light. Right eye exhibits no discharge. Left eye exhibits no discharge. No scleral icterus.  Neck: Normal range of motion. Neck supple.  Cardiovascular: Normal rate, regular rhythm and normal heart sounds.  Respiratory: Effort normal and breath sounds normal. No respiratory distress. She has no wheezes. She has no rales.  GI: Soft. Bowel sounds are normal. She exhibits no distension. There is mild epigastric and periumbilical  tenderness. There is no rebound ttp.  Musculoskeletal: Normal range of motion. She  exhibits no edema and no tenderness.  Neurological: She is alert and oriented to person, place, and time.  Moves all extremities 5/5.  Skin: She is not diaphoretic.    Lab Results: Results for orders placed during the hospital encounter of 03/17/11 (from the past 24 hour(s))  CBC     Status: Abnormal   Collection Time   03/17/11  7:24 PM      Component Value Range   WBC 6.5  4.0 - 10.5 (K/uL)   RBC 5.12 (*) 3.87 - 5.11 (MIL/uL)   Hemoglobin 16.2 (*) 12.0 - 15.0 (g/dL)   HCT 09.6  04.5 - 40.9 (%)   MCV 89.1  78.0 - 100.0 (fL)   MCH 31.6  26.0 - 34.0 (pg)   MCHC 35.5  30.0 - 36.0 (g/dL)   RDW 81.1  91.4 - 78.2 (%)   Platelets 175  150 - 400 (K/uL)  COMPREHENSIVE METABOLIC PANEL     Status: Abnormal   Collection Time   03/17/11  7:24 PM      Component Value Range   Sodium 134 (*) 135 - 145 (mEq/L)   Potassium 3.9  3.5 - 5.1 (mEq/L)   Chloride 97  96 - 112 (mEq/L)   CO2 27  19 - 32 (mEq/L)   Glucose, Bld 105 (*) 70 - 99 (mg/dL)  BUN 12  6 - 23 (mg/dL)   Creatinine, Ser 0.98 (*) 0.50 - 1.10 (mg/dL)   Calcium 9.9  8.4 - 11.9 (mg/dL)   Total Protein 7.3  6.0 - 8.3 (g/dL)   Albumin 3.4 (*) 3.5 - 5.2 (g/dL)   AST 18  0 - 37 (U/L)   ALT 14  0 - 35 (U/L)   Alkaline Phosphatase 110  39 - 117 (U/L)   Total Bilirubin 0.4  0.3 - 1.2 (mg/dL)   GFR calc non Af Amer 35 (*) >90 (mL/min)   GFR calc Af Amer 40 (*) >90 (mL/min)  LIPASE, BLOOD     Status: Normal   Collection Time   03/17/11  7:24 PM      Component Value Range   Lipase 21  11 - 59 (U/L)  URINALYSIS, ROUTINE W REFLEX MICROSCOPIC     Status: Abnormal   Collection Time   03/17/11  7:30 PM      Component Value Range   Color, Urine AMBER (*) YELLOW    APPearance CLOUDY (*) CLEAR    Specific Gravity, Urine 1.014  1.005 - 1.030    pH 5.5  5.0 - 8.0    Glucose, UA NEGATIVE  NEGATIVE (mg/dL)   Hgb urine dipstick NEGATIVE  NEGATIVE    Bilirubin Urine SMALL (*) NEGATIVE    Ketones, ur NEGATIVE  NEGATIVE (mg/dL)   Protein, ur NEGATIVE   NEGATIVE (mg/dL)   Urobilinogen, UA 0.2  0.0 - 1.0 (mg/dL)   Nitrite NEGATIVE  NEGATIVE    Leukocytes, UA TRACE (*) NEGATIVE   URINE MICROSCOPIC-ADD ON     Status: Abnormal   Collection Time   03/17/11  7:30 PM      Component Value Range   Squamous Epithelial / LPF MANY (*) RARE    WBC, UA 3-6  <3 (WBC/hpf)   Casts HYALINE CASTS (*) NEGATIVE    Urine-Other MUCOUS PRESENT    PREGNANCY, URINE     Status: Normal   Collection Time   03/17/11  7:30 PM      Component Value Range   Preg Test, Ur NEGATIVE  NEGATIVE   COMPREHENSIVE METABOLIC PANEL     Status: Abnormal   Collection Time   03/18/11  6:40 AM      Component Value Range   Sodium 136  135 - 145 (mEq/L)   Potassium 3.4 (*) 3.5 - 5.1 (mEq/L)   Chloride 100  96 - 112 (mEq/L)   CO2 27  19 - 32 (mEq/L)   Glucose, Bld 96  70 - 99 (mg/dL)   BUN 13  6 - 23 (mg/dL)   Creatinine, Ser 1.47 (*) 0.50 - 1.10 (mg/dL)   Calcium 9.0  8.4 - 82.9 (mg/dL)   Total Protein 6.3  6.0 - 8.3 (g/dL)   Albumin 2.9 (*) 3.5 - 5.2 (g/dL)   AST 14  0 - 37 (U/L)   ALT 11  0 - 35 (U/L)   Alkaline Phosphatase 101  39 - 117 (U/L)   Total Bilirubin 0.3  0.3 - 1.2 (mg/dL)   GFR calc non Af Amer 37 (*) >90 (mL/min)   GFR calc Af Amer 43 (*) >90 (mL/min)  CBC     Status: Abnormal   Collection Time   03/18/11  6:40 AM      Component Value Range   WBC 4.1  4.0 - 10.5 (K/uL)   RBC 4.58  3.87 - 5.11 (MIL/uL)   Hemoglobin 14.0  12.0 -  15.0 (g/dL)   HCT 69.6  29.5 - 28.4 (%)   MCV 90.4  78.0 - 100.0 (fL)   MCH 30.6  26.0 - 34.0 (pg)   MCHC 33.8  30.0 - 36.0 (g/dL)   RDW 13.2  44.0 - 10.2 (%)   Platelets 135 (*) 150 - 400 (K/uL)  LIPASE, BLOOD     Status: Normal   Collection Time   03/18/11  6:40 AM      Component Value Range   Lipase 16  11 - 59 (U/L)  HEMOGLOBIN A1C     Status: Abnormal   Collection Time   03/18/11  6:40 AM      Component Value Range   Hemoglobin A1C 6.3 (*) <5.7 (%)   Mean Plasma Glucose 134 (*) <117 (mg/dL)  GLUCOSE, CAPILLARY     Status:  Abnormal   Collection Time   03/18/11  6:53 AM      Component Value Range   Glucose-Capillary 100 (*) 70 - 99 (mg/dL)  MRSA PCR SCREENING     Status: Normal   Collection Time   03/18/11 11:35 AM      Component Value Range   MRSA by PCR NEGATIVE  NEGATIVE   GLUCOSE, CAPILLARY     Status: Abnormal   Collection Time   03/18/11 11:48 AM      Component Value Range   Glucose-Capillary 108 (*) 70 - 99 (mg/dL)  CLOSTRIDIUM DIFFICILE BY PCR     Status: Normal   Collection Time   03/18/11  4:03 PM      Component Value Range   C difficile by pcr NEGATIVE  NEGATIVE   GLUCOSE, CAPILLARY     Status: Abnormal   Collection Time   03/18/11  4:34 PM      Component Value Range   Glucose-Capillary 107 (*) 70 - 99 (mg/dL)   Comment 1 Documented in Chart     Comment 2 Notify RN     Micro Results: Recent Results (from the past 240 hour(s))  MRSA PCR SCREENING     Status: Normal   Collection Time   03/18/11 11:35 AM      Component Value Range Status Comment   MRSA by PCR NEGATIVE  NEGATIVE  Final   CLOSTRIDIUM DIFFICILE BY PCR     Status: Normal   Collection Time   03/18/11  4:03 PM      Component Value Range Status Comment   C difficile by pcr NEGATIVE  NEGATIVE  Final     Studies/Results: Ct Abdomen Pelvis Wo Contrast  03/18/2011  *RADIOLOGY REPORT*  Clinical Data: Vomiting and diarrhea.  CT ABDOMEN AND PELVIS WITHOUT CONTRAST  Technique:  Multidetector CT imaging of the abdomen and pelvis was performed following the standard protocol without intravenous contrast.  Comparison: 02/28/2011  Findings: The liver, spleen, pancreas, adrenal glands, and kidneys are normal.  Terminal ileum and appendix are normal.  There are scattered diverticula in the descending portion of the colon. Uterus and ovaries are normal.  Lung bases are clear.  Gallbladder has been removed.  Chronic slight prominence of the common bile duct, unchanged.  Moderate osteoarthritis of the right hip.  The prior lumbar fusion at L5-S1.   IMPRESSION: No acute abnormalities of the abdomen or pelvis.  Original Report Authenticated By: Gwynn Burly, M.D.    Medications:  Scheduled Meds:   . sodium chloride   Intravenous STAT  . DULoxetine  60 mg Oral Daily  . gabapentin  300 mg  Oral QID  .  HYDROmorphone (DILAUDID) injection  1 mg Intravenous Once  .  HYDROmorphone (DILAUDID) injection  1 mg Intravenous Once  .  HYDROmorphone (DILAUDID) injection  1 mg Intravenous Once  . hydrOXYzine  25 mg Oral TID  . insulin aspart  0-9 Units Subcutaneous TID WC  . ondansetron  4 mg Intravenous Once  . ondansetron  4 mg Intravenous Once  . pantoprazole  20 mg Oral Q1200  . pneumococcal 23 valent vaccine  0.5 mL Intramuscular Tomorrow-1000  . sodium chloride  1,000 mL Intravenous Once  . sodium chloride  500 mL Intravenous Once  . traZODone  75 mg Oral QHS  . DISCONTD: iohexol  20 mL Oral Q1 Hr x 2   Continuous Infusions:   . sodium chloride 75 mL/hr at 03/18/11 0530   PRN Meds:.acetaminophen, acetaminophen, albuterol, HYDROmorphone, ondansetron (ZOFRAN) IV, ondansetron, traMADol, DISCONTD: ondansetron (ZOFRAN) IV  Assessment/Plan:  #1. Nausea vomiting and diarrhea with abdominal pain - at this time it is nonspecific. CT abdomen and pelvis does not show anything acute. Advance diet as tolerated.  Patient may be having a viral syndrome.  #2. Dehydration with acute renal failure - probably secondary to her nausea vomiting and diarrhea. Recheck metabolic panel hydration. Continue IVF for now.  #3. History of depression and asthma - presently stable, continue home medications.  #4. History of diet-controlled diabetes - check hemoglobin A1c and have placed on sliding scale coverage. #5. H/O peptic ulcer - PPI therapy.    LOS: 1 day   Imya Mance 03/18/2011, 6:27 PM   Cleora Fleet, MD, CDE, FAAFP [facility] [place], [place]  [phone]

## 2011-03-18 NOTE — ED Notes (Signed)
Pt. Resting in bed. Requested head to be lowered. No other needs voiced.

## 2011-03-19 DIAGNOSIS — K2270 Barrett's esophagus without dysplasia: Secondary | ICD-10-CM

## 2011-03-19 DIAGNOSIS — K3184 Gastroparesis: Principal | ICD-10-CM

## 2011-03-19 DIAGNOSIS — K219 Gastro-esophageal reflux disease without esophagitis: Secondary | ICD-10-CM

## 2011-03-19 DIAGNOSIS — R112 Nausea with vomiting, unspecified: Secondary | ICD-10-CM

## 2011-03-19 DIAGNOSIS — R197 Diarrhea, unspecified: Secondary | ICD-10-CM

## 2011-03-19 DIAGNOSIS — R1013 Epigastric pain: Secondary | ICD-10-CM

## 2011-03-19 LAB — CBC
HCT: 38.3 % (ref 36.0–46.0)
Hemoglobin: 12.4 g/dL (ref 12.0–15.0)
MCH: 30 pg (ref 26.0–34.0)
MCHC: 32.4 g/dL (ref 30.0–36.0)
MCV: 92.5 fL (ref 78.0–100.0)
Platelets: 123 10*3/uL — ABNORMAL LOW (ref 150–400)
RBC: 4.14 MIL/uL (ref 3.87–5.11)
RDW: 14.5 % (ref 11.5–15.5)
WBC: 2.7 10*3/uL — ABNORMAL LOW (ref 4.0–10.5)

## 2011-03-19 LAB — COMPREHENSIVE METABOLIC PANEL
ALT: 11 U/L (ref 0–35)
AST: 14 U/L (ref 0–37)
Albumin: 2.7 g/dL — ABNORMAL LOW (ref 3.5–5.2)
Alkaline Phosphatase: 98 U/L (ref 39–117)
BUN: 8 mg/dL (ref 6–23)
CO2: 29 mEq/L (ref 19–32)
Calcium: 8.7 mg/dL (ref 8.4–10.5)
Chloride: 108 mEq/L (ref 96–112)
Creatinine, Ser: 1.33 mg/dL — ABNORMAL HIGH (ref 0.50–1.10)
GFR calc Af Amer: 55 mL/min — ABNORMAL LOW (ref >90)
GFR calc non Af Amer: 47 mL/min — ABNORMAL LOW (ref >90)
Glucose, Bld: 92 mg/dL (ref 70–99)
Potassium: 4 mEq/L (ref 3.5–5.1)
Sodium: 142 mEq/L (ref 135–145)
Total Bilirubin: 0.3 mg/dL (ref 0.3–1.2)
Total Protein: 6 g/dL (ref 6.0–8.3)

## 2011-03-19 LAB — GLUCOSE, CAPILLARY
Glucose-Capillary: 99 mg/dL (ref 70–99)
Glucose-Capillary: 104 mg/dL — ABNORMAL HIGH (ref 70–99)
Glucose-Capillary: 99 mg/dL (ref 70–99)
Glucose-Capillary: 128 mg/dL — ABNORMAL HIGH (ref 70–99)

## 2011-03-19 MED ORDER — HYDROXYZINE HCL 25 MG PO TABS
25.0000 mg | ORAL_TABLET | Freq: Two times a day (BID) | ORAL | Status: DC | PRN
  Administered 2011-03-19 – 2011-03-20 (×2): 25 mg via ORAL
  Filled 2011-03-19 (×2): qty 1

## 2011-03-19 MED ORDER — DIPHENHYDRAMINE HCL 25 MG PO CAPS
50.0000 mg | ORAL_CAPSULE | Freq: Four times a day (QID) | ORAL | Status: DC | PRN
  Administered 2011-03-19: 50 mg via ORAL
  Filled 2011-03-19: qty 2

## 2011-03-19 MED ORDER — DULOXETINE HCL 30 MG PO CPEP
30.0000 mg | ORAL_CAPSULE | Freq: Every day | ORAL | Status: DC
  Administered 2011-03-19: 30 mg via ORAL
  Filled 2011-03-19 (×3): qty 1

## 2011-03-19 MED ORDER — HYDROXYZINE HCL 25 MG PO TABS: 25.0000 mg | ORAL_TABLET | Freq: Three times a day (TID) | ORAL | Status: DC | PRN

## 2011-03-19 MED ORDER — DULOXETINE HCL 60 MG PO CPEP
60.0000 mg | ORAL_CAPSULE | Freq: Every day | ORAL | Status: DC
  Administered 2011-03-20: 60 mg via ORAL
  Filled 2011-03-19: qty 1

## 2011-03-19 MED ORDER — FAMOTIDINE 40 MG/5ML PO SUSR
40.0000 mg | Freq: Every day | ORAL | Status: DC
  Administered 2011-03-19 – 2011-03-20 (×2): 40 mg via ORAL
  Filled 2011-03-19 (×2): qty 5

## 2011-03-19 NOTE — Consult Note (Signed)
[facility] Consult: 10:04 AM 03/19/2011   Referring Provider: Dr.Kakrakandy  Primary Care Physician:  Julieanne Manson, MD, MD Primary Gastroenterologist:  Patient does not have a gastroenterologist  Reason for Consultation:  Persistent, protracted, intermittent nausea, vomiting, diarrhea.  HPI: Michelle [name] is a 46 y.o. female.  Patient is an obese, diet controlled diabetic. Cholecystectomy in 1991. She has a history of peptic ulcer disease in the early 1990s. She doesn't recall much in the way of details leading up to this diagnosis but thinks that her presenting symptom is pain in the epigastrium. She had an outpatient EGD and says this showed an ulcer in the "throat" and stomach. Patient has periodic heartburn, this is mostly controlled with daily famotidine. Patient's biggest issues are musculoskeletal. She has had lumbar and cervical spine surgery, total knee replacement, bilateral carpal tunnel release. She has chronic back pain treated with tramadol. She rarely uses aspirin product or NSAIDs.  Patient developed nausea and bilious emesis along with watery, dark colored diarrhea about 3 to 4  weeks ago. She had pain which initially located in the lower abdomen, pelvis and radiated around to the right abdomen and side. Now this pain has radiated up into the epigastric region where it is most prominent.  These symptoms have been intermittent in that she'll have them for a few days and then have a couple of days worth of symptoms are not present, she is eating and drinking well and feels well. When she states she's had fevers subjectively, chills and diaphoresis. She spent to the emergency room with symptoms.  On February 17th a CAT scan with contrast was performed which was entirely unremarkable.   Comprehensive labs were unremarkable other than a glucose of 105. Her BUN, creatinine and electrolytes were normal.  Lipase was normal.  She was given a  prescription for protonix.  However she discontinued this because it caused her to develop a rash on her arm.  Labs were done again on March 6, the day of her admission and her creatinine had risen to 1.62 but her BUN was normal.   LFTs and lipase again normal. A CT scan, non contrasted, was repeated on 03/18/2011, again it was unremarkable though it does mention some slight prominence of the common bile duct which was stable as well as scattered diverticulosis in the descending colon.  Patient had recurrent GI symptoms on March 4, again per the usual she was able to keep down clear liquids but nothing in terms of solid food. When she takes this by mouth since she tends to have rapid limitation of loose, nonbloody stool. The epigastric discomfort was worsening so she came to the emergency room. She continues to tolerate clear liquids but nothing else and diarrhea continues.   Past Medical History  Diagnosis Date  . Asthma   . GERD (gastroesophageal reflux disease)   . Peptic ulcer disease 1984  . Episodic tension type headache   . Arthritis     hips, knees and spine  . Anxiety   . Depression   . Carpal tunnel syndrome of left wrist     Past Surgical History  Procedure Date  . Cervical fusion 2000  . Joint replacement 2009    left knee  . Back surgery 2008    lumbar  fusion  . Cholecystectomy 1991  . Carpal tunnel release 2011    right hand  . Carpal tunnel release 11/23/2010    Procedure: CARPAL TUNNEL RELEASE;  Surgeon: Cammy Copa;  Location:  [facility] OR;  Service: Orthopedics;  Laterality: Left;  revision left carpal tunnel release neurolysis of median nerve    Prior to Admission medications   Medication Sig Start Date End Date Taking? Authorizing Provider  albuterol (PROVENTIL HFA;VENTOLIN HFA) 108 (90 BASE) MCG/ACT inhaler Inhale 2 puffs into the lungs 2 (two) times daily as needed. FOR WHEEZING     Yes Historical Provider, MD  DULoxetine (CYMBALTA) 60 MG capsule Take 60 mg  by mouth daily.     Yes Historical Provider, MD  famotidine (PEPCID) 40 MG tablet Take 40 mg by mouth at bedtime as needed. For acid reflux    Yes Historical Provider, MD  furosemide (LASIX) 20 MG tablet Take 20 mg by mouth daily.     Yes Historical Provider, MD  gabapentin (NEURONTIN) 300 MG capsule Take 300 mg by mouth 4 (four) times daily.     Yes Historical Provider, MD  hydrOXYzine (ATARAX/VISTARIL) 25 MG tablet Take 25 mg by mouth 3 (three) times daily.     Yes Historical Provider, MD  potassium chloride (KLOR-CON) 10 MEQ CR tablet Take 10 mEq by mouth daily.     Yes Historical Provider, MD  traMADol (ULTRAM) 50 MG tablet Take 50-100 mg by mouth every 6 (six) hours as needed. For pain   Yes Historical Provider, MD  traZODone (DESYREL) 50 MG tablet Take 75 mg by mouth at bedtime.    Yes Historical Provider, MD    Scheduled Meds:    . sodium chloride   Intravenous STAT  . DULoxetine  60 mg Oral Daily  . gabapentin  300 mg Oral QID  . hydrOXYzine  25 mg Oral TID  . insulin aspart  0-9 Units Subcutaneous TID WC  . pantoprazole  20 mg Oral Q1200  . pneumococcal 23 valent vaccine  0.5 mL Intramuscular Tomorrow-1000  . traZODone  75 mg Oral QHS   Infusions:    . sodium chloride 75 mL/hr at 03/18/11 0530   PRN Meds: acetaminophen, acetaminophen, albuterol, HYDROmorphone, ondansetron (ZOFRAN) IV, ondansetron, traMADol   Allergies as of 03/17/2011 - Review Complete 03/17/2011  Allergen Reaction Noted  . Codeine Itching   . Penicillins Itching and Nausea And Vomiting 04/08/2009    Family History  Problem Relation Age of Onset  . Esophageal cancer Father     History   Social History  . Marital Status: Single    Spouse Name: N/A    Number of Children: N/A  . Years of Education: N/A   Occupational History  . Not on file.   Social History Main Topics  . Smoking status: Current Everyday Smoker -- 0.5 packs/day for 30 years    Types: Cigarettes  . Smokeless tobacco: Never  Used  . Alcohol Use: No  . Drug Use: No  . Sexually Active: Not Currently    Birth Control/ Protection: Surgical   Other Topics Concern  . Not on file   Social History Narrative  . No narrative on file    REVIEW OF SYSTEMS: Constitutional:  Stable weight ENT:  No sinus congestion or epistaxis Pulm:  Has nonproductive clear or frothy cough which is chronic. She smokes 10 cigarettes daily CV:  No palpitations, chest pain or pressure. If she stands on her feet for a long period of time she will get swelling in her ankles and feet. GU:  No dysuria, hematuria. Had some oliguria coming into the hospitalization but this has resolved with the initiation of IV fluids. GI:  No dysphagia.  Has not seen any blood in the emesis or stools. Heme:  No history of anemia or requirements for supplemental iron.    Transfusions:  none Neuro:  Occasional headaches but she hasn't had one in quite a while. 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 She developed a rash on her arms when she took protonic. No nonhealing sores. Endocrine:  Patient has never required medication for her diabetes. Generally sugars run from about 117 to the 130s. However since she's been ill the sugars have been dipping into the 90s. Immunization:  The patient hasn't received flu shot, she doesn't normally participate in the vaccination.  pnumovax given today.  Travel:  none   PHYSICAL EXAM: Vital signs in last 24 hours: Temp:  [97.4 F (36.3 C)-98.7 F (37.1 C)] 97.7 F (36.5 C) (03/08 0521) Pulse Rate:  [72-73] 72  (03/08 0521) Resp:  [18-20] 20  (03/08 0521) BP: (110-125)/(67-77) 117/67 mmHg (03/08 0521) SpO2:  [93 %-94 %] 94 % (03/08 0521) BMI  44.3   Weight  291#  131.9 kg  General: patient is a morbidly obese, chronically unwell appearing white female. Head:  No signs of trauma.  Eyes:  No icterus, no conjunctival pallor.  EOMI Ears:  No hearing aid in place. No gross hearing deficits  Nose:  No  discharge, no sinus congestion Mouth:  Almost all of the teeth are gone. Maybe 4 teeth remain. Oral mucosa slightly dry but clear. Neck:  No thyromegaly, no JVD, no bruits. Lungs:  Clear to auscultation and percussion bilaterally. Breath sounds slightly reduced this may be from poor inspiratory effort Heart: regular rate rhythm, no murmurs rubs or gallops. S1 and S2 audible Abdomen:  Obese, soft, mild epigastric tenderness without guarding or rebound. No hepatosplenomegaly. No masses. Rectal: patient had a rectal with exam yesterday stool was sampled at bedside and was heme-negative. Musc/Skeltl: no joint deformities. Scars on the neck, knee, wrist and spine consistent with prior surgeries Extremities:  No pedal edema  Neurologic:  Alert and oriented x3. No tremor. No gross deficits Skin:  Psoriatic lesions on left elbow. Small pimple like lesions within the folds of abdominal skin. Tattoos:  None seen Nodes:  None at neck   Psych:  Patient seems depressed with flat affect. She is a worried look on her face. She is pleasant and cooperative.  Intake/Output from previous day: 03/07 0701 - 03/08 0700 In: 2780 [P.O.:980; I.V.:1800] Out: 1 [Stool:1] Intake/Output this shift:    LAB RESULTS:  Basename 03/19/11 0545 03/18/11 0640 03/17/11 1924  WBC 2.7* 4.1 6.5  HGB 12.4 14.0 16.2*  HCT 38.3 41.4 45.6  PLT 123* 135* 175   BMET Lab Results  Component Value Date   NA 142 03/19/2011   NA 136 03/18/2011   NA 134* 03/17/2011   K 4.0 03/19/2011   K 3.4* 03/18/2011   K 3.9 03/17/2011   CL 108 03/19/2011   CL 100 03/18/2011   CL 97 03/17/2011   CO2 29 03/19/2011   CO2 27 03/18/2011   CO2 27 03/17/2011   GLUCOSE 92 03/19/2011   GLUCOSE 96 03/18/2011   GLUCOSE 105* 03/17/2011   BUN 8 03/19/2011   BUN 13 03/18/2011   BUN 12 03/17/2011   CREATININE 1.33* 03/19/2011   CREATININE 1.61* 03/18/2011   CREATININE 1.72* 03/17/2011   CALCIUM 8.7 03/19/2011   CALCIUM 9.0 03/18/2011   CALCIUM 9.9 03/17/2011   LFT  Basename 03/19/11  0545 03/18/11 0640 03/17/11 1924  PROT  6.0 6.3 7.3  ALBUMIN 2.7* 2.9* 3.4*  AST 14 14 18   ALT 11 11 14   ALKPHOS 98 101 110  BILITOT 0.3 0.3 0.4  BILIDIR -- -- --  IBILI -- -- --  lipase                                16                  21   PT/INR Lab Results  Component Value Date   INR 0.91 11/27/2009   INR 0.98 08/12/2009   INR 2.1* 12/01/2007   C-Diff Negative on 03/18/2011  Drugs of Abuse     Component Value Date/Time   LABOPIA NEG 11/21/2008 2128   COCAINSCRNUR NEG 11/21/2008 2128   LABBENZ NEG 11/21/2008 2128   AMPHETMU NEG 11/21/2008 2128     RADIOLOGY STUDIES: Ct Abdomen Pelvis Wo Contrast  03/18/2011  *RADIOLOGY REPORT*  Clinical Data: Vomiting and diarrhea.  CT ABDOMEN AND PELVIS WITHOUT CONTRAST  Technique:  Multidetector CT imaging of the abdomen and pelvis was performed following the standard protocol without intravenous contrast.  Comparison: 02/28/2011  Findings: The liver, spleen, pancreas, adrenal glands, and kidneys are normal.  Terminal ileum and appendix are normal.  There are scattered diverticula in the descending portion of the colon. Uterus and ovaries are normal.  Lung bases are clear.  Gallbladder has been removed.  Chronic slight prominence of the common bile duct, unchanged.  Moderate osteoarthritis of the right hip.  The prior lumbar fusion at L5-S1.  IMPRESSION: No acute abnormalities of the abdomen or pelvis.  Original Report Authenticated By: Gwynn Burly, M.D.    ENDOSCOPIC STUDIES: Remote colonoscopy and EGD, in the 1990's.  No records of this is Epic.    IMPRESSION: 1.  Prolonged intermittent episodes of nausea, vomiting and diarrhea. Rule out protracted viral gastroenteritis. Rule out underlying diabetic gastroparesis. Rule out peptic ulcer disease.  Interestingly her white count, which was never elevated, is now below normal range 2.  Non-insulin dependent diabetic. Requires no medications at home. 3.  Remote history of peptic ulcer  disease, unable to find actual reports documenting this. Reflux symptoms at home generally well controlled with famotidine. 4. Status post cholecystectomy. Suspect the slight common duct prominence seen on CT scan is normal sequela of cholecystectomy. 5.  Diverticulosis seen on CAT scan. No evidence for diverticulitis. 6.  Hypokalemia, needs further supplementation 7.  Morbid obesity  PLAN: 1. Per Dr. Marina Goodell, may need to perform upper endoscopy. For now leave patient on clear liquids.  Patient felt she had developed a rash from Protonix, so we need to be careful with this medication, the current dose is suboptimal. Might want to consider switching her to Pepcid 40 mg daily.   LOS: 2 days   Jennye Moccasin  03/19/2011, 10:04 AM Pager: [phone]  Addendum:  3:30 PM, EGD planned for tomorrow.    GI ATTENDING  PATIENT SEEN AND EXAMINED. LABS AND X-RAYS REVIEWED. AGREE WITH H&P AS OUTLINED ABOVE. RECURRENT N,V,D. MAY BE POST INFECTIOUS GI DYSMOTILITY. PLAN EGD TOMORROW TO R/O OTHER CAUSES, SUCH AS PUD (PRIOR HX OF THE SAME.The nature of the procedure, as well as the risks, benefits, and alternatives were carefully and thoroughly reviewed with the patient. Ample time for discussion and questions allowed. The patient understood, was satisfied, and agreed to proceed.   Wilhemina Bonito. Eda Keys., M.D. [facility]  of Gastroenterology

## 2011-03-19 NOTE — Progress Notes (Signed)
[facility] Inpatient Progress Note  03/19/2011  Subjective: Pt tolerating clears now.  Pt asking to go outside to smoke.  Pt says that the abdominal pain is improving.  No emesis and nausea starting to get better.   Objective:  Vital signs in last 24 hours: Filed Vitals:   03/18/11 1342 03/18/11 2101 03/19/11 0521 03/19/11 1350  BP: 125/77 110/67 117/67 100/66  Pulse: 72 73 72 74  Temp: 98.7 F (37.1 C) 97.4 F (36.3 C) 97.7 F (36.5 C) 97.9 F (36.6 C)  TempSrc: Oral Oral Oral Oral  Resp: 18 19 20 18   Height:      Weight:      SpO2: 94% 93% 94% 96%   Weight change:   Intake/Output Summary (Last 24 hours) at 03/19/11 1527 Last data filed at 03/19/11 1300  Gross per 24 hour  Intake   2800 ml  Output      2 ml  Net   2798 ml    Review of Systems Pertinent items are noted in HPI.   Physical Exam Constitutional: She is oriented to person, place, and time. She appears well-developed and well-nourished. No distress.  HENT:  Head: Normocephalic and atraumatic.  Right Ear: External ear normal.  Left Ear: External ear normal.  Nose: Nose normal.  Mouth/Throat: Oropharynx is clear and moist. No oropharyngeal exudate.  Eyes: Conjunctivae are normal. Pupils are equal, round, and reactive to light. Right eye exhibits no discharge. Left eye exhibits no discharge. No scleral icterus.  Neck: Normal range of motion. Neck supple.  Cardiovascular: Normal rate, regular rhythm and normal heart sounds.  Respiratory: Effort normal and breath sounds normal. No respiratory distress. She has no wheezes. She has no rales.  GI: Soft. Bowel sounds are normal. She exhibits no distension. There is mild epigastric and periumbilical tenderness. There is no rebound ttp.  Musculoskeletal: Normal range of motion. She exhibits no edema and no tenderness.  Neurological: She is alert and oriented to person, place, and time.  Moves all extremities 5/5.  Skin: She is not diaphoretic   Lab  Results: Results for orders placed during the hospital encounter of 03/17/11 (from the past 24 hour(s))  CLOSTRIDIUM DIFFICILE BY PCR     Status: Normal   Collection Time   03/18/11  4:03 PM      Component Value Range   C difficile by pcr NEGATIVE  NEGATIVE   GLUCOSE, CAPILLARY     Status: Abnormal   Collection Time   03/18/11  4:34 PM      Component Value Range   Glucose-Capillary 107 (*) 70 - 99 (mg/dL)   Comment 1 Documented in Chart     Comment 2 Notify RN    GLUCOSE, CAPILLARY     Status: Abnormal   Collection Time   03/18/11  9:57 PM      Component Value Range   Glucose-Capillary 121 (*) 70 - 99 (mg/dL)   Comment 1 Documented in Chart     Comment 2 Notify RN    CBC     Status: Abnormal   Collection Time   03/19/11  5:45 AM      Component Value Range   WBC 2.7 (*) 4.0 - 10.5 (K/uL)   RBC 4.14  3.87 - 5.11 (MIL/uL)   Hemoglobin 12.4  12.0 - 15.0 (g/dL)   HCT 16.1  09.6 - 04.5 (%)   MCV 92.5  78.0 - 100.0 (fL)   MCH 30.0  26.0 - 34.0 (pg)  MCHC 32.4  30.0 - 36.0 (g/dL)   RDW 16.1  09.6 - 04.5 (%)   Platelets 123 (*) 150 - 400 (K/uL)  COMPREHENSIVE METABOLIC PANEL     Status: Abnormal   Collection Time   03/19/11  5:45 AM      Component Value Range   Sodium 142  135 - 145 (mEq/L)   Potassium 4.0  3.5 - 5.1 (mEq/L)   Chloride 108  96 - 112 (mEq/L)   CO2 29  19 - 32 (mEq/L)   Glucose, Bld 92  70 - 99 (mg/dL)   BUN 8  6 - 23 (mg/dL)   Creatinine, Ser 4.09 (*) 0.50 - 1.10 (mg/dL)   Calcium 8.7  8.4 - 81.1 (mg/dL)   Total Protein 6.0  6.0 - 8.3 (g/dL)   Albumin 2.7 (*) 3.5 - 5.2 (g/dL)   AST 14  0 - 37 (U/L)   ALT 11  0 - 35 (U/L)   Alkaline Phosphatase 98  39 - 117 (U/L)   Total Bilirubin 0.3  0.3 - 1.2 (mg/dL)   GFR calc non Af Amer 47 (*) >90 (mL/min)   GFR calc Af Amer 55 (*) >90 (mL/min)  GLUCOSE, CAPILLARY     Status: Normal   Collection Time   03/19/11  7:19 AM      Component Value Range   Glucose-Capillary 99  70 - 99 (mg/dL)  GLUCOSE, CAPILLARY     Status:  Abnormal   Collection Time   03/19/11 11:36 AM      Component Value Range   Glucose-Capillary 104 (*) 70 - 99 (mg/dL)    Micro Results: Recent Results (from the past 240 hour(s))  MRSA PCR SCREENING     Status: Normal   Collection Time   03/18/11 11:35 AM      Component Value Range Status Comment   MRSA by PCR NEGATIVE  NEGATIVE  Final   CLOSTRIDIUM DIFFICILE BY PCR     Status: Normal   Collection Time   03/18/11  4:03 PM      Component Value Range Status Comment   C difficile by pcr NEGATIVE  NEGATIVE  Final     Studies/Results: Ct Abdomen Pelvis Wo Contrast  03/18/2011  *RADIOLOGY REPORT*  Clinical Data: Vomiting and diarrhea.  CT ABDOMEN AND PELVIS WITHOUT CONTRAST  Technique:  Multidetector CT imaging of the abdomen and pelvis was performed following the standard protocol without intravenous contrast.  Comparison: 02/28/2011  Findings: The liver, spleen, pancreas, adrenal glands, and kidneys are normal.  Terminal ileum and appendix are normal.  There are scattered diverticula in the descending portion of the colon. Uterus and ovaries are normal.  Lung bases are clear.  Gallbladder has been removed.  Chronic slight prominence of the common bile duct, unchanged.  Moderate osteoarthritis of the right hip.  The prior lumbar fusion at L5-S1.  IMPRESSION: No acute abnormalities of the abdomen or pelvis.  Original Report Authenticated By: Gwynn Burly, M.D.    Medications:  Scheduled Meds:   . DULoxetine  60 mg Oral Daily  . famotidine  40 mg Oral Daily  . gabapentin  300 mg Oral QID  . hydrOXYzine  25 mg Oral TID  . insulin aspart  0-9 Units Subcutaneous TID WC  . pneumococcal 23 valent vaccine  0.5 mL Intramuscular Tomorrow-1000  . traZODone  75 mg Oral QHS  . DISCONTD: pantoprazole  20 mg Oral Q1200   Continuous Infusions:   . sodium chloride 75 mL/hr  at 03/18/11 0530   PRN Meds:.acetaminophen, acetaminophen, albuterol, HYDROmorphone, ondansetron (ZOFRAN) IV, ondansetron,  traMADol  Assessment/Plan: #1. Nausea vomiting and diarrhea with abdominal pain - at this time it is nonspecific. I suspect she has picked up a viral illness.  CT abdomen and pelvis does not show anything acute. Advance diet as tolerated. EGD per GI recommendations.  #2. Dehydration with acute renal failure - probably secondary to her nausea vomiting and diarrhea. Recheck metabolic panel hydration. Continue IVF for now.   #3. History of depression and asthma - presently stable, continue home medications.   #4. History of diet-controlled diabetes - check hemoglobin A1c and have placed on sliding scale coverage.   #5. H/O peptic ulcer - pepcid 40 mg per GI consult.     LOS: 2 days   Twania Bujak 03/19/2011, 3:27 PM   Cleora Fleet, MD, CDE, FAAFP [facility] [place], [place]  [phone]

## 2011-03-19 NOTE — Progress Notes (Signed)
Utilization review complete 

## 2011-03-20 ENCOUNTER — Encounter (HOSPITAL_COMMUNITY)
Admission: EM | Disposition: A | Discharge status: Home / Self Care | Payer: Self-pay | Source: Ambulatory Visit | Attending: Family Medicine

## 2011-03-20 ENCOUNTER — Encounter (HOSPITAL_COMMUNITY): Payer: Self-pay | Admitting: *Deleted

## 2011-03-20 DIAGNOSIS — K3184 Gastroparesis: Principal | ICD-10-CM | POA: Diagnosis present

## 2011-03-20 DIAGNOSIS — K219 Gastro-esophageal reflux disease without esophagitis: Secondary | ICD-10-CM | POA: Diagnosis present

## 2011-03-20 DIAGNOSIS — K2270 Barrett's esophagus without dysplasia: Secondary | ICD-10-CM | POA: Diagnosis present

## 2011-03-20 HISTORY — PX: ESOPHAGOGASTRODUODENOSCOPY: SHX5428

## 2011-03-20 LAB — BASIC METABOLIC PANEL
BUN: 7 mg/dL (ref 6–23)
CO2: 29 mEq/L (ref 19–32)
Calcium: 8.1 mg/dL — ABNORMAL LOW (ref 8.4–10.5)
Chloride: 105 mEq/L (ref 96–112)
Creatinine, Ser: 1.24 mg/dL — ABNORMAL HIGH (ref 0.50–1.10)
GFR calc Af Amer: 59 mL/min — ABNORMAL LOW (ref >90)
GFR calc non Af Amer: 51 mL/min — ABNORMAL LOW (ref >90)
Glucose, Bld: 98 mg/dL (ref 70–99)
Potassium: 3.6 mEq/L (ref 3.5–5.1)
Sodium: 141 mEq/L (ref 135–145)

## 2011-03-20 LAB — CBC
HCT: 37.1 % (ref 36.0–46.0)
Hemoglobin: 12.3 g/dL (ref 12.0–15.0)
MCH: 30.4 pg (ref 26.0–34.0)
MCHC: 33.2 g/dL (ref 30.0–36.0)
MCV: 91.6 fL (ref 78.0–100.0)
Platelets: 122 10*3/uL — ABNORMAL LOW (ref 150–400)
RBC: 4.05 MIL/uL (ref 3.87–5.11)
RDW: 14.5 % (ref 11.5–15.5)
WBC: 4.5 10*3/uL (ref 4.0–10.5)

## 2011-03-20 LAB — GLUCOSE, CAPILLARY
Glucose-Capillary: 118 mg/dL — ABNORMAL HIGH (ref 70–99)
Glucose-Capillary: 130 mg/dL — ABNORMAL HIGH (ref 70–99)

## 2011-03-20 SURGERY — EGD (ESOPHAGOGASTRODUODENOSCOPY)
Anesthesia: Moderate Sedation

## 2011-03-20 MED ORDER — TRAMADOL HCL 50 MG PO TABS: 50.0000 mg | ORAL_TABLET | Freq: Four times a day (QID) | ORAL | Status: AC | PRN

## 2011-03-20 MED ORDER — DIPHENHYDRAMINE HCL 50 MG PO CAPS: 50.0000 mg | ORAL_CAPSULE | Freq: Four times a day (QID) | ORAL | Status: DC | PRN

## 2011-03-20 MED ORDER — DIPHENHYDRAMINE HCL 50 MG/ML IJ SOLN
INTRAMUSCULAR | Status: DC | PRN
  Administered 2011-03-20: 25 mg via INTRAVENOUS

## 2011-03-20 MED ORDER — FENTANYL NICU IV SYRINGE 50 MCG/ML
INJECTION | INTRAMUSCULAR | Status: DC | PRN
  Administered 2011-03-20 (×3): 25 ug via INTRAVENOUS

## 2011-03-20 MED ORDER — PROMETHAZINE HCL 12.5 MG PO TABS: 12.5000 mg | ORAL_TABLET | Freq: Four times a day (QID) | ORAL | Status: DC | PRN

## 2011-03-20 MED ORDER — OMEPRAZOLE 40 MG PO CPDR: 40.0000 mg | DELAYED_RELEASE_CAPSULE | Freq: Two times a day (BID) | ORAL | Status: DC

## 2011-03-20 MED ORDER — MIDAZOLAM HCL 10 MG/2ML IJ SOLN
INTRAMUSCULAR | Status: AC
  Filled 2011-03-20: qty 2

## 2011-03-20 MED ORDER — DIPHENHYDRAMINE HCL 50 MG/ML IJ SOLN
INTRAMUSCULAR | Status: AC
  Filled 2011-03-20: qty 1

## 2011-03-20 MED ORDER — FENTANYL CITRATE 0.05 MG/ML IJ SOLN
INTRAMUSCULAR | Status: AC
  Filled 2011-03-20: qty 2

## 2011-03-20 MED ORDER — MIDAZOLAM HCL 10 MG/2ML IJ SOLN
INTRAMUSCULAR | Status: DC | PRN
  Administered 2011-03-20 (×2): 2.5 mg via INTRAVENOUS
  Administered 2011-03-20: 2 mg via INTRAVENOUS

## 2011-03-20 NOTE — Discharge Instructions (Signed)
Barrett's Esophagus The esophagus is the muscular tube that carries food and saliva from the mouth to the stomach. Barrett's esophagus involves changes in the esophagus. Some of its lining is replaced by a type of tissue similar to that found in the intestine. This process is called intestinal metaplasia. While Barrett's esophagus may cause no symptoms itself, a small number of people with this condition develop a relatively rare but often deadly type of cancer of the esophagus. It is called esophageal adenocarcinoma. Barrett's esophagus is associated with the common condition called GERD (gastroesophageal reflux disease). HOW THE ESOPHAGUS WORKS The esophagus carries food, liquids, and saliva from the mouth to the stomach. The stomach acts as a container to start digestion and pump food and liquids into the intestines in a controlled process. Food can then be properly digested over time. Nutrients can be taken in (absorbed) by the intestines. The esophagus moves food to the stomach by coordinated contractions of its muscular lining. This process is automatic. People are usually not aware of it. Many people have felt their esophagus when they:  Swallow something too large.   Try to eat too quickly.   Drink very hot or cold liquids.  They then feel the movement of the food or drink down the esophagus into the stomach. This may be an uncomfortable feeling. DIGESTIVE TRACT The muscular layers of the esophagus are normally pinched together at both the upper and lower ends by muscles. These muscles are called sphincters. When a person swallows, the sphincters relax automatically. This allows food or drink to pass from the mouth, into the stomach. The muscles then close rapidly. This prevents the swallowed food or drink from leaking out of the stomach, back into the esophagus or into the mouth. These muscles make it possible to swallow while lying down or even upside-down. When people belch to release  swallowed air or gas from carbonated beverages, the sphincters relax. Then small amounts of food or drink may come back up, briefly. This condition is called reflux. The esophagus quickly squeezes the material back into the stomach. This is considered normal. These functions of the esophagus are an important part of everyday life. However, people who must have their esophagus removed, for example because of cancer, can live a relatively healthy life without it. GERD (GASTROESOPHAGEAL REFLUX DISEASE) Having some stomach contents (liquids or gas) sometimes reflux (come back up from the stomach into the esophagus) is considered normal. When it happens often, and causes other symptoms, it is considered a medical problem or disease. However, it is not necessarily a serious one or one that requires seeing a caregiver. The stomach produces acid and enzymes to digest food. When this mixture refluxes into the esophagus more often than normal or for a longer period of time than normal, it may produce symptoms. These symptoms are often called acid reflux. They are often described by people as heartburn, indigestion, or "gas". The symptoms often consist of a burning sensation below and behind the lower part of the breastbone or sternum. Almost everyone has experienced these symptoms at least once. This is typically a result of overeating. Other things that provoke GERD symptoms include:  Being overweight.   Eating certain types of foods.   Being pregnant.  In most people, GERD symptoms may last only a short time and require no treatment at all.  More continual symptoms are often quickly relieved by over-the-counter acid-reducing agents, such as antacids. Other drugs used to relieve GERD symptoms are antisecretory drugs,  such as histamine2 (H2) blockers or proton pump inhibitors. People who have symptoms often should talk with their caregiver. Other diseases can have similar symptoms. Prescription medicines,  together with other actions, might be needed to reduce reflux. GERD that is untreated over a long period can lead to problems. An example is an ulcer in the esophagus, that could cause bleeding. Another common problem is scar tissue that blocks the movement of swallowed food and drink through the esophagus. This condition is called stricture. Esophageal reflux may also cause certain less common symptoms. These include hoarseness or chronic cough. It sometimes provokes conditions such as asthma. While most patients find that lifestyle changes and acid-blocking drugs relieve their symptoms, caregivers sometimes advise surgery. Overall, GERD is one of the most common medical conditions. About 20 percent of the population can be affected over a lifetime. GERD AND BARRETT'S ESOPHAGUS The exact causes of Barrett's esophagus are not known. It is thought to be caused in part by the same factors that cause GERD. People who do not have heartburn can have Barrett's esophagus. However, it is found about 3 to 5 times more often in people with this condition. Barrett's esophagus is uncommon in children. The average age at diagnosis is 12. But it is usually difficult to know when the problem started. It is about twice as common in men as in women. It is much more common in white men than in men of other races. BARRETT'S ESOPHAGUS AND CANCER OF THE ESOPHAGUS Barrett's esophagus does not cause symptoms itself. However, it seems to precede the development of a particular kind of cancer. This cancer is esophageal adenocarcinoma. The risk of developing this cancer is 30 to 125 times higher in people who have Barrett's esophagus than in people who do not. This type of cancer is increasing quickly in white men. The increase is possibly related to the rise in obesity and GERD. For people who have Barrett's esophagus, the risk of getting cancer of the esophagus is small. It is less than 1 percent (0.4% to 0.5%) per year. Esophageal  adenocarcinoma is often not curable. This is partly because the disease is often discovered at a late stage and treatments are not effective. DIAGNOSIS Diagnosing Barrett's esophagus is not easy. At the present time it cannot be diagnosed based on symptoms, physical exam, or blood tests. The only useful test is upper gastrointestinal endoscopy and biopsy. In this procedure, a flexible tube called an endoscope is used. This tool is like a pencil sized flexible telescope. It has a light and tiny camera. It is passed into the esophagus. If the tissue appears suspicious to your caregiver, biopsies must be done. A biopsy is the removal of a small piece of tissue. This is done using a pincher-like device passed through the endoscope. A pathologist is a specialist who examines body tissue samples. He or she examines the tissue under a microscope to confirm the diagnosis. Looking for a medical problem in people who do not know whether they have one is called screening. Currently, there are no commonly accepted guidelines on who should have endoscopy, to check for Barrett's esophagus. There are many reasons for the lack of firm recommendations about screening. Among them are the great cost and occasional risk of side effects of the test. Also, the rate of finding Barrett's esophagus is low. Finding the problem early has not been proven to prevent deaths from cancer. Many caregivers advise that adult patients who are over the age of 27 and  have had GERD symptoms for a number of years have endoscopy, to see whether they have Barrett's esophagus. Screening for this condition in people who have no symptoms is not advised. TREATMENT  Barrett's esophagus has no cure, other than surgical removal of the esophagus. This is a serious operation. Surgery is advised only for people who have a high risk of developing cancer or who already have it. Most caregivers recommend treating GERD with acid-blocking drugs. This is sometimes  linked to improvement in the extent of the Barrett's tissue. But this approach has not been proven to reduce the risk of cancer. Treating reflux with surgery for GERD also does not seem to cure Barrett's esophagus. Several experimental approaches are under study. One attempts to see whether destroying the Barrett's tissue by heat or other means, through an endoscope, can get rid of the condition. But this approach has potential risks and unknown effectiveness. LOOKING FOR DYSPLASIA AND CANCER Occasional endoscopic examinations to look for early warning signs of cancer are generally advised for people who have Barrett's esophagus. This approach is called surveillance. When people who have Barrett's esophagus develop cancer, the process seems to go through an intermediate stage. In this stage cancer cells appear in the Barrett's tissue. This condition is called dysplasia. It can be seen only in biopsies with a microscope. The process is patchy and cannot be seen directly through the endoscope. So, multiple biopsies must be taken. Even then, it can be missed. The process of change from Barrett's to cancer seems to happen only in a few patients. It is less than 1 percent per year. And it happens over a relatively long period of time. Most caregivers advise that patients with Barrett's esophagus undergo occasional endoscopy to have biopsies. The recommended time between endoscopies varies depending on circumstances. The best time interval has not been decided. TREATMENT FOR DYSPLASIA OR ESOPHAGEAL ADENOCARCINOMA If a person with Barrett's esophagus is found to have dysplasia or cancer, the caregiver will usually recommend surgery. This is if the person is strong enough and has a good chance of being cured. The type of surgery may vary. But it usually involves removing most of the esophagus and pulling the stomach up into the chest to attach it to what remains of the esophagus. Many patients with Barrett's esophagus  are elderly. They may have many other medical problems that make surgery unwise. In these patients, other methods to treat dysplasia are being studied. IMPORTANT POINTS TO REMEMBER  In Barrett's esophagus, the cells lining the esophagus change. They become similar to the cells lining the intestine.   Barrett's esophagus is connected with gastroesophageal reflux disease or GERD.   A small number of people with Barrett's esophagus may develop esophageal cancer.   Barrett's esophagus is diagnosed by upper gastrointestinal endoscopy and biopsy.   People who have Barrett's esophagus should have periodic esophageal exams.   Taking acid-blocking drugs for GERD may help improve Barrett's esophagus.   Removal of the esophagus is recommended only for people who have a high risk of developing cancer or who already have it.  FOR MORE INFORMATION International Foundation for Functional Gastrointestinal Disorders (IFFGD): www.iffgd.org Document Released: 03/20/2003 Document Revised: 12/17/2010 Document Reviewed: 12/25/2007 [facility] Patient Information 2012 [place], [place].  Diet for GERD or PUD Nutrition therapy can help ease the discomfort of gastroesophageal reflux disease (GERD) and peptic ulcer disease (PUD).  HOME CARE INSTRUCTIONS   Eat your meals slowly, in a relaxed setting.   Eat 5 to 6 small  meals per day.   If a food causes distress, stop eating it for a period of time.  FOODS TO AVOID  Coffee, regular or decaffeinated.   Cola beverages, regular or low calorie.   Tea, regular or decaffeinated.   Pepper.   Cocoa.   High fat foods, including meats.   Butter, margarine, hydrogenated oil (trans fats).   Peppermint or spearmint (if you have GERD).   Fruits and vegetables if not tolerated.   Alcohol.   Nicotine (smoking or chewing). This is one of the most potent stimulants to acid production in the gastrointestinal tract.   Any food that seems to aggravate your  condition.  If you have questions regarding your diet, ask your caregiver or a registered dietitian. TIPS  Lying flat may make symptoms worse. Keep the head of your bed raised 6 to 9 inches (15 to 23 cm) by using a foam wedge or blocks under the legs of the bed.   Do not lay down until 3 hours after eating a meal.   Daily physical activity may help reduce symptoms.  MAKE SURE YOU:   Understand these instructions.   Will watch your condition.   Will get help right away if you are not doing well or get worse.  Document Released: 12/28/2004 Document Revised: 12/17/2010 Document Reviewed: 11/13/2010 [facility] Patient Information 2012 [place], [place].  Gastroparesis  Gastroparesis is also called slowed stomach emptying (delayed gastric emptying). It is a condition in which the stomach takes too long to empty its contents. It often happens in people with diabetes.  CAUSES  Gastroparesis happens when nerves to the stomach are damaged or stop working. When the nerves are damaged, the muscles of the stomach and intestines do not work normally. The movement of food is slowed or stopped. High blood glucose (sugar) causes changes in nerves and can damage the blood vessels that carry oxygen and nutrients to the nerves. RISK FACTORS  Diabetes.   Post-viral syndromes.   Eating disorders (anorexia, bulimia).   Surgery on the stomach or vagus nerve.   Gastroesophageal reflux disease (rarely).   Smooth muscle disorders (amyloidosis, scleroderma).   Metabolic disorders, including hypothyroidism.   Parkinson's disease.  SYMPTOMS   Heartburn.   Feeling sick to your stomach (nausea).   Vomiting of undigested food.   An early feeling of fullness when eating.   Weight loss.   Abdominal bloating.   Erratic blood glucose levels.   Lack of appetite.   Gastroesophageal reflux.   Spasms of the stomach wall.  Complications can include:  Bacterial overgrowth in stomach. Food stays in  the stomach and can ferment and cause bacteria to grow.   Weight loss due to difficulty digesting and absorbing nutrients.   Vomiting.   Obstruction in the stomach. Undigested food can harden and cause nausea and vomiting.   Blood glucose fluctuations caused by inconsistent food absorption.  DIAGNOSIS  The diagnosis of gastroparesis is confirmed through one or more of the following tests:  Barium X-rays and scans. These tests look at how long it takes for food to move through the stomach.   Gastric manometry. This test measures electrical and muscular activity in the stomach. A thin tube is passed down the throat into the stomach. The tube contains a wire that takes measurements of the stomach's electrical and muscular activity as it digests liquids and solid food.   Endoscopy. This procedure is done with a long, thin tube called an endoscope. It is passed through the  mouth and gently guides down the esophagus into the stomach. This tube helps the caregiver look at the lining of the stomach to check for any abnormalities.   Ultrasound. This can rule out gallbladder disease or pancreatitis. This test will outline and define the shape of the gallbladder and pancreas.  TREATMENT   The primary treatment is to identify the problem and help control blood glucose levels. Treatments include:   Exercise.   Medicines to control nausea and vomiting.   Medicines to stimulate stomach muscles.   Changes in what and when you eat.   Having smaller meals more often.   Eating low-fiber forms of high-fiber foods, such aseating cooked vegetables instead of raw vegetables.   Eating low-fat foods.   Consuming liquids, which are easier to digest.   In severe cases, feeding tubes and intravenous (IV) feeding may be needed.  It is important to note that in most cases, treatment does not cure gastroparesis. It is usually a lasting (chronic) condition. Treatment helps you manage the condition so that  you can be as healthy and comfortable as possible. NEW TREATMENTS  A gastric neurostimulator has been developed to assist people with gastroparesis. The battery-operated device is surgically implanted. It emits mild electrical pulses to help improve stomach emptying and to control nausea and vomiting.   The use of botulinum toxin has been shown to improve stomach emptying by decreasing the prolonged contractions of the muscle between the stomach and the small intestine (pyloric sphincter). The benefits are temporary.  SEEK MEDICAL CARE IF:   You are having problems keeping your blood glucose in goal range.   You are having nausea, vomiting, bloating, or early feelings of fullness with eating.   Your symptoms do not change with a change in diet.  Document Released: 12/28/2004 Document Revised: 12/17/2010 Document Reviewed: 06/06/2008 [facility] Patient Information 2012 [place], [place].  Diabetes, Type 2 Diabetes is a long-lasting (chronic) disease. In type 2 diabetes, the pancreas does not make enough insulin (a hormone), and the body does not respond normally to the insulin that is made. This type of diabetes was also previously called adult-onset diabetes. It usually occurs after the age of 33, but it can occur at any age.  CAUSES  Type 2 diabetes happens because the pancreasis not making enough insulin or your body has trouble using the insulin that your pancreas does make properly. SYMPTOMS   Drinking more than usual.   Urinating more than usual.   Blurred vision.   Dry, itchy skin.   Frequent infections.   Feeling more tired than usual (fatigue).  DIAGNOSIS The diagnosis of type 2 diabetes is usually made by one of the following tests:  Fasting blood glucose test. You will not eat for at least 8 hours and then take a blood test.   Random blood glucose test. Your blood glucose (sugar) is checked at any time of the day regardless of when you ate.   Oral glucose tolerance test  (OGTT). Your blood glucose is measured after you have not eaten (fasted) and then after you drink a glucose containing beverage.  TREATMENT   Healthy eating.   Exercise.   Medicine, if needed.   Monitoring blood glucose.   Seeing your caregiver regularly.  HOME CARE INSTRUCTIONS   Check your blood glucose at least once a day. More frequent monitoring may be necessary, depending on your medicines and on how well your diabetes is controlled. Your caregiver will advise you.   Take your medicine  as directed by your caregiver.   Do not smoke.   Make wise food choices. Ask your caregiver for information. Weight loss can improve your diabetes.   Learn about low blood glucose (hypoglycemia) and how to treat it.   Get your eyes checked regularly.   Have a yearly physical exam. Have your blood pressure checked and your blood and urine tested.   Wear a pendant or bracelet saying that you have diabetes.   Check your feet every night for cuts, sores, blisters, and redness. Let your caregiver know if you have any problems.  SEEK MEDICAL CARE IF:   You have problems keeping your blood glucose in target range.   You have problems with your medicines.   You have symptoms of an illness that do not improve after 24 hours.   You have a sore or wound that is not healing.   You notice a change in vision or a new problem with your vision.   You have a fever.  MAKE SURE YOU:  Understand these instructions.   Will watch your condition.   Will get help right away if you are not doing well or get worse.  Document Released: 12/28/2004 Document Revised: 12/17/2010 Document Reviewed: 06/15/2010  Smoking Cessation This document explains the best ways for you to quit smoking and new treatments to help. It lists new medicines that can double or triple your chances of quitting and quitting for good. It also considers ways to avoid relapses and concerns you may have about quitting, including  weight gain. NICOTINE: A POWERFUL ADDICTION If you have tried to quit smoking, you know how hard it can be. It is hard because nicotine is a very addictive drug. For some people, it can be as addictive as heroin or cocaine. Usually, people make 2 or 3 tries, or more, before finally being able to quit. Each time you try to quit, you can learn about what helps and what hurts. Quitting takes hard work and a lot of effort, but you can quit smoking. QUITTING SMOKING IS ONE OF THE MOST IMPORTANT THINGS YOU WILL EVER DO.  You will live longer, feel better, and live better.   The impact on your body of quitting smoking is felt almost immediately:   Within 20 minutes, blood pressure decreases. Pulse returns to its normal level.   After 8 hours, carbon monoxide levels in the blood return to normal. Oxygen level increases.   After 24 hours, chance of heart attack starts to decrease. Breath, hair, and body stop smelling like smoke.   After 48 hours, damaged nerve endings begin to recover. Sense of taste and smell improve.   After 72 hours, the body is virtually free of nicotine. Bronchial tubes relax and breathing becomes easier.   After 2 to 12 weeks, lungs can hold more air. Exercise becomes easier and circulation improves.   Quitting will reduce your risk of having a heart attack, stroke, cancer, or lung disease:   After 1 year, the risk of coronary heart disease is cut in half.   After 5 years, the risk of stroke falls to the same as a nonsmoker.   After 10 years, the risk of lung cancer is cut in half and the risk of other cancers decreases significantly.   After 15 years, the risk of coronary heart disease drops, usually to the level of a nonsmoker.   If you are pregnant, quitting smoking will improve your chances of having a healthy baby.  The people you live with, especially your children, will be healthier.   You will have extra money to spend on things other than cigarettes.  FIVE  KEYS TO QUITTING Studies have shown that these 5 steps will help you quit smoking and quit for good. You have the best chances of quitting if you use them together: 1. Get ready.  2. Get support and encouragement.  3. Learn new skills and behaviors.  4. Get medicine to reduce your nicotine addiction and use it correctly.  5. Be prepared for relapse or difficult situations. Be determined to continue trying to quit, even if you do not succeed at first.  1. GET READY  Set a quit date.   Change your environment.   Get rid of ALL cigarettes, ashtrays, matches, and lighters in your home, car, and place of work.   Do not let people smoke in your home.   Review your past attempts to quit. Think about what worked and what did not.   Once you quit, do not smoke. NOT EVEN A PUFF!  2. GET SUPPORT AND ENCOURAGEMENT Studies have shown that you have a better chance of being successful if you have help. You can get support in many ways.  Tell your family, friends, and coworkers that you are going to quit and need their support. Ask them not to smoke around you.   Talk to your caregivers (doctor, dentist, nurse, pharmacist, psychologist, and/or smoking counselor).   Get individual, group, or telephone counseling and support. The more counseling you have, the better your chances are of quitting. Programs are available at [employer] and health centers. Call your local health department for information about programs in your area.   Spiritual beliefs and practices may help some smokers quit.   Quit meters are Photographer that keep track of quit statistics, such as amount of "quit-time," cigarettes not smoked, and money saved.   Many smokers find one or more of the many self-help books available useful in helping them quit and stay off tobacco.  3. LEARN NEW SKILLS AND BEHAVIORS  Try to distract yourself from urges to smoke. Talk to someone, go for a walk, or  occupy your time with a task.   When you first try to quit, change your routine. Take a different route to work. Drink tea instead of coffee. Eat breakfast in a different place.   Do something to reduce your stress. Take a hot bath, exercise, or read a book.   Plan something enjoyable to do every day. Reward yourself for not smoking.   Explore interactive web-based programs that specialize in helping you quit.  4. GET MEDICINE AND USE IT CORRECTLY Medicines can help you stop smoking and decrease the urge to smoke. Combining medicine with the above behavioral methods and support can quadruple your chances of successfully quitting smoking. The [place] Food and Drug Administration (FDA) has approved 7 medicines to help you quit smoking. These medicines fall into 3 categories.  Nicotine replacement therapy (delivers nicotine to your body without the negative effects and risks of smoking):   Nicotine gum: Available over-the-counter.   Nicotine lozenges: Available over-the-counter.   Nicotine inhaler: Available by prescription.   Nicotine nasal spray: Available by prescription.   Nicotine skin patches (transdermal): Available by prescription and over-the-counter.   Antidepressant medicine (helps people abstain from smoking, but how this works is unknown):   Bupropion sustained-release (SR) tablets: Available by prescription.   Nicotinic receptor partial  agonist (simulates the effect of nicotine in your brain):   Varenicline tartrate tablets: Available by prescription.   Ask your caregiver for advice about which medicines to use and how to use them. Carefully read the information on the package.   Everyone who is trying to quit may benefit from using a medicine. If you are pregnant or trying to become pregnant, nursing an infant, you are under age 47, or you smoke fewer than 10 cigarettes per day, talk to your caregiver before taking any nicotine replacement medicines.   You should stop  using a nicotine replacement product and call your caregiver if you experience nausea, dizziness, weakness, vomiting, fast or irregular heartbeat, mouth problems with the lozenge or gum, or redness or swelling of the skin around the patch that does not go away.   Do not use any other product containing nicotine while using a nicotine replacement product.   Talk to your caregiver before using these products if you have diabetes, heart disease, asthma, stomach ulcers, you had a recent heart attack, you have high blood pressure that is not controlled with medicine, a history of irregular heartbeat, or you have been prescribed medicine to help you quit smoking.  5. BE PREPARED FOR RELAPSE OR DIFFICULT SITUATIONS  Most relapses occur within the first 3 months after quitting. Do not be discouraged if you start smoking again. Remember, most people try several times before they finally quit.   You may have symptoms of withdrawal because your body is used to nicotine. You may crave cigarettes, be irritable, feel very hungry, cough often, get headaches, or have difficulty concentrating.   The withdrawal symptoms are only temporary. They are strongest when you first quit, but they will go away within 10 to 14 days.  Here are some difficult situations to watch for:  Alcohol. Avoid drinking alcohol. Drinking lowers your chances of successfully quitting.   Caffeine. Try to reduce the amount of caffeine you consume. It also lowers your chances of successfully quitting.   Other smokers. Being around smoking can make you want to smoke. Avoid smokers.   Weight gain. Many smokers will gain weight when they quit, usually less than 10 pounds. Eat a healthy diet and stay active. Do not let weight gain distract you from your main goal, quitting smoking. Some medicines that help you quit smoking may also help delay weight gain. You can always lose the weight gained after you quit.   Bad mood or depression. There are a  lot of ways to improve your mood other than smoking.  If you are having problems with any of these situations, talk to your caregiver. SPECIAL SITUATIONS AND CONDITIONS Studies suggest that everyone can quit smoking. Your situation or condition can give you a special reason to quit.  Pregnant women/new mothers: By quitting, you protect your baby's health and your own.   Hospitalized patients: By quitting, you reduce health problems and help healing.   Heart attack patients: By quitting, you reduce your risk of a second heart attack.   Lung, head, and neck cancer patients: By quitting, you reduce your chance of a second cancer.   Parents of children and adolescents: By quitting, you protect your children from illnesses caused by secondhand smoke.  QUESTIONS TO THINK ABOUT Think about the following questions before you try to stop smoking. You may want to talk about your answers with your caregiver.  Why do you want to quit?   If you tried to quit in the  past, what helped and what did not?   What will be the most difficult situations for you after you quit? How will you plan to handle them?   Who can help you through the tough times? Your family? Friends? Caregiver?   What pleasures do you get from smoking? What ways can you still get pleasure if you quit?  Here are some questions to ask your caregiver:  How can you help me to be successful at quitting?   What medicine do you think would be best for me and how should I take it?   What should I do if I need more help?   What is smoking withdrawal like? How can I get information on withdrawal?  Quitting takes hard work and a lot of effort, but you can quit smoking. FOR MORE INFORMATION  Smokefree.gov (http://www.davis-sullivan.com/) provides free, accurate, evidence-based information and professional assistance to help support the immediate and long-term needs of people trying to quit smoking. Document Released: 12/22/2000 Document  Revised: 12/17/2010 Document Reviewed: 10/14/2008 [facility] Patient Information 2012 [place], [place]. Exit[employer] Patient Information 2012 [place], [place].  Smoking Cessation, Tips for Success YOU CAN QUIT SMOKING If you are ready to quit smoking, congratulations! You have chosen to help yourself be healthier. Cigarettes bring nicotine, tar, carbon monoxide, and other irritants into your body. Your lungs, heart, and blood vessels will be able to work better without these poisons. There are many different ways to quit smoking. Nicotine gum, nicotine patches, a nicotine inhaler, or nicotine nasal spray can help with physical craving. Hypnosis, support groups, and medicines help break the habit of smoking. Here are some tips to help you quit for good.  Throw away all cigarettes.   Clean and remove all ashtrays from your home, work, and car.   On a card, write down your reasons for quitting. Carry the card with you and read it when you get the urge to smoke.   Cleanse your body of nicotine. Drink enough water and fluids to keep your urine clear or pale yellow. Do this after quitting to flush the nicotine from your body.   Learn to predict your moods. Do not let a bad situation be your excuse to have a cigarette. Some situations in your life might tempt you into wanting a cigarette.   Never have "just one" cigarette. It leads to wanting another and another. Remind yourself of your decision to quit.   Change habits associated with smoking. If you smoked while driving or when feeling stressed, try other activities to replace smoking. Stand up when drinking your coffee. Brush your teeth after eating. Sit in a different chair when you read the paper. Avoid alcohol while trying to quit, and try to drink fewer caffeinated beverages. Alcohol and caffeine may urge you to smoke.   Avoid foods and drinks that can trigger a desire to smoke, such as sugary or spicy foods and alcohol.   Ask people who smoke not to  smoke around you.   Have something planned to do right after eating or having a cup of coffee. Take a walk or exercise to perk you up. This will help to keep you from overeating.   Try a relaxation exercise to calm you down and decrease your stress. Remember, you may be tense and nervous for the first 2 weeks after you quit, but this will pass.   Find new activities to keep your hands busy. Play with a pen, coin, or rubber band. Doodle or draw things  on paper.   Brush your teeth right after eating. This will help cut down on the craving for the taste of tobacco after meals. You can try mouthwash, too.   Use oral substitutes, such as lemon drops, carrots, a cinnamon stick, or chewing gum, in place of cigarettes. Keep them handy so they are available when you have the urge to smoke.   When you have the urge to smoke, try deep breathing.   Designate your home as a nonsmoking area.   If you are a heavy smoker, ask your caregiver about a prescription for nicotine chewing gum. It can ease your withdrawal from nicotine.   Reward yourself. Set aside the cigarette money you save and buy yourself something nice.   Look for support from others. Join a support group or smoking cessation program. Ask someone at home or at work to help you with your plan to quit smoking.   Always ask yourself, "Do I need this cigarette or is this just a reflex?" Tell yourself, "Today, I choose not to smoke," or "I do not want to smoke." You are reminding yourself of your decision to quit, even if you do smoke a cigarette.  HOW WILL I FEEL WHEN I QUIT SMOKING?  The benefits of not smoking start within days of quitting.   You may have symptoms of withdrawal because your body is used to nicotine (the addictive substance in cigarettes). You may crave cigarettes, be irritable, feel very hungry, cough often, get headaches, or have difficulty concentrating.   The withdrawal symptoms are only temporary. They are strongest  when you first quit but will go away within 10 to 14 days.   When withdrawal symptoms occur, stay in control. Think about your reasons for quitting. Remind yourself that these are signs that your body is healing and getting used to being without cigarettes.   Remember that withdrawal symptoms are easier to treat than the major diseases that smoking can cause.   Even after the withdrawal is over, expect periodic urges to smoke. However, these cravings are generally short-lived and will go away whether you smoke or not. Do not smoke!   If you relapse and smoke again, do not lose hope. Most smokers quit 3 times before they are successful.   If you relapse, do not give up! Plan ahead and think about what you will do the next time you get the urge to smoke.  LIFE AS A NONSMOKER: MAKE IT FOR A MONTH, MAKE IT FOR LIFE Day 1: Hang this page where you will see it every day. Day 2: Get rid of all ashtrays, matches, and lighters. Day 3: Drink water. Breathe deeply between sips. Day 4: Avoid places with smoke-filled air, such as bars, clubs, or the smoking section of restaurants. Day 5: Keep track of how much money you save by not smoking. Day 6: Avoid boredom. Keep a good book with you or go to the movies. Day 7: Reward yourself! One week without smoking! Day 8: Make a dental appointment to get your teeth cleaned. Day 9: Decide how you will turn down a cigarette before it is offered to you. Day 10: Review your reasons for quitting. Day 11: Distract yourself. Stay active to keep your mind off smoking and to relieve tension. Take a walk, exercise, read a book, do a crossword puzzle, or try a new hobby. Day 12: Exercise. Get off the bus before your stop or use stairs instead of escalators. Day 13: Call on friends  for support and encouragement. Day 14: Reward yourself! Two weeks without smoking! Day 15: Practice deep breathing exercises. Day 16: Bet a friend that you can stay a nonsmoker. Day 17: Ask to  sit in nonsmoking sections of restaurants. Day 18: Hang up "No Smoking" signs. Day 19: Think of yourself as a nonsmoker. Day 20: Each morning, tell yourself you will not smoke. Day 21: Reward yourself! Three weeks without smoking! Day 22: Think of smoking in negative ways. Remember how it stains your teeth, gives you bad breath, and leaves you short of breath. Day 23: Eat a nutritious breakfast. Day 24:Do not relive your days as a smoker. Day 25: Hold a pencil in your hand when talking on the telephone. Day 26: Tell all your friends you do not smoke. Day 27: Think about how much better food tastes. Day 28: Remember, one cigarette is one too many. Day 29: Take up a hobby that will keep your hands busy. Day 30: Congratulations! One month without smoking! Give yourself a big reward. Your caregiver can direct you to community resources or hospitals for support, which may include:  Group support.   Education.   Hypnosis.   Subliminal therapy.  Document Released: 09/26/2003 Document Revised: 12/17/2010 Document Reviewed: 10/14/2008 [facility] Patient Information 2012 [place], [place].

## 2011-03-20 NOTE — Op Note (Signed)
[facility] [address] [place], [place]  [address]  ENDOSCOPY PROCEDURE REPORT  PATIENT:  Michelle, [name]  MR#:  [id number] BIRTHDATE:  [birth year], 46 yrs. old  GENDER:  female  ENDOSCOPIST:  Wilhemina Bonito. Eda Keys, MD Referred by:  [facility],  PROCEDURE DATE:  03/20/2011 PROCEDURE:  EGD with biopsy, 43239 ASA CLASS:  Class II INDICATIONS:  nausea and vomiting, diarrhea  MEDICATIONS:   Fentanyl 75 mcg IV, Versed 7 mg IV, Benadryl 25 mg IV TOPICAL ANESTHETIC:  Cetacaine Spray  DESCRIPTION OF PROCEDURE:   After the risks benefits and alternatives of the procedure were thoroughly explained, informed consent was obtained.  The EG-[id number] ([id number]) endoscope was introduced through the mouth and advanced to the second portion of the duodenum, without limitations.  The instrument was slowly withdrawn as the mucosa was fully examined. <<PROCEDUREIMAGES>>  Proable Barrett's esophagus was found in the disal 8cm of the esophagus (28-36 cm). Multiple bxtaken. Retined gastric contents c/w Gastroparesis .  Otherwise the examination to the second duodeum was normal.    Retroflexed views revealed a hiatal hernia. The scope was then withdrawn from the patient and the procedure completed.  COMPLICATIONS:  None  ENDOSCOPIC IMPRESSION: 1) Probable Barrett's esophagus 2) Gastroparesis - accounts for N/V 3) Otherwise normal examination 4) GERD  RECOMMENDATIONS: 1) Anti-reflux regimen to be followed. SHE NEEDS TO LOSE SIGIFICANT WEIGHT AD EAT SMALLER PERTIONS OF FOOD. 2) NEEDS TO BE ON BID PPI FOR GERD/BARRETT'S (NOT FAMOTIDINE) 3) REPEAT SURVEILLANCE EGD IN 1 YEAR -IF BARRETT'S CONFIRMED. I WILL FOLLOW UP ON BX 4) INTERVAL GI FOLLOW UP PRN. AS SHE IS FELING BETTER, OK TO D/C HOME. WILL SIGN OFF.  ______________________________ Wilhemina Bonito. Eda Keys, MD  CC:  Julieanne Manson, MD;  The Patient  n. eSIGNED:   Wilhemina Bonito. Eda Keys at 03/20/2011 09:44  AM  Miguel Rota, [phone]

## 2011-03-20 NOTE — Discharge Summary (Addendum)
HOSPITAL DISCHARGE SUMMARY   @n   Michelle [name], 46 y.o., DOB 05-27-1965, MRN [id number]  Admission date: 03/17/2011 Discharge Date: 03/20/2011  Primary MD Alphonzo Grieve   Admitting Physician Eduard Clos, MD  Admission Diagnosis  Diarrhea [787.91] Dehydration [276.51] Vomiting [787.03] V, D epigastric pain.  nausea vomitting.  Discharge Diagnoses:    Active Hospital Problems  Diagnoses Date Noted   . Nausea and vomiting 03/18/2011   . Barrett esophagus 03/20/2011   . Esophageal reflux 03/20/2011   . Gastroparesis 03/20/2011   . Abdominal pain, epigastric 03/19/2011   . Diarrhea 03/18/2011   . ARF (acute renal failure) - improved 03/18/2011   . DIABETES MELLITUS, TYPE II, CONTROLLED 04/30/2009   . TOBACCO ABUSE 02/18/2009   . DEPRESSION 01/29/2007     Resolved Hospital Problems  Diagnoses Date Noted Date Resolved    Past Medical History  Diagnosis Date  . Asthma   . GERD (gastroesophageal reflux disease)   . Peptic ulcer disease 1984  . Episodic tension type headache   . Arthritis     hips, knees and spine  . Anxiety   . Depression   . Carpal tunnel syndrome of left wrist     Past Surgical History  Procedure Date  . Cervical fusion 2000  . Joint replacement 2009    left knee  . Back surgery 2008    lumbar  fusion  . Cholecystectomy 1991  . Carpal tunnel release 2011    right hand  . Carpal tunnel release 11/23/2010    Procedure: CARPAL TUNNEL RELEASE;  Surgeon: Cammy Copa;  Location: [facility] OR;  Service: Orthopedics;  Laterality: Left;  revision left carpal tunnel release neurolysis of median nerve    Consults  Dr. Yancey Flemings, Gastroenterology  Significant Tests:  See full reports for all details    Please see full EGD Report  Hospital Course See H&P, Labs, Consult and Test reports for all details. This is a 46 year old female with the history of asthma, depression and diet-controlled diabetes has been experiencing nausea  vomiting abdominal pain and diarrhea off and on for last 2-3 weeks. Patient had originally come to the ER 2 weeks ago and at that time CAT scan of the abdomen and pelvis did not show anything specific. Patient had been following with her PCP. Last 3 days her abdominal pain started recurring which is usually epigastric area radiating to the right flank along with it patient also has been having intractable nausea with vomiting and diarrhea. Was unable to keep anything down. Her PCP sent her to the ER again. In the ER yesterday patient had labs drawn which shows mild dehydration with increasing creatinine and a repeat CAT scan does not show anything acute. Patient at this time will be admitted for hydration and further observation. Patient still has some abdominal pain around the epigastric area. The pain increases with food. Eating also increase his nausea vomiting and diarrhea. Denies any chest pain shortness of breath fever chills. She was seen in consultation by GI and had an EGD study revealing barretts esophagitis.  She will need to be on PPI therapy BID and follow up with GI.  Pt had been on pepcid PTA.  Pt will be discharged home with close follow up with her PCP.  The patient's ARF improved with IVFs.  Pt will need to have BMP rechecked with PCP on follow up.     No resolved problems to display.  Active Hospital Problems  Diagnoses  Date Noted   . Nausea and vomiting 03/18/2011   . Barrett esophagus 03/20/2011   . Esophageal reflux 03/20/2011   . Gastroparesis 03/20/2011   . Abdominal pain, epigastric 03/19/2011   . Diarrhea 03/18/2011   . ARF (acute renal failure) 03/18/2011   . DIABETES MELLITUS, TYPE II, CONTROLLED 04/30/2009   . TOBACCO ABUSE 02/18/2009   . DEPRESSION 01/29/2007     Resolved Hospital Problems  Diagnoses Date Noted Date Resolved     Today's Assessment:   Subjective:   Michelle [name]  Pt is awake, alert, comfortable, in no distress.  She is eating well.  She is  not nauseated or vomiting and has no diarrhea at this time. Her intermittent abdominal pain is currently not present.   Objective:   Blood pressure 128/76, pulse 69, temperature 98 F (36.7 C), temperature source Oral, resp. rate 19, height 5\' 8"  (1.727 m), weight 131.997 kg (291 lb), last menstrual period 12/23/2010, SpO2 96.00%.  Intake/Output Summary (Last 24 hours) at 03/20/11 1152 Last data filed at 03/20/11 0600  Gross per 24 hour  Intake   1960 ml  Output      1 ml  Net   1959 ml   Exam General - awake, alert, NAD, cooperative HEENT - MMM, NCAT, sclera white Neck - supple, no JVD ABD - obese, soft, +BS, no masses EXT - no cyanosis or clubbing noted Neuro - no focal findings.   Lab Results  Component Value Date   WBC 4.5 03/20/2011   HGB 12.3 03/20/2011   HCT 37.1 03/20/2011   PLT 122* 03/20/2011   LYMPHOPCT 34 03/17/2011   MONOPCT 7 03/17/2011   EOSPCT 2 03/17/2011   BASOPCT 0 03/17/2011   CMP: Lab Results  Component Value Date   NA 141 03/20/2011   K 3.6 03/20/2011   CL 105 03/20/2011   CO2 29 03/20/2011   BUN 7 03/20/2011   CREATININE 1.24* 03/20/2011   PROT 6.0 03/19/2011   ALBUMIN 2.7* 03/19/2011   BILITOT 0.3 03/19/2011   ALKPHOS 98 03/19/2011   AST 14 03/19/2011   ALT 11 03/19/2011  .  Discharge Instructions     Please see dc AVS   DISCHARGE MEDICATION: Medication List  As of 03/20/2011 11:52 AM   STOP taking these medications         famotidine 40 MG tablet      furosemide 20 MG tablet      gabapentin 600 MG tablet      hydrOXYzine 25 MG tablet      hydrOXYzine 50 MG tablet      pantoprazole 20 MG tablet      potassium chloride 10 MEQ CR tablet         TAKE these medications         albuterol 108 (90 BASE) MCG/ACT inhaler   Commonly known as: PROVENTIL HFA;VENTOLIN HFA   Inhale 2 puffs into the lungs 2 (two) times daily as needed. FOR WHEEZING        diphenhydrAMINE 50 MG capsule   Commonly known as: BENADRYL   Take 1 capsule (50 mg total) by mouth every 6  (six) hours as needed for itching.      DULoxetine 60 MG capsule   Commonly known as: CYMBALTA   Take 60 mg by mouth every morning.      DULoxetine 30 MG capsule   Commonly known as: CYMBALTA   Take 30 mg by mouth at bedtime.  gabapentin 300 MG capsule   Commonly known as: NEURONTIN   Take 300 mg by mouth 4 (four) times daily.      omeprazole 40 MG capsule   Commonly known as: PRILOSEC   Take 1 capsule (40 mg total) by mouth 2 (two) times daily.      promethazine 12.5 MG tablet   Commonly known as: PHENERGAN   Take 1 tablet (12.5 mg total) by mouth every 6 (six) hours as needed for nausea.      traMADol 50 MG tablet   Commonly known as: ULTRAM   Take 50-100 mg by mouth every 6 (six) hours as needed. For pain      traMADol 50 MG tablet   Commonly known as: ULTRAM   Take 1-2 tablets (50-100 mg total) by mouth every 6 (six) hours as needed.      traZODone 50 MG tablet   Commonly known as: DESYREL   Take 75 mg by mouth at bedtime.            Disposition and Follow-up:  Discharge Orders    Future Orders Please Complete By Expires   Increase activity slowly      Discharge instructions      Comments:   PLEASE STOP SMOKING PLEASE FOLLOW UP WITH DR AMAO IN 1 WEEK  PLEASE FOLLOW UP WITH DR. PERRY 1 MONTH ANTI-REFLUX DIET AND LOW CARB DIET RECOMMENDED CHECK YOUR BLOOD SUGAR AT LEAST ONCE PER DAY AND REPORT NUMBERS TO PRIMARY CARE PROVIDER RETURN IF SYMPTOMS RECUR, WORSEN OR NEW PROBLEMS DEVELOP.    Call MD for:  persistant nausea and vomiting      Discontinue IV        Follow-up Information    Follow up with Jaclyn Shaggy, MD in 1 week.   Contact information:   [address]  [facility] [place] [address] [phone]       Follow up with Yancey Flemings, MD in 1 month.   Contact information:   [address] [facility] [address] [place] [address] [place] [address] [phone]         The risks, benefits, and possible side  effects of all treatments and tests were explained to the patient.  The patient verbalized understanding.  The importance of close follow up with the primary care medical provider was explained clearly to the patient.  The patient verbalized understanding.  The patient was given instructions to return if symptoms recur, worsen or new changes develop.  The patient verbalized understanding.   Cleora Fleet, MD, CDE, FAAFP [facility] [place] [facility], [place]   Total Time spent reviewing critical document, reviewing this patient's comprehensive hospitalization, arranging follow up and coordination of care, reviewing data and todays exam greater than 35 minutes.   Signed: Radin Raptis 03/20/2011 11:52 AM

## 2011-03-20 NOTE — Progress Notes (Signed)
Discharged to home. Via wheelchair to family car

## 2011-03-22 ENCOUNTER — Encounter (HOSPITAL_COMMUNITY): Payer: Self-pay | Admitting: Internal Medicine

## 2011-03-23 ENCOUNTER — Encounter: Payer: Self-pay | Admitting: Internal Medicine

## 2011-04-07 ENCOUNTER — Encounter: Payer: Self-pay | Admitting: Internal Medicine

## 2011-04-27 ENCOUNTER — Emergency Department (HOSPITAL_COMMUNITY): Payer: Medicaid Other

## 2011-04-27 ENCOUNTER — Encounter (HOSPITAL_COMMUNITY): Payer: Self-pay

## 2011-04-27 ENCOUNTER — Emergency Department (HOSPITAL_COMMUNITY)
Admission: EM | Admit: 2011-04-27 | Discharge: 2011-04-27 | Disposition: A | Discharge status: Home / Self Care | Payer: Medicaid Other | Attending: Emergency Medicine | Admitting: Emergency Medicine

## 2011-04-27 DIAGNOSIS — Z9089 Acquired absence of other organs: Secondary | ICD-10-CM | POA: Insufficient documentation

## 2011-04-27 DIAGNOSIS — R10817 Generalized abdominal tenderness: Secondary | ICD-10-CM | POA: Insufficient documentation

## 2011-04-27 DIAGNOSIS — R350 Frequency of micturition: Secondary | ICD-10-CM | POA: Insufficient documentation

## 2011-04-27 DIAGNOSIS — R109 Unspecified abdominal pain: Secondary | ICD-10-CM

## 2011-04-27 LAB — DIFFERENTIAL
Basophils Absolute: 0 10*3/uL (ref 0.0–0.1)
Basophils Relative: 0 % (ref 0–1)
Eosinophils Absolute: 0.2 10*3/uL (ref 0.0–0.7)
Eosinophils Relative: 4 % (ref 0–5)
Lymphocytes Relative: 48 % — ABNORMAL HIGH (ref 12–46)
Lymphs Abs: 2.1 10*3/uL (ref 0.7–4.0)
Monocytes Absolute: 0.2 10*3/uL (ref 0.1–1.0)
Monocytes Relative: 5 % (ref 3–12)
Neutro Abs: 1.9 10*3/uL (ref 1.7–7.7)
Neutrophils Relative %: 43 % (ref 43–77)

## 2011-04-27 LAB — URINALYSIS, ROUTINE W REFLEX MICROSCOPIC
Bilirubin Urine: NEGATIVE
Glucose, UA: NEGATIVE mg/dL
Hgb urine dipstick: NEGATIVE
Ketones, ur: NEGATIVE mg/dL
Nitrite: NEGATIVE
Protein, ur: NEGATIVE mg/dL
Specific Gravity, Urine: 1.004 — ABNORMAL LOW (ref 1.005–1.030)
Urobilinogen, UA: 0.2 mg/dL (ref 0.0–1.0)
pH: 6 (ref 5.0–8.0)

## 2011-04-27 LAB — CBC
HCT: 40.5 % (ref 36.0–46.0)
Hemoglobin: 13.7 g/dL (ref 12.0–15.0)
MCH: 30.2 pg (ref 26.0–34.0)
MCHC: 33.8 g/dL (ref 30.0–36.0)
MCV: 89.4 fL (ref 78.0–100.0)
Platelets: 147 10*3/uL — ABNORMAL LOW (ref 150–400)
RBC: 4.53 MIL/uL (ref 3.87–5.11)
RDW: 14 % (ref 11.5–15.5)
WBC: 4.4 10*3/uL (ref 4.0–10.5)

## 2011-04-27 LAB — COMPREHENSIVE METABOLIC PANEL
ALT: 14 U/L (ref 0–35)
AST: 15 U/L (ref 0–37)
Albumin: 2.9 g/dL — ABNORMAL LOW (ref 3.5–5.2)
Alkaline Phosphatase: 109 U/L (ref 39–117)
BUN: 8 mg/dL (ref 6–23)
CO2: 27 mEq/L (ref 19–32)
Calcium: 8.9 mg/dL (ref 8.4–10.5)
Chloride: 102 mEq/L (ref 96–112)
Creatinine, Ser: 1.12 mg/dL — ABNORMAL HIGH (ref 0.50–1.10)
GFR calc Af Amer: 67 mL/min — ABNORMAL LOW (ref >90)
GFR calc non Af Amer: 58 mL/min — ABNORMAL LOW (ref >90)
Glucose, Bld: 90 mg/dL (ref 70–99)
Potassium: 3.9 mEq/L (ref 3.5–5.1)
Sodium: 138 mEq/L (ref 135–145)
Total Bilirubin: 0.2 mg/dL — ABNORMAL LOW (ref 0.3–1.2)
Total Protein: 6.7 g/dL (ref 6.0–8.3)

## 2011-04-27 LAB — URINE MICROSCOPIC-ADD ON

## 2011-04-27 LAB — PREGNANCY, URINE: Preg Test, Ur: NEGATIVE

## 2011-04-27 LAB — LIPASE, BLOOD: Lipase: 26 U/L (ref 11–59)

## 2011-04-27 MED ORDER — METOCLOPRAMIDE HCL 10 MG PO TABS: 10.0000 mg | ORAL_TABLET | Freq: Once | ORAL | Status: DC

## 2011-04-27 MED ORDER — OXYCODONE-ACETAMINOPHEN 5-325 MG PO TABS: 1.0000 | ORAL_TABLET | ORAL | Status: AC | PRN

## 2011-04-27 MED ORDER — METOCLOPRAMIDE HCL 10 MG PO TABS
10.0000 mg | ORAL_TABLET | ORAL | Status: AC
  Administered 2011-04-27: 10 mg via ORAL
  Filled 2011-04-27 (×2): qty 1

## 2011-04-27 MED ORDER — ONDANSETRON HCL 4 MG/2ML IJ SOLN
4.0000 mg | Freq: Once | INTRAMUSCULAR | Status: AC
  Administered 2011-04-27: 4 mg via INTRAVENOUS
  Filled 2011-04-27: qty 2

## 2011-04-27 MED ORDER — SODIUM CHLORIDE 0.9 % IV SOLN: Freq: Once | INTRAVENOUS | Status: DC

## 2011-04-27 MED ORDER — KETOROLAC TROMETHAMINE 30 MG/ML IJ SOLN
30.0000 mg | Freq: Once | INTRAMUSCULAR | Status: AC
  Administered 2011-04-27: 30 mg via INTRAVENOUS
  Filled 2011-04-27: qty 1

## 2011-04-27 MED ORDER — SODIUM CHLORIDE 0.9 % IV BOLUS (SEPSIS)
1000.0000 mL | Freq: Once | INTRAVENOUS | Status: AC
  Administered 2011-04-27: 1000 mL via INTRAVENOUS

## 2011-04-27 MED ORDER — OXYCODONE-ACETAMINOPHEN 5-325 MG PO TABS
1.0000 | ORAL_TABLET | Freq: Once | ORAL | Status: AC
  Administered 2011-04-27: 1 via ORAL
  Filled 2011-04-27: qty 1

## 2011-04-27 NOTE — ED Notes (Signed)
Per EMS: patient from home, reporting lower back and abdominal pain x 2 hours. Patient has hx of acute kidney failure.Marland Kitchen

## 2011-04-27 NOTE — Discharge Instructions (Signed)
Abdominal Pain  Abdominal pain can be caused by many things. Your caregiver decides the seriousness of your pain by an examination and possibly blood tests and X-rays. Many cases can be observed and treated at home. Most abdominal pain is not caused by a disease and will probably improve without treatment. However, in many cases, more time must pass before a clear cause of the pain can be found. Before that point, it may not be known if you need more testing, or if hospitalization or surgery is needed.  HOME CARE INSTRUCTIONS    Do not take laxatives unless directed by your caregiver.   Take pain medicine only as directed by your caregiver.   Only take over-the-counter or prescription medicines for pain, discomfort, or fever as directed by your caregiver.   Try a clear liquid diet (broth, tea, or water) for as long as directed by your caregiver. Slowly move to a bland diet as tolerated.  SEEK IMMEDIATE MEDICAL CARE IF:    The pain does not go away.   You have a fever.   You keep throwing up (vomiting).   The pain is felt only in portions of the abdomen. Pain in the right side could possibly be appendicitis. In an adult, pain in the left lower portion of the abdomen could be colitis or diverticulitis.   You pass bloody or black tarry stools.  MAKE SURE YOU:    Understand these instructions.   Will watch your condition.   Will get help right away if you are not doing well or get worse.  Document Released: 10/07/2004 Document Revised: 12/17/2010 Document Reviewed: 08/16/2007  Exit[employer] Patient Information 2012 [employer].        Gastroparesis   Gastroparesis is also called slowed stomach emptying (delayed gastric emptying). It is a condition in which the stomach takes too long to empty its contents. It often happens in people with diabetes.   CAUSES   Gastroparesis happens when nerves to the stomach are damaged or stop working. When the nerves are damaged, the muscles of the stomach and intestines do  not work normally. The movement of food is slowed or stopped. High blood glucose (sugar) causes changes in nerves and can damage the blood vessels that carry oxygen and nutrients to the nerves.  RISK FACTORS   Diabetes.   Post-viral syndromes.   Eating disorders (anorexia, bulimia).   Surgery on the stomach or vagus nerve.   Gastroesophageal reflux disease (rarely).   Smooth muscle disorders (amyloidosis, scleroderma).   Metabolic disorders, including hypothyroidism.   Parkinson's disease.  SYMPTOMS    Heartburn.   Feeling sick to your stomach (nausea).   Vomiting of undigested food.   An early feeling of fullness when eating.   Weight loss.   Abdominal bloating.   Erratic blood glucose levels.   Lack of appetite.   Gastroesophageal reflux.   Spasms of the stomach wall.  Complications can include:   Bacterial overgrowth in stomach. Food stays in the stomach and can ferment and cause bacteria to grow.   Weight loss due to difficulty digesting and absorbing nutrients.   Vomiting.   Obstruction in the stomach. Undigested food can harden and cause nausea and vomiting.   Blood glucose fluctuations caused by inconsistent food absorption.  DIAGNOSIS   The diagnosis of gastroparesis is confirmed through one or more of the following tests:   Barium X-rays and scans. These tests look at how long it takes for food to move through the stomach.     Gastric manometry. This test measures electrical and muscular activity in the stomach. A thin tube is passed down the throat into the stomach. The tube contains a wire that takes measurements of the stomach's electrical and muscular activity as it digests liquids and solid food.   Endoscopy. This procedure is done with a long, thin tube called an endoscope. It is passed through the mouth and gently guides down the esophagus into the stomach. This tube helps the caregiver look at the lining of the stomach to check for any abnormalities.   Ultrasound. This can  rule out gallbladder disease or pancreatitis. This test will outline and define the shape of the gallbladder and pancreas.  TREATMENT    The primary treatment is to identify the problem and help control blood glucose levels. Treatments include:   Exercise.   Medicines to control nausea and vomiting.   Medicines to stimulate stomach muscles.   Changes in what and when you eat.   Having smaller meals more often.   Eating low-fiber forms of high-fiber foods, such aseating cooked vegetables instead of raw vegetables.   Eating low-fat foods.   Consuming liquids, which are easier to digest.   In severe cases, feeding tubes and intravenous (IV) feeding may be needed.  It is important to note that in most cases, treatment does not cure gastroparesis. It is usually a lasting (chronic) condition. Treatment helps you manage the condition so that you can be as healthy and comfortable as possible.  NEW TREATMENTS   A gastric neurostimulator has been developed to assist people with gastroparesis. The battery-operated device is surgically implanted. It emits mild electrical pulses to help improve stomach emptying and to control nausea and vomiting.   The use of botulinum toxin has been shown to improve stomach emptying by decreasing the prolonged contractions of the muscle between the stomach and the small intestine (pyloric sphincter). The benefits are temporary.  SEEK MEDICAL CARE IF:    You are having problems keeping your blood glucose in goal range.   You are having nausea, vomiting, bloating, or early feelings of fullness with eating.   Your symptoms do not change with a change in diet.  Document Released: 12/28/2004 Document Revised: 12/17/2010 Document Reviewed: 06/06/2008  [employer] Patient Information 2012 [employer].

## 2011-04-27 NOTE — ED Provider Notes (Signed)
History     CSN: [id number]  Arrival date & time 04/27/11  1707   First MD Initiated Contact with Patient 04/27/11 1735      Chief Complaint  Patient presents with  . Abdominal Pain  . Back Pain    Location-R abd/radiation-to flank/quality-dull/duration-several hours/timing-constant/severity-mild to moderate/No associated sxs/No prior treatment) Patient is a 46 y.o. female presenting with abdominal pain. The history is provided by the patient. No language interpreter was used.  Abdominal Pain The primary symptoms of the illness include abdominal pain. The primary symptoms of the illness do not include fever, fatigue, shortness of breath, nausea, vomiting, diarrhea, hematemesis, hematochezia, dysuria, vaginal discharge or vaginal bleeding. The current episode started 3 to 5 hours ago. The onset of the illness was sudden. The problem has not changed since onset. The patient states that she believes she is currently not pregnant. The patient has not had a change in bowel habit. Additional symptoms associated with the illness include frequency. Symptoms associated with the illness do not include chills, anorexia, diaphoresis, heartburn, constipation, urgency, hematuria or back pain. Significant associated medical issues include PUD.    Past Medical History  Diagnosis Date  . Asthma   . GERD (gastroesophageal reflux disease)   . Peptic ulcer disease 1984  . Episodic tension type headache   . Arthritis     hips, knees and spine  . Anxiety   . Depression   . Carpal tunnel syndrome of left wrist     Past Surgical History  Procedure Date  . Cervical fusion 2000  . Joint replacement 2009    left knee  . Back surgery 2008    lumbar  fusion  . Cholecystectomy 1991  . Carpal tunnel release 2011    right hand  . Carpal tunnel release 11/23/2010    Procedure: CARPAL TUNNEL RELEASE;  Surgeon: Cammy Copa;  Location: [facility] OR;  Service: Orthopedics;  Laterality: Left;  revision left  carpal tunnel release neurolysis of median nerve  . Esophagogastroduodenoscopy 03/20/2011    Procedure: ESOPHAGOGASTRODUODENOSCOPY (EGD);  Surgeon: Hilarie Fredrickson, MD;  Location: [facility] ENDOSCOPY;  Service: Endoscopy;  Laterality: N/A;  sara /ja    Family History  Problem Relation Age of Onset  . Esophageal cancer Father     History  Substance Use Topics  . Smoking status: Current Everyday Smoker -- 0.5 packs/day for 30 years    Types: Cigarettes  . Smokeless tobacco: Never Used  . Alcohol Use: No    OB History    Grav Para Term Preterm Abortions TAB SAB Ect Mult Living                  Review of Systems  Constitutional: Negative for fever, chills, diaphoresis and fatigue.  HENT: Negative for trouble swallowing, neck pain and neck stiffness.   Eyes: Negative for pain, discharge and itching.  Respiratory: Negative for cough, chest tightness and shortness of breath.   Cardiovascular: Negative for chest pain, palpitations and leg swelling.  Gastrointestinal: Positive for abdominal pain. Negative for heartburn, nausea, vomiting, diarrhea, constipation, blood in stool, hematochezia, abdominal distention, anal bleeding, anorexia and hematemesis.  Genitourinary: Positive for frequency. Negative for dysuria, urgency, hematuria, flank pain, decreased urine volume, vaginal bleeding, vaginal discharge, difficulty urinating and pelvic pain.  Musculoskeletal: Negative for back pain and joint swelling.  Skin: Negative for rash and wound.  Neurological: Negative for dizziness, tremors, seizures, syncope, facial asymmetry, speech difficulty, weakness, light-headedness, numbness and headaches.  Hematological: Negative for adenopathy.  Does not bruise/bleed easily.  Psychiatric/Behavioral: Negative for confusion and decreased concentration.    Allergies  Codeine and Penicillins  Home Medications   Current Outpatient Rx  Name Route Sig Dispense Refill  . ALBUTEROL SULFATE HFA 108 (90 BASE) MCG/ACT  IN AERS Inhalation Inhale 2 puffs into the lungs 2 (two) times daily as needed. FOR WHEEZING      . DIPHENHYDRAMINE HCL 50 MG PO CAPS Oral Take 50 mg by mouth every 6 (six) hours as needed. For allergies    . DOXEPIN HCL 50 MG PO CAPS Oral Take 50 mg by mouth at bedtime.    . DULOXETINE HCL 30 MG PO CPEP Oral Take 30-60 mg by mouth 2 (two) times daily. Take 2 capsules by mouth in the morning and 1 capsule by mouth at night    . GABAPENTIN 300 MG PO CAPS Oral Take 600 mg by mouth 2 (two) times daily.     Marland Kitchen HYDROXYZINE HCL 25 MG PO TABS Oral Take 25 mg by mouth every 6 (six) hours as needed. For itching    . OMEPRAZOLE 40 MG PO CPDR Oral Take 1 capsule (40 mg total) by mouth 2 (two) times daily. 60 capsule 0  . POTASSIUM CHLORIDE CRYS ER 10 MEQ PO TBCR Oral Take 10 mEq by mouth daily.    Marland Kitchen PROMETHAZINE HCL 12.5 MG PO TABS Oral Take 12.5 mg by mouth every 6 (six) hours as needed. For nausea/vomiting    . TRAZODONE HCL 50 MG PO TABS Oral Take 75 mg by mouth at bedtime.     . TRIAMCINOLONE ACETONIDE 0.1 % EX CREA Topical Apply 1 application topically 3 (three) times daily as needed. For rash    . DIPHENHYDRAMINE HCL 50 MG PO CAPS Oral Take 1 capsule (50 mg total) by mouth every 6 (six) hours as needed for itching. 30 capsule 0  . PROMETHAZINE HCL 12.5 MG PO TABS Oral Take 1 tablet (12.5 mg total) by mouth every 6 (six) hours as needed for nausea. 30 tablet 0    BP 122/79  Pulse 69  Temp(Src) 97.5 F (36.4 C) (Oral)  Resp 18  SpO2 98%  LMP 02/27/2011  Physical Exam  Constitutional: She is oriented to person, place, and time. She appears well-developed and well-nourished. No distress.  HENT:  Head: Normocephalic and atraumatic.  Eyes: Conjunctivae are normal. Right eye exhibits no discharge. Left eye exhibits no discharge. No scleral icterus.  Neck: Normal range of motion. Neck supple.  Cardiovascular: Normal rate, regular rhythm, normal heart sounds and intact distal pulses.   No murmur  heard. Pulmonary/Chest: Effort normal and breath sounds normal. No stridor. No respiratory distress. She has no wheezes. She has no rales. She exhibits no tenderness.  Abdominal: Soft. Bowel sounds are normal. She exhibits no distension, no abdominal bruit, no pulsatile midline mass and no mass. There is no hepatosplenomegaly. There is generalized tenderness. There is no rigidity, no rebound, no guarding, no CVA tenderness and negative Murphy's sign. No hernia.  Musculoskeletal: Normal range of motion. She exhibits no edema and no tenderness.  Neurological: She is alert and oriented to person, place, and time.  Skin: Skin is warm and dry. She is not diaphoretic.  Psychiatric: She has a normal mood and affect.    ED Course  Procedures (including critical care time)  Labs Reviewed  CBC - Abnormal; Notable for the following:    Platelets 147 (*)    All other components within normal limits  DIFFERENTIAL -  Abnormal; Notable for the following:    Lymphocytes Relative 48 (*)    All other components within normal limits  COMPREHENSIVE METABOLIC PANEL - Abnormal; Notable for the following:    Creatinine, Ser 1.12 (*)    Albumin 2.9 (*)    Total Bilirubin 0.2 (*)    GFR calc non Af Amer 58 (*)    GFR calc Af Amer 67 (*)    All other components within normal limits  URINALYSIS, ROUTINE W REFLEX MICROSCOPIC - Abnormal; Notable for the following:    APPearance HAZY (*)    Specific Gravity, Urine 1.004 (*)    Leukocytes, UA TRACE (*)    All other components within normal limits  URINE MICROSCOPIC-ADD ON - Abnormal; Notable for the following:    Squamous Epithelial / LPF MANY (*)    Bacteria, UA FEW (*)    All other components within normal limits  LIPASE, BLOOD  PREGNANCY, URINE  URINE CULTURE   Ct Abdomen Pelvis Wo Contrast  04/27/2011  *RADIOLOGY REPORT*  Clinical Data: Right-sided flank pain radiating to the right groin for 2 hours.  CT ABDOMEN AND PELVIS WITHOUT CONTRAST  Technique:   Multidetector CT imaging of the abdomen and pelvis was performed following the standard protocol without intravenous contrast.  Comparison: 03/18/2011  Findings: At the right greater than left lung bases, there is dependent subpleural density, likely atelectasis, with a 8 mm area of ground-glass nodular airspace opacity on image 5.  This is new since the prior study.  No pleural effusion.  Left lung is clear.  Cholecystectomy clips noted.  No radiopaque renal, ureteral, or bladder calculus.  No hydroureteronephrosis.  Liver, spleen, partially fatty infiltrated pancreas, and adrenal glands are normal.  No adenopathy or free fluid.  Uterus and ovaries are normal.  No bowel wall thickening or focal segmental dilatation.  The appendix is normal.  No pelvic free fluid or lymphadenopathy.  No free air.  Evidence of L5 S1 fusion.  Osseous structures otherwise unremarkable.  IMPRESSION: No acute intra-abdominal or pelvic pathology.  Specifically, no radiopaque renal or ureteral calculus.  Patchy subpleural and ground-glass lower lobe nodular consolidation.  Given development since the recent prior exam, this is most likely due to atelectasis or infection/inflammation.  Early pneumonia may occasionally cause right-sided abdominal pain but these findings are likely too subtle to account for the patient's symptoms.  Follow-up chest CT is recommended in 6-12 months.  Original Report Authenticated By: Harrel Lemon, M.D.     1. Abdominal pain, acute    MDM  Pt is a well appearing 46yo F with PMH of DM and gastroporesis who presents with several hours of sudden R side abd pain but diffuse pain on exam without peritoneal signs. No N/V/D. Pt states possibly some urinary sx. UA contaminated but pt not felt to have UTI. CT stone study negative. Clinical picture not consistent with retained biliary stone, UTI, pyelo, renal stone, AAA, ectopic preg, ovarian torsion, mesenteric ischemia, DKA, PNA, ACS, pancreatitis or PUD. Sx  likely 2/2 gastroporesis. Pt given zofran and pain meds with significant improvement in pain. D/C home in stable condition with strict return precautions.         Consuello Masse, MD 04/28/11 0111

## 2011-04-27 NOTE — ED Notes (Signed)
PT states pain only reduced to 8/10.  She is requesting stronger pain medication and ice chips.

## 2011-04-27 NOTE — ED Notes (Signed)
Patient presents with right flank pain with radiation to right lower groin x 2 hours. Patient reporting a hx of acute kidney failure and reports she has been admitted for same in past.  Abdomen soft, non distended, bowel sounds present, patient resting, reporting urinary frequency and urgency.

## 2011-04-29 LAB — URINE CULTURE
Colony Count: >=100000
Culture  Setup Time: 201304161910

## 2011-05-01 NOTE — ED Provider Notes (Signed)
I saw and evaluated the patient, reviewed the resident's note and I agree with the findings and plan.  Pt with diffuse abdominal pain on exam, abdominal CT negative.  Pt with hx of gastroparesis.  Pt discharged with strict return precautions.  She is agreeable with this plan.   Ethelda Chick, MD 05/01/11 [phone]

## 2011-05-05 ENCOUNTER — Emergency Department (HOSPITAL_COMMUNITY)
Admission: EM | Admit: 2011-05-05 | Discharge: 2011-05-05 | Disposition: A | Discharge status: Home / Self Care | Payer: Medicaid Other | Attending: Emergency Medicine | Admitting: Emergency Medicine

## 2011-05-05 ENCOUNTER — Encounter (HOSPITAL_COMMUNITY): Payer: Self-pay | Admitting: *Deleted

## 2011-05-05 DIAGNOSIS — R109 Unspecified abdominal pain: Secondary | ICD-10-CM | POA: Insufficient documentation

## 2011-05-05 DIAGNOSIS — J45909 Unspecified asthma, uncomplicated: Secondary | ICD-10-CM | POA: Insufficient documentation

## 2011-05-05 DIAGNOSIS — K219 Gastro-esophageal reflux disease without esophagitis: Secondary | ICD-10-CM | POA: Insufficient documentation

## 2011-05-05 DIAGNOSIS — Z79899 Other long term (current) drug therapy: Secondary | ICD-10-CM | POA: Insufficient documentation

## 2011-05-05 DIAGNOSIS — F341 Dysthymic disorder: Secondary | ICD-10-CM | POA: Insufficient documentation

## 2011-05-05 DIAGNOSIS — M549 Dorsalgia, unspecified: Secondary | ICD-10-CM | POA: Insufficient documentation

## 2011-05-05 LAB — COMPREHENSIVE METABOLIC PANEL
ALT: 27 U/L (ref 0–35)
AST: 26 U/L (ref 0–37)
Albumin: 3.8 g/dL (ref 3.5–5.2)
Alkaline Phosphatase: 99 U/L (ref 39–117)
BUN: 18 mg/dL (ref 6–23)
CO2: 26 mEq/L (ref 19–32)
Calcium: 10 mg/dL (ref 8.4–10.5)
Chloride: 99 mEq/L (ref 96–112)
Creatinine, Ser: 1.15 mg/dL — ABNORMAL HIGH (ref 0.50–1.10)
GFR calc Af Amer: 65 mL/min — ABNORMAL LOW (ref >90)
GFR calc non Af Amer: 56 mL/min — ABNORMAL LOW (ref >90)
Glucose, Bld: 127 mg/dL — ABNORMAL HIGH (ref 70–99)
Potassium: 3.9 mEq/L (ref 3.5–5.1)
Sodium: 137 mEq/L (ref 135–145)
Total Bilirubin: 0.2 mg/dL — ABNORMAL LOW (ref 0.3–1.2)
Total Protein: 7.6 g/dL (ref 6.0–8.3)

## 2011-05-05 LAB — DIFFERENTIAL
Basophils Absolute: 0 10*3/uL (ref 0.0–0.1)
Basophils Relative: 0 % (ref 0–1)
Eosinophils Absolute: 0 10*3/uL (ref 0.0–0.7)
Eosinophils Relative: 0 % (ref 0–5)
Lymphocytes Relative: 12 % (ref 12–46)
Lymphs Abs: 1 10*3/uL (ref 0.7–4.0)
Monocytes Absolute: 0.2 10*3/uL (ref 0.1–1.0)
Monocytes Relative: 2 % — ABNORMAL LOW (ref 3–12)
Neutro Abs: 7.8 10*3/uL — ABNORMAL HIGH (ref 1.7–7.7)
Neutrophils Relative %: 86 % — ABNORMAL HIGH (ref 43–77)

## 2011-05-05 LAB — CBC
HCT: 43 % (ref 36.0–46.0)
Hemoglobin: 14.8 g/dL (ref 12.0–15.0)
MCH: 30.4 pg (ref 26.0–34.0)
MCHC: 34.4 g/dL (ref 30.0–36.0)
MCV: 88.3 fL (ref 78.0–100.0)
Platelets: 122 10*3/uL — ABNORMAL LOW (ref 150–400)
RBC: 4.87 MIL/uL (ref 3.87–5.11)
RDW: 13.9 % (ref 11.5–15.5)
WBC: 9 10*3/uL (ref 4.0–10.5)

## 2011-05-05 LAB — URINALYSIS, ROUTINE W REFLEX MICROSCOPIC
Bilirubin Urine: NEGATIVE
Glucose, UA: NEGATIVE mg/dL
Hgb urine dipstick: NEGATIVE
Ketones, ur: NEGATIVE mg/dL
Leukocytes, UA: NEGATIVE
Nitrite: NEGATIVE
Protein, ur: NEGATIVE mg/dL
Specific Gravity, Urine: 1.01 (ref 1.005–1.030)
Urobilinogen, UA: 0.2 mg/dL (ref 0.0–1.0)
pH: 5.5 (ref 5.0–8.0)

## 2011-05-05 MED ORDER — SODIUM CHLORIDE 0.9 % IV BOLUS (SEPSIS)
1000.0000 mL | Freq: Once | INTRAVENOUS | Status: AC
  Administered 2011-05-05: 1000 mL via INTRAVENOUS

## 2011-05-05 MED ORDER — TRAMADOL HCL 50 MG PO TABS
50.0000 mg | ORAL_TABLET | Freq: Once | ORAL | Status: DC
  Filled 2011-05-05: qty 1

## 2011-05-05 MED ORDER — OXYCODONE-ACETAMINOPHEN 5-325 MG PO TABS: 1.0000 | ORAL_TABLET | Freq: Four times a day (QID) | ORAL | Status: DC | PRN

## 2011-05-05 NOTE — ED Notes (Signed)
Pt resting in stretcher in NAD, respirations even and unlabored. 

## 2011-05-05 NOTE — ED Provider Notes (Addendum)
History     CSN: [id number]  Arrival date & time 05/05/11  1527   First MD Initiated Contact with Patient 05/05/11 1620      Chief Complaint  Patient presents with  . Back Pain  . Abdominal Pain    (Consider location/radiation/quality/duration/timing/severity/associated sxs/prior treatment) HPI The patient presents with concerns of right sided abdominal and back pain.  She notes that over the past 6 weeks she has had pain persistently in this distribution.  The pain is sharp, worse with motion, not significantly changed with multiple medication time of onset she was admitted to the hospital and had acute renal failure.  Since that time she notes that she has not had dialysis, she continues to eat, drink, has normal bowel movements.  She denies any nausea, vomiting, fevers, chills, chest pain, dyspnea.  The patient was seen here one week ago for a similar complaints.  The evaluation included CT abdomen pelvis, which was unremarkable. To engage the patient in my initial evaluation I had to awaken her from sleep. Past Medical History  Diagnosis Date  . Asthma   . GERD (gastroesophageal reflux disease)   . Peptic ulcer disease 1984  . Episodic tension type headache   . Arthritis     hips, knees and spine  . Anxiety   . Depression   . Carpal tunnel syndrome of left wrist   . Barrett's esophagus   . Gastroparesis     Past Surgical History  Procedure Date  . Cervical fusion 2000  . Joint replacement 2009    left knee  . Back surgery 2008    lumbar  fusion  . Cholecystectomy 1991  . Carpal tunnel release 2011    right hand  . Carpal tunnel release 11/23/2010    Procedure: CARPAL TUNNEL RELEASE;  Surgeon: Cammy Copa;  Location: [facility] OR;  Service: Orthopedics;  Laterality: Left;  revision left carpal tunnel release neurolysis of median nerve  . Esophagogastroduodenoscopy 03/20/2011    Procedure: ESOPHAGOGASTRODUODENOSCOPY (EGD);  Surgeon: Hilarie Fredrickson, MD;  Location: [facility]  ENDOSCOPY;  Service: Endoscopy;  Laterality: N/A;  sara /ja    Family History  Problem Relation Age of Onset  . Esophageal cancer Father     History  Substance Use Topics  . Smoking status: Current Everyday Smoker -- 0.5 packs/day for 30 years    Types: Cigarettes  . Smokeless tobacco: Never Used  . Alcohol Use: No    OB History    Grav Para Term Preterm Abortions TAB SAB Ect Mult Living                  Review of Systems  Constitutional:       HPI  HENT:       HPI otherwise negative  Eyes: Negative.   Respiratory:       HPI, otherwise negative  Cardiovascular:       HPI, otherwise nmegative  Gastrointestinal: Negative for vomiting.  Genitourinary:       HPI, otherwise negative  Musculoskeletal:       HPI, otherwise negative  Skin: Negative.   Neurological: Negative for syncope.    Allergies  Codeine and Penicillins  Home Medications   Current Outpatient Rx  Name Route Sig Dispense Refill  . ALBUTEROL SULFATE HFA 108 (90 BASE) MCG/ACT IN AERS Inhalation Inhale 2 puffs into the lungs 2 (two) times daily as needed. FOR WHEEZING      . CLONAZEPAM 1 MG PO TABS Oral Take 1  mg by mouth 2 (two) times daily.    Marland Kitchen DOXEPIN HCL 50 MG PO CAPS Oral Take 50 mg by mouth at bedtime.    . DULOXETINE HCL 30 MG PO CPEP Oral Take 30-60 mg by mouth 2 (two) times daily. Take 2 capsules by mouth in the morning and 1 capsule by mouth at night    . FUROSEMIDE 20 MG PO TABS Oral Take 20 mg by mouth daily.    Marland Kitchen GABAPENTIN 300 MG PO CAPS Oral Take 600 mg by mouth 2 (two) times daily.     Marland Kitchen HYDROXYZINE HCL 25 MG PO TABS Oral Take 25 mg by mouth every 6 (six) hours as needed. For itching    . METOCLOPRAMIDE HCL 10 MG PO TABS Oral Take 1 tablet (10 mg total) by mouth once. 15 tablet 0  . OMEPRAZOLE 40 MG PO CPDR Oral Take 1 capsule (40 mg total) by mouth 2 (two) times daily. 60 capsule 0  . OXYCODONE-ACETAMINOPHEN 5-325 MG PO TABS Oral Take 1 tablet by mouth every 4 (four) hours as needed  for pain. 10 tablet 0  . POTASSIUM CHLORIDE CRYS ER 10 MEQ PO TBCR Oral Take 10 mEq by mouth daily.    Marland Kitchen PREDNISONE (PAK) 10 MG PO TABS Oral Take 10 mg by mouth daily. Taper pack. Started on 4/22. Patient unsure of how long for    . PROMETHAZINE HCL 12.5 MG PO TABS Oral Take 12.5 mg by mouth every 6 (six) hours as needed. For nausea/vomiting    . TRAZODONE HCL 50 MG PO TABS Oral Take 75 mg by mouth at bedtime.     . TRIAMCINOLONE ACETONIDE 0.1 % EX CREA Topical Apply 1 application topically 3 (three) times daily as needed. For rash      LMP 02/27/2011  Physical Exam  Nursing note and vitals reviewed. Constitutional: She is oriented to person, place, and time. She appears well-developed and well-nourished. No distress.       Morbidly obese female who is sleeping prior to awakening her for evaluation  HENT:  Head: Normocephalic and atraumatic.  Eyes: Conjunctivae and EOM are normal.  Cardiovascular: Normal rate and regular rhythm.   Pulmonary/Chest: Effort normal and breath sounds normal. No stridor. No respiratory distress.  Abdominal: Soft. She exhibits no distension. There is tenderness. There is no rigidity, no rebound and no guarding.    Musculoskeletal: She exhibits tenderness. She exhibits no edema.  Neurological: She is alert and oriented to person, place, and time. No cranial nerve deficit.  Skin: Skin is warm and dry.  Psychiatric: She has a normal mood and affect.    ED Course  Procedures (including critical care time)   Labs Reviewed  URINALYSIS, ROUTINE W REFLEX MICROSCOPIC  CBC  DIFFERENTIAL  COMPREHENSIVE METABOLIC PANEL   No results found.   No diagnosis found.   6:44 PM Patient is sleeping. MDM  This morbidly obese female presents with weeks of right sided pain.  On initial exam the patient is sleeping, but when awake she states that the pain has been there constantly.  She denies any fevers, chills, nausea, vomiting, diarrhea.  Patient's abdomen is soft,  with mild tenderness about the right lateral abdomen, but none in the inguinal region.  Patient's labs are reassuringly similar to those on multiple prior presentations.  Although the patient notes that she is severe pain, she was sleeping on my repeat evaluation as well.  Given the absence of acute lab abnormalities, vital sign abnormalities, the  patient's sleeping status, she was discharged in stable condition to continue evaluation of her pain with her primary care physician.       [name], MD 05/05/11 1845   7:00 PM Just prior to discharge the patient becomes teary.  She states that she ran out of her medication as prescribed during her recent ED visit.  She has been unable to successfully see her pain management specialist yet.  She is counseled on the need to obtain this consultation.  She was prescribed an additional 10 Percocet and informed that she will not receive additional narcotics from the emergency department.  [name], MD 05/05/11 1900

## 2011-05-05 NOTE — ED Notes (Signed)
To ED for eval of RLQ pain and right flank pain. States she found out last month that she had acute kidney failure, per pt. Pain has been active for a month.

## 2011-05-05 NOTE — Discharge Instructions (Signed)

## 2011-05-10 ENCOUNTER — Encounter: Payer: Self-pay | Admitting: Internal Medicine

## 2011-05-10 ENCOUNTER — Ambulatory Visit (INDEPENDENT_AMBULATORY_CARE_PROVIDER_SITE_OTHER): Payer: Medicaid Other | Admitting: Internal Medicine

## 2011-05-10 VITALS — BP 100/74 | HR 76 | Ht 68.5 in | Wt 291.0 lb

## 2011-05-10 DIAGNOSIS — K5900 Constipation, unspecified: Secondary | ICD-10-CM

## 2011-05-10 DIAGNOSIS — R1084 Generalized abdominal pain: Secondary | ICD-10-CM

## 2011-05-10 DIAGNOSIS — K2270 Barrett's esophagus without dysplasia: Secondary | ICD-10-CM

## 2011-05-10 DIAGNOSIS — K219 Gastro-esophageal reflux disease without esophagitis: Secondary | ICD-10-CM

## 2011-05-10 DIAGNOSIS — R198 Other specified symptoms and signs involving the digestive system and abdomen: Secondary | ICD-10-CM

## 2011-05-10 MED ORDER — POLYETHYLENE GLYCOL 3350 17 GM/SCOOP PO POWD: 17.0000 g | Freq: Every day | ORAL | Status: DC

## 2011-05-10 MED ORDER — PEG-KCL-NACL-NASULF-NA ASC-C 100 G PO SOLR: 1.0000 | Freq: Once | ORAL | Status: DC

## 2011-05-10 NOTE — Patient Instructions (Addendum)
You have been scheduled for an endoscopy and colonoscopy with propofol. Please follow the written instructions given to you at your visit today. Please pick up your prep at the pharmacy within the next 1-3 days.  We have sent the following medications to your pharmacy for you to pick up at your convenience:

## 2011-05-10 NOTE — Progress Notes (Signed)
HISTORY OF PRESENT ILLNESS:  Michelle [name] is a 46 y.o. female with multiple medical problems as listed below. She presents today for post hospital followup. She was seen for nausea with vomiting and diarrhea. She underwent upper endoscopy and was found to have Barrett's esophagus, gastroparesis. Biopsies revealed Barrett's esophagus without dysplasia. Followup in 1 year recommended. We sent her a letter regarding this. She received that letter. We discussed Barrett's esophagus. She presents today with her daughter. She states that her reflux symptoms are much better on PPI. No nausea or vomiting. No dysphagia. She does complain of several weeks or months worth of constipation as well as lower abdominal discomfort. She has had 3 CAT scans in the past 3 months without acute abdominal findings. She'll that she has a history of irritable bowel syndrome and underwent colonoscopy in 1994. She was to have her colon checked again. She is concerned about change in bowel habits and lower abdominal pain. She cannot identify any factor is that improve or relieve her pain. She states that defecation both improves and exacerbates her pain. She requests pain medication. She also states that she quit smoking a few weeks ago. As well, she is trying to lose weight. She is accompanied today by her daughter. Her only other complaint is increased gas with bloating.  REVIEW OF SYSTEMS:  All non-GI ROS negative except for anxiety, arthritis, back pain, depression, fatigue, headaches, itching, skin rash  Past Medical History  Diagnosis Date  . Asthma   . GERD (gastroesophageal reflux disease)   . Peptic ulcer disease 1984  . Episodic tension type headache   . Arthritis     hips, knees and spine  . Anxiety   . Depression   . Carpal tunnel syndrome of left wrist   . Barrett's esophagus   . Gastroparesis   . DM (diabetes mellitus)   . IBS (irritable bowel syndrome)     Past Surgical History  Procedure Date  .  Cervical fusion 2000    x2  . Joint replacement 2009    left knee x3  . Back surgery 2008    lumbar  fusion  . Cholecystectomy 1991  . Carpal tunnel release 2011    right hand  . Carpal tunnel release 11/23/2010    Procedure: CARPAL TUNNEL RELEASE;  Surgeon: Cammy Copa;  Location: [facility] OR;  Service: Orthopedics;  Laterality: Left;  revision left carpal tunnel release neurolysis of median nerve  . Esophagogastroduodenoscopy 03/20/2011    Procedure: ESOPHAGOGASTRODUODENOSCOPY (EGD);  Surgeon: Hilarie Fredrickson, MD;  Location: [facility] ENDOSCOPY;  Service: Endoscopy;  Laterality: N/A;  [place] Dec /ja  . Knee arthroscopy     right    Social History Michelle [name]  reports that she quit smoking 12 days ago. Her smoking use included Cigarettes. She has a 15 pack-year smoking history. She has never used smokeless tobacco. She reports that she does not drink alcohol or use illicit drugs.  family history includes Clotting disorder in her daughter and Esophageal cancer in her father.  Allergies  Allergen Reactions  . Codeine Itching  . Penicillins Itching and Nausea And Vomiting    REACTION: Rash/Itchy       PHYSICAL EXAMINATION:  Vital signs: BP 100/74  Pulse 76  Ht 5' 8.5" (1.74 m)  Wt 291 lb (131.997 kg)  BMI 43.60 kg/m2  LMP 02/27/2011 General: obese,Well-developed, well-nourished, no acute distress HEENT: Sclerae are anicteric, conjunctiva pink. Oral mucosa intact Lungs: Clear Heart: Regular Abdomen: soft,obese diffuse  muscle wall tenderness with minimal palpation, nondistended, no obvious ascites, no peritoneal signs, normal bowel sounds. No organomegaly. Extremities: No edema Psychiatric: alert and oriented x3. Cooperative   ASSESSMENT:  #1. GERD with Barrett's esophagus. Symptoms improved on PPI #2. Gastroparesis #3. Recent problems with constipation and lower abdominal discomfort. #4. Muscle wall tenderness of the abdomen   PLAN:  #1. Continue PPI #2. Reflux  precautions, with attention to weight loss #3. Repeat surveillance endoscopy in 1 year #4. Schedule colonoscopy to evaluate change in bowel habits and lower common discomfort.The nature of the procedure, as well as the risks, benefits, and alternatives were carefully and thoroughly reviewed with the patient. Ample time for discussion and questions allowed. The patient understood, was satisfied, and agreed to proceed. The prep was prescribed. The patient instructed on its use. The patient is high-risk given her comorbidities and body habitus. Recommend CRNA monitor propofol for sedation #5. Patient's "pain medications" requests denied

## 2011-05-14 ENCOUNTER — Encounter (HOSPITAL_COMMUNITY): Payer: Self-pay | Admitting: Nurse Practitioner

## 2011-05-14 ENCOUNTER — Emergency Department (HOSPITAL_COMMUNITY): Payer: Medicaid Other

## 2011-05-14 ENCOUNTER — Emergency Department (HOSPITAL_COMMUNITY)
Admission: EM | Admit: 2011-05-14 | Discharge: 2011-05-14 | Disposition: A | Discharge status: Home / Self Care | Payer: Medicaid Other | Attending: Emergency Medicine | Admitting: Emergency Medicine

## 2011-05-14 DIAGNOSIS — E119 Type 2 diabetes mellitus without complications: Secondary | ICD-10-CM | POA: Insufficient documentation

## 2011-05-14 DIAGNOSIS — K219 Gastro-esophageal reflux disease without esophagitis: Secondary | ICD-10-CM | POA: Insufficient documentation

## 2011-05-14 DIAGNOSIS — K589 Irritable bowel syndrome without diarrhea: Secondary | ICD-10-CM | POA: Insufficient documentation

## 2011-05-14 DIAGNOSIS — M549 Dorsalgia, unspecified: Secondary | ICD-10-CM | POA: Insufficient documentation

## 2011-05-14 DIAGNOSIS — R51 Headache: Secondary | ICD-10-CM | POA: Insufficient documentation

## 2011-05-14 DIAGNOSIS — J45909 Unspecified asthma, uncomplicated: Secondary | ICD-10-CM | POA: Insufficient documentation

## 2011-05-14 DIAGNOSIS — W108XXA Fall (on) (from) other stairs and steps, initial encounter: Secondary | ICD-10-CM | POA: Insufficient documentation

## 2011-05-14 DIAGNOSIS — Z981 Arthrodesis status: Secondary | ICD-10-CM | POA: Insufficient documentation

## 2011-05-14 DIAGNOSIS — M533 Sacrococcygeal disorders, not elsewhere classified: Secondary | ICD-10-CM | POA: Insufficient documentation

## 2011-05-14 DIAGNOSIS — S3210XA Unspecified fracture of sacrum, initial encounter for closed fracture: Secondary | ICD-10-CM | POA: Insufficient documentation

## 2011-05-14 MED ORDER — OXYCODONE-ACETAMINOPHEN 5-325 MG PO TABS
2.0000 | ORAL_TABLET | Freq: Once | ORAL | Status: AC
  Administered 2011-05-14: 2 via ORAL
  Filled 2011-05-14: qty 2

## 2011-05-14 MED ORDER — OXYCODONE-ACETAMINOPHEN 5-325 MG PO TABS: 2.0000 | ORAL_TABLET | Freq: Once | ORAL | Status: AC

## 2011-05-14 NOTE — ED Notes (Signed)
States she was going down steps in home last night, got to the bottom step and fell onto concrete floor. States headache, lower back pain, L elbow and L knee pain since. Abrasion with no bleeding to L elbow and knee. Pt reports history of back surgery and back pain. Pt tearful, MAE, a&Ox4.

## 2011-05-14 NOTE — ED Notes (Signed)
Pt from xray

## 2011-05-14 NOTE — Discharge Instructions (Signed)
Tailbone Injury  The tailbone is the small bone at the lower end of the backbone (spine). You may have stretched tissues, bruises, or a broken bone (fracture). Most tailbone injuries get better on their own after 4 to 6 weeks.  HOME CARE  · Put ice on the injured area.  · Put ice in a plastic bag.  · Place a towel between your skin and the bag.  · Leave the ice on for 15 to 20 minutes. Do this every hour while you are awake for 1 to 2 days.  · Sit on a large, rubber or inflated ring or cushion to lessen pain. Lean forward when you sit to help lessen pain.  · Avoid sitting in one place for a long time.  · Increase your activity as the pain allows.  · Only take medicines as told by your doctor.  · You can take medicine to help you poop (stool softeners) if it is painful to poop.  · Eat foods with plenty of fiber.  · Keep all doctor visits as told.  GET HELP RIGHT AWAY IF:  · Your pain gets worse.  · Pooping causes you pain.  · You cannot poop (constipation).  · You have a fever.  MAKE SURE YOU:  · Understand these instructions.  · Will watch your condition.  · Will get help right away if you are not doing well or get worse.  Document Released: 01/30/2010 Document Revised: 12/17/2010 Document Reviewed: 07/23/2010  [employer]® Patient Information ©2012 [employer].

## 2011-05-14 NOTE — ED Notes (Signed)
Patient transported to X-ray 

## 2011-05-14 NOTE — ED Provider Notes (Signed)
History   This chart was scribed for Nelia Shi, MD by Brooks Sailors. The patient was seen in room STRE5/STRE5. Patient's care was started at 1612.   CSN: [id number]  Arrival date & time 05/14/11  1612   None     Chief Complaint  Patient presents with  . Fall    (Consider location/radiation/quality/duration/timing/severity/associated sxs/prior treatment) HPI  Michelle [name] is a 46 y.o. female who presents to the Emergency Department complaining of constant back pain localized to the coccyx region from a fall that occurred last night about 10 hours ago. Patient was walking down stairs when she missed a step and fell hitting back and head. Patient has associated headache. Patient with history of lumbar fusion in 2008.   Past Medical History  Diagnosis Date  . Asthma   . GERD (gastroesophageal reflux disease)   . Peptic ulcer disease 1984  . Episodic tension type headache   . Arthritis     hips, knees and spine  . Anxiety   . Depression   . Carpal tunnel syndrome of left wrist   . Barrett's esophagus   . Gastroparesis   . DM (diabetes mellitus)   . IBS (irritable bowel syndrome)     Past Surgical History  Procedure Date  . Cervical fusion 2000    x2  . Joint replacement 2009    left knee x3  . Back surgery 2008    lumbar  fusion  . Cholecystectomy 1991  . Carpal tunnel release 2011    right hand  . Carpal tunnel release 11/23/2010    Procedure: CARPAL TUNNEL RELEASE;  Surgeon: Cammy Copa;  Location: [facility] OR;  Service: Orthopedics;  Laterality: Left;  revision left carpal tunnel release neurolysis of median nerve  . Esophagogastroduodenoscopy 03/20/2011    Procedure: ESOPHAGOGASTRODUODENOSCOPY (EGD);  Surgeon: Hilarie Fredrickson, MD;  Location: [facility] ENDOSCOPY;  Service: Endoscopy;  Laterality: N/A;  [place] Dec /ja  . Knee arthroscopy     right    Family History  Problem Relation Age of Onset  . Esophageal cancer Father   . Clotting disorder Daughter      History  Substance Use Topics  . Smoking status: Former Smoker -- 0.5 packs/day for 30 years    Types: Cigarettes    Quit date: 04/28/2011  . Smokeless tobacco: Never Used  . Alcohol Use: No    OB History    Grav Para Term Preterm Abortions TAB SAB Ect Mult Living                  Review of Systems  All other systems reviewed and are negative.    Allergies  Codeine and Penicillins  Home Medications   Current Outpatient Rx  Name Route Sig Dispense Refill  . ALBUTEROL SULFATE HFA 108 (90 BASE) MCG/ACT IN AERS Inhalation Inhale 2 puffs into the lungs 2 (two) times daily as needed. FOR WHEEZING      . CLONAZEPAM 1 MG PO TABS Oral Take 1 mg by mouth 2 (two) times daily.    Marland Kitchen DOXEPIN HCL 50 MG PO CAPS Oral Take 50 mg by mouth at bedtime.    . DULOXETINE HCL 30 MG PO CPEP Oral Take 30-60 mg by mouth 2 (two) times daily. Take 2 capsules by mouth in the morning and 1 capsule by mouth at night    . GABAPENTIN 300 MG PO CAPS Oral Take 600 mg by mouth 2 (two) times daily.     Marland Kitchen  HYDROXYZINE HCL 25 MG PO TABS Oral Take 25 mg by mouth every 6 (six) hours as needed. For itching    . OMEPRAZOLE 40 MG PO CPDR Oral Take 1 capsule (40 mg total) by mouth 2 (two) times daily. 60 capsule 0  . POLYETHYLENE GLYCOL 3350 PO POWD Oral Take 17 g by mouth daily. 527 g 3  . PROMETHAZINE HCL 12.5 MG PO TABS Oral Take 12.5 mg by mouth every 6 (six) hours as needed. For nausea/vomiting    . TRAZODONE HCL 50 MG PO TABS Oral Take 75 mg by mouth at bedtime.     . TRIAMCINOLONE ACETONIDE 0.1 % EX CREA Topical Apply 1 application topically 3 (three) times daily as needed. For rash    . METOCLOPRAMIDE HCL 10 MG PO TABS Oral Take 1 tablet (10 mg total) by mouth once. 15 tablet 0  . OXYCODONE-ACETAMINOPHEN 5-325 MG PO TABS Oral Take 2 tablets by mouth once. 30 tablet 0  . PEG-KCL-NACL-NASULF-NA ASC-C 100 G PO SOLR Oral Take 1 kit (100 g total) by mouth once. 1 kit 0    BP 115/82  Pulse 88  Temp(Src) 98.4  F (36.9 C) (Oral)  Resp 20  Ht 5\' 8"  (1.727 m)  Wt 273 lb (123.832 kg)  BMI 41.51 kg/m2  SpO2 96%  LMP 02/27/2011  Physical Exam  Nursing note and vitals reviewed. Constitutional: She is oriented to person, place, and time. She appears well-developed and well-nourished. No distress.  HENT:  Head: Normocephalic and atraumatic.  Eyes: EOM are normal.  Neck: Neck supple. No tracheal deviation present.  Cardiovascular: Normal rate.   Pulmonary/Chest: Effort normal. No respiratory distress.  Musculoskeletal: Normal range of motion.       Lumbar back: She exhibits pain.       Back:  Neurological: She is alert and oriented to person, place, and time.  Skin: Skin is warm and dry.  Psychiatric: She has a normal mood and affect. Her behavior is normal.    ED Course  Procedures (including critical care time) DIAGNOSTIC STUDIES: Oxygen Saturation is 96% on room air, adequate by my interpretation.    COORDINATION OF CARE: 4:32PM Patient informed of current plan for treatment and evaluation and agrees with plan at this time.     Labs Reviewed - No data to display Dg Lumbar Spine 2-3 Views  05/14/2011  *RADIOLOGY REPORT*  Clinical Data: Larey Seat.  Back pain.  LUMBAR SPINE - 2-3 VIEW  Comparison: CT scan 04/27/2011.  Findings: Stable appearing anterior and interbody fusion device at L5-S1.  The lumbar vertebral bodies normally aligned.  Disc spaces are maintained.  No acute fracture.  IMPRESSION: Normal alignment and no acute bony findings.  Stable anterior and interbody fusion device at L5-S1.  Original Report Authenticated By: P. Loralie Champagne, M.D.   Dg Sacrum/coccyx  05/14/2011  *RADIOLOGY REPORT*  Clinical Data: Larey Seat.  Sacral pain.  SACRUM AND COCCYX - 2+ VIEW  Comparison: CT scan 04/27/2011.  Findings: Surgical changes noted from previous anterior and interbody fusion at L5-S1.  There is a slightly irregular deformity involving the mid distal sacrum which could reflect a sacral fracture.   The pubic symphysis and SI joints are intact.  IMPRESSION:  Mid distal sacral deformity could reflect a sacral fracture.  Original Report Authenticated By: P. Loralie Champagne, M.D.   Ct Head Wo Contrast  05/14/2011  *RADIOLOGY REPORT*  Clinical Data: Frontal headaches, dizziness, and blurred vision after a fall yesterday.  CT HEAD WITHOUT CONTRAST  05/14/2011:  Technique:  Contiguous axial images were obtained from the base of the skull through the vertex without contrast.  Comparison: None.  Findings: Ventricular system normal in size and appearance for age. No mass lesion.  No midline shift.  No acute hemorrhage or hematoma.  No extra-axial fluid collections.  No evidence of acute infarction.  No focal brain parenchymal abnormalities.  No skull fractures or other focal osseous abnormalities involving the skull.  Visualized paranasal sinuses, mastoid air cells, and middle ear cavities well-aerated.  Note is made of a bony nasal septal deviation to the right.  IMPRESSION: Normal unenhanced cranial CT.  Original Report Authenticated By: Arnell Sieving, M.D.     1. Sacrum and coccyx fracture       MDM    I personally performed the services described in this documentation, which was scribed in my presence. The recorded information has been reviewed and considered.       Nelia Shi, MD 05/14/11 [id number]

## 2011-05-14 NOTE — ED Notes (Signed)
Pt requesting pain meds and "something to relax" her.  MD notified.

## 2011-06-14 ENCOUNTER — Ambulatory Visit (AMBULATORY_SURGERY_CENTER): Payer: Medicaid Other | Admitting: Internal Medicine

## 2011-06-14 ENCOUNTER — Encounter: Payer: Medicaid Other | Admitting: Internal Medicine

## 2011-06-14 ENCOUNTER — Encounter: Payer: Self-pay | Admitting: Internal Medicine

## 2011-06-14 VITALS — BP 115/73 | HR 78 | Temp 98.6°F | Resp 20 | Ht 68.5 in | Wt 291.0 lb

## 2011-06-14 DIAGNOSIS — K621 Rectal polyp: Secondary | ICD-10-CM

## 2011-06-14 DIAGNOSIS — D126 Benign neoplasm of colon, unspecified: Secondary | ICD-10-CM

## 2011-06-14 DIAGNOSIS — Z1211 Encounter for screening for malignant neoplasm of colon: Secondary | ICD-10-CM

## 2011-06-14 DIAGNOSIS — R198 Other specified symptoms and signs involving the digestive system and abdomen: Secondary | ICD-10-CM

## 2011-06-14 DIAGNOSIS — R1084 Generalized abdominal pain: Secondary | ICD-10-CM

## 2011-06-14 DIAGNOSIS — K620 Anal polyp: Secondary | ICD-10-CM

## 2011-06-14 HISTORY — PX: COLONOSCOPY W/ POLYPECTOMY: SHX1380

## 2011-06-14 LAB — GLUCOSE, CAPILLARY: Glucose-Capillary: 89 mg/dL (ref 70–99)

## 2011-06-14 MED ORDER — SODIUM CHLORIDE 0.9 % IV SOLN: 500.0000 mL | INTRAVENOUS | Status: DC

## 2011-06-14 NOTE — Patient Instructions (Signed)

## 2011-06-14 NOTE — Op Note (Signed)
[facility] [address] [place], [place]  [address]  COLONOSCOPY PROCEDURE REPORT  PATIENT:  Michelle [name], Michelle [name]  MR#:  [id number] BIRTHDATE:  30-Jul-1965, 46 yrs. old  GENDER:  female ENDOSCOPIST:  Wilhemina Bonito. Eda Keys, MD REF. BY:  Office PROCEDURE DATE:  06/14/2011 PROCEDURE:  Colonoscopy with snare polypectomy x 3 ASA CLASS:  Class III INDICATIONS:  Screening, change in bowel habits, Abdominal pain MEDICATIONS:   MAC sedation, administered by CRNA, propofol (Diprivan) 280 mg IV  DESCRIPTION OF PROCEDURE:   After the risks benefits and alternatives of the procedure were thoroughly explained, informed consent was obtained.  Digital rectal exam was performed and revealed no abnormalities.   The LB CF-[id number] endoscope was introduced through the anus and advanced to the cecum, which was identified by both the appendix and ileocecal valve, without limitations.  The quality of the prep was good, using MoviPrep. The instrument was then slowly withdrawn as the colon was fully examined. <<PROCEDUREIMAGES>>  FINDINGS:  Three 3-45mm polyps were found in the rectum and snared without cautery. Retrieval was successful.  Moderate diverticulosis was found in the left colon.  Otherwise normal colonoscopy without other polyps, masses, vascular ectasias, or inflammatory changes.   Retroflexed views in the rectum revealed no abnormalities.    The time to cecum =  2:50  minutes. The scope was then withdrawn in  13:14  minutes from the cecum and the procedure completed. COMPLICATIONS:  None ENDOSCOPIC IMPRESSION: 1) Three tiny polyps in the rectum - removed 2) Moderate diverticulosis in the left colon 3) Otherwise normal colonoscopy  RECOMMENDATIONS: 1) Repeat colonoscopy in 5 years if polyp adenomatous; otherwise 10 years 2) Fiber supplementation daily 3) Return to the care of your primary provider  ______________________________ Wilhemina Bonito. Eda Keys, MD  CC:  The Patient,   Mayford Knife, Dr. MD  n. Rosalie Doctor:   Wilhemina Bonito. Eda Keys at 06/14/2011 02:55 PM  Miguel Rota, [id number]

## 2011-06-14 NOTE — Progress Notes (Signed)
Patient did not experience any of the following events: a burn prior to discharge; a fall within the facility; wrong site/side/patient/procedure/implant event; or a hospital transfer or hospital admission upon discharge from the facility. (G8907) Patient did not have preoperative order for IV antibiotic SSI prophylaxis. (G8918)  

## 2011-06-15 ENCOUNTER — Telehealth: Payer: Self-pay | Admitting: *Deleted

## 2011-06-15 NOTE — Telephone Encounter (Signed)
NO ANSWER

## 2011-06-17 ENCOUNTER — Encounter: Payer: Self-pay | Admitting: Internal Medicine

## 2011-08-03 ENCOUNTER — Encounter: Payer: Medicaid Other | Admitting: Obstetrics and Gynecology

## 2011-08-09 ENCOUNTER — Encounter (HOSPITAL_COMMUNITY): Payer: Self-pay | Admitting: Emergency Medicine

## 2011-08-09 ENCOUNTER — Emergency Department (HOSPITAL_COMMUNITY): Payer: Medicaid Other

## 2011-08-09 ENCOUNTER — Emergency Department (HOSPITAL_COMMUNITY)
Admission: EM | Admit: 2011-08-09 | Discharge: 2011-08-10 | Disposition: A | Discharge status: Home / Self Care | Payer: Medicaid Other | Attending: Emergency Medicine | Admitting: Emergency Medicine

## 2011-08-09 DIAGNOSIS — J45909 Unspecified asthma, uncomplicated: Secondary | ICD-10-CM | POA: Insufficient documentation

## 2011-08-09 DIAGNOSIS — F419 Anxiety disorder, unspecified: Secondary | ICD-10-CM

## 2011-08-09 DIAGNOSIS — K589 Irritable bowel syndrome without diarrhea: Secondary | ICD-10-CM | POA: Insufficient documentation

## 2011-08-09 DIAGNOSIS — R059 Cough, unspecified: Secondary | ICD-10-CM | POA: Insufficient documentation

## 2011-08-09 DIAGNOSIS — K219 Gastro-esophageal reflux disease without esophagitis: Secondary | ICD-10-CM | POA: Insufficient documentation

## 2011-08-09 DIAGNOSIS — E119 Type 2 diabetes mellitus without complications: Secondary | ICD-10-CM | POA: Insufficient documentation

## 2011-08-09 DIAGNOSIS — M129 Arthropathy, unspecified: Secondary | ICD-10-CM | POA: Insufficient documentation

## 2011-08-09 DIAGNOSIS — F410 Panic disorder [episodic paroxysmal anxiety] without agoraphobia: Secondary | ICD-10-CM | POA: Insufficient documentation

## 2011-08-09 DIAGNOSIS — R079 Chest pain, unspecified: Secondary | ICD-10-CM | POA: Insufficient documentation

## 2011-08-09 DIAGNOSIS — R05 Cough: Secondary | ICD-10-CM | POA: Insufficient documentation

## 2011-08-09 MED ORDER — FENTANYL CITRATE 0.05 MG/ML IJ SOLN
50.0000 ug | Freq: Once | INTRAMUSCULAR | Status: AC
  Administered 2011-08-09: 50 ug via INTRAVENOUS
  Filled 2011-08-09: qty 2

## 2011-08-09 MED ORDER — ALPRAZOLAM 0.5 MG PO TABS
1.0000 mg | ORAL_TABLET | Freq: Once | ORAL | Status: AC
  Administered 2011-08-09: 1 mg via ORAL
  Filled 2011-08-09: qty 2

## 2011-08-09 MED ORDER — SODIUM CHLORIDE 0.9 % IV SOLN
Freq: Once | INTRAVENOUS | Status: AC
  Administered 2011-08-09: 22:00:00 via INTRAVENOUS

## 2011-08-09 MED ORDER — MORPHINE SULFATE 4 MG/ML IJ SOLN
4.0000 mg | Freq: Once | INTRAMUSCULAR | Status: AC
  Administered 2011-08-09: 4 mg via INTRAVENOUS
  Filled 2011-08-09: qty 1

## 2011-08-09 NOTE — ED Provider Notes (Addendum)
History     CSN: [id number]  Arrival date & time 08/09/11  1945   First MD Initiated Contact with Patient 08/09/11 2247      Chief Complaint  Patient presents with  . Panic Attack    (Consider location/radiation/quality/duration/timing/severity/associated sxs/prior treatment) The history is provided by the patient and a relative.  Pt c/o depression and anxiety along with cp.  She says she has been under a lot of stress at home.  She and her family have run out of food stamps.  Her daughter as taken to jail this am.  ( now she is released. )  She and her husband had an argument.   She went into her room and knocked all the items of her dresser.   She c/o cough with white and yellow phlegm.  No n/v/f/c. No uti sxs.  No diarrhea.  She denies si/hi.    Past Medical History  Diagnosis Date  . Asthma   . GERD (gastroesophageal reflux disease)   . Peptic ulcer disease 1984  . Episodic tension type headache   . Arthritis     hips, knees and spine  . Anxiety   . Depression   . Carpal tunnel syndrome of left wrist   . Barrett's esophagus   . Gastroparesis   . DM (diabetes mellitus)   . IBS (irritable bowel syndrome)   . Chronic kidney disease     Past Surgical History  Procedure Date  . Cervical fusion 2000    x2  . Joint replacement 2009    left knee x3  . Back surgery 2008    lumbar  fusion  . Cholecystectomy 1991  . Carpal tunnel release 2011    right hand  . Carpal tunnel release 11/23/2010    Procedure: CARPAL TUNNEL RELEASE;  Surgeon: Cammy Copa;  Location: [facility] OR;  Service: Orthopedics;  Laterality: Left;  revision left carpal tunnel release neurolysis of median nerve  . Esophagogastroduodenoscopy 03/20/2011    Procedure: ESOPHAGOGASTRODUODENOSCOPY (EGD);  Surgeon: Hilarie Fredrickson, MD;  Location: [facility] ENDOSCOPY;  Service: Endoscopy;  Laterality: N/A;  [place] Dec /ja  . Knee arthroscopy     right    Family History  Problem Relation Age of Onset  . Esophageal cancer  Father   . Clotting disorder Daughter     History  Substance Use Topics  . Smoking status: Former Smoker -- 0.5 packs/day for 30 years    Types: Cigarettes    Quit date: 04/28/2011  . Smokeless tobacco: Never Used  . Alcohol Use: No    OB History    Grav Para Term Preterm Abortions TAB SAB Ect Mult Living                  Review of Systems  Constitutional: Negative for fever and chills.  HENT: Negative for neck pain.   Respiratory: Positive for cough and shortness of breath.   Cardiovascular: Positive for chest pain. Negative for leg swelling.  Gastrointestinal: Negative for nausea, vomiting and abdominal pain.  Neurological: Negative for headaches.  Psychiatric/Behavioral: Negative for suicidal ideas, confusion and self-injury.  All other systems reviewed and are negative.    Allergies  Codeine and Penicillins  Home Medications   Current Outpatient Rx  Name Route Sig Dispense Refill  . ALBUTEROL SULFATE HFA 108 (90 BASE) MCG/ACT IN AERS Inhalation Inhale 2 puffs into the lungs 2 (two) times daily as needed. FOR WHEEZING      . CLONAZEPAM 1 MG PO TABS  Oral Take 1 mg by mouth 2 (two) times daily.    Marland Kitchen DOXEPIN HCL 50 MG PO CAPS Oral Take 50 mg by mouth at bedtime.    . DULOXETINE HCL 30 MG PO CPEP Oral Take 30-60 mg by mouth 2 (two) times daily. Take 2 capsules by mouth in the morning and 1 capsule by mouth at night    . GABAPENTIN 300 MG PO CAPS Oral Take 600 mg by mouth 2 (two) times daily.     Marland Kitchen HYDROXYZINE HCL 25 MG PO TABS Oral Take 25 mg by mouth every 6 (six) hours as needed. For itching    . OMEPRAZOLE 40 MG PO CPDR Oral Take 1 capsule (40 mg total) by mouth 2 (two) times daily. 60 capsule 0  . OXYCODONE-ACETAMINOPHEN 10-325 MG PO TABS Oral Take 1 tablet by mouth every 6 (six) hours as needed. For pain.    Marland Kitchen POLYETHYLENE GLYCOL 3350 PO PACK Oral Take 17 g by mouth daily as needed. For constipation.    Marland Kitchen PROMETHAZINE HCL 12.5 MG PO TABS Oral Take 12.5 mg by mouth  every 6 (six) hours as needed. For nausea/vomiting    . TRAMADOL HCL 50 MG PO TABS Oral Take 50 mg by mouth every 6 (six) hours as needed. For pain.    . TRAZODONE HCL 50 MG PO TABS Oral Take 75 mg by mouth at bedtime.     . TRIAMCINOLONE ACETONIDE 0.1 % EX CREA Topical Apply 1 application topically 3 (three) times daily as needed. For rash      BP 128/80  Pulse 71  Temp 97.8 F (36.6 C) (Oral)  Resp 24  Ht 5\' 10"  (1.778 m)  Wt 245 lb (111.131 kg)  BMI 35.15 kg/m2  SpO2 94%  Physical Exam  Nursing note and vitals reviewed. Constitutional: She is oriented to person, place, and time. No distress.       Morbidly obese  HENT:  Head: Normocephalic and atraumatic.  Eyes: Conjunctivae are normal.  Neck: Normal range of motion. Neck supple.  Cardiovascular: Normal rate.   No murmur heard. Pulmonary/Chest: Effort normal and breath sounds normal. No respiratory distress.  Abdominal: Soft. Bowel sounds are normal.  Musculoskeletal: Normal range of motion.  Neurological: She is alert and oriented to person, place, and time.  Skin: Skin is warm and dry.  Psychiatric: Her behavior is normal. Judgment and thought content normal.    ED Course  Procedures (including critical care time)  Labs Reviewed - No data to display No results found.   No diagnosis found.    Rate: 63  Rhythm: normal sinus rhythm  QRS Axis: normal  Intervals: normal  ST/T Wave abnormalities: normal  Conduction Disutrbances: none  Narrative Interpretation: unremarkable     MDM  Anxiety attack        Cheri Guppy, MD 08/09/11 2440  Cheri Guppy, MD 08/10/11 1027  Cheri Guppy, MD 09/04/11 [phone]

## 2011-08-09 NOTE — ED Notes (Signed)
Per EMS: pt coming from home with c/o panic attack. Pt has a hx of panic attacks that has chest pain with the attacks that last three days. Pt states "pain is sharpe and dull on the left side." pt denies radiation or provocation.

## 2011-08-10 MED ORDER — TRAMADOL HCL 50 MG PO TABS: 50.0000 mg | ORAL_TABLET | Freq: Four times a day (QID) | ORAL | Status: AC | PRN

## 2011-08-10 MED ORDER — ALPRAZOLAM 0.5 MG PO TABS: 0.5000 mg | ORAL_TABLET | Freq: Every evening | ORAL | Status: DC | PRN

## 2011-08-22 ENCOUNTER — Encounter (HOSPITAL_COMMUNITY): Payer: Self-pay

## 2011-08-22 ENCOUNTER — Emergency Department (HOSPITAL_COMMUNITY)
Admission: EM | Admit: 2011-08-22 | Discharge: 2011-08-22 | Disposition: A | Discharge status: Home / Self Care | Payer: Medicaid Other | Attending: Emergency Medicine | Admitting: Emergency Medicine

## 2011-08-22 ENCOUNTER — Emergency Department (HOSPITAL_COMMUNITY): Payer: Medicaid Other

## 2011-08-22 DIAGNOSIS — Z9089 Acquired absence of other organs: Secondary | ICD-10-CM | POA: Insufficient documentation

## 2011-08-22 DIAGNOSIS — Z8739 Personal history of other diseases of the musculoskeletal system and connective tissue: Secondary | ICD-10-CM | POA: Insufficient documentation

## 2011-08-22 DIAGNOSIS — J45909 Unspecified asthma, uncomplicated: Secondary | ICD-10-CM | POA: Insufficient documentation

## 2011-08-22 DIAGNOSIS — X500XXA Overexertion from strenuous movement or load, initial encounter: Secondary | ICD-10-CM | POA: Insufficient documentation

## 2011-08-22 DIAGNOSIS — F341 Dysthymic disorder: Secondary | ICD-10-CM | POA: Insufficient documentation

## 2011-08-22 DIAGNOSIS — Z981 Arthrodesis status: Secondary | ICD-10-CM | POA: Insufficient documentation

## 2011-08-22 DIAGNOSIS — N189 Chronic kidney disease, unspecified: Secondary | ICD-10-CM | POA: Insufficient documentation

## 2011-08-22 DIAGNOSIS — S62123A Displaced fracture of lunate [semilunar], unspecified wrist, initial encounter for closed fracture: Secondary | ICD-10-CM | POA: Insufficient documentation

## 2011-08-22 DIAGNOSIS — K219 Gastro-esophageal reflux disease without esophagitis: Secondary | ICD-10-CM | POA: Insufficient documentation

## 2011-08-22 DIAGNOSIS — Z87891 Personal history of nicotine dependence: Secondary | ICD-10-CM | POA: Insufficient documentation

## 2011-08-22 DIAGNOSIS — Z79899 Other long term (current) drug therapy: Secondary | ICD-10-CM | POA: Insufficient documentation

## 2011-08-22 DIAGNOSIS — E119 Type 2 diabetes mellitus without complications: Secondary | ICD-10-CM | POA: Insufficient documentation

## 2011-08-22 MED ORDER — OXYCODONE-ACETAMINOPHEN 5-325 MG PO TABS: 1.0000 | ORAL_TABLET | Freq: Three times a day (TID) | ORAL | Status: AC | PRN

## 2011-08-22 MED ORDER — OXYCODONE-ACETAMINOPHEN 5-325 MG PO TABS
1.0000 | ORAL_TABLET | Freq: Once | ORAL | Status: AC
  Administered 2011-08-22: 1 via ORAL
  Filled 2011-08-22: qty 1

## 2011-08-22 NOTE — ED Notes (Signed)
Ortho tech at bedside and aware of orders

## 2011-08-22 NOTE — ED Provider Notes (Signed)
History  Scribed for Michelle Munch, MD, the patient was seen in room TR04C/TR04C. This chart was scribed by [name]. The patient's care started at 5:43 PM   CSN: [id number]  Arrival date & time 08/22/11  1554   First MD Initiated Contact with Patient 08/22/11 1718      Chief Complaint  Patient presents with  . Wrist Pain     The history is provided by the patient.   Michelle [name] is a 46 y.o. female who presents to the Emergency Department complaining of  left wrist pain that started about one month ago and became worse last night after bending her wrist backwards.  Pt has h/o carpal tunnel with carpal tunnel release surgery performed in 2012.  Nothing makes the pain better or worse.     Past Medical History  Diagnosis Date  . Asthma   . GERD (gastroesophageal reflux disease)   . Peptic ulcer disease 1984  . Episodic tension type headache   . Arthritis     hips, knees and spine  . Anxiety   . Depression   . Carpal tunnel syndrome of left wrist   . Barrett's esophagus   . Gastroparesis   . DM (diabetes mellitus)   . IBS (irritable bowel syndrome)   . Chronic kidney disease     Past Surgical History  Procedure Date  . Cervical fusion 2000    x2  . Joint replacement 2009    left knee x3  . Back surgery 2008    lumbar  fusion  . Cholecystectomy 1991  . Carpal tunnel release 2011    right hand  . Carpal tunnel release 11/23/2010    Procedure: CARPAL TUNNEL RELEASE;  Surgeon: Cammy Copa;  Location: [facility] OR;  Service: Orthopedics;  Laterality: Left;  revision left carpal tunnel release neurolysis of median nerve  . Esophagogastroduodenoscopy 03/20/2011    Procedure: ESOPHAGOGASTRODUODENOSCOPY (EGD);  Surgeon: Hilarie Fredrickson, MD;  Location: [facility] ENDOSCOPY;  Service: Endoscopy;  Laterality: N/A;  [place] Dec /ja  . Knee arthroscopy     right    Family History  Problem Relation Age of Onset  . Esophageal cancer Father   . Clotting disorder Daughter      History  Substance Use Topics  . Smoking status: Former Smoker -- 0.5 packs/day for 30 years    Types: Cigarettes    Quit date: 04/28/2011  . Smokeless tobacco: Never Used  . Alcohol Use: No    OB History    Grav Para Term Preterm Abortions TAB SAB Ect Mult Living                  Review of Systems  Constitutional:       Per HPI, otherwise negative  HENT:       Per HPI, otherwise negative  Eyes: Negative.   Respiratory:       Per HPI, otherwise negative  Cardiovascular:       Per HPI, otherwise negative  Gastrointestinal: Negative for vomiting.  Genitourinary: Negative.   Musculoskeletal: Positive for arthralgias (left wrist pain).       Per HPI, otherwise negative  Skin: Negative.   Neurological: Negative for syncope.    Allergies  Codeine and Penicillins  Home Medications   Current Outpatient Rx  Name Route Sig Dispense Refill  . ALBUTEROL SULFATE HFA 108 (90 BASE) MCG/ACT IN AERS Inhalation Inhale 2 puffs into the lungs 2 (two) times daily as needed. FOR WHEEZING      .  ALPRAZOLAM 0.5 MG PO TABS Oral Take 0.5 mg by mouth at bedtime as needed. For anxiety    . BC HEADACHE POWDER PO Oral Take 1 packet by mouth daily as needed. For pain/headache    . CLONAZEPAM 1 MG PO TABS Oral Take 1 mg by mouth 2 (two) times daily.    Marland Kitchen DOXEPIN HCL 50 MG PO CAPS Oral Take 50 mg by mouth at bedtime.    . DULOXETINE HCL 30 MG PO CPEP Oral Take 30-60 mg by mouth 2 (two) times daily. Take 2 capsules by mouth in the morning and 1 capsule by mouth at night    . GABAPENTIN 300 MG PO CAPS Oral Take 600 mg by mouth 2 (two) times daily.     Marland Kitchen HYDROXYZINE HCL 25 MG PO TABS Oral Take 25 mg by mouth every 6 (six) hours as needed. For itching    . OMEPRAZOLE 40 MG PO CPDR Oral Take 40 mg by mouth 2 (two) times daily.    . OXYCODONE-ACETAMINOPHEN 10-325 MG PO TABS Oral Take 1 tablet by mouth every 6 (six) hours as needed. For pain.    Marland Kitchen POLYETHYLENE GLYCOL 3350 PO PACK Oral Take 17 g by  mouth daily as needed. For constipation.    Marland Kitchen PROMETHAZINE HCL 12.5 MG PO TABS Oral Take 12.5 mg by mouth every 6 (six) hours as needed. For nausea/vomiting    . TRAMADOL HCL 50 MG PO TABS Oral Take 50 mg by mouth every 6 (six) hours as needed. For pain.    . TRAZODONE HCL 50 MG PO TABS Oral Take 75 mg by mouth at bedtime.     . TRIAMCINOLONE ACETONIDE 0.1 % EX CREA Topical Apply 1 application topically 3 (three) times daily as needed. For rash      BP 120/69  Pulse 71  Temp 98.1 F (36.7 C) (Oral)  Resp 18  SpO2 97%  Physical Exam  Nursing note and vitals reviewed. Constitutional: She is oriented to person, place, and time. She appears well-developed and well-nourished. No distress.  HENT:  Head: Normocephalic and atraumatic.  Eyes: Conjunctivae and EOM are normal.  Cardiovascular: Normal rate and regular rhythm.   Pulmonary/Chest: Effort normal and breath sounds normal. No stridor. No respiratory distress.  Abdominal: She exhibits no distension.  Musculoskeletal:       Left Wrist: Distal radius tenderness of the left wrist.  Mild snuff box tenderness.  More pain with flexion that extension.  More pain with adduction that abduction.  Good grip strength.   Neurological: She is alert and oriented to person, place, and time. No cranial nerve deficit.  Skin: Skin is warm and dry.  Psychiatric: She has a normal mood and affect.    ED Course  ORTHOPEDIC INJURY TREATMENT Date/Time: 08/22/2011 5:00 PM Performed by: Michelle [name] Authorized by: Michelle [name] Consent: Verbal consent obtained. Written consent not obtained. The procedure was performed in an emergent situation. Risks and benefits: risks, benefits and alternatives were discussed Consent given by: patient Patient understanding: patient states understanding of the procedure being performed Patient identity confirmed: verbally with patient Time out: Immediately prior to procedure a "time out" was called to verify the  correct patient, procedure, equipment, support staff and site/side marked as required. Injury location: wrist Location details: left wrist Injury type: fracture Fracture type: lunate Pre-procedure neurovascular assessment: neurovascularly intact Pre-procedure distal perfusion: normal Pre-procedure neurological function: normal Pre-procedure range of motion: normal Local anesthesia used: no Patient sedated: no Manipulation performed: no  Immobilization: splint Splint type: volar short arm Supplies used: Ortho-Glass Post-procedure neurovascular assessment: post-procedure neurovascularly intact Post-procedure distal perfusion: normal Post-procedure neurological function: normal Post-procedure range of motion comment: non-mobile Patient tolerance: Patient tolerated the procedure well with no immediate complications. Comments: Patient requested splinting     DIAGNOSTIC STUDIES: Oxygen Saturation is 97% on room air, normal by my interpretation.    COORDINATION OF CARE:  1621 Ordered: DG Wrist Complete Left  Labs Reviewed - No data to display Dg Wrist Complete Left  08/22/2011  *RADIOLOGY REPORT*  Clinical Data: 83-month history of left wrist pain, recently worsened.  History of carpal tunnel release in November, 2012.  LEFT WRIST - COMPLETE 3+ VIEW  Comparison: None.  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 Age indeterminate fracture of the lunate along its radial margin. No other fractures.  Well-preserved joint spaces.  IMPRESSION:  1.  Age indeterminate fracture involving the lunate bone. 2.  Ulna minus variant.  Original Report Authenticated By: Arnell Sieving, M.D.     No diagnosis found.    MDM  I personally performed the services described in this documentation, which was scribed in my presence. The recorded information has been reviewed and considered.  This female presents following an awkward twisting of her left wrist, with no persistent  pain, beyond her typical chronic pain.  On exam she is in no distress though she is focal tenderness about the thenar aspect of her hand.  The patient's x-ray demonstrates a likely lunate fracture, though it is not clearly acute.  Given the patient's physical exam findings, the x-ray description of a possible new fracture, she was placed in a splint, discharged with orthopedics followup.  Michelle Munch, MD 08/23/11 1435

## 2011-08-22 NOTE — Progress Notes (Signed)
Orthopedic Tech Progress Note Patient Details:  Michelle [name] 30-Apr-1965 [id number]  Ortho Devices Type of Ortho Device: Ace wrap;Volar splint Ortho Device/Splint Location: (L) UE Ortho Device/Splint Interventions: Application   Jennye Moccasin 08/22/2011, 6:11 PM

## 2011-08-22 NOTE — ED Notes (Signed)
Pt reports (L) wrist pain x2 weeks, pt reports increase pain after bending her wrist back last night. Pt reports carpal tunnel surgery in November 2012

## 2011-09-04 ENCOUNTER — Emergency Department (HOSPITAL_COMMUNITY)
Admission: EM | Admit: 2011-09-04 | Discharge: 2011-09-04 | Disposition: A | Discharge status: Home / Self Care | Payer: Medicaid Other | Attending: Emergency Medicine | Admitting: Emergency Medicine

## 2011-09-04 ENCOUNTER — Encounter (HOSPITAL_COMMUNITY): Payer: Self-pay | Admitting: *Deleted

## 2011-09-04 DIAGNOSIS — Z96659 Presence of unspecified artificial knee joint: Secondary | ICD-10-CM | POA: Insufficient documentation

## 2011-09-04 DIAGNOSIS — K219 Gastro-esophageal reflux disease without esophagitis: Secondary | ICD-10-CM | POA: Insufficient documentation

## 2011-09-04 DIAGNOSIS — E119 Type 2 diabetes mellitus without complications: Secondary | ICD-10-CM | POA: Insufficient documentation

## 2011-09-04 DIAGNOSIS — X58XXXA Exposure to other specified factors, initial encounter: Secondary | ICD-10-CM | POA: Insufficient documentation

## 2011-09-04 DIAGNOSIS — N189 Chronic kidney disease, unspecified: Secondary | ICD-10-CM | POA: Insufficient documentation

## 2011-09-04 DIAGNOSIS — Z79899 Other long term (current) drug therapy: Secondary | ICD-10-CM | POA: Insufficient documentation

## 2011-09-04 DIAGNOSIS — M79609 Pain in unspecified limb: Secondary | ICD-10-CM | POA: Insufficient documentation

## 2011-09-04 DIAGNOSIS — S62123A Displaced fracture of lunate [semilunar], unspecified wrist, initial encounter for closed fracture: Secondary | ICD-10-CM | POA: Insufficient documentation

## 2011-09-04 DIAGNOSIS — M79643 Pain in unspecified hand: Secondary | ICD-10-CM

## 2011-09-04 MED ORDER — KETOROLAC TROMETHAMINE 60 MG/2ML IM SOLN
60.0000 mg | Freq: Once | INTRAMUSCULAR | Status: AC
  Administered 2011-09-04: 60 mg via INTRAMUSCULAR
  Filled 2011-09-04: qty 2

## 2011-09-04 MED ORDER — OXYCODONE-ACETAMINOPHEN 10-325 MG PO TABS: 1.0000 | ORAL_TABLET | ORAL | Status: AC | PRN

## 2011-09-04 NOTE — ED Notes (Signed)
Patient broke her left arm/wrist about two weeks ago.  Patient has fracture on 8/10 and the pain has been increasing

## 2011-09-04 NOTE — ED Provider Notes (Signed)
Medical screening examination/treatment/procedure(s) were performed by non-physician practitioner and as supervising physician I was immediately available for consultation/collaboration.   Lyanne Co, MD 09/04/11 [phone]

## 2011-09-04 NOTE — ED Notes (Signed)
Ortho tech at bedside to apply splint to left wrist.

## 2011-09-04 NOTE — ED Provider Notes (Signed)
History     CSN: [id number]  Arrival date & time 09/04/11  1836   First MD Initiated Contact with Patient 09/04/11 1936      Chief Complaint  Patient presents with  . Arm Pain    (Consider location/radiation/quality/duration/timing/severity/associated sxs/prior treatment) The history is provided by the patient and medical records.   Michelle [name] is a 46 y.o. female presents to the emergency department complaining of continued pain in the left wrist.  Patient states she broke the wrist on 08/21/2011. She was evaluated at [facility] on 08/22/2011 and diagnosed with a lunate fracture.  Patient states she was given pain medicine at that time but has since used at all.  She states that she followed up with her orthopedist as directed and he indicated that he would not to fix the fracture.  Patient is very dissatisfied with the service. She states she has a new orthopedic appointment this coming Friday the need pain medication to make it through. She also states that the wrist splint is very uncomfortable and makes her hand hurt worse.  She denies fever, chills, numbness, tingling, loss of circulation in the hand.    The patient has medical history significant for:  Past Medical History  Diagnosis Date  . Asthma   . GERD (gastroesophageal reflux disease)   . Peptic ulcer disease 1984  . Episodic tension type headache   . Arthritis     hips, knees and spine  . Anxiety   . Depression   . Carpal tunnel syndrome of left wrist   . Barrett's esophagus   . Gastroparesis   . DM (diabetes mellitus)   . IBS (irritable bowel syndrome)   . Chronic kidney disease      Past Surgical History  Procedure Date  . Cervical fusion 2000    x2  . Joint replacement 2009    left knee x3  . Back surgery 2008    lumbar  fusion  . Cholecystectomy 1991  . Carpal tunnel release 2011    right hand  . Carpal tunnel release 11/23/2010    Procedure: CARPAL TUNNEL RELEASE;  Surgeon: Cammy Copa;  Location: [facility] OR;  Service: Orthopedics;  Laterality: Left;  revision left carpal tunnel release neurolysis of median nerve  . Esophagogastroduodenoscopy 03/20/2011    Procedure: ESOPHAGOGASTRODUODENOSCOPY (EGD);  Surgeon: Hilarie Fredrickson, MD;  Location: [facility] ENDOSCOPY;  Service: Endoscopy;  Laterality: N/A;  [place] Dec /ja  . Knee arthroscopy     right    Family History  Problem Relation Age of Onset  . Esophageal cancer Father   . Clotting disorder Daughter     History  Substance Use Topics  . Smoking status: Former Smoker -- 0.5 packs/day for 30 years    Types: Cigarettes    Quit date: 04/28/2011  . Smokeless tobacco: Never Used  . Alcohol Use: No    OB History    Grav Para Term Preterm Abortions TAB SAB Ect Mult Living                  Review of Systems  Constitutional: Negative for fever and fatigue.  Musculoskeletal: Positive for arthralgias (L wrist pain).  Skin: Negative for wound.  Neurological: Negative for weakness and numbness.    Allergies  Codeine and Penicillins  Home Medications   Current Outpatient Rx  Name Route Sig Dispense Refill  . ALBUTEROL SULFATE HFA 108 (90 BASE) MCG/ACT IN AERS Inhalation Inhale 2 puffs into the lungs 2 (  two) times daily as needed. FOR WHEEZING      . ALPRAZOLAM 0.5 MG PO TABS Oral Take 0.5 mg by mouth at bedtime as needed. For anxiety    . BC HEADACHE POWDER PO Oral Take 1 packet by mouth daily as needed. For pain/headache    . CLONAZEPAM 1 MG PO TABS Oral Take 1 mg by mouth 2 (two) times daily.    Marland Kitchen DOXEPIN HCL 50 MG PO CAPS Oral Take 50 mg by mouth at bedtime.    . DULOXETINE HCL 30 MG PO CPEP Oral Take 30-60 mg by mouth 2 (two) times daily. Take 2 capsules by mouth in the morning and 1 capsule by mouth at night    . GABAPENTIN 300 MG PO CAPS Oral Take 600 mg by mouth 2 (two) times daily.     Marland Kitchen HYDROXYZINE HCL 25 MG PO TABS Oral Take 25 mg by mouth every 6 (six) hours as needed. For itching    . OMEPRAZOLE 40 MG PO CPDR Oral  Take 40 mg by mouth 2 (two) times daily.    . OXYCODONE-ACETAMINOPHEN 10-325 MG PO TABS Oral Take 1 tablet by mouth every 6 (six) hours as needed. For pain.    Marland Kitchen PROMETHAZINE HCL 12.5 MG PO TABS Oral Take 12.5 mg by mouth every 6 (six) hours as needed. For nausea/vomiting    . TRAMADOL HCL 50 MG PO TABS Oral Take 50 mg by mouth every 6 (six) hours as needed. For pain.    . TRAZODONE HCL 50 MG PO TABS Oral Take 75 mg by mouth at bedtime.     . TRIAMCINOLONE ACETONIDE 0.1 % EX CREA Topical Apply 1 application topically 3 (three) times daily as needed. For rash    . OXYCODONE-ACETAMINOPHEN 10-325 MG PO TABS Oral Take 1 tablet by mouth every 4 (four) hours as needed for pain. 10 tablet 0    BP 118/56  Pulse 66  Temp 97.9 F (36.6 C) (Oral)  Resp 18  SpO2 97%  Physical Exam  Nursing note and vitals reviewed. Constitutional: She appears well-developed and well-nourished. No distress.  HENT:  Head: Normocephalic and atraumatic.  Eyes: Conjunctivae are normal.  Cardiovascular: Normal rate, regular rhythm, normal heart sounds and intact distal pulses.  Exam reveals no gallop and no friction rub.   No murmur heard.      Capillary refill less than 3 seconds  Pulmonary/Chest: Effort normal and breath sounds normal. No respiratory distress. She has no wheezes.  Musculoskeletal: She exhibits tenderness (to the left snuffbox region). She exhibits no edema.       Arms:      ROM: Decreased secondary to pain - more pain with flexion and extension Distal radius tenderness of the left wrist with palpation.  Mild snuff box tenderness.   Neurological: She is alert. Coordination normal.       Sensation intact Strength: Good grip strength   Skin: Skin is warm and dry. No rash noted. She is not diaphoretic. No erythema.    ED Course  Procedures (including critical care time)  Labs Reviewed - No data to display No results found.   1. Hand pain   2. Lunate fracture, closed       MDM  [name]  L Appleman presents the emergency department with a known lunate fracture.  She is nontoxic appearing.  She is here simply for pain control.  Have given pain control here in the emergency department and will write another prescription.  I've  also contacted orthopedics to create a new splint for the patient.  I discussed with the patient the importance of followup with another orthopedist if she was unhappy with the first. I have also discussed the fact the emergency room and will be unable to fix the bone for her.  I have also discussed reasons to return immediately to the ER.  Patient states understanding.    1. Medications: Percocet 2. Treatment: The medication as described, ice, elevation, use splint 3. Follow Up: With orthopedics as directed         Dierdre Forth, PA-C 09/04/11 2026

## 2011-09-16 ENCOUNTER — Encounter: Payer: Medicaid Other | Admitting: Obstetrics and Gynecology

## 2011-09-16 ENCOUNTER — Ambulatory Visit: Payer: Medicaid Other | Attending: Orthopedic Surgery | Admitting: Occupational Therapy

## 2011-09-23 ENCOUNTER — Encounter: Payer: Medicaid Other | Admitting: Occupational Therapy

## 2011-10-05 ENCOUNTER — Other Ambulatory Visit: Payer: Self-pay | Admitting: Orthopedic Surgery

## 2011-10-05 ENCOUNTER — Encounter (HOSPITAL_BASED_OUTPATIENT_CLINIC_OR_DEPARTMENT_OTHER)
Admission: RE | Disposition: A | Discharge status: Home / Self Care | Payer: Self-pay | Source: Ambulatory Visit | Attending: Orthopedic Surgery

## 2011-10-05 ENCOUNTER — Encounter (HOSPITAL_BASED_OUTPATIENT_CLINIC_OR_DEPARTMENT_OTHER): Payer: Self-pay | Admitting: Anesthesiology

## 2011-10-05 ENCOUNTER — Ambulatory Visit (HOSPITAL_BASED_OUTPATIENT_CLINIC_OR_DEPARTMENT_OTHER): Payer: Medicaid Other | Admitting: Anesthesiology

## 2011-10-05 ENCOUNTER — Encounter (HOSPITAL_BASED_OUTPATIENT_CLINIC_OR_DEPARTMENT_OTHER): Payer: Self-pay | Admitting: Orthopedic Surgery

## 2011-10-05 ENCOUNTER — Ambulatory Visit (HOSPITAL_BASED_OUTPATIENT_CLINIC_OR_DEPARTMENT_OTHER)
Admission: RE | Admit: 2011-10-05 | Discharge: 2011-10-05 | Disposition: A | Discharge status: Home / Self Care | Payer: Medicaid Other | Source: Ambulatory Visit | Attending: Orthopedic Surgery | Admitting: Orthopedic Surgery

## 2011-10-05 DIAGNOSIS — M9380 Other specified osteochondropathies of unspecified site: Secondary | ICD-10-CM | POA: Insufficient documentation

## 2011-10-05 DIAGNOSIS — K219 Gastro-esophageal reflux disease without esophagitis: Secondary | ICD-10-CM | POA: Insufficient documentation

## 2011-10-05 DIAGNOSIS — E119 Type 2 diabetes mellitus without complications: Secondary | ICD-10-CM | POA: Insufficient documentation

## 2011-10-05 DIAGNOSIS — M8708 Idiopathic aseptic necrosis of bone, other site: Secondary | ICD-10-CM | POA: Insufficient documentation

## 2011-10-05 DIAGNOSIS — F319 Bipolar disorder, unspecified: Secondary | ICD-10-CM | POA: Insufficient documentation

## 2011-10-05 DIAGNOSIS — J45909 Unspecified asthma, uncomplicated: Secondary | ICD-10-CM | POA: Insufficient documentation

## 2011-10-05 DIAGNOSIS — F17200 Nicotine dependence, unspecified, uncomplicated: Secondary | ICD-10-CM | POA: Insufficient documentation

## 2011-10-05 DIAGNOSIS — M8560 Other cyst of bone, unspecified site: Secondary | ICD-10-CM | POA: Insufficient documentation

## 2011-10-05 HISTORY — PX: CARPECTOMY: SHX5004

## 2011-10-05 LAB — POCT I-STAT, CHEM 8
BUN: 12 mg/dL (ref 6–23)
Calcium, Ion: 1.21 mmol/L (ref 1.12–1.23)
Chloride: 103 mEq/L (ref 96–112)
Creatinine, Ser: 1.2 mg/dL — ABNORMAL HIGH (ref 0.50–1.10)
Glucose, Bld: 105 mg/dL — ABNORMAL HIGH (ref 70–99)
HCT: 40 % (ref 36.0–46.0)
Hemoglobin: 13.6 g/dL (ref 12.0–15.0)
Potassium: 4 mEq/L (ref 3.5–5.1)
Sodium: 142 mEq/L (ref 135–145)
TCO2: 27 mmol/L (ref 0–100)

## 2011-10-05 LAB — POCT HEMOGLOBIN-HEMACUE: Hemoglobin: 13.7 g/dL (ref 12.0–15.0)

## 2011-10-05 SURGERY — CARPECTOMY
Anesthesia: General | Site: Wrist | Laterality: Left | Wound class: Clean

## 2011-10-05 MED ORDER — DEXAMETHASONE SODIUM PHOSPHATE 4 MG/ML IJ SOLN
INTRAMUSCULAR | Status: DC | PRN
  Administered 2011-10-05 (×2): 4 mg via INTRAVENOUS

## 2011-10-05 MED ORDER — MIDAZOLAM HCL 5 MG/5ML IJ SOLN
INTRAMUSCULAR | Status: DC | PRN
  Administered 2011-10-05: 1 mg via INTRAVENOUS

## 2011-10-05 MED ORDER — OXYCODONE HCL 5 MG PO TABS
5.0000 mg | ORAL_TABLET | Freq: Once | ORAL | Status: AC | PRN
  Administered 2011-10-05: 5 mg via ORAL

## 2011-10-05 MED ORDER — MIDAZOLAM HCL 2 MG/2ML IJ SOLN
2.0000 mg | INTRAMUSCULAR | Status: DC | PRN
  Administered 2011-10-05: 2 mg via INTRAVENOUS

## 2011-10-05 MED ORDER — FENTANYL CITRATE 0.05 MG/ML IJ SOLN: 50.0000 ug | INTRAMUSCULAR | Status: DC | PRN

## 2011-10-05 MED ORDER — LACTATED RINGERS IV SOLN
INTRAVENOUS | Status: DC | PRN
  Administered 2011-10-05 (×2): via INTRAVENOUS

## 2011-10-05 MED ORDER — ACETAMINOPHEN 10 MG/ML IV SOLN
INTRAVENOUS | Status: DC | PRN
  Administered 2011-10-05: 1000 mg via INTRAVENOUS

## 2011-10-05 MED ORDER — HYDROMORPHONE HCL 2 MG PO TABS: ORAL_TABLET | ORAL | Status: DC

## 2011-10-05 MED ORDER — FENTANYL CITRATE 0.05 MG/ML IJ SOLN
INTRAMUSCULAR | Status: DC | PRN
  Administered 2011-10-05 (×2): 50 ug via INTRAVENOUS

## 2011-10-05 MED ORDER — BUPIVACAINE-EPINEPHRINE PF 0.5-1:200000 % IJ SOLN
INTRAMUSCULAR | Status: DC | PRN
  Administered 2011-10-05: 150 mg

## 2011-10-05 MED ORDER — MIDAZOLAM HCL 2 MG/2ML IJ SOLN: 1.0000 mg | INTRAMUSCULAR | Status: DC | PRN

## 2011-10-05 MED ORDER — FENTANYL CITRATE 0.05 MG/ML IJ SOLN
100.0000 ug | INTRAMUSCULAR | Status: DC | PRN
  Administered 2011-10-05: 100 ug via INTRAVENOUS

## 2011-10-05 MED ORDER — PROPOFOL 10 MG/ML IV BOLUS
INTRAVENOUS | Status: DC | PRN
  Administered 2011-10-05: 60 mg via INTRAVENOUS
  Administered 2011-10-05: 260 mg via INTRAVENOUS

## 2011-10-05 MED ORDER — OXYCODONE-ACETAMINOPHEN 10-325 MG PO TABS: ORAL_TABLET | ORAL | Status: DC

## 2011-10-05 MED ORDER — CHLORHEXIDINE GLUCONATE 4 % EX LIQD: 60.0000 mL | Freq: Once | CUTANEOUS | Status: DC

## 2011-10-05 MED ORDER — VANCOMYCIN HCL 1000 MG IV SOLR
1000.0000 mg | INTRAVENOUS | Status: DC | PRN
  Administered 2011-10-05: 1000 mg via INTRAVENOUS

## 2011-10-05 MED ORDER — DROPERIDOL 2.5 MG/ML IJ SOLN: 0.6250 mg | INTRAMUSCULAR | Status: DC | PRN

## 2011-10-05 MED ORDER — HYDROMORPHONE HCL PF 1 MG/ML IJ SOLN
0.2500 mg | INTRAMUSCULAR | Status: DC | PRN
  Administered 2011-10-05 (×3): 0.5 mg via INTRAVENOUS

## 2011-10-05 SURGICAL SUPPLY — 59 items
BANDAGE ELASTIC 4 VELCRO ST LF (GAUZE/BANDAGES/DRESSINGS) ×2 IMPLANT
BANDAGE GAUZE ELAST BULKY 4 IN (GAUZE/BANDAGES/DRESSINGS) ×2 IMPLANT
BLADE ARTHRO LOK 4 BEAVER (BLADE) ×2 IMPLANT
BLADE EAR TYMPAN 2.5 60D BEAV (BLADE) IMPLANT
BLADE MINI RND TIP GREEN BEAV (BLADE) ×2 IMPLANT
BLADE SURG 15 STRL LF DISP TIS (BLADE) ×2 IMPLANT
BLADE SURG 15 STRL SS (BLADE) ×2
BNDG ESMARK 4X9 LF (GAUZE/BANDAGES/DRESSINGS) ×2 IMPLANT
CHLORAPREP W/TINT 26ML (MISCELLANEOUS) ×2 IMPLANT
CLOTH BEACON ORANGE TIMEOUT ST (SAFETY) ×2 IMPLANT
CORDS BIPOLAR (ELECTRODE) ×2 IMPLANT
COVER MAYO STAND STRL (DRAPES) ×2 IMPLANT
COVER TABLE BACK 60X90 (DRAPES) ×2 IMPLANT
CUFF TOURNIQUET SINGLE 18IN (TOURNIQUET CUFF) ×2 IMPLANT
DECANTER SPIKE VIAL GLASS SM (MISCELLANEOUS) IMPLANT
DRAPE EXTREMITY T 121X128X90 (DRAPE) ×2 IMPLANT
DRAPE OEC MINIVIEW 54X84 (DRAPES) ×2 IMPLANT
DRAPE SURG 17X23 STRL (DRAPES) ×2 IMPLANT
GAUZE XEROFORM 1X8 LF (GAUZE/BANDAGES/DRESSINGS) ×2 IMPLANT
GLOVE BIO SURGEON STRL SZ 6.5 (GLOVE) ×4 IMPLANT
GLOVE BIO SURGEON STRL SZ7.5 (GLOVE) ×4 IMPLANT
GLOVE BIOGEL PI IND STRL 7.0 (GLOVE) ×1 IMPLANT
GLOVE BIOGEL PI IND STRL 8.5 (GLOVE) ×1 IMPLANT
GLOVE BIOGEL PI INDICATOR 7.0 (GLOVE) ×1
GLOVE BIOGEL PI INDICATOR 8.5 (GLOVE) ×1
GLOVE INDICATOR 8.0 STRL GRN (GLOVE) IMPLANT
GLOVE SURG ORTHO 8.0 STRL STRW (GLOVE) ×4 IMPLANT
GLOVE SURG SS PI 8.5 STRL IVOR (GLOVE) ×1
GLOVE SURG SS PI 8.5 STRL STRW (GLOVE) ×1 IMPLANT
GOWN BRE IMP PREV XXLGXLNG (GOWN DISPOSABLE) ×4 IMPLANT
GOWN PREVENTION PLUS XLARGE (GOWN DISPOSABLE) ×2 IMPLANT
KWIRE 4.0 X .035IN (WIRE) ×2 IMPLANT
NS IRRIG 1000ML POUR BTL (IV SOLUTION) ×2 IMPLANT
PACK BASIN DAY SURGERY FS (CUSTOM PROCEDURE TRAY) ×2 IMPLANT
PAD CAST 4YDX4 CTTN HI CHSV (CAST SUPPLIES) ×1 IMPLANT
PADDING CAST ABS 3INX4YD NS (CAST SUPPLIES)
PADDING CAST ABS 4INX4YD NS (CAST SUPPLIES)
PADDING CAST ABS COTTON 3X4 (CAST SUPPLIES) IMPLANT
PADDING CAST ABS COTTON 4X4 ST (CAST SUPPLIES) IMPLANT
PADDING CAST COTTON 4X4 STRL (CAST SUPPLIES) ×1
SLEEVE SCD COMPRESS KNEE MED (MISCELLANEOUS) ×2 IMPLANT
SPLINT PLASTER CAST XFAST 3X15 (CAST SUPPLIES) ×37 IMPLANT
SPLINT PLASTER XTRA FASTSET 3X (CAST SUPPLIES) ×37
SPONGE GAUZE 4X4 12PLY (GAUZE/BANDAGES/DRESSINGS) ×2 IMPLANT
STOCKINETTE 4X48 STRL (DRAPES) ×2 IMPLANT
SUT ETHILON 3 0 PS 1 (SUTURE) ×2 IMPLANT
SUT ETHILON 4 0 PS 2 18 (SUTURE) ×2 IMPLANT
SUT MERSILENE 3 0 FS 1 (SUTURE) ×2 IMPLANT
SUT VIC AB 0 CT1 27 (SUTURE)
SUT VIC AB 0 CT1 27XBRD ANBCTR (SUTURE) IMPLANT
SUT VIC AB 2-0 SH 27 (SUTURE)
SUT VIC AB 2-0 SH 27XBRD (SUTURE) IMPLANT
SUT VICRYL 4-0 PS2 18IN ABS (SUTURE) ×2 IMPLANT
SUT VICRYL RAPIDE 4/0 PS 2 (SUTURE) IMPLANT
SYR BULB 3OZ (MISCELLANEOUS) ×2 IMPLANT
SYR CONTROL 10ML LL (SYRINGE) IMPLANT
TOWEL OR 17X24 6PK STRL BLUE (TOWEL DISPOSABLE) ×2 IMPLANT
UNDERPAD 30X30 INCONTINENT (UNDERPADS AND DIAPERS) ×2 IMPLANT
WATER STERILE IRR 1000ML POUR (IV SOLUTION) IMPLANT

## 2011-10-05 NOTE — Transfer of Care (Signed)
Immediate Anesthesia Transfer of Care Note  Patient: Michelle [name]  Procedure(s) Performed: Procedure(s) (LRB) with comments: CARPECTOMY (Left) - left proximal row carpectomy bone grafting capitate and radial styloidectomy  Patient Location: PACU  Anesthesia Type: GA combined with regional for post-op pain  Level of Consciousness: sedated  Airway & Oxygen Therapy: Patient Spontanous Breathing and Patient connected to face mask oxygen  Post-op Assessment: Report given to PACU RN and Post -op Vital signs reviewed and stable  Post vital signs: Reviewed and stable  Complications: No apparent anesthesia complications

## 2011-10-05 NOTE — Anesthesia Procedure Notes (Addendum)
Anesthesia Regional Block:  Supraclavicular block  Pre-Anesthetic Checklist: ,, timeout performed, Correct Patient, Correct Site, Correct Laterality, Correct Procedure,, site marked, risks and benefits discussed, Surgical consent,  Pre-op evaluation,  At surgeon's request and post-op pain management  Laterality: Right  Prep: chloraprep       Needles:  Injection technique: Single-shot  Needle Type: Echogenic Stimulator Needle     Needle Length: 5cm 5 cm Needle Gauge: 22 and 22 G    Additional Needles:  Procedures: ultrasound guided and nerve stimulator Supraclavicular block  Nerve Stimulator or Paresthesia:  Response: bicep contraction, 0.45 mA,   Additional Responses:   Narrative:  Injection made incrementally with aspirations every 5 mL.  Performed by: Personally  Anesthesiologist: J. Adonis Huguenin, MD  Additional Notes: Functioning IV was confirmed and monitors applied.  A 50mm 22ga echogenic arrow stimulator was used. Sterile prep and drape,hand hygiene and sterile gloves were used.Ultrasound guidance: relevant anatomy identified, needle position confirmed, local anesthetic spread visualized around nerve(s)., vascular puncture avoided.  Image printed for medical record.  Negative aspiration and negative test dose prior to incremental administration of local anesthetic. The patient tolerated the procedure well.  Interscalene brachial plexus block Procedure Name: LMA Insertion Date/Time: 10/05/2011 1:50 PM Performed by: Karryn Kosinski D Pre-anesthesia Checklist: Patient identified, Emergency Drugs available, Suction available and Patient being monitored Patient Re-evaluated:Patient Re-evaluated prior to inductionOxygen Delivery Method: Circle System Utilized Preoxygenation: Pre-oxygenation with 100% oxygen Intubation Type: IV induction Ventilation: Mask ventilation without difficulty LMA: LMA inserted LMA Size: 4.0 Number of attempts: 1 Airway Equipment and Method:  bite block Placement Confirmation: positive ETCO2 Tube secured with: Tape Dental Injury: Teeth and Oropharynx as per pre-operative assessment

## 2011-10-05 NOTE — H&P (Signed)
  Michelle [name] is an 46 y.o. female.   Chief Complaint: left wrist carpal avn HPI: 46 yo rhd female with left wrist pain after bumping into daughter.  Had some pain prior to this incident.  Seen in office.  CT and MRI reveal avn of lunate, scaphoid and cystic change of capitate with collapse of lunate.  Past Medical History  Diagnosis Date  . Asthma   . GERD (gastroesophageal reflux disease)   . Peptic ulcer disease 1984  . Episodic tension type headache   . Arthritis     hips, knees and spine  . Anxiety   . Depression   . Carpal tunnel syndrome of left wrist   . Barrett's esophagus   . Gastroparesis   . DM (diabetes mellitus)   . IBS (irritable bowel syndrome)   . Chronic kidney disease     Past Surgical History  Procedure Date  . Cervical fusion 2000    x2  . Joint replacement 2009    left knee x3  . Back surgery 2008    lumbar  fusion  . Cholecystectomy 1991  . Carpal tunnel release 2011    right hand  . Carpal tunnel release 11/23/2010    Procedure: CARPAL TUNNEL RELEASE;  Surgeon: Cammy Copa;  Location: [facility] OR;  Service: Orthopedics;  Laterality: Left;  revision left carpal tunnel release neurolysis of median nerve  . Esophagogastroduodenoscopy 03/20/2011    Procedure: ESOPHAGOGASTRODUODENOSCOPY (EGD);  Surgeon: Hilarie Fredrickson, MD;  Location: [facility] ENDOSCOPY;  Service: Endoscopy;  Laterality: N/A;  [place] Dec /ja  . Knee arthroscopy     right    Family History  Problem Relation Age of Onset  . Esophageal cancer Father   . Clotting disorder Daughter    Social History:  reports that she quit smoking about 5 months ago. Her smoking use included Cigarettes. She has a 15 pack-year smoking history. She has never used smokeless tobacco. She reports that she does not drink alcohol or use illicit drugs.  Allergies:  Allergies  Allergen Reactions  . Codeine Itching  . Penicillins Itching and Nausea And Vomiting    REACTION: Rash/Itchy    No prescriptions prior to  admission    No results found for this or any previous visit (from the past 48 hour(s)).  No results found.   A comprehensive review of systems was negative except for: Eyes: positive for contacts/glasses Behavioral/Psych: positive for anxiety and depression  There were no vitals taken for this visit.  General appearance: alert, cooperative and appears stated age Head: Normocephalic, without obvious abnormality, atraumatic Neck: supple, symmetrical, trachea midline Resp: clear to auscultation bilaterally Cardio: regular rate and rhythm GI: non tender Extremities: light touch sensation and capillary refill intact all digits.  +epl/fpl/io.  ttp globally at left wrist.  skin intact.  mild swelling of wrist. Pulses: 2+ and symmetric Skin: Skin color, texture, turgor normal. 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  Discussed at length with patient the nature of the condition and non operative and operative treatment options.  She wishes to proceed with operative treatment.  Risks, benefits, and alternatives of surgery were discussed and the patient agrees with the plan of care.   Zaccheaus Storlie R 10/05/2011, 9:30 AM

## 2011-10-05 NOTE — Op Note (Signed)
Dictation [phone]

## 2011-10-05 NOTE — Anesthesia Preprocedure Evaluation (Addendum)
Anesthesia Evaluation  Patient identified by MRN, date of birth, ID band Patient awake    Reviewed: H&P , NPO status , Patient's Chart, lab work & pertinent test results  History of Anesthesia Complications Negative for: history of anesthetic complications  Airway Mallampati: I TM Distance: >3 FB Neck ROM: Full    Dental  (+) Edentulous Lower, Poor Dentition and Dental Advisory Given   Pulmonary asthma , Current Smoker,          Cardiovascular negative cardio ROS      Neuro/Psych  Headaches, PSYCHIATRIC DISORDERS Anxiety Depression Bipolar Disorder    GI/Hepatic Neg liver ROS, PUD, GERD-  Medicated,  Endo/Other  diabetesDiet controlled  Renal/GU resolved     Musculoskeletal   Abdominal   Peds  Hematology   Anesthesia Other Findings   Reproductive/Obstetrics                          Anesthesia Physical Anesthesia Plan  ASA: III  Anesthesia Plan: General   Post-op Pain Management:    Induction: Intravenous  Airway Management Planned: LMA  Additional Equipment:   Intra-op Plan:   Post-operative Plan:   Informed Consent: I have reviewed the patients History and Physical, chart, labs and discussed the procedure including the risks, benefits and alternatives for the proposed anesthesia with the patient or authorized representative who has indicated his/her understanding and acceptance.   Dental advisory given  Plan Discussed with: CRNA, Anesthesiologist and Surgeon  Anesthesia Plan Comments:         Anesthesia Quick Evaluation

## 2011-10-05 NOTE — Anesthesia Postprocedure Evaluation (Signed)
Anesthesia Post Note  Patient: Michelle [name]  Procedure(s) Performed: Procedure(s) (LRB): CARPECTOMY (Left)  Anesthesia type: general  Patient location: PACU  Post pain: Pain level controlled  Post assessment: Patient's Cardiovascular Status Stable  Last Vitals:  Filed Vitals:   10/05/11 1703  BP:   Pulse: 75  Temp:   Resp: 13    Post vital signs: Reviewed and stable  Level of consciousness: sedated  Complications: No apparent anesthesia complications

## 2011-10-05 NOTE — Progress Notes (Signed)
Assisted Dr. Singer with left, ultrasound guided, supraclavicular block. Side rails up, monitors on throughout procedure. See vital signs in flow sheet. Tolerated Procedure well. 

## 2011-10-06 ENCOUNTER — Encounter (HOSPITAL_BASED_OUTPATIENT_CLINIC_OR_DEPARTMENT_OTHER): Payer: Self-pay | Admitting: Orthopedic Surgery

## 2011-10-06 NOTE — Op Note (Signed)
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  PROCEDURE:  Proximal row carpectomy, radial styloidectomy, posterior interosseous nerve neurectomy, bone grafting capitate cysts.  SURGEON:  Betha Loa, MD  ASSISTANT:  Cindee Salt, MD  ANESTHESIA:  General with regional.  IV FLUIDS:  Per anesthesia flow sheet.  ESTIMATED BLOOD LOSS:  Minimal.  COMPLICATIONS:  None.  SPECIMENS:  Lunate and capitate cyst to Pathology.  TOURNIQUET TIME:  103 minutes.  DISPOSITION:  Stable to PACU.  INDICATIONS:  Ms. Stathis is a 46 year old female, who has had pain in her left wrist.  She had been having pain for a couple of months when she bumped into her daughter and had increase in her pain.  She was seen at the emergency department where radiographs were taken revealing lunate fragmentation.  She followed in the office with me after being referred by Dr. Izora Ribas.  CT and MRI confirmed collapse of the lunate as well as avascular changes to the lunate, proximal pole of the scaphoid, and cystic changes to the capitate.  I discussed at length with Ms. Lindwall, the nature of her condition.  We discussed nonoperative and operative treatment options including proximal row carpectomy with bone grafting of the capitate versus total wrist fusion.  She elected to proceed with proximal row carpectomy and grafting of the capitate. Risks, benefits, and alternatives of surgery were discussed including risk of  blood loss, infection, damage to nerves, vessels, tendons, ligaments, bone; failure of surgery; need for additional surgery, complications with wound healing; continued pain, instability, and stiffness.  She voiced understanding of these risks and elected to proceed.  OPERATIVE COURSE:  After being identified preoperatively by myself, the patient and I agreed upon the procedure and site procedure.  The surgical site was marked.  The risks, benefits, and alternatives of surgery were reviewed and she wished to proceed.  Surgical consent had been signed.  She was given a gram of IV vancomycin as preoperative antibiotic prophylaxis due to penicillin allergy.  A regional block was performed by Anesthesia in preoperative holding.  She was transported to the operating room, placed on the operating table in supine position with left upper extremity on arm board.  General anesthesia was induced by anesthesiologist.  The left upper extremity was prepped and draped in normal sterile orthopedic fashion.  A surgical pause was performed between surgeons, anesthesia, and operating staff and all were in agreement as to the patient, procedure, and site of procedure. Tourniquet at the proximal aspect of the extremities were inflated to 250 mmHg after exsanguination of the limb with an Esmarch bandage.  A longitudinal incision was made, centered over the wrist on  the dorsum of the hand.  This was carried into subcutaneous tissues by spreading technique.  Bipolar electrocautery was used to obtain hemostasis.  The dorsal compartment was incised.  The extensor pollicis longus tendon was then released from the third compartment and transposed.  The posterior interosseous nerve was identified and 2 cm was excised using the bipolar.  The capsule of the wrist was incised longitudinally and then T'd at its base.  There was synovitis surrounding tendons.  There was synovitis within the joint.  The lunate was  identified and had fragmented and  collapsed.  The scaphoid was then removed using combination of the rongeurs and knife blade to free up the ligaments.  Care was taken to protect the radioscaphocapitate ligament.  It was intact.  The triquetrum was then removed in the same fashion.  This was saved for later bone grafting for the capitate.  The entirety of the triquetrum was removed.  The lunate was then removed.  The bone of the lunate was very yellow and collapsed.  The lunate was sent to Pathology for examination.  The joint was examined.  The radioscaphocapitate and volar wrist ligaments had been preserved.  All bony remainders had been removed.  The capitate reduced into the lunate facet well.  The radial styloid would bump into the trapezoid.  It was felt that the radial styloidectomy was appropriate.  A 4 mm of the distal portion of the radial styloid were then removed using the rongeur.  Care was again taken to preserve the radioscaphocapitate ligament.  Once the radial styloidectomy had been performed, there was no longer any impingement. The capitate cysts were entered from the midportion of the capitate on the radial side.  This was at approximately the level of the trapezoid. The C-arm was used to confirm if it had entered the cyst which was the case.  Curettes were then used to free up all tissue within the cyst. It was very fluidy.  There was some tissue.  This was removed and sent to Pathology for examination.  A blunt-tipped needle was used to flush out the cyst.  The bone from the triquetrum was then used to bone graft the capitate cyst.  The articular cartilage had been removed.  C-arm was used in AP and lateral projections to ensure appropriate graft in the cysts which was the case.  There was much better density of the capitate.  The wound was then copiously irrigated with sterile saline. The capsular rent was then repaired with 3-0 Vicryl suture in a figure- of-eight  and running fashion.  The extensor retinaculum was then again repaired with 3-0 Vicryl suture.  The EPL tendon was left transposed. The skin was closed with 4-0 nylon in horizontal mattress fashion.  The C-arm was used in AP and lateral projections to ensure appropriate position of the capitate in the lunate facet.  It did ride slightly radially.  On the lateral view, it was within the facet.  The wound was dressed with sterile Xeroform, 4x4s, and wrapped with a Kerlix bandage. A volar and dorsal slab splint and a radial sided splint were then required to immobilize the wrist.  The lunate wanted to ride up on the ridge between the lunate and scaphoid facets.  On the lateral view, it was within the facet.  Some slight compression on the wrist would reduce the capitate into the lunate facet.  It was felt that once her muscle tone returned, this would reduce the capitate into the lunate  facet well.  The splint was wrapped with Kerlix and Ace bandage.  Tourniquet was deflated at 103 minutes.  The fingertips were pink with brisk capillary refill after deflation of the tourniquet.  Operative drapes were broken down and the patient was awoken from anesthesia safely.  She was transferred back to stretcher and taken to PACU in stable condition.  I will give her Percocet 10/325, 1-2 p.o. q.6 hours p.r.n. pain, dispensed #40 and dilaudid 2 mg po q6 hours prn breakthrough pain dispense #10.  I will see her back in the office in 1 week for postoperative followup.     Betha Loa, MD     KK/MEDQ  D:  10/05/2011  T:  10/06/2011  Job:  [id number]

## 2011-10-18 ENCOUNTER — Emergency Department (HOSPITAL_COMMUNITY)
Admission: EM | Admit: 2011-10-18 | Discharge: 2011-10-18 | Disposition: A | Discharge status: Home / Self Care | Payer: Medicaid Other | Attending: Emergency Medicine | Admitting: Emergency Medicine

## 2011-10-18 ENCOUNTER — Encounter (HOSPITAL_COMMUNITY): Payer: Self-pay | Admitting: Emergency Medicine

## 2011-10-18 DIAGNOSIS — E119 Type 2 diabetes mellitus without complications: Secondary | ICD-10-CM | POA: Insufficient documentation

## 2011-10-18 DIAGNOSIS — F3289 Other specified depressive episodes: Secondary | ICD-10-CM | POA: Insufficient documentation

## 2011-10-18 DIAGNOSIS — G44219 Episodic tension-type headache, not intractable: Secondary | ICD-10-CM | POA: Insufficient documentation

## 2011-10-18 DIAGNOSIS — J45909 Unspecified asthma, uncomplicated: Secondary | ICD-10-CM | POA: Insufficient documentation

## 2011-10-18 DIAGNOSIS — N390 Urinary tract infection, site not specified: Secondary | ICD-10-CM

## 2011-10-18 DIAGNOSIS — K219 Gastro-esophageal reflux disease without esophagitis: Secondary | ICD-10-CM | POA: Insufficient documentation

## 2011-10-18 DIAGNOSIS — N189 Chronic kidney disease, unspecified: Secondary | ICD-10-CM | POA: Insufficient documentation

## 2011-10-18 DIAGNOSIS — Z87891 Personal history of nicotine dependence: Secondary | ICD-10-CM | POA: Insufficient documentation

## 2011-10-18 DIAGNOSIS — M479 Spondylosis, unspecified: Secondary | ICD-10-CM | POA: Insufficient documentation

## 2011-10-18 DIAGNOSIS — K3184 Gastroparesis: Secondary | ICD-10-CM | POA: Insufficient documentation

## 2011-10-18 DIAGNOSIS — K589 Irritable bowel syndrome without diarrhea: Secondary | ICD-10-CM | POA: Insufficient documentation

## 2011-10-18 DIAGNOSIS — K2270 Barrett's esophagus without dysplasia: Secondary | ICD-10-CM | POA: Insufficient documentation

## 2011-10-18 DIAGNOSIS — F329 Major depressive disorder, single episode, unspecified: Secondary | ICD-10-CM | POA: Insufficient documentation

## 2011-10-18 DIAGNOSIS — K279 Peptic ulcer, site unspecified, unspecified as acute or chronic, without hemorrhage or perforation: Secondary | ICD-10-CM | POA: Insufficient documentation

## 2011-10-18 DIAGNOSIS — M129 Arthropathy, unspecified: Secondary | ICD-10-CM | POA: Insufficient documentation

## 2011-10-18 DIAGNOSIS — F411 Generalized anxiety disorder: Secondary | ICD-10-CM | POA: Insufficient documentation

## 2011-10-18 LAB — URINALYSIS, ROUTINE W REFLEX MICROSCOPIC
Bilirubin Urine: NEGATIVE
Glucose, UA: NEGATIVE mg/dL
Ketones, ur: NEGATIVE mg/dL
Nitrite: NEGATIVE
Protein, ur: NEGATIVE mg/dL
Specific Gravity, Urine: 1.009 (ref 1.005–1.030)
Urobilinogen, UA: 0.2 mg/dL (ref 0.0–1.0)
pH: 7.5 (ref 5.0–8.0)

## 2011-10-18 LAB — URINE MICROSCOPIC-ADD ON

## 2011-10-18 MED ORDER — OXYCODONE-ACETAMINOPHEN 5-325 MG PO TABS: ORAL_TABLET | ORAL | Status: DC

## 2011-10-18 MED ORDER — CIPROFLOXACIN HCL 500 MG PO TABS
500.0000 mg | ORAL_TABLET | Freq: Once | ORAL | Status: AC
  Administered 2011-10-18: 500 mg via ORAL
  Filled 2011-10-18: qty 1

## 2011-10-18 MED ORDER — OXYCODONE-ACETAMINOPHEN 5-325 MG PO TABS
2.0000 | ORAL_TABLET | Freq: Once | ORAL | Status: AC
  Administered 2011-10-18: 2 via ORAL
  Filled 2011-10-18: qty 2

## 2011-10-18 MED ORDER — CIPROFLOXACIN HCL 500 MG PO TABS: 500.0000 mg | ORAL_TABLET | Freq: Two times a day (BID) | ORAL | Status: DC

## 2011-10-18 MED ORDER — PHENAZOPYRIDINE HCL 200 MG PO TABS: 200.0000 mg | ORAL_TABLET | Freq: Three times a day (TID) | ORAL | Status: DC

## 2011-10-18 NOTE — ED Notes (Signed)
Pt c/o urinary symptoms, pain with urination and dark urine x's 1 week.

## 2011-10-18 NOTE — Discharge Instructions (Signed)
 Urinary Tract Infection Urinary tract infections (UTIs) can develop anywhere along your urinary tract. Your urinary tract is your body's drainage system for removing wastes and extra water . Your urinary tract includes two kidneys, two ureters, a bladder, and a urethra. Your kidneys are a pair of bean-shaped organs. Each kidney is about the size of your fist. They are located below your ribs, one on each side of your spine. CAUSES Infections are caused by microbes, which are microscopic organisms, including fungi, viruses, and bacteria. These organisms are so small that they can only be seen through a microscope. Bacteria are the microbes that most commonly cause UTIs. SYMPTOMS  Symptoms of UTIs may vary by age and gender of the patient and by the location of the infection. Symptoms in young women typically include a frequent and intense urge to urinate and a painful, burning feeling in the bladder or urethra during urination. Older women and men are more likely to be tired, shaky, and weak and have muscle aches and abdominal pain. A fever may mean the infection is in your kidneys. Other symptoms of a kidney infection include pain in your back or sides below the ribs, nausea, and vomiting. DIAGNOSIS To diagnose a UTI, your caregiver will ask you about your symptoms. Your caregiver also will ask to provide a urine sample. The urine sample will be tested for bacteria and white blood cells. White blood cells are made by your body to help fight infection. TREATMENT  Typically, UTIs can be treated with medication. Because most UTIs are caused by a bacterial infection, they usually can be treated with the use of antibiotics. The choice of antibiotic and length of treatment depend on your symptoms and the type of bacteria causing your infection. HOME CARE INSTRUCTIONS  If you were prescribed antibiotics, take them exactly as your caregiver instructs you. Finish the medication even if you feel better after you  have only taken some of the medication.  Drink enough water  and fluids to keep your urine clear or pale yellow.  Avoid caffeine, tea, and carbonated beverages. They tend to irritate your bladder.  Empty your bladder often. Avoid holding urine for long periods of time.  Empty your bladder before and after sexual intercourse.  After a bowel movement, women should cleanse from front to back. Use each tissue only once. SEEK MEDICAL CARE IF:   You have back pain.  You develop a fever.  Your symptoms do not begin to resolve within 3 days. SEEK IMMEDIATE MEDICAL CARE IF:   You have severe back pain or lower abdominal pain.  You develop chills.  You have nausea or vomiting.  You have continued burning or discomfort with urination. MAKE SURE YOU:   Understand these instructions.  Will watch your condition.  Will get help right away if you are not doing well or get worse. Document Released: 10/07/2004 Document Revised: 06/29/2011 Document Reviewed: 02/05/2011 [facility] Patient Information 2013 [place], [place].    Narcotic and benzodiazepine use may cause drowsiness, slowed breathing or dependence.  Please use with caution and do not drive, operate machinery or watch young children alone while taking them.  Taking combinations of these medications or drinking alcohol  will potentiate these effects.

## 2011-10-18 NOTE — ED Provider Notes (Signed)
History  This chart was scribed for Michelle [name]. Oletta Lamas, MD by Lynelle Smoke. The patient was seen in room TR06C/TR06C. Patient's care was started at 1520.   CSN: [id number]  Arrival date & time 10/18/11  1520   First MD Initiated Contact with Patient 10/18/11 1604      Chief Complaint  Patient presents with  . Urinary Tract Infection     The history is provided by the patient. No language interpreter was used.   Michelle [name] is a 45 y.o. female who presents to the Emergency Department complaining of one week of intermittent, persistent, suprapubic abdominal pain radiating to her lower back with associated dysuria and frequency. She states that she consumed more water than normal to try to treat the symptoms with no improvement. She reports having a prior history of kidney infections and UTIs that are similar to current symptoms. She also c/o chronic knee and back pain but denies vomiting, chills, or fever. She states that she was taking percocet for a surgery on her left arm done by Dr. Merlyn Lot 2 weeks ago but ran out recently. She reports that she has been following up with Dr. Merlyn Lot and states that her arm is improving. She has a h/o asthma, GERD, and DM. She is a former smoker but denies alcohol use.  PCP is Dr. Mayford Knife.  Past Medical History  Diagnosis Date  . Asthma   . GERD (gastroesophageal reflux disease)   . Peptic ulcer disease 1984  . Episodic tension type headache   . Arthritis     hips, knees and spine  . Anxiety   . Depression   . Carpal tunnel syndrome of left wrist   . Barrett's esophagus   . Gastroparesis   . DM (diabetes mellitus)   . IBS (irritable bowel syndrome)   . Chronic kidney disease     Past Surgical History  Procedure Date  . Cervical fusion 2000    x2  . Joint replacement 2009    left knee x3  . Back surgery 2008    lumbar  fusion  . Cholecystectomy 1991  . Carpal tunnel release 2011    right hand  . Carpal tunnel release 11/23/2010   Procedure: CARPAL TUNNEL RELEASE;  Surgeon: Cammy Copa;  Location: [facility] OR;  Service: Orthopedics;  Laterality: Left;  revision left carpal tunnel release neurolysis of median nerve  . Esophagogastroduodenoscopy 03/20/2011    Procedure: ESOPHAGOGASTRODUODENOSCOPY (EGD);  Surgeon: Hilarie Fredrickson, MD;  Location[facility] ENDOSCOPY;  Service: Endoscopy;  Laterality: N/A;  [place] Dec /ja  . Knee arthroscopy     right  . Carpectomy 10/05/2011    Procedure: CARPECTOMY;  Surgeon: Tami Ribas, MD;  Location: [facility];  Service: Orthopedics;  Laterality: Left;  left proximal row carpectomy bone grafting capitate and radial styloidectomy    Family History  Problem Relation Age of Onset  . Esophageal cancer Father   . Clotting disorder Daughter     History  Substance Use Topics  . Smoking status: Former Smoker -- 0.5 packs/day for 30 years    Types: Cigarettes    Quit date: 04/28/2011  . Smokeless tobacco: Never Used  . Alcohol Use: No    No OB history provided.  Review of Systems  Constitutional: Negative for fever and chills.  Gastrointestinal: Positive for abdominal pain. Negative for nausea and vomiting.  Genitourinary: Positive for dysuria and frequency. Negative for hematuria.  Musculoskeletal: Positive for back pain (chronic) and arthralgias.  Positive for chronic knee pain  Skin: Negative for rash.    Allergies  Codeine and Penicillins  Home Medications   Current Outpatient Rx  Name Route Sig Dispense Refill  . ALBUTEROL SULFATE HFA 108 (90 BASE) MCG/ACT IN AERS Inhalation Inhale 2 puffs into the lungs 2 (two) times daily as needed. FOR WHEEZING      . BC HEADACHE POWDER PO Oral Take 1 packet by mouth daily as needed. For pain/headache    . CLONAZEPAM 1 MG PO TABS Oral Take 1 mg by mouth 2 (two) times daily.    Marland Kitchen DOXEPIN HCL 50 MG PO CAPS Oral Take 50 mg by mouth at bedtime.    . DULOXETINE HCL 30 MG PO CPEP Oral Take 30-60 mg by mouth 2 (two) times daily.  Take 2 capsules by mouth in the morning and 1 capsule by mouth at night    . GABAPENTIN 300 MG PO CAPS Oral Take 600 mg by mouth 2 (two) times daily.     Marland Kitchen HYDROXYZINE HCL 25 MG PO TABS Oral Take 25 mg by mouth every 6 (six) hours as needed. For itching    . OMEPRAZOLE 40 MG PO CPDR Oral Take 40 mg by mouth 2 (two) times daily.    . OXYCODONE-ACETAMINOPHEN 10-325 MG PO TABS Oral Take 1 tablet by mouth every 6 (six) hours as needed. For pain.    Marland Kitchen PROMETHAZINE HCL 12.5 MG PO TABS Oral Take 12.5 mg by mouth every 6 (six) hours as needed. For nausea/vomiting    . TRAMADOL HCL 50 MG PO TABS Oral Take 50 mg by mouth every 6 (six) hours as needed. For pain.    . TRAZODONE HCL 50 MG PO TABS Oral Take 75 mg by mouth at bedtime.     . TRIAMCINOLONE ACETONIDE 0.1 % EX CREA Topical Apply 1 application topically 3 (three) times daily as needed. For rash    . CIPROFLOXACIN HCL 500 MG PO TABS Oral Take 1 tablet (500 mg total) by mouth 2 (two) times daily. 20 tablet 0  . OXYCODONE-ACETAMINOPHEN 5-325 MG PO TABS  1-2 tablets po q 6 hours prn moderate to severe pain 20 tablet 0  . PHENAZOPYRIDINE HCL 200 MG PO TABS Oral Take 1 tablet (200 mg total) by mouth 3 (three) times daily. 6 tablet 0    Triage Vitals: BP 112/76  Pulse 62  Temp 98.3 F (36.8 C) (Oral)  Resp 20  SpO2 100%  Physical Exam  Nursing note and vitals reviewed. Constitutional: She is oriented to person, place, and time. She appears well-developed and well-nourished. No distress.  HENT:  Head: Normocephalic and atraumatic.  Eyes: EOM are normal.       No nystagmus   Neck: Neck supple. No tracheal deviation present.  Cardiovascular: Normal rate.   Pulmonary/Chest: Effort normal. No respiratory distress.  Abdominal: Soft. She exhibits no distension. There is tenderness.       Mild right suprapubic and CVA tenderness  Musculoskeletal: Normal range of motion. She exhibits no edema.       No back pain or spasms, good peripheral pulses    Neurological: She is alert and oriented to person, place, and time.  Skin: Skin is warm and dry.  Psychiatric: She has a normal mood and affect. Her behavior is normal.    ED Course  Procedures (including critical care time)  DIAGNOSTIC STUDIES: Oxygen Saturation is 100% on room air normal by my interpretation.    COORDINATION OF CARE:  4:20PM- Patient informed of clinical course, understand medical decision-making process, and agrees with plan.   Labs Reviewed  URINALYSIS, ROUTINE W REFLEX MICROSCOPIC - Abnormal; Notable for the following:    APPearance HAZY (*)     Hgb urine dipstick SMALL (*)     Leukocytes, UA MODERATE (*)     All other components within normal limits  URINE MICROSCOPIC-ADD ON - Abnormal; Notable for the following:    Squamous Epithelial / LPF FEW (*)     All other components within normal limits  URINE CULTURE  LAB REPORT - SCANNED   No results found.   1. Urinary tract infection       MDM  I personally performed the services described in this documentation, which was scribed in my presence. The recorded information has been reviewed and considered.    PT with mild tenderness, no guard or rebound on exam.  Pt's UA strongly suggestive of UTI.  Pt improved after PO pain meds, will d/c with analgesics and abx.  Pt also ran out of pain meds for her recent upper extremity surgery.      Michelle [name]. Zabrina Brotherton, MD 10/24/11 1654

## 2011-10-20 LAB — URINE CULTURE: Colony Count: 45000

## 2011-10-24 ENCOUNTER — Encounter (HOSPITAL_COMMUNITY): Payer: Self-pay | Admitting: Emergency Medicine

## 2011-11-16 ENCOUNTER — Ambulatory Visit: Payer: Medicaid Other | Admitting: Occupational Therapy

## 2011-11-23 ENCOUNTER — Ambulatory Visit: Payer: Medicaid Other | Attending: Orthopedic Surgery | Admitting: *Deleted

## 2011-11-23 DIAGNOSIS — M6281 Muscle weakness (generalized): Secondary | ICD-10-CM | POA: Insufficient documentation

## 2011-11-23 DIAGNOSIS — M25549 Pain in joints of unspecified hand: Secondary | ICD-10-CM | POA: Insufficient documentation

## 2011-11-23 DIAGNOSIS — R279 Unspecified lack of coordination: Secondary | ICD-10-CM | POA: Insufficient documentation

## 2011-11-23 DIAGNOSIS — IMO0001 Reserved for inherently not codable concepts without codable children: Secondary | ICD-10-CM | POA: Insufficient documentation

## 2011-11-23 DIAGNOSIS — R5381 Other malaise: Secondary | ICD-10-CM | POA: Insufficient documentation

## 2011-11-23 DIAGNOSIS — M25649 Stiffness of unspecified hand, not elsewhere classified: Secondary | ICD-10-CM | POA: Insufficient documentation

## 2011-12-01 ENCOUNTER — Ambulatory Visit: Payer: Medicaid Other | Admitting: Occupational Therapy

## 2011-12-21 ENCOUNTER — Encounter: Payer: Medicaid Other | Admitting: Occupational Therapy

## 2012-01-12 DIAGNOSIS — Z8614 Personal history of Methicillin resistant Staphylococcus aureus infection: Secondary | ICD-10-CM

## 2012-01-12 HISTORY — DX: Personal history of Methicillin resistant Staphylococcus aureus infection: Z86.14

## 2012-01-31 ENCOUNTER — Emergency Department (HOSPITAL_COMMUNITY)
Admission: EM | Admit: 2012-01-31 | Discharge: 2012-01-31 | Disposition: A | Discharge status: Home / Self Care | Payer: Medicaid Other | Attending: Emergency Medicine | Admitting: Emergency Medicine

## 2012-01-31 ENCOUNTER — Encounter (HOSPITAL_COMMUNITY): Payer: Self-pay | Admitting: Family Medicine

## 2012-01-31 DIAGNOSIS — Z8711 Personal history of peptic ulcer disease: Secondary | ICD-10-CM | POA: Insufficient documentation

## 2012-01-31 DIAGNOSIS — M1289 Other specific arthropathies, not elsewhere classified, multiple sites: Secondary | ICD-10-CM | POA: Insufficient documentation

## 2012-01-31 DIAGNOSIS — Z8719 Personal history of other diseases of the digestive system: Secondary | ICD-10-CM | POA: Insufficient documentation

## 2012-01-31 DIAGNOSIS — F3289 Other specified depressive episodes: Secondary | ICD-10-CM | POA: Insufficient documentation

## 2012-01-31 DIAGNOSIS — G43909 Migraine, unspecified, not intractable, without status migrainosus: Secondary | ICD-10-CM | POA: Insufficient documentation

## 2012-01-31 DIAGNOSIS — Z87891 Personal history of nicotine dependence: Secondary | ICD-10-CM | POA: Insufficient documentation

## 2012-01-31 DIAGNOSIS — H00019 Hordeolum externum unspecified eye, unspecified eyelid: Secondary | ICD-10-CM | POA: Insufficient documentation

## 2012-01-31 DIAGNOSIS — N189 Chronic kidney disease, unspecified: Secondary | ICD-10-CM | POA: Insufficient documentation

## 2012-01-31 DIAGNOSIS — E119 Type 2 diabetes mellitus without complications: Secondary | ICD-10-CM | POA: Insufficient documentation

## 2012-01-31 DIAGNOSIS — Z79899 Other long term (current) drug therapy: Secondary | ICD-10-CM | POA: Insufficient documentation

## 2012-01-31 DIAGNOSIS — K219 Gastro-esophageal reflux disease without esophagitis: Secondary | ICD-10-CM | POA: Insufficient documentation

## 2012-01-31 DIAGNOSIS — F411 Generalized anxiety disorder: Secondary | ICD-10-CM | POA: Insufficient documentation

## 2012-01-31 DIAGNOSIS — R110 Nausea: Secondary | ICD-10-CM | POA: Insufficient documentation

## 2012-01-31 DIAGNOSIS — F329 Major depressive disorder, single episode, unspecified: Secondary | ICD-10-CM | POA: Insufficient documentation

## 2012-01-31 DIAGNOSIS — J45909 Unspecified asthma, uncomplicated: Secondary | ICD-10-CM | POA: Insufficient documentation

## 2012-01-31 DIAGNOSIS — Z8669 Personal history of other diseases of the nervous system and sense organs: Secondary | ICD-10-CM | POA: Insufficient documentation

## 2012-01-31 MED ORDER — METOCLOPRAMIDE HCL 5 MG/ML IJ SOLN
10.0000 mg | Freq: Once | INTRAMUSCULAR | Status: AC
  Administered 2012-01-31: 10 mg via INTRAMUSCULAR
  Filled 2012-01-31: qty 2

## 2012-01-31 MED ORDER — OXYCODONE-ACETAMINOPHEN 5-325 MG PO TABS: 2.0000 | ORAL_TABLET | ORAL | Status: DC | PRN

## 2012-01-31 MED ORDER — ONDANSETRON 8 MG PO TBDP: ORAL_TABLET | ORAL | Status: DC

## 2012-01-31 MED ORDER — METOCLOPRAMIDE HCL 10 MG PO TABS: 10.0000 mg | ORAL_TABLET | Freq: Four times a day (QID) | ORAL | Status: DC | PRN

## 2012-01-31 MED ORDER — DIPHENHYDRAMINE HCL 25 MG PO CAPS
50.0000 mg | ORAL_CAPSULE | Freq: Once | ORAL | Status: AC
  Administered 2012-01-31: 50 mg via ORAL
  Filled 2012-01-31: qty 2

## 2012-01-31 MED ORDER — FENTANYL CITRATE 0.05 MG/ML IJ SOLN
100.0000 ug | Freq: Once | INTRAMUSCULAR | Status: AC
  Administered 2012-01-31: 100 ug via NASAL
  Filled 2012-01-31: qty 2

## 2012-01-31 MED ORDER — PREDNISONE 20 MG PO TABS
60.0000 mg | ORAL_TABLET | Freq: Once | ORAL | Status: AC
  Administered 2012-01-31: 60 mg via ORAL
  Filled 2012-01-31: qty 3

## 2012-01-31 NOTE — ED Notes (Signed)
Per pt sts left eye swelling over the past few day. sts the swelling is causing head pain. Pt has small bump to left eye. sts tried home remedies but not working.

## 2012-01-31 NOTE — ED Provider Notes (Signed)
History     CSN: [id number]  Arrival date & time 01/31/12  1534   First MD Initiated Contact with Patient 01/31/12 1801      Chief Complaint  Patient presents with  . Eye Problem    (Consider location/radiation/quality/duration/timing/severity/associated sxs/prior treatment) Patient is a 47 y.o. female presenting with eye problem.  Eye Problem    this 47 year old complains of a two-day history gradual onset typical migraine headache for her, usually she takes over-the-counter Excedrin when she has a headache like this, she did not taking Excedrin this time, she is gradual onset headache without sudden onset headache, she is no trauma or fever, she is no change in speech vision swallowing or understanding, she is no focal weakness numbness or incoordination, she coincidentally does have a sty in her left upper eyelid the last couple of days as well with no trauma or foreign body sensation discharge or severe pain to her eyelid, she has no eye globe pain, she has no chest pain shortness breath abdominal pain or vomiting, she does have some nausea. She does have typical photophobia from her headache. There is no treatment prior to arrival. She is no discharge from her eyes.  Past Medical History  Diagnosis Date  . Asthma   . GERD (gastroesophageal reflux disease)   . Peptic ulcer disease 1984  . Episodic tension type headache   . Arthritis     hips, knees and spine  . Anxiety   . Depression   . Carpal tunnel syndrome of left wrist   . Barrett's esophagus   . Gastroparesis   . DM (diabetes mellitus)   . IBS (irritable bowel syndrome)   . Chronic kidney disease     Past Surgical History  Procedure Date  . Cervical fusion 2000    x2  . Joint replacement 2009    left knee x3  . Back surgery 2008    lumbar  fusion  . Cholecystectomy 1991  . Carpal tunnel release 2011    right hand  . Carpal tunnel release 11/23/2010    Procedure: CARPAL TUNNEL RELEASE;  Surgeon: Cammy Copa;  Location: [facility] OR;  Service: Orthopedics;  Laterality: Left;  revision left carpal tunnel release neurolysis of median nerve  . Esophagogastroduodenoscopy 03/20/2011    Procedure: ESOPHAGOGASTRODUODENOSCOPY (EGD);  Surgeon: Hilarie Fredrickson, MD;  Location: [facility] ENDOSCOPY;  Service: Endoscopy;  Laterality: N/A;  [place] Dec /ja  . Knee arthroscopy     right  . Carpectomy 10/05/2011    Procedure: CARPECTOMY;  Surgeon: Tami Ribas, MD;  Location: [facility];  Service: Orthopedics;  Laterality: Left;  left proximal row carpectomy bone grafting capitate and radial styloidectomy    Family History  Problem Relation Age of Onset  . Esophageal cancer Father   . Clotting disorder Daughter     History  Substance Use Topics  . Smoking status: Former Smoker -- 0.5 packs/day for 30 years    Types: Cigarettes    Quit date: 04/28/2011  . Smokeless tobacco: Never Used  . Alcohol Use: No    OB History    Grav Para Term Preterm Abortions TAB SAB Ect Mult Living                  Review of Systems  10 Systems reviewed and are negative for acute change except as noted in the HPI.   Allergies  Codeine and Penicillins  Home Medications   Current Outpatient Rx  Name  Route  Sig  Dispense  Refill  . DULOXETINE HCL 30 MG PO CPEP   Oral   Take 30-60 mg by mouth 2 (two) times daily. Take 2 capsules by mouth in the morning and 1 capsule by mouth at night         . GABAPENTIN 300 MG PO CAPS   Oral   Take 600 mg by mouth 2 (two) times daily.          Marland Kitchen HYDROXYZINE HCL 25 MG PO TABS   Oral   Take 25 mg by mouth every 6 (six) hours as needed. For itching         . OMEPRAZOLE 40 MG PO CPDR   Oral   Take 40 mg by mouth 2 (two) times daily.         . TRAZODONE HCL 50 MG PO TABS   Oral   Take 75 mg by mouth at bedtime.          Marland Kitchen METOCLOPRAMIDE HCL 10 MG PO TABS   Oral   Take 1 tablet (10 mg total) by mouth every 6 (six) hours as needed (nausea/headache).   6 tablet    0   . ONDANSETRON 8 MG PO TBDP      8mg  ODT q4 hours prn nausea   4 tablet   0   . OXYCODONE-ACETAMINOPHEN 5-325 MG PO TABS   Oral   Take 2 tablets by mouth every 4 (four) hours as needed for pain.   6 tablet   0     BP 119/84  Pulse 74  Temp 98 F (36.7 C)  Resp 18  SpO2 100%  Physical Exam  Nursing note and vitals reviewed. Constitutional: She is oriented to person, place, and time.       Awake, alert, nontoxic appearance with baseline speech for patient.  HENT:  Head: Normocephalic and atraumatic.  Right Ear: External ear normal.  Left Ear: External ear normal.  Mouth/Throat: No oropharyngeal exudate.  Eyes: Conjunctivae normal and EOM are normal. Pupils are equal, round, and reactive to light. Right eye exhibits no discharge. Left eye exhibits no discharge. No scleral icterus.       Small Hordeolum noted on upper eyelid of left eye without cellulitis or current drainage  Neck: Normal range of motion. Neck supple.  Cardiovascular: Normal rate, regular rhythm and normal heart sounds.   No murmur heard. Pulmonary/Chest: Effort normal and breath sounds normal. No stridor. No respiratory distress. She has no wheezes. She has no rales. She exhibits no tenderness.  Abdominal: Soft. Bowel sounds are normal. She exhibits no mass. There is no tenderness. There is no rebound.  Musculoskeletal: Normal range of motion. She exhibits no tenderness.       Baseline ROM, moves extremities with no obvious new focal weakness.  Lymphadenopathy:    She has no cervical adenopathy.  Neurological: She is alert and oriented to person, place, and time. No cranial nerve deficit or sensory deficit. She displays no seizure activity. Coordination and gait normal.       Mental status appears normal, major cranial nerves appeared intact, pupils are equal and reactive, extraocular movements intact, peripheral visual fields full to confrontation, no facial asymmetry, motor strength is 5 out of 5  bilaterally with no pronator drift, she has normal light touch bilaterally, she has normal bilateral finger to nose testing, she has normal gait.  Skin: Skin is warm and dry. No rash noted.  Psychiatric: She has a normal mood and  affect. Her behavior is normal.    ED Course  Procedures (including critical care time)  Labs Reviewed - No data to display No results found.   1. Migraine headache   2. Hordeolum       MDM  Pt stable in ED with no significant deterioration in condition. Patient / Family / Caregiver informed of clinical course, understand medical decision-making process, and agree with plan.  I doubt any other EMC precluding discharge at this time including, but not necessarily limited to the following: Subarachnoid hemorrhage, stroke, serious bacterial illness.          Hurman Horn, MD 02/07/12 [phone]

## 2012-01-31 NOTE — ED Notes (Signed)
Pt c/o migraine headache onset last pm, nausea without vomiting also c/o bump on left upper eyelid.

## 2012-02-11 ENCOUNTER — Emergency Department (HOSPITAL_COMMUNITY)
Admission: EM | Admit: 2012-02-11 | Discharge: 2012-02-12 | Disposition: A | Discharge status: Home / Self Care | Payer: Medicaid Other | Attending: Emergency Medicine | Admitting: Emergency Medicine

## 2012-02-11 ENCOUNTER — Encounter (HOSPITAL_COMMUNITY): Payer: Self-pay | Admitting: *Deleted

## 2012-02-11 DIAGNOSIS — Z8739 Personal history of other diseases of the musculoskeletal system and connective tissue: Secondary | ICD-10-CM | POA: Insufficient documentation

## 2012-02-11 DIAGNOSIS — F329 Major depressive disorder, single episode, unspecified: Secondary | ICD-10-CM | POA: Insufficient documentation

## 2012-02-11 DIAGNOSIS — E119 Type 2 diabetes mellitus without complications: Secondary | ICD-10-CM | POA: Insufficient documentation

## 2012-02-11 DIAGNOSIS — Z87891 Personal history of nicotine dependence: Secondary | ICD-10-CM | POA: Insufficient documentation

## 2012-02-11 DIAGNOSIS — N189 Chronic kidney disease, unspecified: Secondary | ICD-10-CM | POA: Insufficient documentation

## 2012-02-11 DIAGNOSIS — Z8719 Personal history of other diseases of the digestive system: Secondary | ICD-10-CM | POA: Insufficient documentation

## 2012-02-11 DIAGNOSIS — R109 Unspecified abdominal pain: Secondary | ICD-10-CM

## 2012-02-11 DIAGNOSIS — F411 Generalized anxiety disorder: Secondary | ICD-10-CM | POA: Insufficient documentation

## 2012-02-11 DIAGNOSIS — Z8711 Personal history of peptic ulcer disease: Secondary | ICD-10-CM | POA: Insufficient documentation

## 2012-02-11 DIAGNOSIS — K219 Gastro-esophageal reflux disease without esophagitis: Secondary | ICD-10-CM | POA: Insufficient documentation

## 2012-02-11 DIAGNOSIS — Z79899 Other long term (current) drug therapy: Secondary | ICD-10-CM | POA: Insufficient documentation

## 2012-02-11 DIAGNOSIS — F3289 Other specified depressive episodes: Secondary | ICD-10-CM | POA: Insufficient documentation

## 2012-02-11 DIAGNOSIS — Z9089 Acquired absence of other organs: Secondary | ICD-10-CM | POA: Insufficient documentation

## 2012-02-11 DIAGNOSIS — J45909 Unspecified asthma, uncomplicated: Secondary | ICD-10-CM | POA: Insufficient documentation

## 2012-02-11 NOTE — ED Notes (Signed)
Pt c/o R flank pain radiating to R abdomen

## 2012-02-12 ENCOUNTER — Emergency Department (HOSPITAL_COMMUNITY): Payer: Medicaid Other

## 2012-02-12 LAB — CBC WITH DIFFERENTIAL/PLATELET
Basophils Absolute: 0 K/uL (ref 0.0–0.1)
Basophils Relative: 0 % (ref 0–1)
Eosinophils Absolute: 0.2 K/uL (ref 0.0–0.7)
Eosinophils Relative: 2 % (ref 0–5)
HCT: 41.2 % (ref 36.0–46.0)
Hemoglobin: 14 g/dL (ref 12.0–15.0)
Lymphocytes Relative: 22 % (ref 12–46)
Lymphs Abs: 1.6 K/uL (ref 0.7–4.0)
MCH: 29.2 pg (ref 26.0–34.0)
MCHC: 34 g/dL (ref 30.0–36.0)
MCV: 85.8 fL (ref 78.0–100.0)
Monocytes Absolute: 0.5 K/uL (ref 0.1–1.0)
Monocytes Relative: 6 % (ref 3–12)
Neutro Abs: 5.4 K/uL (ref 1.7–7.7)
Neutrophils Relative %: 70 % (ref 43–77)
Platelets: 131 K/uL — ABNORMAL LOW (ref 150–400)
RBC: 4.8 MIL/uL (ref 3.87–5.11)
RDW: 14.4 % (ref 11.5–15.5)
WBC: 7.6 K/uL (ref 4.0–10.5)

## 2012-02-12 LAB — COMPREHENSIVE METABOLIC PANEL WITH GFR
ALT: 10 U/L (ref 0–35)
AST: 14 U/L (ref 0–37)
Albumin: 3.2 g/dL — ABNORMAL LOW (ref 3.5–5.2)
Alkaline Phosphatase: 117 U/L (ref 39–117)
BUN: 10 mg/dL (ref 6–23)
CO2: 28 meq/L (ref 19–32)
Calcium: 8.7 mg/dL (ref 8.4–10.5)
Chloride: 99 meq/L (ref 96–112)
Creatinine, Ser: 1.21 mg/dL — ABNORMAL HIGH (ref 0.50–1.10)
GFR calc Af Amer: 61 mL/min — ABNORMAL LOW
GFR calc non Af Amer: 52 mL/min — ABNORMAL LOW
Glucose, Bld: 131 mg/dL — ABNORMAL HIGH (ref 70–99)
Potassium: 3.4 meq/L — ABNORMAL LOW (ref 3.5–5.1)
Sodium: 137 meq/L (ref 135–145)
Total Bilirubin: 0.1 mg/dL — ABNORMAL LOW (ref 0.3–1.2)
Total Protein: 7.1 g/dL (ref 6.0–8.3)

## 2012-02-12 LAB — URINALYSIS, MICROSCOPIC ONLY
Bilirubin Urine: NEGATIVE
Glucose, UA: NEGATIVE mg/dL
Hgb urine dipstick: NEGATIVE
Ketones, ur: NEGATIVE mg/dL
Leukocytes, UA: NEGATIVE
Nitrite: NEGATIVE
Protein, ur: NEGATIVE mg/dL
Specific Gravity, Urine: 1.02 (ref 1.005–1.030)
Urine-Other: NONE SEEN
Urobilinogen, UA: 1 mg/dL (ref 0.0–1.0)
pH: 5.5 (ref 5.0–8.0)

## 2012-02-12 MED ORDER — HYDROMORPHONE HCL PF 1 MG/ML IJ SOLN
1.0000 mg | Freq: Once | INTRAMUSCULAR | Status: AC
  Administered 2012-02-12: 1 mg via INTRAVENOUS
  Filled 2012-02-12: qty 1

## 2012-02-12 MED ORDER — ONDANSETRON HCL 4 MG/2ML IJ SOLN
4.0000 mg | Freq: Once | INTRAMUSCULAR | Status: AC
  Administered 2012-02-12: 4 mg via INTRAVENOUS
  Filled 2012-02-12: qty 2

## 2012-02-12 MED ORDER — OXYCODONE-ACETAMINOPHEN 5-325 MG PO TABS: 2.0000 | ORAL_TABLET | Freq: Four times a day (QID) | ORAL | Status: DC | PRN

## 2012-02-12 MED ORDER — METOCLOPRAMIDE HCL 10 MG PO TABS: 10.0000 mg | ORAL_TABLET | Freq: Four times a day (QID) | ORAL | Status: DC | PRN

## 2012-02-12 MED ORDER — SODIUM CHLORIDE 0.9 % IV SOLN
Freq: Once | INTRAVENOUS | Status: AC
  Administered 2012-02-12: 01:00:00 via INTRAVENOUS

## 2012-02-12 NOTE — ED Provider Notes (Signed)
Medical screening examination/treatment/procedure(s) were performed by non-physician practitioner and as supervising physician I was immediately available for consultation/collaboration.  Latrisa Hellums R. Worthington Cruzan, MD 02/12/12 1459 

## 2012-02-12 NOTE — ED Provider Notes (Addendum)
History     CSN: [id number]  Arrival date & time 02/11/12  2258   First MD Initiated Contact with Patient 02/11/12 2357      Chief Complaint  Patient presents with  . Flank Pain    (Consider location/radiation/quality/duration/timing/severity/associated sxs/prior treatment) HPI Comments: Flank pain rad to abdomen nausea   The history is provided by the patient.    Past Medical History  Diagnosis Date  . Asthma   . GERD (gastroesophageal reflux disease)   . Peptic ulcer disease 1984  . Episodic tension type headache   . Arthritis     hips, knees and spine  . Anxiety   . Depression   . Carpal tunnel syndrome of left wrist   . Barrett's esophagus   . Gastroparesis   . DM (diabetes mellitus)   . IBS (irritable bowel syndrome)   . Chronic kidney disease     Past Surgical History  Procedure Date  . Cervical fusion 2000    x2  . Joint replacement 2009    left knee x3  . Back surgery 2008    lumbar  fusion  . Cholecystectomy 1991  . Carpal tunnel release 2011    right hand  . Carpal tunnel release 11/23/2010    Procedure: CARPAL TUNNEL RELEASE;  Surgeon: Cammy Copa;  Location: [facility] OR;  Service: Orthopedics;  Laterality: Left;  revision left carpal tunnel release neurolysis of median nerve  . Esophagogastroduodenoscopy 03/20/2011    Procedure: ESOPHAGOGASTRODUODENOSCOPY (EGD);  Surgeon: Hilarie Fredrickson, MD;  Location: [facility]COPY;  Service: Endoscopy;  Laterality: N/A;  [place] Dec /ja  . Knee arthroscopy     right  . Carpectomy 10/05/2011    Procedure: CARPECTOMY;  Surgeon: Tami Ribas, MD;  Location: [facility];  Service: Orthopedics;  Laterality: Left;  left proximal row carpectomy bone grafting capitate and radial styloidectomy    Family History  Problem Relation Age of Onset  . Esophageal cancer Father   . Clotting disorder Daughter     History  Substance Use Topics  . Smoking status: Former Smoker -- 0.5 packs/day for 30 years    Types:  Cigarettes    Quit date: 04/28/2011  . Smokeless tobacco: Never Used  . Alcohol Use: No    OB History    Grav Para Term Preterm Abortions TAB SAB Ect Mult Living                  Review of Systems  Constitutional: Negative for fever.  Gastrointestinal: Positive for abdominal pain.  Genitourinary: Positive for flank pain. Negative for dysuria and hematuria.  Skin: Positive for pallor.    Allergies  Codeine and Penicillins  Home Medications   Current Outpatient Rx  Name  Route  Sig  Dispense  Refill  . DULOXETINE HCL 30 MG PO CPEP   Oral   Take 30 mg by mouth 2 (two) times daily.         Marland Kitchen GABAPENTIN 300 MG PO CAPS   Oral   Take 300 mg by mouth 2 (two) times daily.          Marland Kitchen HYDROXYZINE HCL 25 MG PO TABS   Oral   Take 25 mg by mouth every 6 (six) hours as needed. For itching         . OMEPRAZOLE 40 MG PO CPDR   Oral   Take 40 mg by mouth 2 (two) times daily.         Marland Kitchen  TRAMADOL HCL 50 MG PO TABS   Oral   Take 50 mg by mouth every 6 (six) hours as needed. For pain         . METOCLOPRAMIDE HCL 10 MG PO TABS   Oral   Take 1 tablet (10 mg total) by mouth every 6 (six) hours as needed (nausea/headache).   6 tablet   0   . OXYCODONE-ACETAMINOPHEN 5-325 MG PO TABS   Oral   Take 2 tablets by mouth every 6 (six) hours as needed for pain.   10 tablet   0     BP 104/54  Pulse 80  Temp 98.5 F (36.9 C) (Oral)  Resp 18  SpO2 91%  Physical Exam  Constitutional: She is oriented to person, place, and time. She appears well-developed and well-nourished.  HENT:  Head: Normocephalic.  Eyes: Pupils are equal, round, and reactive to light.  Neck: Normal range of motion.  Cardiovascular: Normal rate.   Pulmonary/Chest: Effort normal.  Abdominal: Soft. Bowel sounds are normal. She exhibits no distension. There is no tenderness.  Musculoskeletal: Normal range of motion.  Neurological: She is alert and oriented to person, place, and time.  Skin: Skin is  warm. No pallor.    ED Course  Procedures (including critical care time)  Labs Reviewed  CBC WITH DIFFERENTIAL - Abnormal; Notable for the following:    Platelets 131 (*)     All other components within normal limits  COMPREHENSIVE METABOLIC PANEL - Abnormal; Notable for the following:    Potassium 3.4 (*)     Glucose, Bld 131 (*)     Creatinine, Ser 1.21 (*)     Albumin 3.2 (*)     Total Bilirubin 0.1 (*)     GFR calc non Af Amer 52 (*)     GFR calc Af Amer 61 (*)     All other components within normal limits  URINALYSIS, MICROSCOPIC ONLY   Ct Abdomen Pelvis Wo Contrast  02/12/2012  *RADIOLOGY REPORT*  Clinical Data: Right flank pain.  CT ABDOMEN AND PELVIS WITHOUT CONTRAST  Technique:  Multidetector CT imaging of the abdomen and pelvis was performed following the standard protocol without intravenous contrast.  Comparison: 04/27/2011  Findings: The lung bases are clear.  The kidneys appear symmetrical in size and shape.  Fetal lobulation bilaterally.  No pyelocaliectasis or ureterectasis.  No renal, ureteral, or bladder stones.  The bladder wall is not thickened.  There appears to be a vague infiltration in the peripancreatic fat which might represent early changes of pancreatitis.  Correlation with laboratory values is recommended.  Surgical absence of the gallbladder.  The unenhanced appearance of the liver, spleen, adrenal glands, abdominal aorta, and retroperitoneal lymph nodes is unremarkable. The stomach, small bowel, and colon are decompressed.  No free air or free fluid in the abdomen.  Pelvis:  The uterus and adnexal structures are not enlarged.  No free or loculated pelvic fluid collections.  No significant pelvic lymphadenopathy.  The appendix is normal.  No evidence of diverticulitis. Bilateral spondylolysis and postoperative changes and L5-S1. Normal alignment of the lumbar vertebrae.  IMPRESSION: No renal or ureteral stone or obstruction.  Suggestion of vague infiltration of the  peripancreatic fat could represent early changes of pancreatitis.   Original Report Authenticated By: Burman Nieves, M.D.      1. Abdominal pain       MDM   Labs renal function, CT Scan reveal no specific cause for abdominal discomfort will DC home  with pain control and have patient FU with PCP as needed       Arman Filter, NP 02/12/12 0505  Arman Filter, NP 02/22/12 2008

## 2012-02-12 NOTE — ED Notes (Signed)
Pt to CT at this time.

## 2012-02-23 NOTE — ED Provider Notes (Signed)
Medical screening examination/treatment/procedure(s) were performed by non-physician practitioner and as supervising physician I was immediately available for consultation/collaboration.   Laray Anger, DO 02/23/12 1557

## 2012-03-21 ENCOUNTER — Emergency Department (HOSPITAL_COMMUNITY)
Admission: EM | Admit: 2012-03-21 | Discharge: 2012-03-21 | Disposition: A | Discharge status: Home / Self Care | Payer: Medicaid Other | Attending: Emergency Medicine | Admitting: Emergency Medicine

## 2012-03-21 ENCOUNTER — Encounter (HOSPITAL_COMMUNITY): Payer: Self-pay | Admitting: Emergency Medicine

## 2012-03-21 DIAGNOSIS — M545 Low back pain, unspecified: Secondary | ICD-10-CM | POA: Insufficient documentation

## 2012-03-21 DIAGNOSIS — Z87891 Personal history of nicotine dependence: Secondary | ICD-10-CM | POA: Insufficient documentation

## 2012-03-21 DIAGNOSIS — Z8739 Personal history of other diseases of the musculoskeletal system and connective tissue: Secondary | ICD-10-CM | POA: Insufficient documentation

## 2012-03-21 DIAGNOSIS — F411 Generalized anxiety disorder: Secondary | ICD-10-CM | POA: Insufficient documentation

## 2012-03-21 DIAGNOSIS — Z79899 Other long term (current) drug therapy: Secondary | ICD-10-CM | POA: Insufficient documentation

## 2012-03-21 DIAGNOSIS — M79609 Pain in unspecified limb: Secondary | ICD-10-CM | POA: Insufficient documentation

## 2012-03-21 DIAGNOSIS — Z8719 Personal history of other diseases of the digestive system: Secondary | ICD-10-CM | POA: Insufficient documentation

## 2012-03-21 DIAGNOSIS — M549 Dorsalgia, unspecified: Secondary | ICD-10-CM

## 2012-03-21 DIAGNOSIS — Z8742 Personal history of other diseases of the female genital tract: Secondary | ICD-10-CM | POA: Insufficient documentation

## 2012-03-21 DIAGNOSIS — J45909 Unspecified asthma, uncomplicated: Secondary | ICD-10-CM | POA: Insufficient documentation

## 2012-03-21 DIAGNOSIS — F3289 Other specified depressive episodes: Secondary | ICD-10-CM | POA: Insufficient documentation

## 2012-03-21 DIAGNOSIS — N189 Chronic kidney disease, unspecified: Secondary | ICD-10-CM | POA: Insufficient documentation

## 2012-03-21 DIAGNOSIS — K219 Gastro-esophageal reflux disease without esophagitis: Secondary | ICD-10-CM | POA: Insufficient documentation

## 2012-03-21 DIAGNOSIS — K279 Peptic ulcer, site unspecified, unspecified as acute or chronic, without hemorrhage or perforation: Secondary | ICD-10-CM | POA: Insufficient documentation

## 2012-03-21 DIAGNOSIS — F329 Major depressive disorder, single episode, unspecified: Secondary | ICD-10-CM | POA: Insufficient documentation

## 2012-03-21 DIAGNOSIS — E119 Type 2 diabetes mellitus without complications: Secondary | ICD-10-CM | POA: Insufficient documentation

## 2012-03-21 MED ORDER — OXYCODONE-ACETAMINOPHEN 5-325 MG PO TABS
2.0000 | ORAL_TABLET | Freq: Once | ORAL | Status: AC
  Administered 2012-03-21: 2 via ORAL
  Filled 2012-03-21: qty 2

## 2012-03-21 MED ORDER — OXYCODONE-ACETAMINOPHEN 5-325 MG PO TABS: 2.0000 | ORAL_TABLET | ORAL | Status: DC | PRN

## 2012-03-21 MED ORDER — KETOROLAC TROMETHAMINE 60 MG/2ML IM SOLN
60.0000 mg | Freq: Once | INTRAMUSCULAR | Status: AC
  Administered 2012-03-21: 60 mg via INTRAMUSCULAR
  Filled 2012-03-21: qty 2

## 2012-03-21 NOTE — ED Provider Notes (Signed)
History     CSN: [id number]  Arrival date & time 03/21/12  1327   First MD Initiated Contact with Patient 03/21/12 1400      Chief Complaint  Patient presents with  . Back Pain  . Leg Pain    (Consider location/radiation/quality/duration/timing/severity/associated sxs/prior treatment) HPI Comments: Patient is a 47 year old female who presents with acute exacerbation of back pain 1 week ago. The pain is sharp and severe and radiates down bilateral legs. The pain is constant. Movement makes the pain worse. Nothing makes the pain better. Patient has tried ibuprofen, tylenol and goody powder without relief. No associated symptoms. No saddles paresthesias or bladder/bowel incontinence.      Past Medical History  Diagnosis Date  . Asthma   . GERD (gastroesophageal reflux disease)   . Peptic ulcer disease 1984  . Episodic tension type headache   . Arthritis     hips, knees and spine  . Anxiety   . Depression   . Carpal tunnel syndrome of left wrist   . Barrett's esophagus   . Gastroparesis   . DM (diabetes mellitus)   . IBS (irritable bowel syndrome)   . Chronic kidney disease     Past Surgical History  Procedure Laterality Date  . Cervical fusion  2000    x2  . Joint replacement  2009    left knee x3  . Back surgery  2008    lumbar  fusion  . Cholecystectomy  1991  . Carpal tunnel release  2011    right hand  . Carpal tunnel release  11/23/2010    Procedure: CARPAL TUNNEL RELEASE;  Surgeon: Cammy Copa;  Location: [facility] OR;  Service: Orthopedics;  Laterality: Left;  revision left carpal tunnel release neurolysis of median nerve  . Esophagogastroduodenoscopy  03/20/2011    Procedure: ESOPHAGOGASTRODUODENOSCOPY (EGD);  Surgeon: Hilarie Fredrickson, MD;  Location: [facility] ENDOSCOPY;  Service: Endoscopy;  Laterality: N/A;  [place] Dec /ja  . Knee arthroscopy      right  . Carpectomy  10/05/2011    Procedure: CARPECTOMY;  Surgeon: Tami Ribas, MD;  Location: [facility];   Service: Orthopedics;  Laterality: Left;  left proximal row carpectomy bone grafting capitate and radial styloidectomy    Family History  Problem Relation Age of Onset  . Esophageal cancer Father   . Clotting disorder Daughter     History  Substance Use Topics  . Smoking status: Former Smoker -- 0.50 packs/day for 30 years    Types: Cigarettes    Quit date: 04/28/2011  . Smokeless tobacco: Never Used  . Alcohol Use: No    OB History   Grav Para Term Preterm Abortions TAB SAB Ect Mult Living                  Review of Systems  Musculoskeletal: Positive for back pain.  All other systems reviewed and are negative.    Allergies  Codeine and Penicillins  Home Medications   Current Outpatient Rx  Name  Route  Sig  Dispense  Refill  . DULoxetine (CYMBALTA) 30 MG capsule   Oral   Take 30 mg by mouth 2 (two) times daily.         Marland Kitchen gabapentin (NEURONTIN) 300 MG capsule   Oral   Take 300 mg by mouth 2 (two) times daily.          . hydrOXYzine (ATARAX/VISTARIL) 25 MG tablet   Oral   Take 25 mg  by mouth every 6 (six) hours as needed. For itching         . lamoTRIgine (LAMICTAL) 25 MG tablet   Oral   Take 25 mg by mouth 2 (two) times daily.         Marland Kitchen oxyCODONE-acetaminophen (PERCOCET) 5-325 MG per tablet   Oral   Take 2 tablets by mouth every 6 (six) hours as needed for pain.   10 tablet   0   . traMADol (ULTRAM) 50 MG tablet   Oral   Take 50 mg by mouth every 6 (six) hours as needed. For pain         . EXPIRED: omeprazole (PRILOSEC) 40 MG capsule   Oral   Take 40 mg by mouth 2 (two) times daily.           BP 108/69  Pulse 82  Temp(Src) 97.9 F (36.6 C) (Oral)  Resp 17  SpO2 94%  Physical Exam  Nursing note and vitals reviewed. Constitutional: She is oriented to person, place, and time. She appears well-developed and well-nourished. No distress.  HENT:  Head: Normocephalic and atraumatic.  Eyes: Conjunctivae are normal.  Neck: Normal  range of motion. Neck supple.  Cardiovascular: Normal rate and regular rhythm.  Exam reveals no gallop and no friction rub.   No murmur heard. Pulmonary/Chest: Effort normal and breath sounds normal. She has no wheezes. She has no rales. She exhibits no tenderness.  Abdominal: Soft. There is no tenderness.  Musculoskeletal: Normal range of motion.  Generalized lumbosacral paraspinal tenderness to palpation. No midline spine tenderness to palpation.   Neurological: She is alert and oriented to person, place, and time.  Speech is goal-oriented. Moves limbs without ataxia.   Skin: Skin is warm and dry.  Psychiatric: She has a normal mood and affect. Her behavior is normal.    ED Course  Procedures (including critical care time)  Labs Reviewed - No data to display No results found.   1. Back pain       MDM  3:04 PM Patient given toradol and percocet for pain. Patient will be discharged with a short course of Percocet. No bladder/bowel incontinence or saddle paresthesias.         Emilia Beck, PA-C 03/21/12 1512

## 2012-03-21 NOTE — ED Notes (Signed)
States that she has reoccurring back pain due to sciatica. States that she has been having the pain for the past week.

## 2012-03-21 NOTE — Discharge Instructions (Signed)
Take Percocet as needed for pain. Refer to attached documents for more information regarding your diagnosis. Return to the ED with worsening or concerning symptoms.

## 2012-03-21 NOTE — Progress Notes (Signed)
Confirmed pcp as dwight willi[facility] EPIC updated

## 2012-03-22 NOTE — ED Provider Notes (Signed)
Medical screening examination/treatment/procedure(s) were performed by non-physician practitioner and as supervising physician I was immediately available for consultation/collaboration.  Christopher J. Pollina, MD 03/22/12 1611 

## 2012-03-23 ENCOUNTER — Encounter: Payer: Self-pay | Admitting: Internal Medicine

## 2012-04-01 ENCOUNTER — Emergency Department (HOSPITAL_COMMUNITY)
Admission: EM | Admit: 2012-04-01 | Discharge: 2012-04-01 | Disposition: A | Discharge status: Home / Self Care | Payer: Medicaid Other | Attending: Emergency Medicine | Admitting: Emergency Medicine

## 2012-04-01 ENCOUNTER — Encounter (HOSPITAL_COMMUNITY): Payer: Self-pay | Admitting: Emergency Medicine

## 2012-04-01 ENCOUNTER — Emergency Department (HOSPITAL_COMMUNITY): Payer: Medicaid Other

## 2012-04-01 DIAGNOSIS — W010XXA Fall on same level from slipping, tripping and stumbling without subsequent striking against object, initial encounter: Secondary | ICD-10-CM | POA: Insufficient documentation

## 2012-04-01 DIAGNOSIS — E119 Type 2 diabetes mellitus without complications: Secondary | ICD-10-CM | POA: Insufficient documentation

## 2012-04-01 DIAGNOSIS — Z8669 Personal history of other diseases of the nervous system and sense organs: Secondary | ICD-10-CM | POA: Insufficient documentation

## 2012-04-01 DIAGNOSIS — F411 Generalized anxiety disorder: Secondary | ICD-10-CM | POA: Insufficient documentation

## 2012-04-01 DIAGNOSIS — Y939 Activity, unspecified: Secondary | ICD-10-CM | POA: Insufficient documentation

## 2012-04-01 DIAGNOSIS — M545 Low back pain, unspecified: Secondary | ICD-10-CM

## 2012-04-01 DIAGNOSIS — Z8719 Personal history of other diseases of the digestive system: Secondary | ICD-10-CM | POA: Insufficient documentation

## 2012-04-01 DIAGNOSIS — F329 Major depressive disorder, single episode, unspecified: Secondary | ICD-10-CM | POA: Insufficient documentation

## 2012-04-01 DIAGNOSIS — Z8739 Personal history of other diseases of the musculoskeletal system and connective tissue: Secondary | ICD-10-CM | POA: Insufficient documentation

## 2012-04-01 DIAGNOSIS — Z79899 Other long term (current) drug therapy: Secondary | ICD-10-CM | POA: Insufficient documentation

## 2012-04-01 DIAGNOSIS — F3289 Other specified depressive episodes: Secondary | ICD-10-CM | POA: Insufficient documentation

## 2012-04-01 DIAGNOSIS — J45909 Unspecified asthma, uncomplicated: Secondary | ICD-10-CM | POA: Insufficient documentation

## 2012-04-01 DIAGNOSIS — Y92009 Unspecified place in unspecified non-institutional (private) residence as the place of occurrence of the external cause: Secondary | ICD-10-CM | POA: Insufficient documentation

## 2012-04-01 DIAGNOSIS — Z8711 Personal history of peptic ulcer disease: Secondary | ICD-10-CM | POA: Insufficient documentation

## 2012-04-01 DIAGNOSIS — Z87891 Personal history of nicotine dependence: Secondary | ICD-10-CM | POA: Insufficient documentation

## 2012-04-01 DIAGNOSIS — IMO0002 Reserved for concepts with insufficient information to code with codable children: Secondary | ICD-10-CM | POA: Insufficient documentation

## 2012-04-01 DIAGNOSIS — K219 Gastro-esophageal reflux disease without esophagitis: Secondary | ICD-10-CM | POA: Insufficient documentation

## 2012-04-01 DIAGNOSIS — K2270 Barrett's esophagus without dysplasia: Secondary | ICD-10-CM | POA: Insufficient documentation

## 2012-04-01 DIAGNOSIS — N189 Chronic kidney disease, unspecified: Secondary | ICD-10-CM | POA: Insufficient documentation

## 2012-04-01 MED ORDER — HYDROMORPHONE HCL PF 2 MG/ML IJ SOLN
2.0000 mg | Freq: Once | INTRAMUSCULAR | Status: AC
  Administered 2012-04-01: 2 mg via INTRAMUSCULAR
  Filled 2012-04-01: qty 1

## 2012-04-01 MED ORDER — OXYCODONE-ACETAMINOPHEN 5-325 MG PO TABS
1.0000 | ORAL_TABLET | Freq: Once | ORAL | Status: AC
  Administered 2012-04-01: 1 via ORAL
  Filled 2012-04-01: qty 1

## 2012-04-01 MED ORDER — OXYCODONE-ACETAMINOPHEN 5-325 MG PO TABS: 2.0000 | ORAL_TABLET | Freq: Four times a day (QID) | ORAL | Status: DC | PRN

## 2012-04-01 NOTE — ED Notes (Signed)
MD at bedside. 

## 2012-04-01 NOTE — ED Notes (Signed)
Pt transported to radiology.

## 2012-04-01 NOTE — ED Provider Notes (Signed)
History     CSN: [id number]  Arrival date & time 04/01/12  1552   First MD Initiated Contact with Patient 04/01/12 1726      Chief Complaint  Patient presents with  . Fall    (Consider location/radiation/quality/duration/timing/severity/associated sxs/prior treatment) Patient is a 47 y.o. female presenting with fall.  Fall   Complains of low back pain after she slipped and fell in her home approximately 2 hours prior to coming here. Patient states she landed on her buttocks from a standing position. Pain radiates to her right hip. She was ambulatory since the event. Pain is worse with movement improved with remaining still. No treatment prior to coming here. Pain is moderate to severe at present. No other associated symptoms Past Medical History  Diagnosis Date  . Asthma   . GERD (gastroesophageal reflux disease)   . Peptic ulcer disease 1984  . Episodic tension type headache   . Arthritis     hips, knees and spine  . Anxiety   . Depression   . Carpal tunnel syndrome of left wrist   . Barrett's esophagus   . Gastroparesis   . DM (diabetes mellitus)   . IBS (irritable bowel syndrome)   . Chronic kidney disease     Past Surgical History  Procedure Laterality Date  . Cervical fusion  2000    x2  . Joint replacement  2009    left knee x3  . Back surgery  2008    lumbar  fusion  . Cholecystectomy  1991  . Carpal tunnel release  2011    right hand  . Carpal tunnel release  11/23/2010    Procedure: CARPAL TUNNEL RELEASE;  Surgeon: Cammy Copa;  Location: [facility] OR;  Service: Orthopedics;  Laterality: Left;  revision left carpal tunnel release neurolysis of median nerve  . Esophagogastroduodenoscopy  03/20/2011    Procedure: ESOPHAGOGASTRODUODENOSCOPY (EGD);  Surgeon: Hilarie Fredrickson, MD;  Location: [facility] ENDOSCOPY;  Service: Endoscopy;  Laterality: N/A;  [place] Dec /ja  . Knee arthroscopy      right  . Carpectomy  10/05/2011    Procedure: CARPECTOMY;  Surgeon: Tami Ribas, MD;   Location: [facility];  Service: Orthopedics;  Laterality: Left;  left proximal row carpectomy bone grafting capitate and radial styloidectomy    Family History  Problem Relation Age of Onset  . Esophageal cancer Father   . Clotting disorder Daughter     History  Substance Use Topics  . Smoking status: Former Smoker -- 0.50 packs/day for 30 years    Types: Cigarettes    Quit date: 04/28/2011  . Smokeless tobacco: Never Used  . Alcohol Use: No   current smoker, no alcohol no illicit drugs  OB History   Grav Para Term Preterm Abortions TAB SAB Ect Mult Living                  Review of Systems  Constitutional: Negative.   Musculoskeletal: Positive for back pain and arthralgias.  Neurological: Negative.     Allergies  Codeine and Penicillins  Home Medications   Current Outpatient Rx  Name  Route  Sig  Dispense  Refill  . DULoxetine (CYMBALTA) 30 MG capsule   Oral   Take 30 mg by mouth 2 (two) times daily.         . furosemide (LASIX) 20 MG tablet   Oral   Take 20 mg by mouth daily.         Marland Kitchen  gabapentin (NEURONTIN) 300 MG capsule   Oral   Take 300 mg by mouth 2 (two) times daily.          . hydrOXYzine (ATARAX/VISTARIL) 25 MG tablet   Oral   Take 25 mg by mouth every 6 (six) hours as needed. For itching         . lamoTRIgine (LAMICTAL) 25 MG tablet   Oral   Take 25 mg by mouth 2 (two) times daily.         Marland Kitchen omeprazole (PRILOSEC) 40 MG capsule   Oral   Take 40 mg by mouth daily.         Marland Kitchen oxyCODONE-acetaminophen (PERCOCET) 5-325 MG per tablet   Oral   Take 2 tablets by mouth every 4 (four) hours as needed for pain.   6 tablet   0     BP 120/81  Pulse 63  Temp(Src) 97.7 F (36.5 C) (Oral)  SpO2 100%  Physical Exam  Nursing note and vitals reviewed. Constitutional: She appears well-developed and well-nourished.  HENT:  Head: Normocephalic and atraumatic.  Eyes: Conjunctivae are normal. Pupils are equal, round, and  reactive to light.  Neck: Neck supple. No tracheal deviation present. No thyromegaly present.  Cardiovascular: Normal rate and regular rhythm.   No murmur heard. Pulmonary/Chest: Effort normal and breath sounds normal.  Abdominal: Soft. Bowel sounds are normal. She exhibits no distension. There is no tenderness.  Morbidly obese  Musculoskeletal: Normal range of motion. She exhibits no edema and no tenderness.  Cervical spine nontender thoracic spine nontender, mild lumbar and paralumbar tenderness. Pain is exacerbated when she sits up from a supine position. Pelvis stable nontender all 4 extremities no contusion abrasion or tenderness neurovascularly intact. She has no pain on internal rotation of either thigh  Neurological: She is alert. Coordination normal.  Skin: Skin is warm and dry. No rash noted.  Psychiatric: She has a normal mood and affect.    ED Course  Procedures (including critical care time)  Labs Reviewed - No data to display No results found.  Results for orders placed during the hospital encounter of 02/11/12  CBC WITH DIFFERENTIAL      Result Value Range   WBC 7.6  4.0 - 10.5 K/uL   RBC 4.80  3.87 - 5.11 MIL/uL   Hemoglobin 14.0  12.0 - 15.0 g/dL   HCT 40.9  81.1 - 91.4 %   MCV 85.8  78.0 - 100.0 fL   MCH 29.2  26.0 - 34.0 pg   MCHC 34.0  30.0 - 36.0 g/dL   RDW 78.2  95.6 - 21.3 %   Platelets 131 (*) 150 - 400 K/uL   Neutrophils Relative 70  43 - 77 %   Neutro Abs 5.4  1.7 - 7.7 K/uL   Lymphocytes Relative 22  12 - 46 %   Lymphs Abs 1.6  0.7 - 4.0 K/uL   Monocytes Relative 6  3 - 12 %   Monocytes Absolute 0.5  0.1 - 1.0 K/uL   Eosinophils Relative 2  0 - 5 %   Eosinophils Absolute 0.2  0.0 - 0.7 K/uL   Basophils Relative 0  0 - 1 %   Basophils Absolute 0.0  0.0 - 0.1 K/uL  COMPREHENSIVE METABOLIC PANEL      Result Value Range   Sodium 137  135 - 145 mEq/L   Potassium 3.4 (*) 3.5 - 5.1 mEq/L   Chloride 99  96 - 112 mEq/L  CO2 28  19 - 32 mEq/L    Glucose, Bld 131 (*) 70 - 99 mg/dL   BUN 10  6 - 23 mg/dL   Creatinine, Ser 1.61 (*) 0.50 - 1.10 mg/dL   Calcium 8.7  8.4 - 09.6 mg/dL   Total Protein 7.1  6.0 - 8.3 g/dL   Albumin 3.2 (*) 3.5 - 5.2 g/dL   AST 14  0 - 37 U/L   ALT 10  0 - 35 U/L   Alkaline Phosphatase 117  39 - 117 U/L   Total Bilirubin 0.1 (*) 0.3 - 1.2 mg/dL   GFR calc non Af Amer 52 (*) >90 mL/min   GFR calc Af Amer 61 (*) >90 mL/min  URINALYSIS, MICROSCOPIC ONLY      Result Value Range   Color, Urine YELLOW  YELLOW   APPearance CLEAR  CLEAR   Specific Gravity, Urine 1.020  1.005 - 1.030   pH 5.5  5.0 - 8.0   Glucose, UA NEGATIVE  NEGATIVE mg/dL   Hgb urine dipstick NEGATIVE  NEGATIVE   Bilirubin Urine NEGATIVE  NEGATIVE   Ketones, ur NEGATIVE  NEGATIVE mg/dL   Protein, ur NEGATIVE  NEGATIVE mg/dL   Urobilinogen, UA 1.0  0.0 - 1.0 mg/dL   Nitrite NEGATIVE  NEGATIVE   Leukocytes, UA NEGATIVE  NEGATIVE   Urine-Other       Value: NO FORMED ELEMENTS SEEN ON URINE MICROSCOPIC EXAMINATION   Dg Lumbar Spine Complete  04/01/2012  *RADIOLOGY REPORT*  Clinical Data: Fall.  Back surgery 2008.  LUMBAR SPINE - COMPLETE 4+ VIEW  Comparison: 05/13/1928  Findings: Anterior approach interbody fusion L5-S1 with bone graft and screws which are unchanged in position.  Mild anterior slip L5 and S1 is unchanged.  Bilateral pars defects of L5.  The remaining lumbar alignment is normal.  No acute fracture.  The remaining disc spaces are intact.  Moderate degenerative change in the right hip joint.  IMPRESSION: L5-S1 fusion, unchanged from the  prior study.  Negative for acute fracture.   Original Report Authenticated By: Janeece Riggers, M.D.    Dg Pelvis 1-2 Views  04/01/2012  *RADIOLOGY REPORT*  Clinical Data: Fall with pelvic pain.  PELVIS - 1-2 VIEW  Comparison: 05/14/2011 and prior radiographs  Findings: There is no evidence of acute fracture, luxation or dislocation. Degenerative changes in both hips are again noted. No suspicious  focal bony lesions are present. Mild sclerosis within the right femoral head is unchanged and may represent AVN. Surgical hardware within the sacral region again identified.  IMPRESSION: No evidence of acute bony abnormality.  Bilateral hip degenerative changes and possible right femoral head AVN - no change.   Original Report Authenticated By: Harmon Pier, M.D.     No diagnosis found. X-rays reviewed by me Pain improved after treatment with IM Dilaudid. Requesting more pain medicine, Percocet ordered Patient is alert ambulatory without difficulty MDM   Patient suffers from chronic pain and has been 3 separate pain clinics in the past Plan prescription Percocet, 10 tablets Followup Dr. Mayford Knife, her PMD  Diagnosis #1 fall #2 low back pain       Doug Sou, MD 04/01/12 1859

## 2012-04-01 NOTE — ED Notes (Signed)
Per EMS pt came from home where she lost her balance and fell onto her lower back. No obvious deformity, no loss of consciousness. Pt now c/o lower back pain, abdominal pain and right leg.

## 2012-04-24 ENCOUNTER — Encounter (HOSPITAL_COMMUNITY): Payer: Self-pay | Admitting: Emergency Medicine

## 2012-04-24 ENCOUNTER — Emergency Department (HOSPITAL_COMMUNITY)
Admission: EM | Admit: 2012-04-24 | Discharge: 2012-04-24 | Disposition: A | Discharge status: Home / Self Care | Payer: Medicaid Other | Attending: Emergency Medicine | Admitting: Emergency Medicine

## 2012-04-24 ENCOUNTER — Emergency Department (HOSPITAL_COMMUNITY): Payer: Medicaid Other

## 2012-04-24 DIAGNOSIS — W010XXA Fall on same level from slipping, tripping and stumbling without subsequent striking against object, initial encounter: Secondary | ICD-10-CM | POA: Insufficient documentation

## 2012-04-24 DIAGNOSIS — IMO0002 Reserved for concepts with insufficient information to code with codable children: Secondary | ICD-10-CM | POA: Insufficient documentation

## 2012-04-24 DIAGNOSIS — Y9389 Activity, other specified: Secondary | ICD-10-CM | POA: Insufficient documentation

## 2012-04-24 DIAGNOSIS — F411 Generalized anxiety disorder: Secondary | ICD-10-CM | POA: Insufficient documentation

## 2012-04-24 DIAGNOSIS — Z87891 Personal history of nicotine dependence: Secondary | ICD-10-CM | POA: Insufficient documentation

## 2012-04-24 DIAGNOSIS — Z8669 Personal history of other diseases of the nervous system and sense organs: Secondary | ICD-10-CM | POA: Insufficient documentation

## 2012-04-24 DIAGNOSIS — K219 Gastro-esophageal reflux disease without esophagitis: Secondary | ICD-10-CM | POA: Insufficient documentation

## 2012-04-24 DIAGNOSIS — W1809XA Striking against other object with subsequent fall, initial encounter: Secondary | ICD-10-CM | POA: Insufficient documentation

## 2012-04-24 DIAGNOSIS — J45909 Unspecified asthma, uncomplicated: Secondary | ICD-10-CM | POA: Insufficient documentation

## 2012-04-24 DIAGNOSIS — Z8739 Personal history of other diseases of the musculoskeletal system and connective tissue: Secondary | ICD-10-CM | POA: Insufficient documentation

## 2012-04-24 DIAGNOSIS — Z8719 Personal history of other diseases of the digestive system: Secondary | ICD-10-CM | POA: Insufficient documentation

## 2012-04-24 DIAGNOSIS — F329 Major depressive disorder, single episode, unspecified: Secondary | ICD-10-CM | POA: Insufficient documentation

## 2012-04-24 DIAGNOSIS — Z79899 Other long term (current) drug therapy: Secondary | ICD-10-CM | POA: Insufficient documentation

## 2012-04-24 DIAGNOSIS — S20219A Contusion of unspecified front wall of thorax, initial encounter: Secondary | ICD-10-CM | POA: Insufficient documentation

## 2012-04-24 DIAGNOSIS — N189 Chronic kidney disease, unspecified: Secondary | ICD-10-CM | POA: Insufficient documentation

## 2012-04-24 DIAGNOSIS — S20211A Contusion of right front wall of thorax, initial encounter: Secondary | ICD-10-CM

## 2012-04-24 DIAGNOSIS — K2270 Barrett's esophagus without dysplasia: Secondary | ICD-10-CM | POA: Insufficient documentation

## 2012-04-24 DIAGNOSIS — F3289 Other specified depressive episodes: Secondary | ICD-10-CM | POA: Insufficient documentation

## 2012-04-24 DIAGNOSIS — Z8711 Personal history of peptic ulcer disease: Secondary | ICD-10-CM | POA: Insufficient documentation

## 2012-04-24 DIAGNOSIS — E119 Type 2 diabetes mellitus without complications: Secondary | ICD-10-CM | POA: Insufficient documentation

## 2012-04-24 DIAGNOSIS — Y92009 Unspecified place in unspecified non-institutional (private) residence as the place of occurrence of the external cause: Secondary | ICD-10-CM | POA: Insufficient documentation

## 2012-04-24 MED ORDER — HYDROCODONE-ACETAMINOPHEN 5-325 MG PO TABS: 1.0000 | ORAL_TABLET | ORAL | Status: DC | PRN

## 2012-04-24 MED ORDER — METHOCARBAMOL 500 MG PO TABS: 500.0000 mg | ORAL_TABLET | Freq: Two times a day (BID) | ORAL | Status: DC

## 2012-04-24 MED ORDER — KETOROLAC TROMETHAMINE 30 MG/ML IJ SOLN
30.0000 mg | Freq: Once | INTRAMUSCULAR | Status: AC
  Administered 2012-04-24: 30 mg via INTRAMUSCULAR
  Filled 2012-04-24: qty 1

## 2012-04-24 NOTE — ED Notes (Signed)
Call patient name twice no answer. 

## 2012-04-24 NOTE — ED Provider Notes (Signed)
History     CSN: [id number]  Arrival date & time 04/24/12  1108   First MD Initiated Contact with Patient 04/24/12 1318      Chief Complaint  Patient presents with  . Fall  . Flank Pain    (Consider location/radiation/quality/duration/timing/severity/associated sxs/prior treatment) HPI Comments: Pt presenting to the ED for right posterior rib pain after falling earlier today.  States she was walking through her living room carrying stuff in both hands when she tripped and fell.  Hit her back on a nearby stroller and playpen.  No head trauma or LOC.  Now reports intermittent, sharp, pain localized to right flank and lower ribs.  Pain exacerbated with twisting movements and deep breathing.  Hx of narcotic abuse and chronic back pain.  Denies any chest pain, SOB, or difficulty breathing.  The history is provided by the patient.    Past Medical History  Diagnosis Date  . Asthma   . GERD (gastroesophageal reflux disease)   . Peptic ulcer disease 1984  . Episodic tension type headache   . Arthritis     hips, knees and spine  . Anxiety   . Depression   . Carpal tunnel syndrome of left wrist   . Barrett's esophagus   . Gastroparesis   . DM (diabetes mellitus)   . IBS (irritable bowel syndrome)   . Chronic kidney disease     Past Surgical History  Procedure Laterality Date  . Cervical fusion  2000    x2  . Joint replacement  2009    left knee x3  . Back surgery  2008    lumbar  fusion  . Cholecystectomy  1991  . Carpal tunnel release  2011    right hand  . Carpal tunnel release  11/23/2010    Procedure: CARPAL TUNNEL RELEASE;  Surgeon: Cammy Copa;  Location: [facility] OR;  Service: Orthopedics;  Laterality: Left;  revision left carpal tunnel release neurolysis of median nerve  . Esophagogastroduodenoscopy  03/20/2011    Procedure: ESOPHAGOGASTRODUODENOSCOPY (EGD);  Surgeon: Hilarie Fredrickson, MD;  Location: [facility] ENDOSCOPY;  Service: Endoscopy;  Laterality: N/A;  Huntley Dec /ja  . Knee  arthroscopy      right  . Carpectomy  10/05/2011    Procedure: CARPECTOMY;  Surgeon: Tami Ribas, MD;  Location: [facility];  Service: Orthopedics;  Laterality: Left;  left proximal row carpectomy bone grafting capitate and radial styloidectomy    Family History  Problem Relation Age of Onset  . Esophageal cancer Father   . Clotting disorder Daughter     History  Substance Use Topics  . Smoking status: Former Smoker -- 0.50 packs/day for 30 years    Types: Cigarettes    Quit date: 04/28/2011  . Smokeless tobacco: Never Used  . Alcohol Use: No    OB History   Grav Para Term Preterm Abortions TAB SAB Ect Mult Living                  Review of Systems  Musculoskeletal: Positive for arthralgias.  All other systems reviewed and are negative.    Allergies  Codeine and Penicillins  Home Medications   Current Outpatient Rx  Name  Route  Sig  Dispense  Refill  . clonazePAM (KLONOPIN) 1 MG tablet   Oral   Take 1 mg by mouth 3 (three) times daily as needed for anxiety.         . DULoxetine (CYMBALTA) 30 MG capsule  Oral   Take 30 mg by mouth 2 (two) times daily.         . furosemide (LASIX) 20 MG tablet   Oral   Take 20 mg by mouth daily.         Marland Kitchen gabapentin (NEURONTIN) 300 MG capsule   Oral   Take 300 mg by mouth 2 (two) times daily.          Marland Kitchen lamoTRIgine (LAMICTAL) 25 MG tablet   Oral   Take 25 mg by mouth 2 (two) times daily.         Marland Kitchen omeprazole (PRILOSEC) 40 MG capsule   Oral   Take 40 mg by mouth daily.         . traZODone (DESYREL) 50 MG tablet   Oral   Take 50 mg by mouth at bedtime.           BP 135/92  Pulse 72  Temp(Src) 98 F (36.7 C) (Oral)  Resp 18  SpO2 94%  LMP 02/27/2011  Physical Exam  Nursing note and vitals reviewed. Constitutional: She is oriented to person, place, and time. She appears well-developed and well-nourished.  HENT:  Head: Normocephalic and atraumatic.  Mouth/Throat: Oropharynx is  clear and moist.  Eyes: Conjunctivae and EOM are normal. Pupils are equal, round, and reactive to light.  Neck: Normal range of motion. Neck supple.  Cardiovascular: Normal rate, regular rhythm and normal heart sounds.   Pulmonary/Chest: Effort normal and breath sounds normal. No respiratory distress. She has no decreased breath sounds. She has no wheezes.    TTP of right posterior ribs, no laceration, bruising, swelling or deformity; lungs CTAB  Abdominal: Soft. Bowel sounds are normal.  Musculoskeletal: Normal range of motion.  Neurological: She is alert and oriented to person, place, and time.  Skin: Skin is warm and dry.  Psychiatric: She has a normal mood and affect.    ED Course  Procedures (including critical care time)  Labs Reviewed - No data to display Dg Ribs Unilateral W/chest Right  04/24/2012  *RADIOLOGY REPORT*  Clinical Data: Larey Seat today.  Right-sided pain.  RIGHT RIBS AND CHEST - 3+ VIEW  Comparison: 08/09/2011  Findings: Heart size is normal.  Mediastinal shadows are normal. The lungs are clear.  No pneumothorax or hemothorax.  No visible rib fracture.  IMPRESSION: Negative radiographs   Original Report Authenticated By: Paulina Fusi, M.D.      1. Rib contusion, right, initial encounter       MDM   Pt presenting to the ED for R posterior rib pain after a fall at her home.  X-ray negative for acute fx.  toradol 60mg  given in the ED.  Rx vicodin and robaxin.  Incentive spirometry demonstrated in the ED and instructions given for home use.  FU with PCP if sx not improving.  Discussed plan with pt- she agreed.  Return precautions advised.       Garlon Hatchet, PA-C 04/24/12 1511

## 2012-04-24 NOTE — ED Notes (Signed)
Patient fell this morning while walking.  Patient injured her right flank when she tripped.  Denies LOC.  Patient has narcotic abuse history and chronic back pain.

## 2012-04-24 NOTE — ED Notes (Signed)
Pt states today she tripped over someone's feet falling on stroller and pack-n-play, complaining of R sided back pain, states hurts worse when pressing in then letting go. Pt in tears d/t pain.

## 2012-04-25 ENCOUNTER — Encounter (HOSPITAL_COMMUNITY): Payer: Self-pay | Admitting: Nurse Practitioner

## 2012-04-25 ENCOUNTER — Emergency Department (HOSPITAL_COMMUNITY)
Admission: EM | Admit: 2012-04-25 | Discharge: 2012-04-25 | Disposition: A | Discharge status: Home / Self Care | Payer: Medicaid Other | Attending: Emergency Medicine | Admitting: Emergency Medicine

## 2012-04-25 DIAGNOSIS — F411 Generalized anxiety disorder: Secondary | ICD-10-CM | POA: Insufficient documentation

## 2012-04-25 DIAGNOSIS — N189 Chronic kidney disease, unspecified: Secondary | ICD-10-CM | POA: Insufficient documentation

## 2012-04-25 DIAGNOSIS — Y929 Unspecified place or not applicable: Secondary | ICD-10-CM | POA: Insufficient documentation

## 2012-04-25 DIAGNOSIS — R05 Cough: Secondary | ICD-10-CM | POA: Insufficient documentation

## 2012-04-25 DIAGNOSIS — G8929 Other chronic pain: Secondary | ICD-10-CM | POA: Insufficient documentation

## 2012-04-25 DIAGNOSIS — F329 Major depressive disorder, single episode, unspecified: Secondary | ICD-10-CM | POA: Insufficient documentation

## 2012-04-25 DIAGNOSIS — Z8719 Personal history of other diseases of the digestive system: Secondary | ICD-10-CM | POA: Insufficient documentation

## 2012-04-25 DIAGNOSIS — K219 Gastro-esophageal reflux disease without esophagitis: Secondary | ICD-10-CM | POA: Insufficient documentation

## 2012-04-25 DIAGNOSIS — R0781 Pleurodynia: Secondary | ICD-10-CM

## 2012-04-25 DIAGNOSIS — Z9889 Other specified postprocedural states: Secondary | ICD-10-CM | POA: Insufficient documentation

## 2012-04-25 DIAGNOSIS — F3289 Other specified depressive episodes: Secondary | ICD-10-CM | POA: Insufficient documentation

## 2012-04-25 DIAGNOSIS — Z8739 Personal history of other diseases of the musculoskeletal system and connective tissue: Secondary | ICD-10-CM | POA: Insufficient documentation

## 2012-04-25 DIAGNOSIS — E119 Type 2 diabetes mellitus without complications: Secondary | ICD-10-CM | POA: Insufficient documentation

## 2012-04-25 DIAGNOSIS — W1809XA Striking against other object with subsequent fall, initial encounter: Secondary | ICD-10-CM | POA: Insufficient documentation

## 2012-04-25 DIAGNOSIS — S298XXA Other specified injuries of thorax, initial encounter: Secondary | ICD-10-CM | POA: Insufficient documentation

## 2012-04-25 DIAGNOSIS — Z87891 Personal history of nicotine dependence: Secondary | ICD-10-CM | POA: Insufficient documentation

## 2012-04-25 DIAGNOSIS — Z79899 Other long term (current) drug therapy: Secondary | ICD-10-CM | POA: Insufficient documentation

## 2012-04-25 DIAGNOSIS — I129 Hypertensive chronic kidney disease with stage 1 through stage 4 chronic kidney disease, or unspecified chronic kidney disease: Secondary | ICD-10-CM | POA: Insufficient documentation

## 2012-04-25 DIAGNOSIS — Y939 Activity, unspecified: Secondary | ICD-10-CM | POA: Insufficient documentation

## 2012-04-25 DIAGNOSIS — Z8711 Personal history of peptic ulcer disease: Secondary | ICD-10-CM | POA: Insufficient documentation

## 2012-04-25 DIAGNOSIS — R059 Cough, unspecified: Secondary | ICD-10-CM | POA: Insufficient documentation

## 2012-04-25 MED ORDER — LORAZEPAM 1 MG PO TABS: 0.5000 mg | ORAL_TABLET | Freq: Three times a day (TID) | ORAL | Status: DC | PRN

## 2012-04-25 MED ORDER — LORAZEPAM 1 MG PO TABS
1.0000 mg | ORAL_TABLET | Freq: Once | ORAL | Status: AC
  Administered 2012-04-25: 1 mg via ORAL
  Filled 2012-04-25: qty 2

## 2012-04-25 MED ORDER — ONDANSETRON HCL 4 MG PO TABS: 4.0000 mg | ORAL_TABLET | Freq: Four times a day (QID) | ORAL | Status: DC

## 2012-04-25 MED ORDER — OXYCODONE-ACETAMINOPHEN 5-325 MG PO TABS
2.0000 | ORAL_TABLET | Freq: Once | ORAL | Status: AC
  Administered 2012-04-25: 2 via ORAL
  Filled 2012-04-25: qty 2

## 2012-04-25 NOTE — ED Notes (Signed)
Pt given discharge paperwork.  No additional questions by pt regarding d/c. Verbalized understanding of d/c and f/u.  VSS.  E-signature obtained.   

## 2012-04-25 NOTE — ED Notes (Signed)
Pt states she fell yesterday and has been having R sided lower back pain since. Hurts to cough and breathe. Was seen at [facility] ed yesterday and xray showed rib contusions. Pt was given vicodin for pain but she was unable to tolerate because it made her sick to her stomach.

## 2012-04-25 NOTE — ED Provider Notes (Signed)
History    This chart was scribed for non-physician practitioner Francee Piccolo, PA-C working with Laray Anger, DO by Gerlean Ren, ED Scribe. This patient was seen in room TR05C/TR05C and the patient's care was started at 5:49 PM.    CSN: [id number]  Arrival date & time 04/25/12  1547   First MD Initiated Contact with Patient 04/25/12 1743      Chief Complaint  Patient presents with  . Back Pain     The history is provided by the patient. No language interpreter was used.  Michelle [name] is a 47 y.o. female who presents to the Emergency Department complaining of constant right side rib pain after tripping and falling backwards hitting the area against someone's shoulder yesterday morning.  No LOC or head trauma.  Pt was seen at [facility] yesterday after the fall and diagnosed with right side rib contusion and given short-term Vicodin prescription.  Pt reports she received Toradol at [facility] with no improvements to pain.  Pain is worsened by breathing and all movements.  Pt expresses concern that there is internal damage due to the ongoing pain which prevented normal sleep last night and requests further imaging. Pt has h/o chronic back pain, DM, and CKD.    Past Medical History  Diagnosis Date  . Asthma   . GERD (gastroesophageal reflux disease)   . Peptic ulcer disease 1984  . Episodic tension type headache   . Arthritis     hips, knees and spine  . Anxiety   . Depression   . Carpal tunnel syndrome of left wrist   . Barrett's esophagus   . Gastroparesis   . DM (diabetes mellitus)   . IBS (irritable bowel syndrome)   . Chronic kidney disease     Past Surgical History  Procedure Laterality Date  . Cervical fusion  2000    x2  . Joint replacement  2009    left knee x3  . Back surgery  2008    lumbar  fusion  . Cholecystectomy  1991  . Carpal tunnel release  2011    right hand  . Carpal tunnel release  11/23/2010    Procedure: CARPAL TUNNEL RELEASE;   Surgeon: Cammy Copa;  Location: [facility] OR;  Service: Orthopedics;  Laterality: Left;  revision left carpal tunnel release neurolysis of median nerve  . Esophagogastroduodenoscopy  03/20/2011    Procedure: ESOPHAGOGASTRODUODENOSCOPY (EGD);  Surgeon: Hilarie Fredrickson, MD;  Location: [facility] ENDOSCOPY;  Service: Endoscopy;  Laterality: N/A;  Huntley Dec /ja  . Knee arthroscopy      right  . Carpectomy  10/05/2011    Procedure: CARPECTOMY;  Surgeon: Tami Ribas, MD;  Location: [facility];  Service: Orthopedics;  Laterality: Left;  left proximal row carpectomy bone grafting capitate and radial styloidectomy    Family History  Problem Relation Age of Onset  . Esophageal cancer Father   . Clotting disorder Daughter     History  Substance Use Topics  . Smoking status: Former Smoker -- 0.50 packs/day for 30 years    Types: Cigarettes    Quit date: 04/28/2011  . Smokeless tobacco: Never Used  . Alcohol Use: No    No OB history provided.   Review of Systems  Musculoskeletal: Positive for back pain.  All other systems reviewed and are negative.    Allergies  Codeine and Penicillins  Home Medications   Current Outpatient Rx  Name  Route  Sig  Dispense  Refill  .  clonazePAM (KLONOPIN) 1 MG tablet   Oral   Take 1 mg by mouth 3 (three) times daily as needed for anxiety.         . DULoxetine (CYMBALTA) 30 MG capsule   Oral   Take 30 mg by mouth 2 (two) times daily.         . furosemide (LASIX) 20 MG tablet   Oral   Take 20 mg by mouth daily.         Marland Kitchen gabapentin (NEURONTIN) 300 MG capsule   Oral   Take 300 mg by mouth 2 (two) times daily.          Marland Kitchen HYDROcodone-acetaminophen (NORCO/VICODIN) 5-325 MG per tablet   Oral   Take 1 tablet by mouth every 4 (four) hours as needed for pain.   10 tablet   0   . lamoTRIgine (LAMICTAL) 25 MG tablet   Oral   Take 25 mg by mouth 2 (two) times daily.         . methocarbamol (ROBAXIN) 500 MG tablet   Oral   Take 1 tablet  (500 mg total) by mouth 2 (two) times daily.   10 tablet   0   . omeprazole (PRILOSEC) 40 MG capsule   Oral   Take 40 mg by mouth daily.         . traZODone (DESYREL) 50 MG tablet   Oral   Take 50 mg by mouth at bedtime.           BP 116/86  Pulse 79  Temp(Src) 98 F (36.7 C) (Oral)  Resp 20  SpO2 97%  LMP 02/27/2011  Physical Exam  Nursing note and vitals reviewed. Constitutional: She is oriented to person, place, and time. She appears well-developed and well-nourished. No distress.  Pt is tearful  HENT:  Head: Normocephalic and atraumatic.  Eyes: EOM are normal.  Neck: Neck supple. No tracheal deviation present.  Cardiovascular: Normal rate, regular rhythm and normal heart sounds.   Pulmonary/Chest: Effort normal and breath sounds normal. No respiratory distress. She has no wheezes.  Abdominal: Soft. She exhibits no distension. There is no rebound and no guarding.  Musculoskeletal: Normal range of motion.  Neurological: She is alert and oriented to person, place, and time.  Skin: Skin is warm and dry.  Psychiatric:  Pt is anxious    ED Course  Procedures (including critical care time)  Medications  LORazepam (ATIVAN) tablet 1 mg (1 mg Oral Given 04/25/12 1813)  oxyCODONE-acetaminophen (PERCOCET/ROXICET) 5-325 MG per tablet 2 tablet (2 tablets Oral Given 04/25/12 1813)    DIAGNOSTIC STUDIES: Oxygen Saturation is 97% on room air, adequate by my interpretation.    COORDINATION OF CARE: 5:59 PM- Informed pt that rib contusions are extremely painful and that further imaging is most likely a greater risk than necessary at this time.  Discussed pain relief here and short term pain medicine.  Pt verbalizes understanding and agrees with plan.      1. Rib pain       MDM  Patient was recently seen in the ED the other day for rib contusions sustained after a fall. She was treated appropriately and given a prescription for vicodin and follow up with her PCP.  Patient does not have new or differing complaints from previous visit. I have reviewed records of multiple ED visits with similar or other pain related complaints, usually with negative workups.  I feel that the pt's pain is chronic and cannot appropriately or  safely treated in an emergency department setting.  I do not feel that providing more narcotic pain medication is in this pt's best interest.  I have urged the pt to follow up closely with their PCP or pain specialist.  I have explicitly discussed with the pt return precautions and have reassured him that he can always be seen and evaluated in the emergency department for any condition that he feels is emergent, and that he will be given treatment as the emergency physician feels is appropriate and safe, but this may not involve the use of narcotic pain medications.  The pt was given the opportunity to voice any further questions or concerns and these were addressed to the best of my ability. PCP did receive zofran to premedicate with vicodin and ativan to help her relax. Patient agreeable to plan. Patient is stable at time of discharge       I personally performed the services described in this documentation, which was scribed in my presence. The recorded information has been reviewed and is accurate.     Jeannetta Ellis, PA-C 04/26/12 [phone]

## 2012-04-25 NOTE — ED Provider Notes (Signed)
Medical screening examination/treatment/procedure(s) were performed by non-physician practitioner and as supervising physician I was immediately available for consultation/collaboration.   Kenneshia Rehm L Lupie Sawa, MD 04/25/12 0748 

## 2012-04-26 ENCOUNTER — Ambulatory Visit (AMBULATORY_SURGERY_CENTER): Payer: Medicaid Other

## 2012-04-26 VITALS — Ht 68.5 in | Wt 316.2 lb

## 2012-04-26 DIAGNOSIS — K219 Gastro-esophageal reflux disease without esophagitis: Secondary | ICD-10-CM

## 2012-04-27 ENCOUNTER — Emergency Department (HOSPITAL_COMMUNITY): Payer: Medicaid Other

## 2012-04-27 ENCOUNTER — Encounter (HOSPITAL_COMMUNITY): Payer: Self-pay | Admitting: *Deleted

## 2012-04-27 ENCOUNTER — Emergency Department (HOSPITAL_COMMUNITY)
Admission: EM | Admit: 2012-04-27 | Discharge: 2012-04-27 | Disposition: A | Discharge status: Home / Self Care | Payer: Medicaid Other | Attending: Emergency Medicine | Admitting: Emergency Medicine

## 2012-04-27 DIAGNOSIS — J45909 Unspecified asthma, uncomplicated: Secondary | ICD-10-CM | POA: Insufficient documentation

## 2012-04-27 DIAGNOSIS — F3289 Other specified depressive episodes: Secondary | ICD-10-CM | POA: Insufficient documentation

## 2012-04-27 DIAGNOSIS — Z87891 Personal history of nicotine dependence: Secondary | ICD-10-CM | POA: Insufficient documentation

## 2012-04-27 DIAGNOSIS — Z79899 Other long term (current) drug therapy: Secondary | ICD-10-CM | POA: Insufficient documentation

## 2012-04-27 DIAGNOSIS — IMO0002 Reserved for concepts with insufficient information to code with codable children: Secondary | ICD-10-CM | POA: Insufficient documentation

## 2012-04-27 DIAGNOSIS — N189 Chronic kidney disease, unspecified: Secondary | ICD-10-CM | POA: Insufficient documentation

## 2012-04-27 DIAGNOSIS — Y939 Activity, unspecified: Secondary | ICD-10-CM | POA: Insufficient documentation

## 2012-04-27 DIAGNOSIS — R296 Repeated falls: Secondary | ICD-10-CM | POA: Insufficient documentation

## 2012-04-27 DIAGNOSIS — T148XXA Other injury of unspecified body region, initial encounter: Secondary | ICD-10-CM

## 2012-04-27 DIAGNOSIS — K219 Gastro-esophageal reflux disease without esophagitis: Secondary | ICD-10-CM | POA: Insufficient documentation

## 2012-04-27 DIAGNOSIS — F411 Generalized anxiety disorder: Secondary | ICD-10-CM | POA: Insufficient documentation

## 2012-04-27 DIAGNOSIS — E119 Type 2 diabetes mellitus without complications: Secondary | ICD-10-CM | POA: Insufficient documentation

## 2012-04-27 DIAGNOSIS — Z8669 Personal history of other diseases of the nervous system and sense organs: Secondary | ICD-10-CM | POA: Insufficient documentation

## 2012-04-27 DIAGNOSIS — Z8711 Personal history of peptic ulcer disease: Secondary | ICD-10-CM | POA: Insufficient documentation

## 2012-04-27 DIAGNOSIS — Z8739 Personal history of other diseases of the musculoskeletal system and connective tissue: Secondary | ICD-10-CM | POA: Insufficient documentation

## 2012-04-27 DIAGNOSIS — F329 Major depressive disorder, single episode, unspecified: Secondary | ICD-10-CM | POA: Insufficient documentation

## 2012-04-27 DIAGNOSIS — Y929 Unspecified place or not applicable: Secondary | ICD-10-CM | POA: Insufficient documentation

## 2012-04-27 DIAGNOSIS — Z8719 Personal history of other diseases of the digestive system: Secondary | ICD-10-CM | POA: Insufficient documentation

## 2012-04-27 MED ORDER — KETOROLAC TROMETHAMINE 60 MG/2ML IM SOLN
60.0000 mg | Freq: Once | INTRAMUSCULAR | Status: DC
  Filled 2012-04-27: qty 2

## 2012-04-27 MED ORDER — DIAZEPAM 5 MG PO TABS
10.0000 mg | ORAL_TABLET | Freq: Once | ORAL | Status: AC
  Administered 2012-04-27: 10 mg via ORAL
  Filled 2012-04-27: qty 2

## 2012-04-27 MED ORDER — DIAZEPAM 5 MG PO TABS: 5.0000 mg | ORAL_TABLET | ORAL | Status: DC | PRN

## 2012-04-27 MED ORDER — OXYCODONE-ACETAMINOPHEN 5-325 MG PO TABS
2.0000 | ORAL_TABLET | Freq: Once | ORAL | Status: AC
  Administered 2012-04-27: 2 via ORAL
  Filled 2012-04-27: qty 2

## 2012-04-27 NOTE — ED Notes (Signed)
Pt reports being seen here for same Tuesday.  Larey Seat on her son-in-law "backwards".  Pt reports severe back pain.  Unable to move d/t pain.  Pt reports walking across the parking lot to the ED entrance.

## 2012-04-27 NOTE — ED Notes (Signed)
I spoke with pt per her request. Pt states she wants another doctor d/t "my doctor was mean and rude to me", "I was crying cause I'm in so much pain". Pt was informed that she is not able to choose her doctor here in the emergency room but she can choose her PCP. Pt stated she was upset d/t the EDP Allen wouldn't order her pain meds through a shot or IV. Pt did receive PO percocet's and valium that she was not happy with. Pt became very irritated and anxious while talking to me. Pt insisted that she was not receiving good care. I educated pt on our policy and procedures about pain meds. Pt was a/o x4 and in no acute distress. Pt was c/o of a fall 2days ago with no brusing noted and moving all extremities. I gave pt a boucher with patient experiences number on it and I explained it to her. Pt did not want to hear anything I had to say, pt kept talking about not getting the dilaudid shot or IV that she needed and deserved. Pt at this point was screaming at me so I walked out and EDP Freida Busman and myself went back in and spoke to pt. Pt proceeded to tell us she was calling news 2 to complain on [place]. Pt then requested a rx for percocet's in which EDP Freida Busman already explained to pt he would not give her narcotics but he did give her a valium rx and said offered her a ultram rx but she refused it. Pt was discharged to f/u with her PCP. On discharge pt was standing at the door waiting on a wheelchair and proceeds to talk very loud in the hall how we didn't do anything for her. I asked pt to lower her voice and she said "why do y'all care, you don't care about my pain that's a 12". Pt escorted by RN to discharge desk. I apologized multiple times to her that we were unable to satisfy her and she said to me "stop saying your sorry and give me something for pain. You give dilaudid to people with a cut on there thumb and you are treating me like an animal."

## 2012-04-27 NOTE — ED Notes (Addendum)
Pt agitated, states the staff here is rude, believes additional tests should be ordered other than xrays (states she needs an MRI). States she is in pain and "needs IV medication". Charge nurse notified.

## 2012-04-27 NOTE — ED Provider Notes (Signed)
Medical screening examination/treatment/procedure(s) were performed by non-physician practitioner and as supervising physician I was immediately available for consultation/collaboration.   Devean Skoczylas M Kiarra Kidd, DO 04/27/12 1531 

## 2012-04-27 NOTE — ED Provider Notes (Addendum)
History     CSN: [id number]  Arrival date & time 04/27/12  0930   First MD Initiated Contact with Patient 04/27/12 [phone]      Chief Complaint  Patient presents with  . Back Pain    (Consider location/radiation/quality/duration/timing/severity/associated sxs/prior treatment) Patient is a 47 y.o. [name] presenting with back pain. The history is provided by the patient.  Back Pain  patient here complaining of right thoracic back pain x3 days after sustaining a fall. Pain described as sharp and worse with movement. Seen here for this and had negative x-ray at this time. States her pain is worse with movement taking a deep breath. Was given a prescription for Vicodin 2 days ago. Does have a history of polysubstance abuse in the past. Denies any lower extreme weakness. No change in bowel or bladder function Past Medical History  Diagnosis Date  . Asthma   . GERD (gastroesophageal reflux disease)   . Peptic ulcer disease 1984  . Episodic tension type headache   . Arthritis     hips, knees and spine  . Anxiety   . Depression   . Carpal tunnel syndrome of left wrist   . Barrett's esophagus   . Gastroparesis   . DM (diabetes mellitus)   . IBS (irritable bowel syndrome)   . Chronic kidney disease     Past Surgical History  Procedure Laterality Date  . Cervical fusion  2000    x2  . Joint replacement  2009    left knee x3  . Back surgery  2008    lumbar  fusion  . Cholecystectomy  1991  . Carpal tunnel release  2011    right hand  . Carpal tunnel release  11/23/2010    Procedure: CARPAL TUNNEL RELEASE;  Surgeon: Cammy Copa;  Location: [facility] OR;  Service: Orthopedics;  Laterality: Left;  revision left carpal tunnel release neurolysis of median nerve  . Esophagogastroduodenoscopy  03/20/2011    Procedure: ESOPHAGOGASTRODUODENOSCOPY (EGD);  Surgeon: Hilarie Fredrickson, MD;  Location: [facility] ENDOSCOPY;  Service: Endoscopy;  Laterality: N/A;  [place] Dec /ja  . Knee arthroscopy      right  .  Carpectomy  9/Michelle/2013    Procedure: CARPECTOMY;  Surgeon: Tami Ribas, MD;  Location: [facility];  Service: Orthopedics;  Laterality: Left;  left proximal row carpectomy bone grafting capitate and radial styloidectomy    Family History  Problem Relation Age of Onset  . Esophageal cancer Father   . Clotting disorder Daughter   . Colon cancer Neg Hx     History  Substance Use Topics  . Smoking status: Former Smoker -- 0.50 packs/day for 30 years    Types: Cigarettes    Quit date: 04/28/2011  . Smokeless tobacco: Never Used  . Alcohol Use: No    OB History   Grav Para Term Preterm Abortions TAB SAB Ect Mult Living                  Review of Systems  Musculoskeletal: Positive for back pain.  All other systems reviewed and are negative.    Allergies  Codeine and Penicillins  Home Medications   Current Outpatient Rx  Name  Route  Sig  Dispense  Refill  . clonazePAM (KLONOPIN) 1 MG tablet   Oral   Take 1 mg by mouth 3 (three) times daily as needed for anxiety.         . DULoxetine (CYMBALTA) 30 MG capsule   Oral  Take 30 mg by mouth 2 (two) times daily.         . furosemide (LASIX) 20 MG tablet   Oral   Take 20 mg by mouth daily.         Marland Kitchen gabapentin (NEURONTIN) 300 MG capsule   Oral   Take 300 mg by mouth 2 (two) times daily.          Marland Kitchen HYDROcodone-acetaminophen (NORCO/VICODIN) 5-325 MG per tablet   Oral   Take 1 tablet by mouth every 4 (four) hours as needed for pain.         Marland Kitchen lamoTRIgine (LAMICTAL) 25 MG tablet   Oral   Take 25 mg by mouth 2 (two) times daily.         Marland Kitchen LORazepam (ATIVAN) 1 MG tablet   Oral   Take 0.5 tablets (0.5 mg total) by mouth every 8 (eight) hours as needed for anxiety.   8 tablet   0   . methocarbamol (ROBAXIN) 500 MG tablet   Oral   Take 500 mg by mouth 2 (two) times daily.         Marland Kitchen omeprazole (PRILOSEC) 40 MG capsule   Oral   Take 40 mg by mouth daily.         . ondansetron (ZOFRAN)  4 MG tablet   Oral   Take 1 tablet (4 mg total) by mouth every 6 (six) hours.   12 tablet   0   . traZODone (DESYREL) 50 MG tablet   Oral   Take 50 mg by mouth at bedtime.           BP 142/109  Pulse 100  Temp(Src) 98.2 F (36.8 C) (Oral)  Resp 18  SpO2 95%  LMP 02/27/2011  Physical Exam  Nursing note and vitals reviewed. Constitutional: She is oriented to person, place, and time. She appears well-developed and well-nourished.  Non-toxic appearance. No distress.  HENT:  Head: Normocephalic and atraumatic.  Eyes: Conjunctivae, EOM and lids are normal. Pupils are equal, round, and reactive to light.  Neck: Normal range of motion. Neck supple. No tracheal deviation present. No mass present.  Cardiovascular: Normal rate, regular rhythm and normal heart sounds.  Exam reveals no gallop.   No murmur heard. Pulmonary/Chest: Effort normal and breath sounds normal. No stridor. No respiratory distress. She has no decreased breath sounds. She has no wheezes. She has no rhonchi. She has no rales.  Abdominal: Soft. Normal appearance and bowel sounds are normal. She exhibits no distension. There is no tenderness. There is no rebound and no CVA tenderness.  Musculoskeletal: Normal range of motion. She exhibits no edema and no tenderness.       Back:  Neurological: She is alert and oriented to person, place, and time. She has normal strength. No cranial nerve deficit or sensory deficit. GCS eye subscore is 4. GCS verbal subscore is 5. GCS motor subscore is 6.  Skin: Skin is warm and dry. No abrasion and no rash noted.  Psychiatric: She has a normal mood and affect. Her speech is normal and behavior is normal.    ED Course  Procedures (including critical care time)  Labs Reviewed - No data to display No results found.   No diagnosis found.    MDM  Pt given  Percocet and Valium here. X-rays are negative. Patient's skin exam is without signs of bruising or trauma. Patient has been  very upset and has been combative. She is requesting opiate medications  at this time which I have stated that she does not have indication for. I offered the patient Ultram to which she responded " I don't want no tramadol". She became very boisterous and demand to speak with the charge nurse. Both myself and charge nurse spoke with the patient and she still is upset that she is not receiving opiate medications. I will prescribe for her Valium for muscle relaxation as well as for her anxiety which she appears to have. She is instructed to followup with her doctor if she requires any opiate medication.        Toy Baker, MD 04/27/12 1057  Toy Baker, MD 04/27/12 1100

## 2012-04-27 NOTE — Discharge Instructions (Signed)
Muscle Strain °Muscle strain occurs when a muscle is stretched beyond its normal length. A small number of muscle fibers generally are torn. This is especially common in athletes. This happens when a sudden, violent force placed on a muscle stretches it too far. Usually, recovery from muscle strain takes 1 to 2 weeks. Complete healing will take 5 to 6 weeks.  °HOME CARE INSTRUCTIONS  °· While awake, apply ice to the sore muscle for the first 2 days after the injury. °· Put ice in a plastic bag. °· Place a towel between your skin and the bag. °· Leave the ice on for 15 to 20 minutes each hour. °· Do not use the strained muscle for several days, until you no longer have pain. °· You may wrap the injured area with an elastic bandage for comfort. Be careful not to wrap it too tightly. This may interfere with blood circulation or increase swelling. °· Only take over-the-counter or prescription medicines for pain, discomfort, or fever as directed by your caregiver. °SEEK MEDICAL CARE IF:  °You have increasing pain or swelling in the injured area. °MAKE SURE YOU:  °· Understand these instructions. °· Will watch your condition. °· Will get help right away if you are not doing well or get worse. °Document Released: 12/28/2004 Document Revised: 03/22/2011 Document Reviewed: 01/09/2011 °ExitCare® Patient Information ©2013 [employer]. ° °

## 2012-04-29 ENCOUNTER — Encounter (HOSPITAL_COMMUNITY): Payer: Self-pay | Admitting: *Deleted

## 2012-04-29 ENCOUNTER — Emergency Department (HOSPITAL_COMMUNITY): Payer: Medicaid Other

## 2012-04-29 ENCOUNTER — Emergency Department (HOSPITAL_COMMUNITY)
Admission: EM | Admit: 2012-04-29 | Discharge: 2012-04-29 | Disposition: A | Discharge status: Home / Self Care | Payer: Medicaid Other | Attending: Emergency Medicine | Admitting: Emergency Medicine

## 2012-04-29 DIAGNOSIS — Z79899 Other long term (current) drug therapy: Secondary | ICD-10-CM | POA: Insufficient documentation

## 2012-04-29 DIAGNOSIS — E1129 Type 2 diabetes mellitus with other diabetic kidney complication: Secondary | ICD-10-CM | POA: Insufficient documentation

## 2012-04-29 DIAGNOSIS — F17200 Nicotine dependence, unspecified, uncomplicated: Secondary | ICD-10-CM | POA: Insufficient documentation

## 2012-04-29 DIAGNOSIS — Z8711 Personal history of peptic ulcer disease: Secondary | ICD-10-CM | POA: Insufficient documentation

## 2012-04-29 DIAGNOSIS — G8929 Other chronic pain: Secondary | ICD-10-CM

## 2012-04-29 DIAGNOSIS — Z8719 Personal history of other diseases of the digestive system: Secondary | ICD-10-CM | POA: Insufficient documentation

## 2012-04-29 DIAGNOSIS — M545 Low back pain, unspecified: Secondary | ICD-10-CM | POA: Insufficient documentation

## 2012-04-29 DIAGNOSIS — Z8739 Personal history of other diseases of the musculoskeletal system and connective tissue: Secondary | ICD-10-CM | POA: Insufficient documentation

## 2012-04-29 DIAGNOSIS — F1110 Opioid abuse, uncomplicated: Secondary | ICD-10-CM | POA: Insufficient documentation

## 2012-04-29 DIAGNOSIS — Z8669 Personal history of other diseases of the nervous system and sense organs: Secondary | ICD-10-CM | POA: Insufficient documentation

## 2012-04-29 DIAGNOSIS — W19XXXA Unspecified fall, initial encounter: Secondary | ICD-10-CM

## 2012-04-29 DIAGNOSIS — F411 Generalized anxiety disorder: Secondary | ICD-10-CM | POA: Insufficient documentation

## 2012-04-29 DIAGNOSIS — N189 Chronic kidney disease, unspecified: Secondary | ICD-10-CM | POA: Insufficient documentation

## 2012-04-29 DIAGNOSIS — G8921 Chronic pain due to trauma: Secondary | ICD-10-CM | POA: Insufficient documentation

## 2012-04-29 DIAGNOSIS — F329 Major depressive disorder, single episode, unspecified: Secondary | ICD-10-CM | POA: Insufficient documentation

## 2012-04-29 DIAGNOSIS — Z8679 Personal history of other diseases of the circulatory system: Secondary | ICD-10-CM | POA: Insufficient documentation

## 2012-04-29 DIAGNOSIS — F3289 Other specified depressive episodes: Secondary | ICD-10-CM | POA: Insufficient documentation

## 2012-04-29 DIAGNOSIS — J45909 Unspecified asthma, uncomplicated: Secondary | ICD-10-CM | POA: Insufficient documentation

## 2012-04-29 MED ORDER — HYDROMORPHONE HCL PF 1 MG/ML IJ SOLN
1.0000 mg | Freq: Once | INTRAMUSCULAR | Status: AC
  Administered 2012-04-29: 1 mg via INTRAMUSCULAR
  Filled 2012-04-29: qty 1

## 2012-04-29 MED ORDER — KETOROLAC TROMETHAMINE 60 MG/2ML IM SOLN
60.0000 mg | Freq: Once | INTRAMUSCULAR | Status: AC
  Administered 2012-04-29: 60 mg via INTRAMUSCULAR
  Filled 2012-04-29: qty 2

## 2012-04-29 NOTE — ED Provider Notes (Signed)
History     CSN: [id number]  Arrival date & time 04/29/12  1602   First MD Initiated Contact with Patient 04/29/12 1606      Chief Complaint  Patient presents with  . Back Pain    (Consider location/radiation/quality/duration/timing/severity/associated sxs/prior treatment) Patient is a 47 y.o. female presenting with back pain. The history is provided by the patient, the EMS personnel and medical records.  Back Pain Pain location: right sided mid-back. Quality:  Aching Radiates to:  Does not radiate Pain severity:  Moderate Worse during: since a fall (slipped and fell on water in her kitchen) Progression:  Waxing and waning Chronicity:  Chronic Context comment:  Seen in ED three times previously this week for chronic pain in right back Associated symptoms: no abdominal pain, no bladder incontinence, no bowel incontinence, no chest pain, no dysuria, no fever, no numbness, no paresthesias, no pelvic pain, no perianal numbness, no tingling and no weakness   Risk factors comment:  History of chronic back pain   Past Medical History  Diagnosis Date  . Asthma   . GERD (gastroesophageal reflux disease)   . Peptic ulcer disease 1984  . Episodic tension type headache   . Arthritis     hips, knees and spine  . Anxiety   . Depression   . Carpal tunnel syndrome of left wrist   . Barrett's esophagus   . Gastroparesis   . DM (diabetes mellitus)   . IBS (irritable bowel syndrome)   . Chronic kidney disease   . Narcotic abuse     Past Surgical History  Procedure Laterality Date  . Cervical fusion  2000    x2  . Joint replacement  2009    left knee x3  . Back surgery  2008    lumbar  fusion  . Cholecystectomy  1991  . Carpal tunnel release  2011    right hand  . Carpal tunnel release  11/23/2010    Procedure: CARPAL TUNNEL RELEASE;  Surgeon: Cammy Copa;  Location: [facility] OR;  Service: Orthopedics;  Laterality: Left;  revision left carpal tunnel release neurolysis of  median nerve  . Esophagogastroduodenoscopy  03/20/2011    Procedure: ESOPHAGOGASTRODUODENOSCOPY (EGD);  Surgeon: Hilarie Fredrickson, MD;  Location: [facility] ENDOSCOPY;  Service: Endoscopy;  Laterality: N/A;  [place] Dec /ja  . Knee arthroscopy      right  . Carpectomy  10/05/2011    Procedure: CARPECTOMY;  Surgeon: Tami Ribas, MD;  Location: [facility];  Service: Orthopedics;  Laterality: Left;  left proximal row carpectomy bone grafting capitate and radial styloidectomy    Family History  Problem Relation Age of Onset  . Esophageal cancer Father   . Clotting disorder Daughter   . Colon cancer Neg Hx     History  Substance Use Topics  . Smoking status: Current Every Day Smoker -- 0.50 packs/day for 30 years    Types: Cigarettes    Last Attempt to Quit: 04/28/2011  . Smokeless tobacco: Never Used  . Alcohol Use: No    OB History   Grav Para Term Preterm Abortions TAB SAB Ect Mult Living                  Review of Systems  Constitutional: Negative for fever and chills.  HENT: Negative for neck pain and neck stiffness.   Respiratory: Negative for cough, chest tightness, shortness of breath and wheezing.   Cardiovascular: Negative for chest pain and palpitations.  Gastrointestinal: Negative  for vomiting, abdominal pain, diarrhea, constipation and bowel incontinence.  Genitourinary: Negative for bladder incontinence, dysuria, decreased urine volume, difficulty urinating and pelvic pain.  Musculoskeletal: Positive for back pain. Negative for myalgias, joint swelling, arthralgias and gait problem.  Skin: Negative for rash and wound.  Neurological: Negative for tingling, syncope, weakness, light-headedness, numbness and paresthesias.  Psychiatric/Behavioral: Negative for confusion and agitation.  All other systems reviewed and are negative.    Allergies  Codeine and Penicillins  Home Medications   Current Outpatient Rx  Name  Route  Sig  Dispense  Refill  . clonazePAM  (KLONOPIN) 1 MG tablet   Oral   Take 1 mg by mouth 3 (three) times daily as needed for anxiety.         . diazepam (VALIUM) 5 MG tablet   Oral   Take 1 tablet (5 mg total) by mouth every 4 (four) hours as needed (muscle spasm).   10 tablet   0   . DULoxetine (CYMBALTA) 30 MG capsule   Oral   Take 30 mg by mouth 2 (two) times daily.         . furosemide (LASIX) 20 MG tablet   Oral   Take 20 mg by mouth daily.         Marland Kitchen gabapentin (NEURONTIN) 300 MG capsule   Oral   Take 300 mg by mouth 2 (two) times daily.          Marland Kitchen HYDROcodone-acetaminophen (NORCO/VICODIN) 5-325 MG per tablet   Oral   Take 1 tablet by mouth every 4 (four) hours as needed for pain.         Marland Kitchen lamoTRIgine (LAMICTAL) 25 MG tablet   Oral   Take 25 mg by mouth 2 (two) times daily.         Marland Kitchen LORazepam (ATIVAN) 1 MG tablet   Oral   Take 0.5 tablets (0.5 mg total) by mouth every 8 (eight) hours as needed for anxiety.   8 tablet   0   . methocarbamol (ROBAXIN) 500 MG tablet   Oral   Take 500 mg by mouth 2 (two) times daily.         Marland Kitchen omeprazole (PRILOSEC) 40 MG capsule   Oral   Take 40 mg by mouth daily.         . ondansetron (ZOFRAN) 4 MG tablet   Oral   Take 1 tablet (4 mg total) by mouth every 6 (six) hours.   12 tablet   0   . traZODone (DESYREL) 50 MG tablet   Oral   Take 50 mg by mouth at bedtime.           LMP 02/27/2011  Physical Exam  Nursing note and vitals reviewed. Constitutional: She is oriented to person, place, and time. She appears well-developed and well-nourished.  HENT:  Head: Normocephalic and atraumatic.  Right Ear: External ear normal.  Left Ear: External ear normal.  Nose: Nose normal.  Mouth/Throat: Oropharynx is clear and moist. No oropharyngeal exudate.  Eyes: Conjunctivae are normal. Pupils are equal, round, and reactive to light.  Neck: Normal range of motion. Neck supple.  Cardiovascular: Normal rate, regular rhythm, normal heart sounds and  intact distal pulses.  Exam reveals no gallop and no friction rub.   No murmur heard. Pulmonary/Chest: Effort normal and breath sounds normal. No respiratory distress. She has no wheezes. She has no rales. She exhibits no tenderness.  Abdominal: Soft. Bowel sounds are normal. She exhibits no distension  and no mass. There is no tenderness. There is no rebound and no guarding.  Morbidly obese  Musculoskeletal: Normal range of motion. She exhibits no edema and no tenderness.       Arms: Neurological: She is alert and oriented to person, place, and time. She displays normal reflexes. No cranial nerve deficit. She exhibits normal muscle tone. Coordination normal.  Skin: Skin is warm and dry.  Psychiatric: She has a normal mood and affect. Her behavior is normal. Judgment and thought content normal.    ED Course  Procedures (including critical care time)  Labs Reviewed - No data to display Dg Chest 2 View  04/29/2012  *RADIOLOGY REPORT*  Clinical Data: Chest pain, low back pain.  CHEST - 2 VIEW  Comparison: 04/24/2012  Findings: Cardiomegaly with vascular congestion.  No confluent airspace opacities, effusions or overt edema.  No acute bony abnormality.  IMPRESSION: Cardiomegaly, vascular congestion.   Original Report Authenticated By: Charlett Nose, M.D.    Imaging viewed by myself and interpreted by radiology.  1. Chronic back pain   2. Fall, initial encounter       MDM  47 yo F presents after fall in which she says she slipped on water in her kitchen and fell backwards onto her back. No external evidence of trauma. TTP localized to right posterior costal margin. No midline thoracic or lumbar tenderness. No urinary hesitancy or fecal incontinence. Asking for IV narcotics due to pain. Review of medical records reveals this is pt's 4th visit in ED this week for same pain and that patient has been in 3 different pain clinics for her chronic back pain. Clinical picture not concerning for cauda  equina syndrome or traumatic injury to internal organs. Toradol administered. Plain films of site of pain taken and negative for evidence of fracture or pneumothorax. Pt discharged with instructions to obtain a new primary care physician if she is unhappy with her current PCP and on how to obtain a new PCP. Patient given return precautions, including worsening of signs or symptoms.      Clemetine Marker, MD 04/29/12 [phone]

## 2012-04-29 NOTE — ED Notes (Signed)
C/o back pain since Monday from a fall. Seen multiple times for same this week. Pt reports fell again today. No LOC.

## 2012-04-29 NOTE — ED Notes (Signed)
Patient transported to X-ray 

## 2012-05-02 NOTE — ED Provider Notes (Signed)
I saw and evaluated the patient, reviewed the resident's note and I agree with the findings and plan. Pt with hx chronic back pain/chronic pain, c/o low back pain. Spine nt. abd soft nt. Motor intact bil. No numbness/weakness. No fever or chills.   Suzi Roots, MD 05/02/12 [phone]

## 2012-05-10 ENCOUNTER — Encounter: Payer: Medicaid Other | Admitting: Internal Medicine

## 2012-05-10 ENCOUNTER — Encounter: Payer: Self-pay | Admitting: Internal Medicine

## 2012-05-10 ENCOUNTER — Ambulatory Visit (AMBULATORY_SURGERY_CENTER): Payer: Medicaid Other | Admitting: Internal Medicine

## 2012-05-10 VITALS — BP 147/85 | HR 59 | Temp 96.7°F | Resp 22 | Ht 68.5 in | Wt 316.0 lb

## 2012-05-10 DIAGNOSIS — K222 Esophageal obstruction: Secondary | ICD-10-CM

## 2012-05-10 DIAGNOSIS — K2270 Barrett's esophagus without dysplasia: Secondary | ICD-10-CM

## 2012-05-10 DIAGNOSIS — K219 Gastro-esophageal reflux disease without esophagitis: Secondary | ICD-10-CM

## 2012-05-10 DIAGNOSIS — R1310 Dysphagia, unspecified: Secondary | ICD-10-CM

## 2012-05-10 HISTORY — PX: ESOPHAGOGASTRODUODENOSCOPY (EGD) WITH ESOPHAGEAL DILATION: SHX5812

## 2012-05-10 MED ORDER — SODIUM CHLORIDE 0.9 % IV SOLN: 500.0000 mL | INTRAVENOUS | Status: DC

## 2012-05-10 NOTE — Op Note (Signed)
[facility] [address] [place], [address]   ENDOSCOPY PROCEDURE REPORT  PATIENT: Michelle, [name]  MR#: [id number] BIRTHDATE: 07/02/1965 , 47  yrs. old GENDER: Female ENDOSCOPIST: Roxy Cedar, MD REFERRED BY:  Surveillance Program Recall PROCEDURE DATE:  05/10/2012 PROCEDURE:  EGD w/ biopsies and Maloney dilation of esophagus  (60F) ASA CLASS:     Class III INDICATIONS:  follow up of Barrett's esophagus.   Dysphagia. MEDICATIONS: MAC sedation, administered by CRNA and propofol (Diprivan) 450mg  IV TOPICAL ANESTHETIC: Cetacaine Spray  DESCRIPTION OF PROCEDURE: After the risks benefits and alternatives of the procedure were thoroughly explained, informed consent was obtained.  The [facility] [id number] endoscope was introduced through the mouth and advanced to the second portion of the duodenum. Without limitations.  The instrument was slowly withdrawn as the mucosa was fully examined.      EXAM:The esophagus revealed Barrett's type mucosa extending from 28 cm to 36 cm from the incisors.  No active inflammation or nodularity.  Four-quadrant biopsies at least every 2 cm taken. There was a ringlike 15 mm stricture at the gastroesophageal junction.  The stomach revealed a sliding hiatal hernia as well as antral erythema.  Bulb revealed mild erythema and the post bulbar duodenum was normal.  THERAPY: A 54 French Maloney dilator was passed blindly into the esophagus.  No resistance.  Tolerated well.     .  COMPLICATIONS: There were no complications. ENDOSCOPIC IMPRESSION: 1. GERD complicated by Barrett's esophagus status post surveillance biopsies 2. Symptomatic esophageal stricture status post Elease Hashimoto dilation-54 [place]  RECOMMENDATIONS: 1.  Continue omeprazole daily 2.  Clear liquids until 1 PM, then soft foods rest of day.  Resume prior diet tomorrow. 3.  REPEAT SURVEILLANCE EGD IN 3 YEARS, if no dysplasia on biopsies  REPEAT  EXAM:  eSigned:  Roxy Cedar, MD 05/10/2012 11:10 AM [id number], Karren Burly MD and The Patient

## 2012-05-10 NOTE — Progress Notes (Signed)
Pt states she has constant, throbbing, numbing pain to Right mid back.  She fell several weeks ago and "bruised" her ribs.

## 2012-05-10 NOTE — Progress Notes (Signed)
Called to room to assist during endoscopic procedure.  Patient ID and intended procedure confirmed with present staff. Received instructions for my participation in the procedure from the performing physician.  

## 2012-05-10 NOTE — Patient Instructions (Addendum)
YOU HAD AN ENDOSCOPIC PROCEDURE TODAY AT THE [facility]: Refer to the procedure report that was given to you for any specific questions about what was found during the examination.  If the procedure report does not answer your questions, please call your gastroenterologist to clarify.  If you requested that your care partner not be given the details of your procedure findings, then the procedure report has been included in a sealed envelope for you to review at your convenience later.  YOU SHOULD EXPECT: Some feelings of bloating in the abdomen. Passage of more gas than usual.  Walking can help get rid of the air that was put into your GI tract during the procedure and reduce the bloating. If you had a lower endoscopy (such as a colonoscopy or flexible sigmoidoscopy) you may notice spotting of blood in your stool or on the toilet paper. If you underwent a bowel prep for your procedure, then you may not have a normal bowel movement for a few days.  DIET: ACTIVITY:  Due to the fact that you were dilated today, you may not have anything to drink or eat until 1:00pm.  After that you may eat a soft diet for the ret of the day just in case there is swelling in your esophagus.  We don't want you to choke, so please follow the directions.   Your care partner should take you home directly after the procedure.  You should plan to take it easy, moving slowly for the rest of the day.  You can resume normal activity the day after the procedure however you should NOT DRIVE or use heavy machinery for 24 hours (because of the sedation medicines used during the test).    SYMPTOMS TO REPORT IMMEDIATELY: A gastroenterologist can be reached at any hour.  During normal business hours, 8:30 AM to 5:00 PM Monday through Friday, call [phone].  After hours and on weekends, please call the GI answering service at [phone] who will take a message and have the physician on call contact you.   Following  upper endoscopy (EGD)  Vomiting of blood or coffee ground material  New chest pain or pain under the shoulder blades  Painful or persistently difficult swallowing  New shortness of breath  Fever of 100F or higher  Black, tarry-looking stools  FOLLOW UP: If any biopsies were taken you will be contacted by phone or by letter within the next 1-3 weeks.  Call your gastroenterologist if you have not heard about the biopsies in 3 weeks.  Our staff will call the home number listed on your records the next business day following your procedure to check on you and address any questions or concerns that you may have at that time regarding the information given to you following your procedure. This is a courtesy call and so if there is no answer at the home number and we have not heard from you through the emergency physician on call, we will assume that you have returned to your regular daily activities without incident.  Continue your omeprazole everyday.  We will need to see you in 3 years for another EGD.  SIGNATURES/CONFIDENTIALITY: You and/or your care partner have signed paperwork which will be entered into your electronic medical record.  These signatures attest to the fact that that the information above on your After Visit Summary has been reviewed and is understood.  Full responsibility of the confidentiality of this discharge information lies with you and/or your care-partner.

## 2012-05-10 NOTE — Progress Notes (Addendum)
Patient immediately asked me if the doctor could give her some pain medicine for her back.    The she asked for some for her throat, but denied throat pain at this time.  Patient did not have preoperative order for IV antibiotic SSI prophylaxis. [phone])  Patient did not experience any of the following events: a burn prior to discharge; a fall within the facility; wrong site/side/patient/procedure/implant event; or a hospital transfer or hospital admission upon discharge from the facility. [phone])   Patient still asking for pain meds for her back.

## 2012-05-11 ENCOUNTER — Telehealth: Payer: Self-pay | Admitting: *Deleted

## 2012-05-11 NOTE — Telephone Encounter (Signed)
  Follow up Call-  Call back number 05/10/2012 06/14/2011  Post procedure Call Back phone  # [phone]  Permission to leave phone message Yes Yes     No answer,left message.

## 2012-05-17 ENCOUNTER — Encounter: Payer: Self-pay | Admitting: Internal Medicine

## 2012-05-25 ENCOUNTER — Telehealth: Payer: Self-pay | Admitting: Internal Medicine

## 2012-05-25 NOTE — Telephone Encounter (Signed)
Pt had not received her result letter in the mail. Reviewed result letter with pt, questions answered.

## 2012-05-26 ENCOUNTER — Encounter (HOSPITAL_COMMUNITY): Payer: Self-pay | Admitting: Emergency Medicine

## 2012-05-26 ENCOUNTER — Emergency Department (HOSPITAL_COMMUNITY): Payer: Medicaid Other

## 2012-05-26 ENCOUNTER — Emergency Department (HOSPITAL_COMMUNITY)
Admission: EM | Admit: 2012-05-26 | Discharge: 2012-05-26 | Discharge status: Against Medical Advice | Payer: Medicaid Other | Attending: Emergency Medicine | Admitting: Emergency Medicine

## 2012-05-26 DIAGNOSIS — M7989 Other specified soft tissue disorders: Secondary | ICD-10-CM | POA: Insufficient documentation

## 2012-05-26 NOTE — ED Notes (Signed)
Pt called to be moved to an exam room; no answer x 1

## 2012-05-26 NOTE — ED Notes (Signed)
Pt c/o bilateral LE edema and right arm edema; pt sts hx of similar

## 2012-05-26 NOTE — ED Notes (Signed)
No answer

## 2012-07-09 ENCOUNTER — Emergency Department (HOSPITAL_COMMUNITY): Payer: Medicaid Other

## 2012-07-09 ENCOUNTER — Encounter (HOSPITAL_COMMUNITY): Payer: Self-pay | Admitting: Emergency Medicine

## 2012-07-09 ENCOUNTER — Emergency Department (HOSPITAL_COMMUNITY)
Admission: EM | Admit: 2012-07-09 | Discharge: 2012-07-09 | Disposition: A | Discharge status: Home / Self Care | Payer: Medicaid Other | Attending: Emergency Medicine | Admitting: Emergency Medicine

## 2012-07-09 DIAGNOSIS — E119 Type 2 diabetes mellitus without complications: Secondary | ICD-10-CM | POA: Insufficient documentation

## 2012-07-09 DIAGNOSIS — Z8659 Personal history of other mental and behavioral disorders: Secondary | ICD-10-CM | POA: Insufficient documentation

## 2012-07-09 DIAGNOSIS — F3289 Other specified depressive episodes: Secondary | ICD-10-CM | POA: Insufficient documentation

## 2012-07-09 DIAGNOSIS — K2270 Barrett's esophagus without dysplasia: Secondary | ICD-10-CM | POA: Insufficient documentation

## 2012-07-09 DIAGNOSIS — Z8711 Personal history of peptic ulcer disease: Secondary | ICD-10-CM | POA: Insufficient documentation

## 2012-07-09 DIAGNOSIS — Z8739 Personal history of other diseases of the musculoskeletal system and connective tissue: Secondary | ICD-10-CM | POA: Insufficient documentation

## 2012-07-09 DIAGNOSIS — X500XXA Overexertion from strenuous movement or load, initial encounter: Secondary | ICD-10-CM | POA: Insufficient documentation

## 2012-07-09 DIAGNOSIS — Z8719 Personal history of other diseases of the digestive system: Secondary | ICD-10-CM | POA: Insufficient documentation

## 2012-07-09 DIAGNOSIS — J45909 Unspecified asthma, uncomplicated: Secondary | ICD-10-CM | POA: Insufficient documentation

## 2012-07-09 DIAGNOSIS — S93409A Sprain of unspecified ligament of unspecified ankle, initial encounter: Secondary | ICD-10-CM | POA: Insufficient documentation

## 2012-07-09 DIAGNOSIS — Y929 Unspecified place or not applicable: Secondary | ICD-10-CM | POA: Insufficient documentation

## 2012-07-09 DIAGNOSIS — K219 Gastro-esophageal reflux disease without esophagitis: Secondary | ICD-10-CM | POA: Insufficient documentation

## 2012-07-09 DIAGNOSIS — Z880 Allergy status to penicillin: Secondary | ICD-10-CM | POA: Insufficient documentation

## 2012-07-09 DIAGNOSIS — F411 Generalized anxiety disorder: Secondary | ICD-10-CM | POA: Insufficient documentation

## 2012-07-09 DIAGNOSIS — N189 Chronic kidney disease, unspecified: Secondary | ICD-10-CM | POA: Insufficient documentation

## 2012-07-09 DIAGNOSIS — Z79899 Other long term (current) drug therapy: Secondary | ICD-10-CM | POA: Insufficient documentation

## 2012-07-09 DIAGNOSIS — Z8669 Personal history of other diseases of the nervous system and sense organs: Secondary | ICD-10-CM | POA: Insufficient documentation

## 2012-07-09 DIAGNOSIS — Z87448 Personal history of other diseases of urinary system: Secondary | ICD-10-CM | POA: Insufficient documentation

## 2012-07-09 DIAGNOSIS — F329 Major depressive disorder, single episode, unspecified: Secondary | ICD-10-CM | POA: Insufficient documentation

## 2012-07-09 DIAGNOSIS — F17200 Nicotine dependence, unspecified, uncomplicated: Secondary | ICD-10-CM | POA: Insufficient documentation

## 2012-07-09 DIAGNOSIS — Y939 Activity, unspecified: Secondary | ICD-10-CM | POA: Insufficient documentation

## 2012-07-09 MED ORDER — HYDROCODONE-ACETAMINOPHEN 5-325 MG PO TABS: 1.0000 | ORAL_TABLET | ORAL | Status: DC | PRN

## 2012-07-09 MED ORDER — IBUPROFEN 800 MG PO TABS: 800.0000 mg | ORAL_TABLET | Freq: Three times a day (TID) | ORAL | Status: DC

## 2012-07-09 MED ORDER — HYDROCODONE-ACETAMINOPHEN 5-325 MG PO TABS
1.0000 | ORAL_TABLET | Freq: Once | ORAL | Status: AC
  Administered 2012-07-09: 1 via ORAL
  Filled 2012-07-09: qty 1

## 2012-07-09 NOTE — ED Provider Notes (Signed)
History  This chart was scribed for non-physician practitioner working with Gavin Pound. Oletta Lamas, MD by Greggory Stallion, ED scribe. This patient was seen in room TR08C/TR08C and the patient's care was started at 6:27 PM.  CSN: [id number] Arrival date & time 07/09/12  1703   Chief Complaint  Patient presents with  . Ankle Pain    left ankle    The history is provided by the patient. No language interpreter was used.    HPI Comments: Michelle [name] is a 47 y.o. female with h/o diabetes mellitus who presents to the Emergency Department complaining of left ankle pain that started one week ago when she injured it getting out of someone's car. She states the pain is started to radiate up into her left leg and toes. Pt states she rolled her ankle again today while stepping onto an uneven surface. Pt states she heard a pop. She states it has started swelling. Pt states she checks her sugars normally for her diabetes.   Past Medical History  Diagnosis Date  . Asthma   . GERD (gastroesophageal reflux disease)   . Peptic ulcer disease 1984  . Episodic tension type headache   . Arthritis     hips, knees and spine  . Anxiety   . Depression   . Carpal tunnel syndrome of left wrist   . Barrett's esophagus   . Gastroparesis   . DM (diabetes mellitus)   . IBS (irritable bowel syndrome)   . Chronic kidney disease   . Narcotic abuse    Past Surgical History  Procedure Laterality Date  . Cervical fusion  2000    x2  . Joint replacement  2009    left knee x3  . Back surgery  2008    lumbar  fusion  . Cholecystectomy  1991  . Carpal tunnel release  2011    right hand  . Carpal tunnel release  11/23/2010    Procedure: CARPAL TUNNEL RELEASE;  Surgeon: Cammy Copa;  Location: [facility] OR;  Service: Orthopedics;  Laterality: Left;  revision left carpal tunnel release neurolysis of median nerve  . Esophagogastroduodenoscopy  03/20/2011    Procedure: ESOPHAGOGASTRODUODENOSCOPY (EGD);  Surgeon:  Hilarie Fredrickson, MD;  Location: [facility];  Service: Endoscopy;  Laterality: N/A;  [place] Dec /ja  . Knee arthroscopy      right  . Carpectomy  10/05/2011    Procedure: CARPECTOMY;  Surgeon: Tami Ribas, MD;  Location: [facility];  Service: Orthopedics;  Laterality: Left;  left proximal row carpectomy bone grafting capitate and radial styloidectomy   Family History  Problem Relation Age of Onset  . Esophageal cancer Father   . Clotting disorder Daughter   . Colon cancer Neg Hx   . Rectal cancer Neg Hx   . Stomach cancer Neg Hx    History  Substance Use Topics  . Smoking status: Current Every Day Smoker -- 0.50 packs/day for 30 years    Types: Cigarettes    Last Attempt to Quit: 04/28/2011  . Smokeless tobacco: Never Used  . Alcohol Use: No   OB History   Grav Para Term Preterm Abortions TAB SAB Ect Mult Living                 Review of Systems  Constitutional: Negative for activity change.  Musculoskeletal: Positive for arthralgias.  Skin: Negative for wound.  Neurological: Negative for numbness.  All other systems reviewed and are negative.    Allergies  Codeine and Penicillins  Home Medications   Current Outpatient Rx  Name  Route  Sig  Dispense  Refill  . clonazePAM (KLONOPIN) 1 MG tablet   Oral   Take 1 mg by mouth 3 (three) times daily as needed for anxiety.         Marland Kitchen desoximetasone (TOPICORT) 0.25 % cream   Topical   Apply 1 application topically 2 (two) times daily as needed (for psoriasis).         . DULoxetine (CYMBALTA) 30 MG capsule   Oral   Take 30-60 mg by mouth 2 (two) times daily. Takes 30mg  in morning and 60mg  at night, daily.         . furosemide (LASIX) 20 MG tablet   Oral   Take 20 mg by mouth daily.         Marland Kitchen gabapentin (NEURONTIN) 300 MG capsule   Oral   Take 600 mg by mouth 2 (two) times daily.          . Ipratropium-Albuterol (COMBIVENT RESPIMAT) 20-100 MCG/ACT AERS respimat   Inhalation   Inhale 1 puff into  the lungs every 6 (six) hours as needed for wheezing.         . lamoTRIgine (LAMICTAL) 25 MG tablet   Oral   Take 25 mg by mouth 2 (two) times daily.         Marland Kitchen omeprazole (PRILOSEC) 40 MG capsule   Oral   Take 40 mg by mouth daily.         . traZODone (DESYREL) 50 MG tablet   Oral   Take 50 mg by mouth at bedtime.          BP 117/81  Pulse 88  Temp(Src) 97.9 F (36.6 C) (Oral)  Resp 22  SpO2 96%  LMP 02/27/2011  Physical Exam  Nursing note and vitals reviewed. Constitutional: She is oriented to person, place, and time. She appears well-developed and well-nourished. No distress.  HENT:  Head: Normocephalic and atraumatic.  Eyes: EOM are normal.  Neck: Neck supple. No tracheal deviation present.  Cardiovascular: Normal rate, regular rhythm and intact distal pulses.   No murmur heard. Good cap refill.   Pulmonary/Chest: Effort normal and breath sounds normal. No respiratory distress.  Lungs clear bilaterally.   Musculoskeletal: Normal range of motion.  Generalized tenderness to top of left foot and left ankle with lateral swelling. No discoloration. Good sensation in lower extremities. No gross bony deformity. Ankle joint stable.  Neurological: She is alert and oriented to person, place, and time.  Skin: Skin is warm and dry.  Psychiatric: She has a normal mood and affect. Her behavior is normal.    ED Course  Procedures (including critical care time)  DIAGNOSTIC STUDIES: Oxygen Saturation is 96% on RA, normal by my interpretation.    COORDINATION OF CARE: 6:50 PM-Discussed treatment plan with pt at bedside and pt agreed to plan.   Labs Reviewed - No data to display No results found. No diagnosis found. 1. Left ankle sprain  MDM  Uncomplicated ankle sprain         I personally performed the services described in this documentation, which was scribed in my presence. The recorded information has been reviewed and is accurate.    Arnoldo Hooker,  PA-C 07/09/12 1937

## 2012-07-09 NOTE — ED Notes (Signed)
Patient states that she twisted her left ankle last week.  States that she twisted it again today. To ED for evaluation and pain management.  Left ankle is noted to be swollen.

## 2012-07-09 NOTE — ED Provider Notes (Signed)
Medical screening examination/treatment/procedure(s) were performed by non-physician practitioner and as supervising physician I was immediately available for consultation/collaboration.   Inessa Wardrop Y. Meriah Shands, MD 07/09/12 1947 

## 2012-07-09 NOTE — ED Notes (Signed)
Pt reports injured left ankle 1 week ago stepping out of a truck. Today pt was in the house and a piece of the floor gave way. Pt reports stepped down on left foot and ankle rolled.

## 2012-07-09 NOTE — ED Notes (Signed)
Ortho paged. 

## 2012-07-09 NOTE — Progress Notes (Signed)
Orthopedic Tech Progress Note Patient Details:  Michelle [name] 06-Jun-1965 [id number]  Ortho Devices Type of Ortho Device: ASO;Crutches Ortho Device/Splint Location: LLE Ortho Device/Splint Interventions: Ordered;Application   Jennye Moccasin 07/09/2012, 7:56 PM

## 2012-09-22 ENCOUNTER — Emergency Department (HOSPITAL_COMMUNITY): Payer: Medicaid Other

## 2012-09-22 ENCOUNTER — Emergency Department (HOSPITAL_COMMUNITY)
Admission: EM | Admit: 2012-09-22 | Discharge: 2012-09-22 | Disposition: A | Discharge status: Home / Self Care | Payer: Medicaid Other | Attending: Emergency Medicine | Admitting: Emergency Medicine

## 2012-09-22 ENCOUNTER — Encounter (HOSPITAL_COMMUNITY): Payer: Self-pay | Admitting: Cardiology

## 2012-09-22 DIAGNOSIS — Z880 Allergy status to penicillin: Secondary | ICD-10-CM | POA: Insufficient documentation

## 2012-09-22 DIAGNOSIS — E119 Type 2 diabetes mellitus without complications: Secondary | ICD-10-CM | POA: Insufficient documentation

## 2012-09-22 DIAGNOSIS — Z8711 Personal history of peptic ulcer disease: Secondary | ICD-10-CM | POA: Insufficient documentation

## 2012-09-22 DIAGNOSIS — Z79899 Other long term (current) drug therapy: Secondary | ICD-10-CM | POA: Insufficient documentation

## 2012-09-22 DIAGNOSIS — M1710 Unilateral primary osteoarthritis, unspecified knee: Secondary | ICD-10-CM | POA: Insufficient documentation

## 2012-09-22 DIAGNOSIS — M1711 Unilateral primary osteoarthritis, right knee: Secondary | ICD-10-CM

## 2012-09-22 DIAGNOSIS — F411 Generalized anxiety disorder: Secondary | ICD-10-CM | POA: Insufficient documentation

## 2012-09-22 DIAGNOSIS — F3289 Other specified depressive episodes: Secondary | ICD-10-CM | POA: Insufficient documentation

## 2012-09-22 DIAGNOSIS — F329 Major depressive disorder, single episode, unspecified: Secondary | ICD-10-CM | POA: Insufficient documentation

## 2012-09-22 DIAGNOSIS — K2270 Barrett's esophagus without dysplasia: Secondary | ICD-10-CM | POA: Insufficient documentation

## 2012-09-22 DIAGNOSIS — K219 Gastro-esophageal reflux disease without esophagitis: Secondary | ICD-10-CM | POA: Insufficient documentation

## 2012-09-22 DIAGNOSIS — N189 Chronic kidney disease, unspecified: Secondary | ICD-10-CM | POA: Insufficient documentation

## 2012-09-22 DIAGNOSIS — F17200 Nicotine dependence, unspecified, uncomplicated: Secondary | ICD-10-CM | POA: Insufficient documentation

## 2012-09-22 DIAGNOSIS — J45909 Unspecified asthma, uncomplicated: Secondary | ICD-10-CM | POA: Insufficient documentation

## 2012-09-22 DIAGNOSIS — Z8669 Personal history of other diseases of the nervous system and sense organs: Secondary | ICD-10-CM | POA: Insufficient documentation

## 2012-09-22 MED ORDER — NAPROXEN 500 MG PO TABS: 500.0000 mg | ORAL_TABLET | Freq: Two times a day (BID) | ORAL | Status: DC

## 2012-09-22 MED ORDER — HYDROCODONE-ACETAMINOPHEN 5-325 MG PO TABS: 1.0000 | ORAL_TABLET | Freq: Four times a day (QID) | ORAL | Status: DC | PRN

## 2012-09-22 MED ORDER — HYDROMORPHONE HCL PF 1 MG/ML IJ SOLN
0.5000 mg | Freq: Once | INTRAMUSCULAR | Status: AC
  Administered 2012-09-22: 0.5 mg via INTRAMUSCULAR
  Filled 2012-09-22: qty 1

## 2012-09-22 MED ORDER — OXYCODONE-ACETAMINOPHEN 5-325 MG PO TABS: 1.0000 | ORAL_TABLET | Freq: Four times a day (QID) | ORAL | Status: DC | PRN

## 2012-09-22 MED ORDER — OXYCODONE-ACETAMINOPHEN 5-325 MG PO TABS
2.0000 | ORAL_TABLET | Freq: Once | ORAL | Status: AC
  Administered 2012-09-22: 2 via ORAL
  Filled 2012-09-22: qty 2

## 2012-09-22 NOTE — ED Notes (Signed)
Pt reports right knee pain over the past couple of days. States she has a hx of gout and had to have fluid removed from the knee before. States it has just been hurting over the past couple of days and she feels a pop when she moved her knee.

## 2012-09-22 NOTE — ED Provider Notes (Signed)
CSN: [id number]     Arrival date & time 09/22/12  1616 History   This chart was scribed for non-physician practitioner, Felicie Morn, NP, working with Dagmar Hait, MD by Shari Heritage, ED Scribe. This patient was seen in room TR10C/TR10C and the patient's care was started at 4:59 PM.    Chief Complaint  Patient presents with  . Knee Pain    Patient is a 47 y.o. female presenting with knee pain. The history is provided by the patient. No language interpreter was used.  Knee Pain Location:  Knee Injury: no   Knee location:  R knee Pain details:    Radiates to:  Does not radiate   Pain severity now: moderate to severe.   Duration: onset 2-3 days ago.   Progression:  Unchanged Worsened by:  Bearing weight Associated symptoms: no fever and no swelling      HPI Comments: Michelle [name] is a 47 y.o. female with history of arthrocentesis who presents to the Emergency Department complaining of moderate to severe, constant right knee pain onset 2-3 days ago. She reports that pain is worse with movement and ambulation. She says that her knee "pops" when she moves it. There is no swelling. She states that she has a history of gout and is concerned that she is having a flare up. She denies any other pain or injuries at this time.  She denies abdominal pain, chest pain, nausea, fever or rash. She has a medical history of arthritis, asthma, anxiety, depression, DM, and kidney disease. She takes Lasix, Cymbalta, Neurontin, Lamictal, and trazodone daily. She is allergic to penicillin and codeine.    Past Medical History  Diagnosis Date  . Asthma   . GERD (gastroesophageal reflux disease)   . Peptic ulcer disease 1984  . Episodic tension type headache   . Arthritis     hips, knees and spine  . Anxiety   . Depression   . Carpal tunnel syndrome of left wrist   . Barrett's esophagus   . Gastroparesis   . DM (diabetes mellitus)   . IBS (irritable bowel syndrome)   . Chronic kidney  disease   . Narcotic abuse    Past Surgical History  Procedure Laterality Date  . Cervical fusion  2000    x2  . Joint replacement  2009    left knee x3  . Back surgery  2008    lumbar  fusion  . Cholecystectomy  1991  . Carpal tunnel release  2011    right hand  . Carpal tunnel release  11/23/2010    Procedure: CARPAL TUNNEL RELEASE;  Surgeon: Cammy Copa;  Location: [facility] OR;  Service: Orthopedics;  Laterality: Left;  revision left carpal tunnel release neurolysis of median nerve  . Esophagogastroduodenoscopy  03/20/2011    Procedure: ESOPHAGOGASTRODUODENOSCOPY (EGD);  Surgeon: Hilarie Fredrickson, MD;  [facility]: [facility];  Service: Endoscopy;  Laterality: N/A;  [place] Dec /ja  . Knee arthroscopy      right  . Carpectomy  10/05/2011    Procedure: CARPECTOMY;  Surgeon: Tami Ribas, MD;  Location: [facility];  Service: Orthopedics;  Laterality: Left;  left proximal row carpectomy bone grafting capitate and radial styloidectomy   Family History  Problem Relation Age of Onset  . Esophageal cancer Father   . Clotting disorder Daughter   . Colon cancer Neg Hx   . Rectal cancer Neg Hx   . Stomach cancer Neg Hx    History  Substance Use Topics  . Smoking status: Current Every Day Smoker -- 0.50 packs/day for 30 years    Types: Cigarettes    Last Attempt to Quit: 04/28/2011  . Smokeless tobacco: Never Used  . Alcohol Use: No   OB History   Grav Para Term Preterm Abortions TAB SAB Ect Mult Living                 Review of Systems  Constitutional: Negative for fever.  Gastrointestinal: Negative for nausea and abdominal pain.  Musculoskeletal: Positive for arthralgias.  Skin: Negative for rash.  All other systems reviewed and are negative.    Allergies  Codeine and Penicillins  Home Medications   Current Outpatient Rx  Name  Route  Sig  Dispense  Refill  . desoximetasone (TOPICORT) 0.25 % cream   Topical   Apply 1 application topically 2 (two) times daily.           . DULoxetine (CYMBALTA) 30 MG capsule   Oral   Take 30 mg by mouth 2 (two) times daily.          . furosemide (LASIX) 20 MG tablet   Oral   Take 20 mg by mouth daily.         Marland Kitchen gabapentin (NEURONTIN) 300 MG capsule   Oral   Take 600 mg by mouth 2 (two) times daily.          . Ipratropium-Albuterol (COMBIVENT RESPIMAT) 20-100 MCG/ACT AERS respimat   Inhalation   Inhale 1 puff into the lungs every 6 (six) hours as needed for wheezing.         . lamoTRIgine (LAMICTAL) 25 MG tablet   Oral   Take 25 mg by mouth 2 (two) times daily.         Marland Kitchen omeprazole (PRILOSEC) 40 MG capsule   Oral   Take 40 mg by mouth daily.         Marland Kitchen OVER THE COUNTER MEDICATION   Oral   Take 1 tablet by mouth every other day. Lipozene Dietary Supplement         . OVER THE COUNTER MEDICATION   Oral   Take 1 capsule by mouth every other day. Garcinea Cambogia Dietary Supplement         . traZODone (DESYREL) 50 MG tablet   Oral   Take 100 mg by mouth at bedtime.           Triage Vitals: BP 124/79  Temp(Src) 98 F (36.7 C) (Oral)  Resp 18  SpO2 96%  LMP 02/27/2011 Physical Exam  Nursing note and vitals reviewed. Constitutional: She is oriented to person, place, and time. She appears well-developed and well-nourished. No distress.  HENT:  Head: Normocephalic and atraumatic.  Eyes: EOM are normal.  Neck: Neck supple. No tracheal deviation present.  Cardiovascular: Normal rate and intact distal pulses.   Pulmonary/Chest: Effort normal. No respiratory distress.  Musculoskeletal: Normal range of motion.  Right knee pain. No injury noted. Mild warmth to the touch laterally. Sensation intact.  Neurological: She is alert and oriented to person, place, and time.  Skin: Skin is warm and dry.  Psychiatric: She has a normal mood and affect. Her behavior is normal.    ED Course  Procedures (including critical care time) DIAGNOSTIC STUDIES: Oxygen Saturation is 96% on room  air, adequate by my interpretation.    COORDINATION OF CARE: 5:04 PM- Patient informed of current plan for treatment and evaluation and agrees with plan  at this time.     Labs Review Labs Reviewed - No data to display  Imaging Review Dg Knee Complete 4 Views Right  09/22/2012   CLINICAL DATA:  Intermittent right knee pain  EXAM: RIGHT KNEE - COMPLETE 4+ VIEW  COMPARISON:  06/17/2010  FINDINGS: Four views of the right knee submitted. Again noted extensive osteoarthritic changes with progression from prior exam. Diffuse narrowing of joint space most significant lateral compartment. There is progression spurring of lateral femoral condyle and lateral tibial plateau. Sclerotic changes again noted lateral femoral condyle. There is mild sclerosis of the lateral tibial plateau. There is also worsening spurring of medial femoral condyle and medial tibial plateau. No joint effusion. Spurring of patella. Significant narrowing of patellofemoral joint space. No acute fracture or subluxation.  IMPRESSION: No acute fracture or subluxation. Worsening osteoarthritic changes as described above.   Electronically Signed   By: Natasha Mead   On: 09/22/2012 18:47   Radiology results reviewed and shared with patient.  Worsening osteoarthritis without acute changes or effusion.  Will treat with anti-inflammatory and percocet for breakthrough pain.  Patient has crutches for use at home. Referral to local orthopedist, however patient is in process of relocating to [facility], and will be seeking providers in that area.   MDM  Knee pain.  I personally performed the services described in this documentation, which was scribed in my presence. The recorded information has been reviewed and is accurate.    Jimmye Norman, NP 09/23/12 0030

## 2012-09-23 NOTE — ED Provider Notes (Signed)
Medical screening examination/treatment/procedure(s) were performed by non-physician practitioner and as supervising physician I was immediately available for consultation/collaboration.   Dagmar Hait, MD 09/23/12 [phone]

## 2012-10-18 ENCOUNTER — Encounter (HOSPITAL_COMMUNITY): Payer: Self-pay | Admitting: Emergency Medicine

## 2012-10-18 ENCOUNTER — Emergency Department (HOSPITAL_COMMUNITY)
Admission: EM | Admit: 2012-10-18 | Discharge: 2012-10-18 | Disposition: A | Discharge status: Home / Self Care | Payer: Medicaid Other | Attending: Emergency Medicine | Admitting: Emergency Medicine

## 2012-10-18 ENCOUNTER — Emergency Department (HOSPITAL_COMMUNITY): Payer: Medicaid Other

## 2012-10-18 DIAGNOSIS — F3289 Other specified depressive episodes: Secondary | ICD-10-CM | POA: Insufficient documentation

## 2012-10-18 DIAGNOSIS — J45909 Unspecified asthma, uncomplicated: Secondary | ICD-10-CM | POA: Insufficient documentation

## 2012-10-18 DIAGNOSIS — K2270 Barrett's esophagus without dysplasia: Secondary | ICD-10-CM | POA: Insufficient documentation

## 2012-10-18 DIAGNOSIS — IMO0002 Reserved for concepts with insufficient information to code with codable children: Secondary | ICD-10-CM | POA: Insufficient documentation

## 2012-10-18 DIAGNOSIS — F411 Generalized anxiety disorder: Secondary | ICD-10-CM | POA: Insufficient documentation

## 2012-10-18 DIAGNOSIS — Z3202 Encounter for pregnancy test, result negative: Secondary | ICD-10-CM | POA: Insufficient documentation

## 2012-10-18 DIAGNOSIS — Z79899 Other long term (current) drug therapy: Secondary | ICD-10-CM | POA: Insufficient documentation

## 2012-10-18 DIAGNOSIS — K219 Gastro-esophageal reflux disease without esophagitis: Secondary | ICD-10-CM | POA: Insufficient documentation

## 2012-10-18 DIAGNOSIS — N390 Urinary tract infection, site not specified: Secondary | ICD-10-CM | POA: Insufficient documentation

## 2012-10-18 DIAGNOSIS — N189 Chronic kidney disease, unspecified: Secondary | ICD-10-CM | POA: Insufficient documentation

## 2012-10-18 DIAGNOSIS — F17200 Nicotine dependence, unspecified, uncomplicated: Secondary | ICD-10-CM | POA: Insufficient documentation

## 2012-10-18 DIAGNOSIS — Z8711 Personal history of peptic ulcer disease: Secondary | ICD-10-CM | POA: Insufficient documentation

## 2012-10-18 DIAGNOSIS — E119 Type 2 diabetes mellitus without complications: Secondary | ICD-10-CM | POA: Insufficient documentation

## 2012-10-18 DIAGNOSIS — Z8739 Personal history of other diseases of the musculoskeletal system and connective tissue: Secondary | ICD-10-CM | POA: Insufficient documentation

## 2012-10-18 DIAGNOSIS — F329 Major depressive disorder, single episode, unspecified: Secondary | ICD-10-CM | POA: Insufficient documentation

## 2012-10-18 DIAGNOSIS — Z880 Allergy status to penicillin: Secondary | ICD-10-CM | POA: Insufficient documentation

## 2012-10-18 LAB — URINE MICROSCOPIC-ADD ON

## 2012-10-18 LAB — BASIC METABOLIC PANEL
BUN: 10 mg/dL (ref 6–23)
CO2: 33 mEq/L — ABNORMAL HIGH (ref 19–32)
Calcium: 8.9 mg/dL (ref 8.4–10.5)
Chloride: 97 mEq/L (ref 96–112)
Creatinine, Ser: 1.01 mg/dL (ref 0.50–1.10)
GFR calc Af Amer: 76 mL/min — ABNORMAL LOW (ref >90)
GFR calc non Af Amer: 65 mL/min — ABNORMAL LOW (ref >90)
Glucose, Bld: 112 mg/dL — ABNORMAL HIGH (ref 70–99)
Potassium: 3.1 mEq/L — ABNORMAL LOW (ref 3.5–5.1)
Sodium: 140 mEq/L (ref 135–145)

## 2012-10-18 LAB — POCT I-STAT, CHEM 8
BUN: 9 mg/dL (ref 6–23)
Calcium, Ion: 1.06 mmol/L — ABNORMAL LOW (ref 1.12–1.23)
Chloride: 94 mEq/L — ABNORMAL LOW (ref 96–112)
Creatinine, Ser: 1.3 mg/dL — ABNORMAL HIGH (ref 0.50–1.10)
Glucose, Bld: 114 mg/dL — ABNORMAL HIGH (ref 70–99)
HCT: 45 % (ref 36.0–46.0)
Hemoglobin: 15.3 g/dL — ABNORMAL HIGH (ref 12.0–15.0)
Potassium: 3.1 mEq/L — ABNORMAL LOW (ref 3.5–5.1)
Sodium: 139 mEq/L (ref 135–145)
TCO2: 31 mmol/L (ref 0–100)

## 2012-10-18 LAB — CBC WITH DIFFERENTIAL/PLATELET
Basophils Absolute: 0 10*3/uL (ref 0.0–0.1)
Basophils Relative: 0 % (ref 0–1)
Eosinophils Absolute: 0.2 10*3/uL (ref 0.0–0.7)
Eosinophils Relative: 3 % (ref 0–5)
HCT: 40.5 % (ref 36.0–46.0)
Hemoglobin: 14 g/dL (ref 12.0–15.0)
Lymphocytes Relative: 42 % (ref 12–46)
Lymphs Abs: 2.3 10*3/uL (ref 0.7–4.0)
MCH: 31.3 pg (ref 26.0–34.0)
MCHC: 34.6 g/dL (ref 30.0–36.0)
MCV: 90.4 fL (ref 78.0–100.0)
Monocytes Absolute: 0.5 10*3/uL (ref 0.1–1.0)
Monocytes Relative: 9 % (ref 3–12)
Neutro Abs: 2.5 10*3/uL (ref 1.7–7.7)
Neutrophils Relative %: 46 % (ref 43–77)
Platelets: 123 10*3/uL — ABNORMAL LOW (ref 150–400)
RBC: 4.48 MIL/uL (ref 3.87–5.11)
RDW: 15.9 % — ABNORMAL HIGH (ref 11.5–15.5)
WBC: 5.5 10*3/uL (ref 4.0–10.5)

## 2012-10-18 LAB — URINALYSIS, ROUTINE W REFLEX MICROSCOPIC
Glucose, UA: NEGATIVE mg/dL
Ketones, ur: 15 mg/dL — AB
Nitrite: POSITIVE — AB
Protein, ur: >300 mg/dL — AB
Specific Gravity, Urine: 1.025 (ref 1.005–1.030)
Urobilinogen, UA: 2 mg/dL — ABNORMAL HIGH (ref 0.0–1.0)
pH: 7 (ref 5.0–8.0)

## 2012-10-18 LAB — CG4 I-STAT (LACTIC ACID): Lactic Acid, Venous: 1.07 mmol/L (ref 0.5–2.2)

## 2012-10-18 LAB — POCT PREGNANCY, URINE: Preg Test, Ur: NEGATIVE

## 2012-10-18 MED ORDER — ONDANSETRON 8 MG PO TBDP: ORAL_TABLET | ORAL | Status: DC

## 2012-10-18 MED ORDER — DEXTROSE 5 % IV SOLN
1.0000 g | Freq: Once | INTRAVENOUS | Status: AC
  Administered 2012-10-18: 1 g via INTRAVENOUS
  Filled 2012-10-18: qty 10

## 2012-10-18 MED ORDER — HYDROMORPHONE HCL PF 1 MG/ML IJ SOLN
1.0000 mg | Freq: Once | INTRAMUSCULAR | Status: AC
  Administered 2012-10-18: 1 mg via INTRAVENOUS
  Filled 2012-10-18: qty 1

## 2012-10-18 MED ORDER — ONDANSETRON HCL 4 MG/2ML IJ SOLN
4.0000 mg | Freq: Once | INTRAMUSCULAR | Status: AC
  Administered 2012-10-18: 4 mg via INTRAVENOUS
  Filled 2012-10-18: qty 2

## 2012-10-18 MED ORDER — SODIUM CHLORIDE 0.9 % IV BOLUS (SEPSIS)
1000.0000 mL | Freq: Once | INTRAVENOUS | Status: AC
  Administered 2012-10-18: 1000 mL via INTRAVENOUS

## 2012-10-18 MED ORDER — CEFIXIME 100 MG/5ML PO SUSR: 100.0000 mg | Freq: Two times a day (BID) | ORAL | Status: DC

## 2012-10-18 MED ORDER — CEFIXIME 400 MG PO TABS: 400.0000 mg | ORAL_TABLET | Freq: Every day | ORAL | Status: DC

## 2012-10-18 MED ORDER — OXYCODONE-ACETAMINOPHEN 5-325 MG PO TABS: 1.0000 | ORAL_TABLET | ORAL | Status: DC | PRN

## 2012-10-18 MED ORDER — OXYCODONE-ACETAMINOPHEN 5-325 MG PO TABS
1.0000 | ORAL_TABLET | Freq: Once | ORAL | Status: AC
  Administered 2012-10-18: 1 via ORAL
  Filled 2012-10-18: qty 1

## 2012-10-18 MED ORDER — PROMETHAZINE HCL 25 MG PO TABS: 25.0000 mg | ORAL_TABLET | Freq: Four times a day (QID) | ORAL | Status: DC | PRN

## 2012-10-18 NOTE — ED Provider Notes (Signed)
CSN: [id number]     Arrival date & time 10/18/12  1623 History   First MD Initiated Contact with Patient 10/18/12 1858     Chief Complaint  Patient presents with  . Abdominal Pain   (Consider location/radiation/quality/duration/timing/severity/associated sxs/prior Treatment) HPI Comments: 47 yo female with 2-3 days of back pain, dysuria and abdominal pain. Denies any hematuria or vomiting. Has taken AZO without change in condition. Has not taken any other meds. Pain is currently a 10/10. Has hx of CKD and feels like this is the worst pyelo she's had, she's worried her Cr is elevated. Denies diarrhea or constipation.  The history is provided by the patient.    Past Medical History  Diagnosis Date  . Asthma   . GERD (gastroesophageal reflux disease)   . Peptic ulcer disease 1984  . Episodic tension type headache   . Arthritis     hips, knees and spine  . Anxiety   . Depression   . Carpal tunnel syndrome of left wrist   . Barrett's esophagus   . Gastroparesis   . DM (diabetes mellitus)   . IBS (irritable bowel syndrome)   . Chronic kidney disease   . Narcotic abuse    Past Surgical History  Procedure Laterality Date  . Cervical fusion  2000    x2  . Joint replacement  2009    left knee x3  . Back surgery  2008    lumbar  fusion  . Cholecystectomy  1991  . Carpal tunnel release  2011    right hand  . Carpal tunnel release  11/23/2010    Procedure: CARPAL TUNNEL RELEASE;  Surgeon: Cammy Copa;  Location: [facility] OR;  Service: Orthopedics;  Laterality: Left;  revision left carpal tunnel release neurolysis of median nerve  . Esophagogastroduodenoscopy  03/20/2011    Procedure: ESOPHAGOGASTRODUODENOSCOPY (EGD);  Surgeon: Hilarie Fredrickson, MD;  Location: [facility] ENDOSCOPY;  Service: Endoscopy;  Laterality: N/A;  [place] Dec /ja  . Knee arthroscopy      right  . Carpectomy  10/05/2011    Procedure: CARPECTOMY;  Surgeon: Tami Ribas, MD;  Location: [facility];  Service:  Orthopedics;  Laterality: Left;  left proximal row carpectomy bone grafting capitate and radial styloidectomy   Family History  Problem Relation Age of Onset  . Esophageal cancer Father   . Clotting disorder Daughter   . Colon cancer Neg Hx   . Rectal cancer Neg Hx   . Stomach cancer Neg Hx    History  Substance Use Topics  . Smoking status: Current Every Day Smoker -- 0.50 packs/day for 30 years    Types: Cigarettes    Last Attempt to Quit: 04/28/2011  . Smokeless tobacco: Never Used  . Alcohol Use: No   OB History   Grav Para Term Preterm Abortions TAB SAB Ect Mult Living                 Review of Systems  Genitourinary: Negative for menstrual problem.  Musculoskeletal: Positive for back pain.  Neurological: Negative for weakness and numbness.  All other systems reviewed and are negative.    Allergies  Codeine and Penicillins  Home Medications   Current Outpatient Rx  Name  Route  Sig  Dispense  Refill  . desoximetasone (TOPICORT) 0.25 % cream   Topical   Apply 1 application topically 2 (two) times daily.          . DULoxetine (CYMBALTA) 30 MG capsule  Oral   Take 30 mg by mouth 2 (two) times daily.          . furosemide (LASIX) 20 MG tablet   Oral   Take 20 mg by mouth daily.         Marland Kitchen gabapentin (NEURONTIN) 300 MG capsule   Oral   Take 600 mg by mouth 2 (two) times daily.          . Ipratropium-Albuterol (COMBIVENT RESPIMAT) 20-100 MCG/ACT AERS respimat   Inhalation   Inhale 1 puff into the lungs every 6 (six) hours as needed for wheezing.         . lamoTRIgine (LAMICTAL) 25 MG tablet   Oral   Take 25 mg by mouth 2 (two) times daily.         Marland Kitchen omeprazole (PRILOSEC) 40 MG capsule   Oral   Take 40 mg by mouth daily.         . traZODone (DESYREL) 50 MG tablet   Oral   Take 100 mg by mouth at bedtime.          . cefixime (SUPRAX) 400 MG tablet   Oral   Take 1 tablet (400 mg total) by mouth daily.   10 tablet   0   .  ondansetron (ZOFRAN ODT) 8 MG disintegrating tablet      8mg  ODT q4 hours prn nausea   20 tablet   0   . oxyCODONE-acetaminophen (PERCOCET) 5-325 MG per tablet   Oral   Take 1-2 tablets by mouth every 4 (four) hours as needed for pain.   20 tablet   0   . promethazine (PHENERGAN) 25 MG tablet   Oral   Take 1 tablet (25 mg total) by mouth every 6 (six) hours as needed for nausea.   20 tablet   0    BP 101/61  Pulse 76  Temp(Src) 98 F (36.7 C) (Oral)  Resp 18  Wt 296 lb (134.265 kg)  BMI 44.35 kg/m2  SpO2 96%  LMP 02/27/2011 Physical Exam  Vitals reviewed. Constitutional: She is oriented to person, place, and time. She appears well-developed and well-nourished. No distress.  HENT:  Head: Normocephalic and atraumatic.  Right Ear: External ear normal.  Left Ear: External ear normal.  Nose: Nose normal.  Eyes: Right eye exhibits no discharge. Left eye exhibits no discharge.  Cardiovascular: Normal rate, regular rhythm and normal heart sounds.   Pulmonary/Chest: Effort normal and breath sounds normal.  Abdominal: Soft. There is tenderness. There is CVA tenderness.  Neurological: She is alert and oriented to person, place, and time.  Skin: Skin is warm and dry.    ED Course  Procedures (including critical care time) Labs Review Labs Reviewed  URINALYSIS, ROUTINE W REFLEX MICROSCOPIC - Abnormal; Notable for the following:    Color, Urine AMBER (*)    APPearance TURBID (*)    Hgb urine dipstick MODERATE (*)    Bilirubin Urine SMALL (*)    Ketones, ur 15 (*)    Protein, ur >300 (*)    Urobilinogen, UA 2.0 (*)    Nitrite POSITIVE (*)    Leukocytes, UA LARGE (*)    All other components within normal limits  URINE MICROSCOPIC-ADD ON - Abnormal; Notable for the following:    Squamous Epithelial / LPF FEW (*)    Bacteria, UA MANY (*)    All other components within normal limits  CBC WITH DIFFERENTIAL - Abnormal; Notable for the following:  RDW 15.9 (*)     Platelets 123 (*)    All other components within normal limits  BASIC METABOLIC PANEL - Abnormal; Notable for the following:    Potassium 3.1 (*)    CO2 33 (*)    Glucose, Bld 112 (*)    GFR calc non Af Amer 65 (*)    GFR calc Af Amer 76 (*)    All other components within normal limits  POCT I-STAT, CHEM 8 - Abnormal; Notable for the following:    Potassium 3.1 (*)    Chloride 94 (*)    Creatinine, Ser 1.30 (*)    Glucose, Bld 114 (*)    Calcium, Ion 1.06 (*)    Hemoglobin 15.3 (*)    All other components within normal limits  URINE CULTURE  POCT PREGNANCY, URINE  CG4 I-STAT (LACTIC ACID)   Imaging Review   MDM   1. UTI (lower urinary tract infection)    Has mild abd tenderness, given her sx of pyelo will get CT to r/o more extensive process. Given rocephin IV for UTI, as well as pain control. Care transferred with CT pending. If benign and labs are ok, will d/c with outpatient abx and close PCP f/u.    Audree Camel, MD 10/18/12 2149

## 2012-10-18 NOTE — ED Provider Notes (Signed)
Michelle [name] S 8:30 PM patient discussed sign out with Dr. Criss Alvine.  Patient is a 47 year old female with history of chronic kidney disease presenting with complaints of lower abdominal and back pain. Symptoms are also associated with dysuria and some hematuria. Patient was taking over-the-counter AZO tablets without any significant changes. Her lab testing today shows significant signs of urinary tract infection and given symptoms there is some concern for pyelonephritis. CT scan was ordered for further evaluation and to rule out possible kidney stone.   IV fluids have been given for rehydration as well as pain and antiemetic medications. Initial dose of antibiotics with Rocephin started. Plan to recheck patient's condition and CT scan results. Expectation that if her symptoms are improving she will be able to return home with prescription for continued antibiotic treatments for early pyelonephritis and UTI.  CT scan unremarkable. No hydronephrosis. No signs of kidney stones. Small air in bladder possibly consistent with UTI. Patient did have improvements with pain medications. I have agreed to give one additional dose of pain medicine and will discharge pt at this time with prescriptions for continued treatment. Patient expressed her understanding and agrees with plan.     Angus Seller, PA-C 10/18/12 2144

## 2012-10-18 NOTE — ED Notes (Signed)
Presents with bilateral lower abdominal pain with radiation to back. Reports pain with urination, denies hematuria. Has been taking AZO tablets. Reports going more frequently than normal. Denies vaginal discharge and bleeding. Pain began a few days ago.

## 2012-10-19 NOTE — ED Provider Notes (Signed)
Medical screening examination/treatment/procedure(s) were performed by non-physician practitioner and as supervising physician I was immediately available for consultation/collaboration.   Gwyneth Sprout, MD 10/19/12 1346

## 2012-10-20 LAB — URINE CULTURE: Colony Count: >=100000

## 2012-10-21 ENCOUNTER — Telehealth (HOSPITAL_COMMUNITY): Payer: Self-pay | Admitting: Emergency Medicine

## 2012-10-21 NOTE — ED Notes (Signed)
Post ED Visit - Positive Culture Follow-up  Culture report reviewed by antimicrobial stewardship pharmacist: []  Wes Dulaney, Pharm.D., BCPS [x]  Celedonio Miyamoto, Pharm.D., BCPS []  Georgina Pillion, Pharm.D., BCPS []  [place], [address].D., BCPS, AAHIVP []  Estella Husk, Pharm.D., BCPS, AAHIVP  Positive urine culture Treated with Cefixime, organism sensitive to the same and no further patient follow-up is required at this time.  Kylie A Holland 10/21/2012, 3:10 PM

## 2013-01-18 ENCOUNTER — Emergency Department (HOSPITAL_COMMUNITY): Payer: Medicaid Other

## 2013-01-18 ENCOUNTER — Encounter (HOSPITAL_COMMUNITY): Payer: Self-pay | Admitting: Emergency Medicine

## 2013-01-18 ENCOUNTER — Emergency Department (HOSPITAL_COMMUNITY)
Admission: EM | Admit: 2013-01-18 | Discharge: 2013-01-18 | Disposition: A | Discharge status: Home / Self Care | Payer: Medicaid Other | Attending: Emergency Medicine | Admitting: Emergency Medicine

## 2013-01-18 DIAGNOSIS — Z79899 Other long term (current) drug therapy: Secondary | ICD-10-CM | POA: Insufficient documentation

## 2013-01-18 DIAGNOSIS — K219 Gastro-esophageal reflux disease without esophagitis: Secondary | ICD-10-CM | POA: Insufficient documentation

## 2013-01-18 DIAGNOSIS — IMO0002 Reserved for concepts with insufficient information to code with codable children: Secondary | ICD-10-CM | POA: Insufficient documentation

## 2013-01-18 DIAGNOSIS — Z880 Allergy status to penicillin: Secondary | ICD-10-CM | POA: Insufficient documentation

## 2013-01-18 DIAGNOSIS — E119 Type 2 diabetes mellitus without complications: Secondary | ICD-10-CM | POA: Insufficient documentation

## 2013-01-18 DIAGNOSIS — F3289 Other specified depressive episodes: Secondary | ICD-10-CM | POA: Insufficient documentation

## 2013-01-18 DIAGNOSIS — F17200 Nicotine dependence, unspecified, uncomplicated: Secondary | ICD-10-CM | POA: Insufficient documentation

## 2013-01-18 DIAGNOSIS — J3489 Other specified disorders of nose and nasal sinuses: Secondary | ICD-10-CM | POA: Insufficient documentation

## 2013-01-18 DIAGNOSIS — J40 Bronchitis, not specified as acute or chronic: Secondary | ICD-10-CM

## 2013-01-18 DIAGNOSIS — R51 Headache: Secondary | ICD-10-CM | POA: Insufficient documentation

## 2013-01-18 DIAGNOSIS — K2270 Barrett's esophagus without dysplasia: Secondary | ICD-10-CM | POA: Insufficient documentation

## 2013-01-18 DIAGNOSIS — F329 Major depressive disorder, single episode, unspecified: Secondary | ICD-10-CM | POA: Insufficient documentation

## 2013-01-18 DIAGNOSIS — IMO0001 Reserved for inherently not codable concepts without codable children: Secondary | ICD-10-CM | POA: Insufficient documentation

## 2013-01-18 DIAGNOSIS — N189 Chronic kidney disease, unspecified: Secondary | ICD-10-CM | POA: Insufficient documentation

## 2013-01-18 DIAGNOSIS — J45901 Unspecified asthma with (acute) exacerbation: Secondary | ICD-10-CM | POA: Insufficient documentation

## 2013-01-18 DIAGNOSIS — K279 Peptic ulcer, site unspecified, unspecified as acute or chronic, without hemorrhage or perforation: Secondary | ICD-10-CM | POA: Insufficient documentation

## 2013-01-18 DIAGNOSIS — M129 Arthropathy, unspecified: Secondary | ICD-10-CM | POA: Insufficient documentation

## 2013-01-18 DIAGNOSIS — F411 Generalized anxiety disorder: Secondary | ICD-10-CM | POA: Insufficient documentation

## 2013-01-18 DIAGNOSIS — Z8711 Personal history of peptic ulcer disease: Secondary | ICD-10-CM | POA: Insufficient documentation

## 2013-01-18 LAB — CBC WITH DIFFERENTIAL/PLATELET
Basophils Absolute: 0 10*3/uL (ref 0.0–0.1)
Basophils Relative: 0 % (ref 0–1)
Eosinophils Absolute: 0.1 10*3/uL (ref 0.0–0.7)
Eosinophils Relative: 4 % (ref 0–5)
HCT: 43.4 % (ref 36.0–46.0)
Hemoglobin: 14.3 g/dL (ref 12.0–15.0)
Lymphocytes Relative: 38 % (ref 12–46)
Lymphs Abs: 1.1 10*3/uL (ref 0.7–4.0)
MCH: 30.8 pg (ref 26.0–34.0)
MCHC: 32.9 g/dL (ref 30.0–36.0)
MCV: 93.3 fL (ref 78.0–100.0)
Monocytes Absolute: 0.3 10*3/uL (ref 0.1–1.0)
Monocytes Relative: 10 % (ref 3–12)
Neutro Abs: 1.4 10*3/uL — ABNORMAL LOW (ref 1.7–7.7)
Neutrophils Relative %: 48 % (ref 43–77)
Platelets: 105 10*3/uL — ABNORMAL LOW (ref 150–400)
RBC: 4.65 MIL/uL (ref 3.87–5.11)
RDW: 15.3 % (ref 11.5–15.5)
Smear Review: DECREASED
WBC: 3 10*3/uL — ABNORMAL LOW (ref 4.0–10.5)

## 2013-01-18 LAB — BASIC METABOLIC PANEL
BUN: 7 mg/dL (ref 6–23)
CO2: 31 mEq/L (ref 19–32)
Calcium: 9.1 mg/dL (ref 8.4–10.5)
Chloride: 97 mEq/L (ref 96–112)
Creatinine, Ser: 1.15 mg/dL — ABNORMAL HIGH (ref 0.50–1.10)
GFR calc Af Amer: 65 mL/min — ABNORMAL LOW (ref >90)
GFR calc non Af Amer: 56 mL/min — ABNORMAL LOW (ref >90)
Glucose, Bld: 97 mg/dL (ref 70–99)
Potassium: 4 mEq/L (ref 3.7–5.3)
Sodium: 138 mEq/L (ref 137–147)

## 2013-01-18 MED ORDER — OSELTAMIVIR PHOSPHATE 75 MG PO CAPS: 75.0000 mg | ORAL_CAPSULE | Freq: Two times a day (BID) | ORAL | Status: DC

## 2013-01-18 MED ORDER — ALBUTEROL SULFATE HFA 108 (90 BASE) MCG/ACT IN AERS: 2.0000 | INHALATION_SPRAY | RESPIRATORY_TRACT | Status: DC | PRN

## 2013-01-18 MED ORDER — AZITHROMYCIN 200 MG/5ML PO SUSR: 200.0000 mg | Freq: Every day | ORAL | Status: DC

## 2013-01-18 MED ORDER — PREDNISONE 50 MG PO TABS: 50.0000 mg | ORAL_TABLET | Freq: Every day | ORAL | Status: DC

## 2013-01-18 NOTE — ED Notes (Signed)
Patient complains that she has not received anything for pain.  Explained to patient that the MD seeing her is with another patient at this time and I will request pain medication when he is finished with procedure.

## 2013-01-18 NOTE — ED Notes (Signed)
Cough, bil ear pain, diarrhea. headache

## 2013-01-18 NOTE — ED Notes (Signed)
Discharge instructions given and reviewed with patient.  Prescriptions given for Tamiflu, Prednisone, Zithromax and Albuterol MDI; effects and use explained for each.  Patient states she has inhaler at home and will use that one.  Patient ambulatory with steady gait; discharged home in good condition.

## 2013-01-18 NOTE — Discharge Instructions (Signed)
Bronchitis Bronchitis is the body's way of reacting to injury and/or infection (inflammation) of the bronchi. Bronchi are the air tubes that extend from the windpipe into the lungs. If the inflammation becomes severe, it may cause shortness of breath. CAUSES  Inflammation may be caused by:  A virus.  Germs (bacteria).  Dust.  Allergens.  Pollutants and many other irritants. The cells lining the bronchial tree are covered with tiny hairs (cilia). These constantly beat upward, away from the lungs, toward the mouth. This keeps the lungs free of pollutants. When these cells become too irritated and are unable to do their job, mucus begins to develop. This causes the characteristic cough of bronchitis. The cough clears the lungs when the cilia are unable to do their job. Without either of these protective mechanisms, the mucus would settle in the lungs. Then you would develop pneumonia. Smoking is a common cause of bronchitis and can contribute to pneumonia. Stopping this habit is the single most important thing you can do to help yourself. TREATMENT   Your caregiver may prescribe an antibiotic if the cough is caused by bacteria. Also, medicines that open up your airways make it easier to breathe. Your caregiver may also recommend or prescribe an expectorant. It will loosen the mucus to be coughed up. Only take over-the-counter or prescription medicines for pain, discomfort, or fever as directed by your caregiver.  Removing whatever causes the problem (smoking, for example) is critical to preventing the problem from getting worse.  Cough suppressants may be prescribed for relief of cough symptoms.  Inhaled medicines may be prescribed to help with symptoms now and to help prevent problems from returning.  For those with recurrent (chronic) bronchitis, there may be a need for steroid medicines. SEEK IMMEDIATE MEDICAL CARE IF:   During treatment, you develop more pus-like mucus (purulent  sputum).  You have a fever.  You become progressively more ill.  You have increased difficulty breathing, wheezing, or shortness of breath. It is necessary to seek immediate medical care if you are elderly or sick from any other disease. MAKE SURE YOU:   Understand these instructions.  Will watch your condition.  Will get help right away if you are not doing well or get worse. Document Released: 12/28/2004 Document Revised: 08/30/2012 Document Reviewed: 08/22/2012 [facility] Patient Information 2014 [place]. Influenza, Adult Influenza ("the flu") is a viral infection of the respiratory tract. It occurs more often in winter months because people spend more time in close contact with one another. Influenza can make you feel very sick. Influenza easily spreads from person to person (contagious). CAUSES  Influenza is caused by a virus that infects the respiratory tract. You can catch the virus by breathing in droplets from an infected person's cough or sneeze. You can also catch the virus by touching something that was recently contaminated with the virus and then touching your mouth, nose, or eyes. SYMPTOMS  Symptoms typically last 4 to 10 days and may include:  Fever.  Chills.  Headache, body aches, and muscle aches.  Sore throat.  Chest discomfort and cough.  Poor appetite.  Weakness or feeling tired.  Dizziness.  Nausea or vomiting. DIAGNOSIS  Diagnosis of influenza is often made based on your history and a physical exam. A nose or throat swab test can be done to confirm the diagnosis. RISKS AND COMPLICATIONS You may be at risk for a more severe case of influenza if you smoke cigarettes, have diabetes, have chronic heart disease (such as  heart failure) or lung disease (such as asthma), or if you have a weakened immune system. Elderly people and pregnant women are also at risk for more serious infections. The most common complication of influenza is a lung infection  (pneumonia). Sometimes, this complication can require emergency medical care and may be life-threatening. PREVENTION  An annual influenza vaccination (flu shot) is the best way to avoid getting influenza. An annual flu shot is now routinely recommended for all adults in the [place] TREATMENT  In mild cases, influenza goes away on its own. Treatment is directed at relieving symptoms. For more severe cases, your caregiver may prescribe antiviral medicines to shorten the sickness. Antibiotic medicines are not effective, because the infection is caused by a virus, not by bacteria. HOME CARE INSTRUCTIONS  Only take over-the-counter or prescription medicines for pain, discomfort, or fever as directed by your caregiver.  Use a cool mist humidifier to make breathing easier.  Get plenty of rest until your temperature returns to normal. This usually takes 3 to 4 days.  Drink enough fluids to keep your urine clear or pale yellow.  Cover your mouth and nose when coughing or sneezing, and wash your hands well to avoid spreading the virus.  Stay home from work or school until your fever has been gone for at least 1 full day. SEEK MEDICAL CARE IF:   You have chest pain or a deep cough that worsens or produces more mucus.  You have nausea, vomiting, or diarrhea. SEEK IMMEDIATE MEDICAL CARE IF:   You have difficulty breathing, shortness of breath, or your skin or nails turn bluish.  You have severe neck pain or stiffness.  You have a severe headache, facial pain, or earache.  You have a worsening or recurring fever.  You have nausea or vomiting that cannot be controlled. MAKE SURE YOU:  Understand these instructions.  Will watch your condition.  Will get help right away if you are not doing well or get worse. Document Released: 12/26/1999 Document Revised: 06/29/2011 Document Reviewed: 03/29/2011 [facility] Patient Information 2014 [place], [place].

## 2013-01-18 NOTE — ED Notes (Signed)
Patient is upset that she did not receive any pain medication for her ears.

## 2013-01-23 NOTE — ED Provider Notes (Signed)
CSN: [id number]     Arrival date & time 01/18/13  1536 History   First MD Initiated Contact with Patient 01/18/13 1730     Chief Complaint  Patient presents with  . Cough   (Consider location/radiation/quality/duration/timing/severity/associated sxs/prior Treatment) HPI Comments: Pt comes in with cc of cough, bilateral ear pain, nasal congestion, headaches, body aches.  Sx started less than 2 days ago. No fevers. Pt has hx of PUD, DM, CKD.   Patient is a 48 y.o. female presenting with cough. The history is provided by the patient.  Cough Associated symptoms: headaches and myalgias   Associated symptoms: no chest pain and no shortness of breath     Past Medical History  Diagnosis Date  . Asthma   . GERD (gastroesophageal reflux disease)   . Peptic ulcer disease 1984  . Episodic tension type headache   . Arthritis     hips, knees and spine  . Anxiety   . Depression   . Carpal tunnel syndrome of left wrist   . Barrett's esophagus   . Gastroparesis   . DM (diabetes mellitus)   . IBS (irritable bowel syndrome)   . Narcotic abuse   . Chronic kidney disease   . Bronchitis    Past Surgical History  Procedure Laterality Date  . Cervical fusion  2000    x2  . Joint replacement  2009    left knee x3  . Back surgery  2008    lumbar  fusion  . Cholecystectomy  1991  . Carpal tunnel release  2011    right hand  . Carpal tunnel release  11/23/2010    Procedure: CARPAL TUNNEL RELEASE;  Surgeon: Meredith Pel;  Location: [facility];  Service: Orthopedics;  Laterality: Left;  revision left carpal tunnel release neurolysis of median nerve  . Esophagogastroduodenoscopy  03/20/2011    Procedure: ESOPHAGOGASTRODUODENOSCOPY (EGD);  Surgeon: Irene Shipper, MD;  Location: [facility] ENDOSCOPY;  Service: Endoscopy;  Laterality: N/A;  Clarise Cruz /ja  . Knee arthroscopy      right  . Carpectomy  10/05/2011    Procedure: CARPECTOMY;  Surgeon: Tennis Must, MD;  Location: [facility];  Service:  Orthopedics;  Laterality: Left;  left proximal row carpectomy bone grafting capitate and radial styloidectomy  . Tubal ligation     Family History  Problem Relation Age of Onset  . Esophageal cancer Father   . Clotting disorder Daughter   . Colon cancer Neg Hx   . Rectal cancer Neg Hx   . Stomach cancer Neg Hx    History  Substance Use Topics  . Smoking status: Current Every Day Smoker -- 0.50 packs/day for 30 years    Types: Cigarettes    Last Attempt to Quit: 04/28/2011  . Smokeless tobacco: Never Used  . Alcohol Use: No   OB History   Grav Para Term Preterm Abortions TAB SAB Ect Mult Living                 Review of Systems  Constitutional: Positive for activity change.  Respiratory: Positive for cough. Negative for shortness of breath.   Cardiovascular: Negative for chest pain.  Gastrointestinal: Negative for nausea, vomiting and abdominal pain.  Genitourinary: Negative for dysuria.  Musculoskeletal: Positive for myalgias. Negative for neck pain.  Neurological: Positive for headaches.    Allergies  Codeine and Penicillins  Home Medications   Current Outpatient Rx  Name  Route  Sig  Dispense  Refill  .  desoximetasone (TOPICORT) 0.25 % cream   Topical   Apply 1 application topically 2 (two) times daily.          . DULoxetine (CYMBALTA) 30 MG capsule   Oral   Take 30 mg by mouth 2 (two) times daily.          . furosemide (LASIX) 20 MG tablet   Oral   Take 20 mg by mouth daily.         Marland Kitchen gabapentin (NEURONTIN) 300 MG capsule   Oral   Take 600 mg by mouth 2 (two) times daily.          . Ipratropium-Albuterol (COMBIVENT RESPIMAT) 20-100 MCG/ACT AERS respimat   Inhalation   Inhale 1 puff into the lungs every 6 (six) hours as needed for wheezing.         . lamoTRIgine (LAMICTAL) 25 MG tablet   Oral   Take 25 mg by mouth 2 (two) times daily.         Marland Kitchen omeprazole (PRILOSEC) 40 MG capsule   Oral   Take 40 mg by mouth daily.         .  traZODone (DESYREL) 50 MG tablet   Oral   Take 100 mg by mouth at bedtime.          Marland Kitchen albuterol (PROVENTIL HFA;VENTOLIN HFA) 108 (90 BASE) MCG/ACT inhaler   Inhalation   Inhale 2 puffs into the lungs every 4 (four) hours as needed for wheezing or shortness of breath.   1 Inhaler   0   . azithromycin (ZITHROMAX) 200 MG/5ML suspension   Oral   Take 5 mLs (200 mg total) by mouth daily.   22.5 mL   0   . oseltamivir (TAMIFLU) 75 MG capsule   Oral   Take 1 capsule (75 mg total) by mouth every 12 (twelve) hours.   10 capsule   0   . predniSONE (DELTASONE) 50 MG tablet   Oral   Take 1 tablet (50 mg total) by mouth daily.   5 tablet   0    BP 109/79  Pulse 90  Temp(Src) 98.5 F (36.9 C) (Oral)  Resp 18  Ht 5\' 8"  (1.727 m)  Wt 250 lb (113.399 kg)  BMI 38.02 kg/m2  SpO2 94%  LMP 02/27/2011 Physical Exam  Nursing note and vitals reviewed. Constitutional: She is oriented to person, place, and time. She appears well-developed and well-nourished.  HENT:  Head: Normocephalic and atraumatic.  Eyes: EOM are normal. Pupils are equal, round, and reactive to light.  Neck: Neck supple.  Cardiovascular: Normal rate, regular rhythm and normal heart sounds.   No murmur heard. Pulmonary/Chest: Effort normal. No respiratory distress. She has wheezes.  Abdominal: Soft. She exhibits no distension. There is no tenderness. There is no rebound and no guarding.  Neurological: She is alert and oriented to person, place, and time.  Skin: Skin is warm and dry.    ED Course  Procedures (including critical care time) Labs Review Labs Reviewed  CBC WITH DIFFERENTIAL - Abnormal; Notable for the following:    WBC 3.0 (*)    Platelets 105 (*)    Neutro Abs 1.4 (*)    All other components within normal limits  BASIC METABOLIC PANEL - Abnormal; Notable for the following:    Creatinine, Ser 1.15 (*)    GFR calc non Af Amer 56 (*)    GFR calc Af Amer 65 (*)    All other components within  normal limits   Imaging Review No results found.  EKG Interpretation   None       MDM   1. Bronchitis    Pt comes in with cc of URI like sx. ? Flu - but is diabetic and within the window - so we discussed the benefits of tamiflu- and i will give her the rx - she can fill it up is she so desires. Has some wheezing on my exam -so will give her some prednisone as well.   Varney Biles, MD 01/23/13 [phone]

## 2013-02-22 ENCOUNTER — Emergency Department (HOSPITAL_COMMUNITY)
Admission: EM | Admit: 2013-02-22 | Discharge: 2013-02-22 | Disposition: A | Discharge status: Home / Self Care | Payer: Medicaid Other | Attending: Emergency Medicine | Admitting: Emergency Medicine

## 2013-02-22 ENCOUNTER — Emergency Department (HOSPITAL_COMMUNITY): Payer: Medicaid Other

## 2013-02-22 ENCOUNTER — Encounter (HOSPITAL_COMMUNITY): Payer: Self-pay | Admitting: Emergency Medicine

## 2013-02-22 DIAGNOSIS — Z8669 Personal history of other diseases of the nervous system and sense organs: Secondary | ICD-10-CM | POA: Insufficient documentation

## 2013-02-22 DIAGNOSIS — F411 Generalized anxiety disorder: Secondary | ICD-10-CM | POA: Insufficient documentation

## 2013-02-22 DIAGNOSIS — Z8711 Personal history of peptic ulcer disease: Secondary | ICD-10-CM | POA: Insufficient documentation

## 2013-02-22 DIAGNOSIS — M129 Arthropathy, unspecified: Secondary | ICD-10-CM | POA: Insufficient documentation

## 2013-02-22 DIAGNOSIS — Z880 Allergy status to penicillin: Secondary | ICD-10-CM | POA: Insufficient documentation

## 2013-02-22 DIAGNOSIS — M1710 Unilateral primary osteoarthritis, unspecified knee: Secondary | ICD-10-CM | POA: Insufficient documentation

## 2013-02-22 DIAGNOSIS — F17200 Nicotine dependence, unspecified, uncomplicated: Secondary | ICD-10-CM | POA: Insufficient documentation

## 2013-02-22 DIAGNOSIS — J45909 Unspecified asthma, uncomplicated: Secondary | ICD-10-CM | POA: Insufficient documentation

## 2013-02-22 DIAGNOSIS — N189 Chronic kidney disease, unspecified: Secondary | ICD-10-CM | POA: Insufficient documentation

## 2013-02-22 DIAGNOSIS — F329 Major depressive disorder, single episode, unspecified: Secondary | ICD-10-CM | POA: Insufficient documentation

## 2013-02-22 DIAGNOSIS — Z79899 Other long term (current) drug therapy: Secondary | ICD-10-CM | POA: Insufficient documentation

## 2013-02-22 DIAGNOSIS — IMO0002 Reserved for concepts with insufficient information to code with codable children: Secondary | ICD-10-CM

## 2013-02-22 DIAGNOSIS — E119 Type 2 diabetes mellitus without complications: Secondary | ICD-10-CM | POA: Insufficient documentation

## 2013-02-22 DIAGNOSIS — J42 Unspecified chronic bronchitis: Secondary | ICD-10-CM

## 2013-02-22 DIAGNOSIS — F3289 Other specified depressive episodes: Secondary | ICD-10-CM | POA: Insufficient documentation

## 2013-02-22 DIAGNOSIS — K219 Gastro-esophageal reflux disease without esophagitis: Secondary | ICD-10-CM | POA: Insufficient documentation

## 2013-02-22 DIAGNOSIS — F419 Anxiety disorder, unspecified: Secondary | ICD-10-CM

## 2013-02-22 DIAGNOSIS — IMO0001 Reserved for inherently not codable concepts without codable children: Secondary | ICD-10-CM | POA: Insufficient documentation

## 2013-02-22 LAB — CBC
HCT: 43 % (ref 36.0–46.0)
Hemoglobin: 14.6 g/dL (ref 12.0–15.0)
MCH: 31.3 pg (ref 26.0–34.0)
MCHC: 34 g/dL (ref 30.0–36.0)
MCV: 92.3 fL (ref 78.0–100.0)
Platelets: 136 10*3/uL — ABNORMAL LOW (ref 150–400)
RBC: 4.66 MIL/uL (ref 3.87–5.11)
RDW: 14.7 % (ref 11.5–15.5)
WBC: 4.5 10*3/uL (ref 4.0–10.5)

## 2013-02-22 LAB — POCT I-STAT TROPONIN I: Troponin i, poc: 0 ng/mL (ref 0.00–0.08)

## 2013-02-22 MED ORDER — LORAZEPAM 0.5 MG PO TABS: 1.0000 mg | ORAL_TABLET | Freq: Three times a day (TID) | ORAL | Status: DC | PRN

## 2013-02-22 MED ORDER — LORAZEPAM 1 MG PO TABS
1.0000 mg | ORAL_TABLET | Freq: Once | ORAL | Status: AC
  Administered 2013-02-22: 1 mg via ORAL
  Filled 2013-02-22: qty 1

## 2013-02-22 MED ORDER — IBUPROFEN 600 MG PO TABS: 600.0000 mg | ORAL_TABLET | Freq: Four times a day (QID) | ORAL | Status: DC | PRN

## 2013-02-22 MED ORDER — IBUPROFEN 400 MG PO TABS
600.0000 mg | ORAL_TABLET | Freq: Once | ORAL | Status: AC
  Administered 2013-02-22: 600 mg via ORAL
  Filled 2013-02-22 (×2): qty 1

## 2013-02-22 NOTE — ED Notes (Signed)
Pt to ED c/o L sided chest pain that radiates down L arm x 1 week.  Also c/o cough and high anxiety d/t her daughter getting her arrested and the fact that she has to go to court tomorrow.

## 2013-02-22 NOTE — ED Provider Notes (Signed)
CSN: [id number]     Arrival date & time 02/22/13  1520 History   First MD Initiated Contact with Patient 02/22/13 2010     Chief Complaint  Patient presents with  . Chest Pain  . Cough     (Consider location/radiation/quality/duration/timing/severity/associated sxs/prior Treatment) HPI Comments: Patient with a history of chronic bronchitis, who continues to smoke on a daily basis.  Reports increased anxiety over the last week.  She had to move back in with her mother within the past several months.  She is having an ongoing dispute with her daughter, who had arrested, and she is to court tomorrow.  Her car is now broken down and increasing her anxiety.  She's had left sided chest pain with radiation to her, arm.  That's been pretty continuous.  For the past, week 3, not suicidal or homicidal.  She's not taking any medication for her anxiety or her pain.  Because she "doesn't have anything."  She attempted to get to her primary care physician today, but do to her lack of transportation was unable to get there.  Patient is a 48 y.o. female presenting with chest pain and cough. The history is provided by the patient.  Chest Pain Pain location:  L chest Pain quality: dull   Pain radiates to:  L arm Pain radiates to the back: no   Pain severity:  Mild Onset quality:  Gradual Duration:  1 week Timing:  Constant Progression:  Unchanged Chronicity:  Recurrent Context: stress   Relieved by:  None tried Worsened by:  Coughing Ineffective treatments:  None tried Associated symptoms: cough   Associated symptoms: no dizziness, no fever and no shortness of breath   Cough:    Cough characteristics:  Non-productive   Severity:  Moderate   Onset quality:  Gradual   Timing:  Constant   Progression:  Unchanged   Chronicity:  Chronic Cough Cough characteristics:  Non-productive Severity:  Moderate Onset quality:  Unable to specify Timing:  Intermittent Progression:  Unchanged Chronicity:   Chronic Smoker: yes   Relieved by:  Nothing Ineffective treatments:  None tried Associated symptoms: chest pain and myalgias   Associated symptoms: no chills, no fever, no rash and no shortness of breath     Past Medical History  Diagnosis Date  . Asthma   . GERD (gastroesophageal reflux disease)   . Peptic ulcer disease 1984  . Episodic tension type headache   . Arthritis     hips, knees and spine  . Anxiety   . Depression   . Carpal tunnel syndrome of left wrist   . Barrett's esophagus   . Gastroparesis   . DM (diabetes mellitus)   . IBS (irritable bowel syndrome)   . Narcotic abuse   . Chronic kidney disease   . Bronchitis    Past Surgical History  Procedure Laterality Date  . Cervical fusion  2000    x2  . Joint replacement  2009    left knee x3  . Back surgery  2008    lumbar  fusion  . Cholecystectomy  1991  . Carpal tunnel release  2011    right hand  . Carpal tunnel release  11/23/2010    Procedure: CARPAL TUNNEL RELEASE;  Surgeon: Meredith Pel;  Location: [place] [facility];  Service: Orthopedics;  Laterality: Left;  revision left carpal tunnel release neurolysis of median nerve  . Esophagogastroduodenoscopy  03/20/2011    Procedure: ESOPHAGOGASTRODUODENOSCOPY (EGD);  Surgeon: Irene Shipper, MD;  Location:  [facility];  Service: Endoscopy;  Laterality: N/A;  Clarise Cruz /ja  . Knee arthroscopy      right  . Carpectomy  10/05/2011    Procedure: CARPECTOMY;  Surgeon: Tennis Must, MD;  Location: [place];  Service: Orthopedics;  Laterality: Left;  left proximal row carpectomy bone grafting capitate and radial styloidectomy  . Tubal ligation     Family History  Problem Relation Age of Onset  . Esophageal cancer Father   . Clotting disorder Daughter   . Colon cancer Neg Hx   . Rectal cancer Neg Hx   . Stomach cancer Neg Hx    History  Substance Use Topics  . Smoking status: Current Every Day Smoker -- 0.50 packs/day for 30 years    Types: Cigarettes     Last Attempt to Quit: 04/28/2011  . Smokeless tobacco: Never Used  . Alcohol Use: No   OB History   Grav Para Term Preterm Abortions TAB SAB Ect Mult Living                 Review of Systems  Constitutional: Negative for fever and chills.  Respiratory: Positive for cough. Negative for shortness of breath.   Cardiovascular: Positive for chest pain.  Musculoskeletal: Positive for myalgias.  Skin: Negative for rash.  Neurological: Negative for dizziness.  Psychiatric/Behavioral: The patient is nervous/anxious.       Allergies  Codeine and Penicillins  Home Medications   Current Outpatient Rx  Name  Route  Sig  Dispense  Refill  . DULoxetine (CYMBALTA) 30 MG capsule   Oral   Take 30 mg by mouth 2 (two) times daily.          . furosemide (LASIX) 20 MG tablet   Oral   Take 20 mg by mouth daily.         Marland Kitchen gabapentin (NEURONTIN) 300 MG capsule   Oral   Take 600 mg by mouth 2 (two) times daily.          . Ipratropium-Albuterol (COMBIVENT RESPIMAT) 20-100 MCG/ACT AERS respimat   Inhalation   Inhale 2 puffs into the lungs 3 (three) times daily as needed for wheezing.          . lamoTRIgine (LAMICTAL) 25 MG tablet   Oral   Take 25 mg by mouth 2 (two) times daily.         . traZODone (DESYREL) 50 MG tablet   Oral   Take 100 mg by mouth at bedtime.          Marland Kitchen ibuprofen (ADVIL,MOTRIN) 600 MG tablet   Oral   Take 1 tablet (600 mg total) by mouth every 6 (six) hours as needed.   30 tablet   0   . LORazepam (ATIVAN) 0.5 MG tablet   Oral   Take 2 tablets (1 mg total) by mouth every 8 (eight) hours as needed for anxiety.   10 tablet   0   . omeprazole (PRILOSEC) 40 MG capsule   Oral   Take 40 mg by mouth daily.          BP 148/84  Pulse 65  Temp(Src) 97.9 F (36.6 C) (Oral)  Resp 20  Ht 5' 8.5" (1.74 m)  Wt 307 lb (139.254 kg)  BMI 45.99 kg/m2  SpO2 100%  LMP 02/27/2011 Physical Exam  Nursing note and vitals reviewed. Constitutional: She is  oriented to person, place, and time. She appears well-developed and well-nourished.  Morbidly obese, but well  groomed  HENT:  Head: Normocephalic.  Eyes: Pupils are equal, round, and reactive to light.  Neck: Normal range of motion.  Cardiovascular: Normal rate and regular rhythm.   Pulmonary/Chest: Effort normal and breath sounds normal. No respiratory distress. She has no wheezes. She exhibits no tenderness.  Abdominal: Soft. There is no tenderness.  Gen. exam difficult due to body habitus  Musculoskeletal: Normal range of motion. She exhibits no edema.  Neurological: She is alert and oriented to person, place, and time.  Skin: Skin is warm. No rash noted. No erythema. No pallor.  Psychiatric: Her mood appears anxious.    ED Course  Procedures (including critical care time) Labs Review Labs Reviewed  CBC - Abnormal; Notable for the following:    Platelets 136 (*)    All other components within normal limits  POCT I-STAT TROPONIN I   Imaging Review Dg Chest 2 View  02/22/2013   CLINICAL DATA:  Chest pain, shortness of breath, cough.  EXAM: CHEST  2 VIEW  COMPARISON:  DG CHEST 2 VIEW dated 01/18/2013  FINDINGS: The cardiac silhouette is prominent. No focal regions of consolidation appreciated. Chronic bronchitic changes once again appreciated in the right left hemithoraces. No focal regions of consolidation or focal infiltrates appreciated. The osseous structures demonstrate no evidence of acute abnormalities.  IMPRESSION: Chronic bronchitic changes appreciated less conspicuous when compared to the previous study. A resolving underlying interstitial pneumonitis is of diagnostic consideration. No new focal regions of consolidation are new focal infiltrates.   Electronically Signed   By: Margaree Mackintosh M.D.   On: 02/22/2013 16:07    EKG Interpretation   None       MDM   Final diagnoses:  Bronchitis, chronic  Anxiety     Patient's labs, EKG, and chest x-ray, all reviewed all  within normal limits.  Her troponin is 0.  Her EKG shows a sinus arrhythmia, but no acute changes.  A chest x-ray shows chronic bronchitis.  That is actually improved from her last x-ray from approximately 5 weeks, ago.  No indication of pneumonia.  She has been prescribed 0.5 mg of Ativan to be used every 8 hours as needed.  For extreme anxiety.  She been given a prescription for ibuprofen for her chest wall discomfort, and instructions to follow up with her primary care physician    Garald Balding, NP 02/22/13 2046

## 2013-02-22 NOTE — Discharge Instructions (Signed)
Bronchitis Bronchitis is swelling (inflammation) of the air tubes leading to your lungs (bronchi). This causes mucus and a cough. If the swelling gets bad, you may have trouble breathing. HOME CARE   Rest.  Drink enough fluids to keep your pee (urine) clear or pale yellow (unless you have a condition where you have to watch how much you drink).  Only take medicine as told by your doctor. If you were given antibiotic medicines, finish them even if you start to feel better.  Avoid smoke, irritating chemicals, and strong smells. These make the problem worse. Quit smoking if you smoke. This helps your lungs heal faster.  Use a cool mist humidifier. Change the water in the humidifier every day. You can also sit in the bathroom with hot shower running for 5 10 minutes. Keep the door closed.  See your health care provider as told.  Wash your hands often. GET HELP IF: Your problems do not get better after 1 week. GET HELP RIGHT AWAY IF:   Your fever gets worse.  You have chills.  Your chest hurts.  Your problems breathing get worse.  You have blood in your mucus.  You pass out (faint).  You feel lightheaded.  You have a bad headache.  You throw up (vomit) again and again. MAKE SURE YOU:  Understand these instructions.  Will watch your condition.  Will get help right away if you are not doing well or get worse. Document Released: 06/16/2007 Document Revised: 10/18/2012 Document Reviewed: 08/22/2012 [facility] Patient Information 2014 [place], [place]. As discussed.  Tonight, to her cardiac enzymes, are 0, your chest x-ray, actually is slightly improved from the last x-ray in December, but she still have chronic bronchitic changes.  Her lab work, is all within normal range.  He been given a prescription to help you control your anxiety, and an anti-inflammatory for your discomfort.  Please try to make an appointment with your primary care physician to further discuss your  depression, and anxiety

## 2013-02-24 NOTE — ED Provider Notes (Signed)
Medical screening examination/treatment/procedure(s) were performed by non-physician practitioner and as supervising physician I was immediately available for consultation/collaboration.  EKG Interpretation    Date/Time:  Thursday February 22 2013 15:22:25 EST Ventricular Rate:  75 PR Interval:  144 QRS Duration: 90 QT Interval:  394 QTC Calculation: 439 R Axis:   69 Text Interpretation:  Sinus rhythm with marked sinus arrhythmia Cannot rule out Anterior infarct , age undetermined Abnormal ECG No significant change since last tracing Confirmed by Alvino Chapel  MD, Dajiah Kooi [phone]) on 02/24/2013 8:08:11 AM             Jasper Riling. Alvino Chapel, MD 02/24/13 [phone]

## 2013-03-11 IMAGING — CR DG LUMBAR SPINE 2-3V
3 series · 3 of 3 positions shown · non-contrast
Comparison: CT scan 04/27/2011.

CLINICAL DATA: Fell.  Back pain.

LUMBAR SPINE - 2-3 VIEW

[t l-spine a.p.]
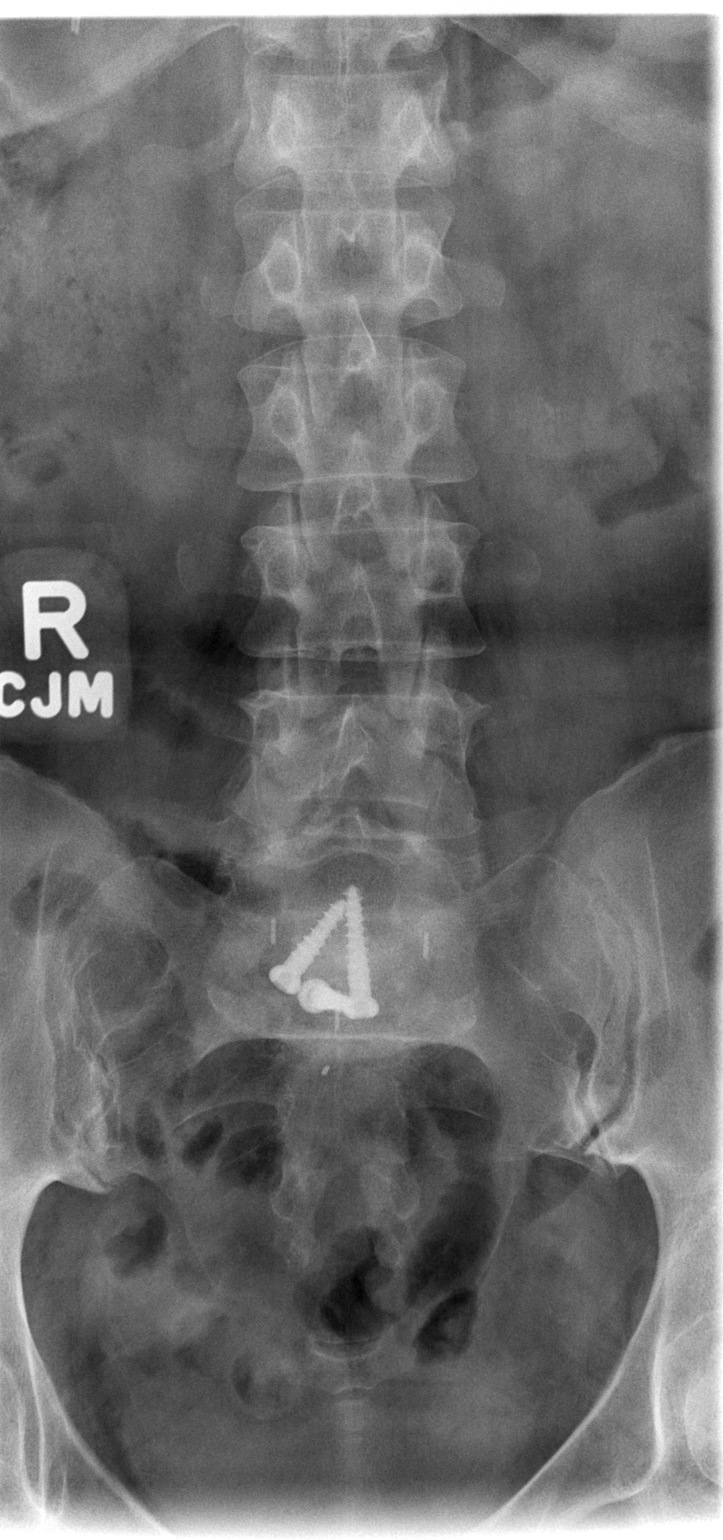

[t l-spine lat]
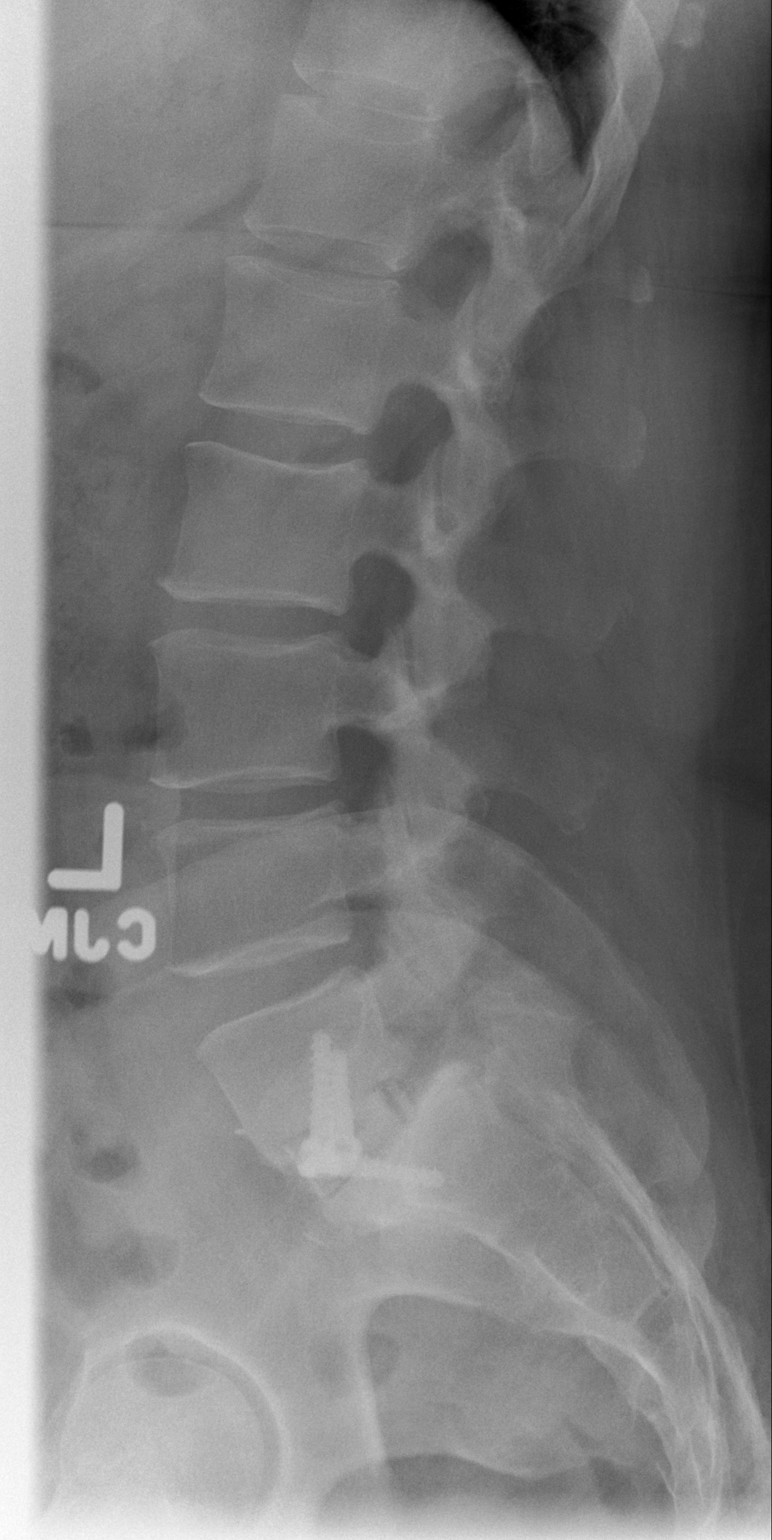

[t l-spine l5-s1 spot]
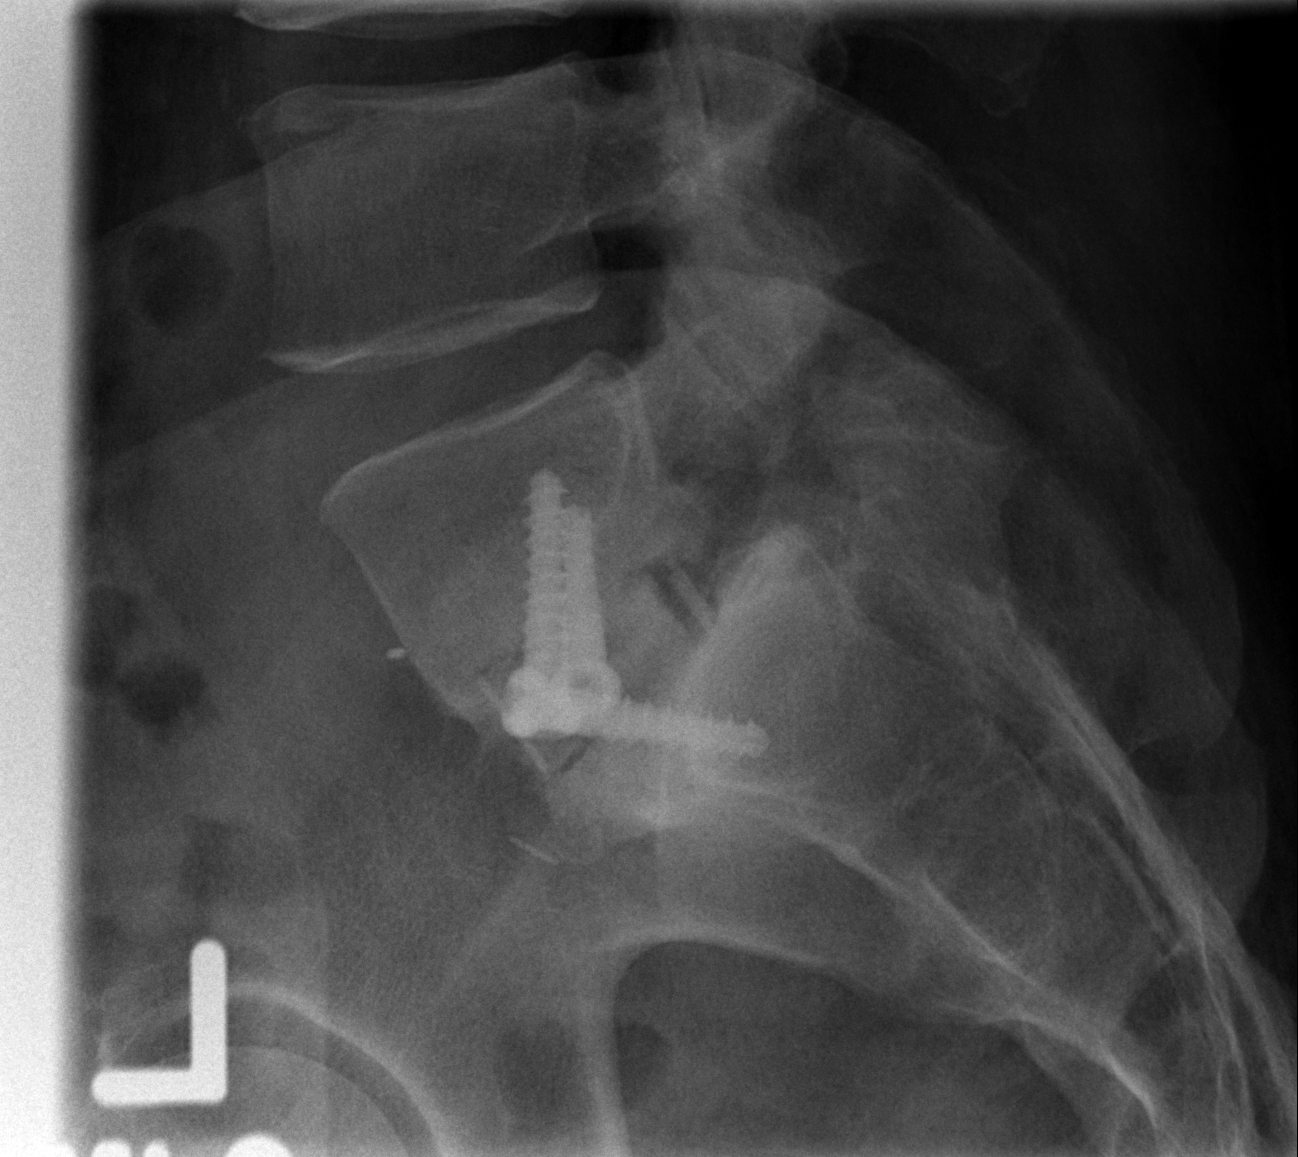

[3 of 3 positions shown; findings below may reference images not displayed]

FINDINGS: Stable appearing anterior and interbody fusion device at
L5-S1.  The lumbar vertebral bodies normally aligned.  Disc spaces
are maintained.  No acute fracture.
IMPRESSION: Normal alignment and no acute bony findings.  Stable anterior and
interbody fusion device at L5-S1.

## 2013-03-11 IMAGING — CT CT HEAD W/O CM
1 of 2 series · 16 of 30 positions shown, 20 images · non-contrast
Comparison: None.

CLINICAL DATA: Frontal headaches, dizziness, and blurred vision
after a fall yesterday.

CT HEAD WITHOUT CONTRAST 05/14/2011:
TECHNIQUE: Contiguous axial images were obtained from the base of
the skull through the vertex without contrast.

[Series 2: head trauma 4.8 h37s · axial · 0.46mm/px · z∈[+1355,+1510]mm · 16 of 36 slices shown, 20 images]
[im 2/36  brain]
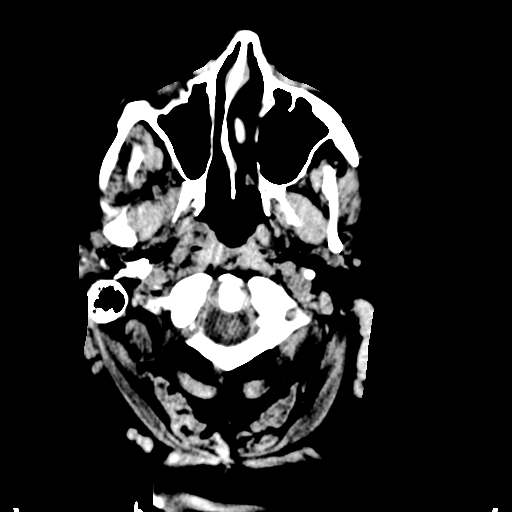
[im 2/36  bone]
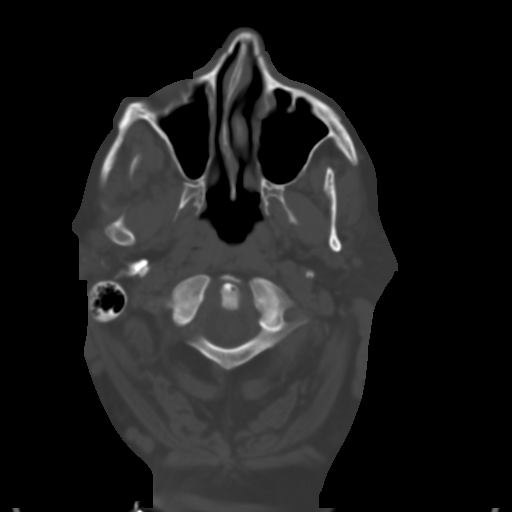
[im 5/36  brain]
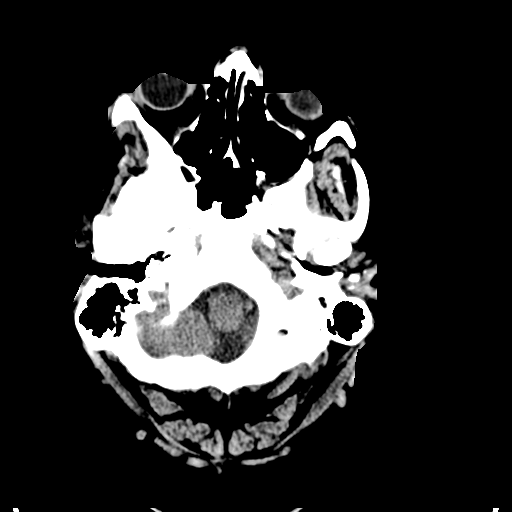
[im 6/36  brain]
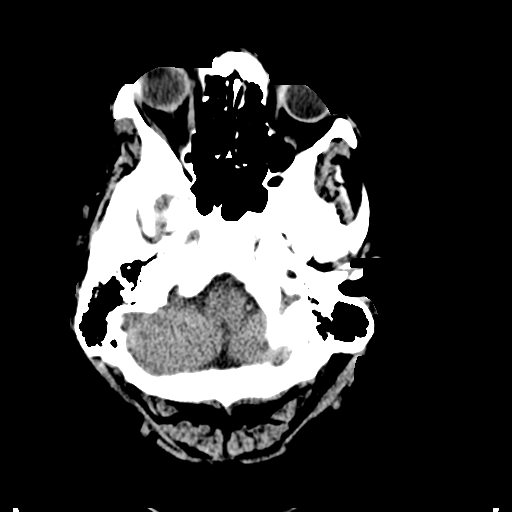
[im 9/36  brain]
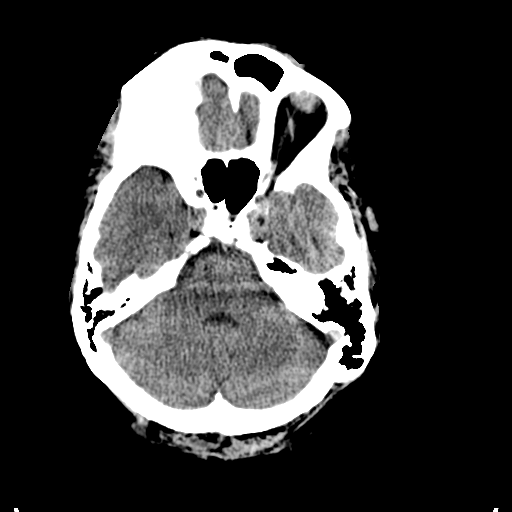
[im 10/36  brain]
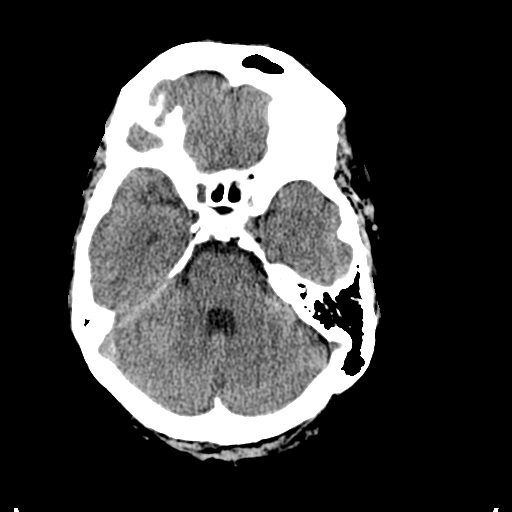
[im 10/36  bone]
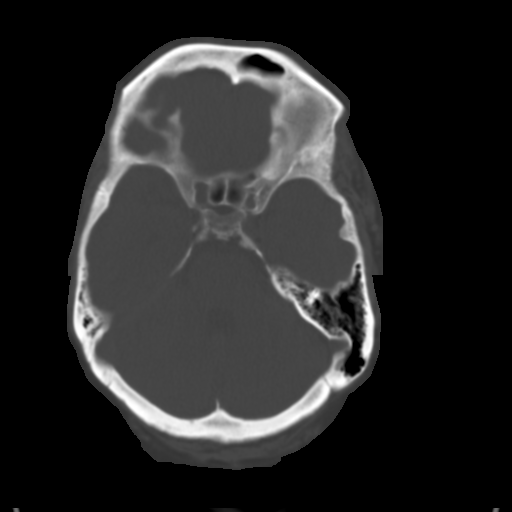
[im 13/36  brain]
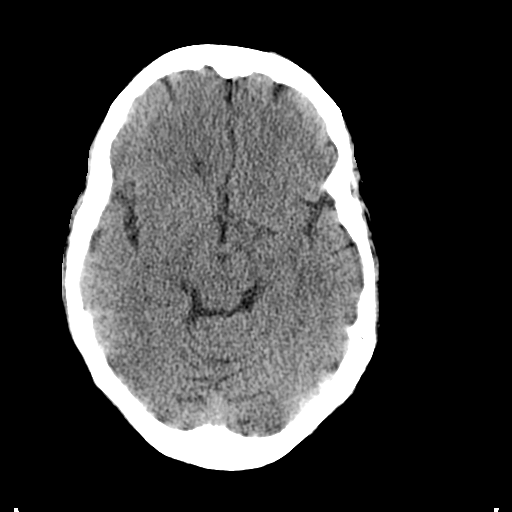
[im 15/36  brain]
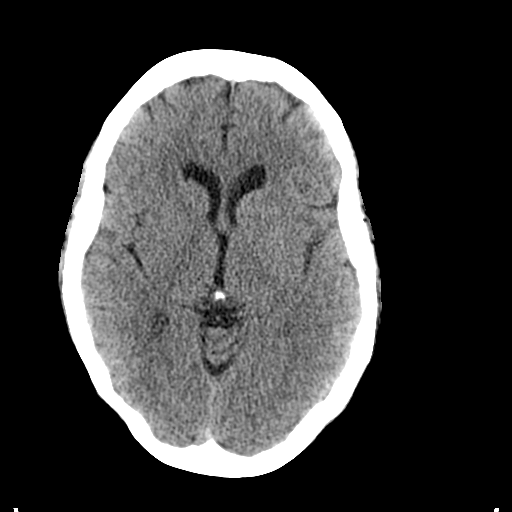
[im 17/36  brain]
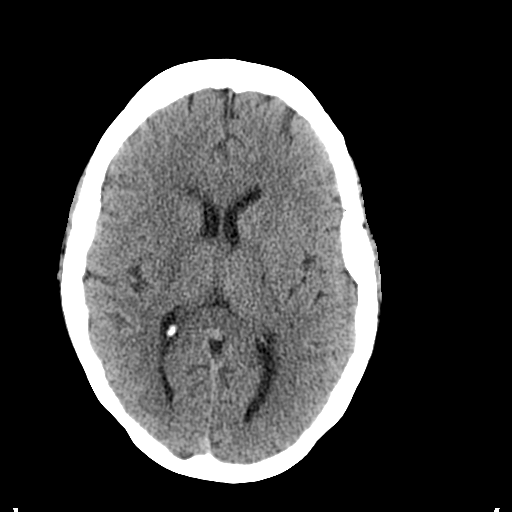
[im 19/36  brain]
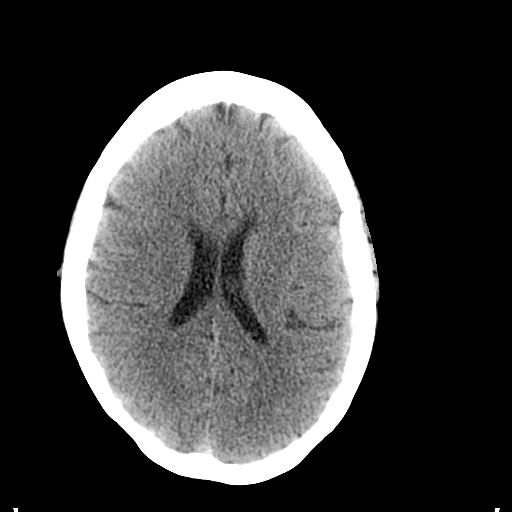
[im 19/36  bone]
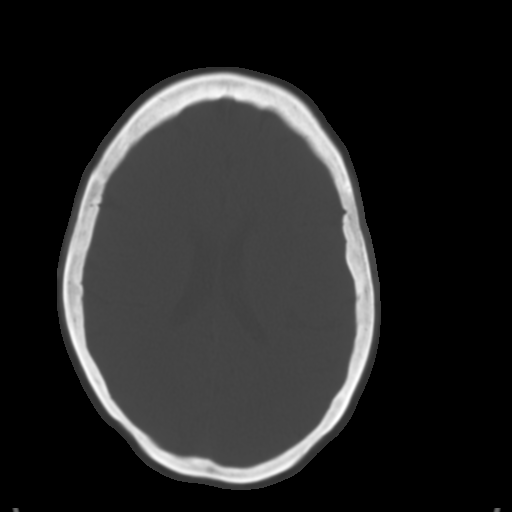
[im 22/36  brain]
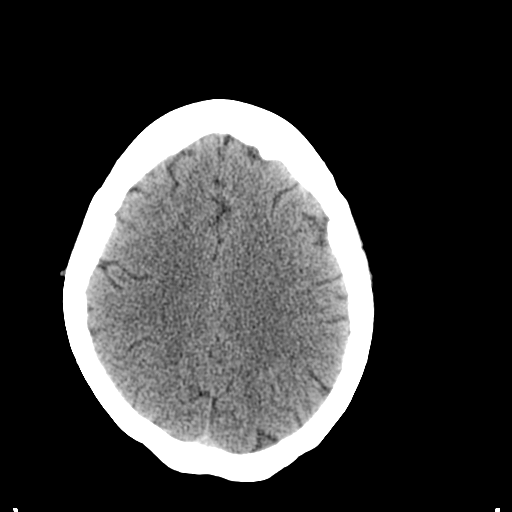
[im 23/36  brain]
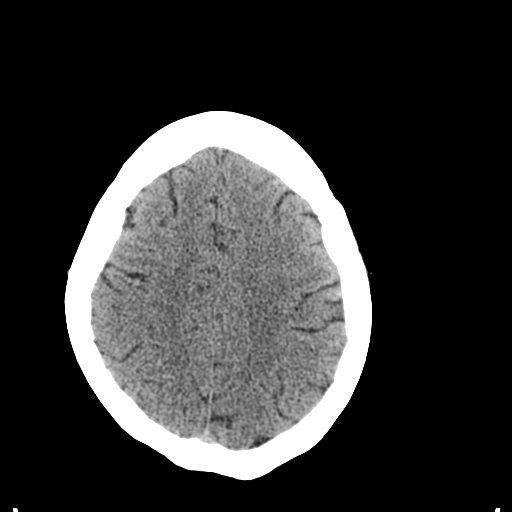
[im 26/36  brain]
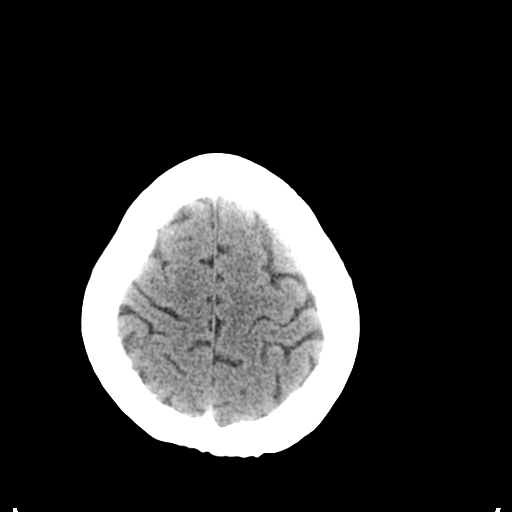
[im 27/36  brain]
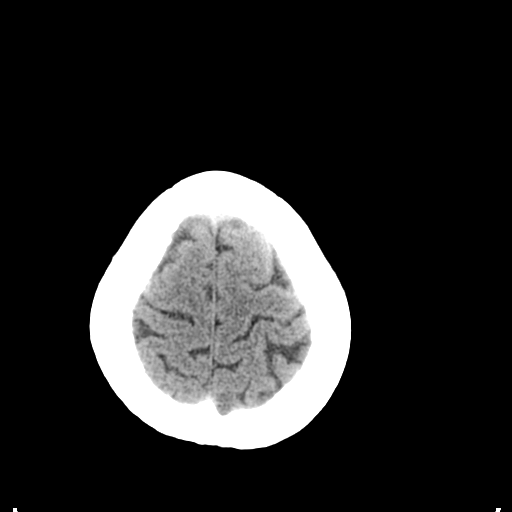
[im 27/36  bone]
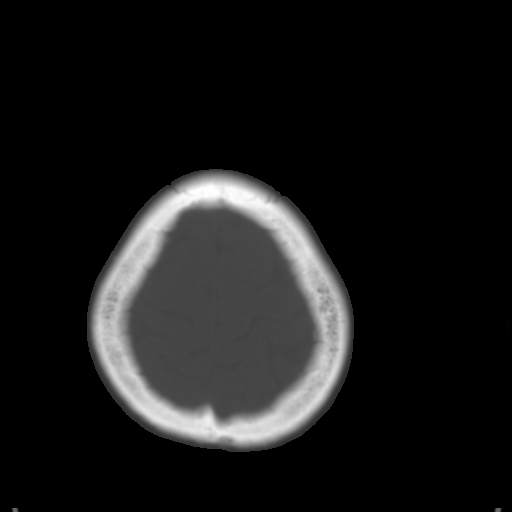
[im 30/36  brain]
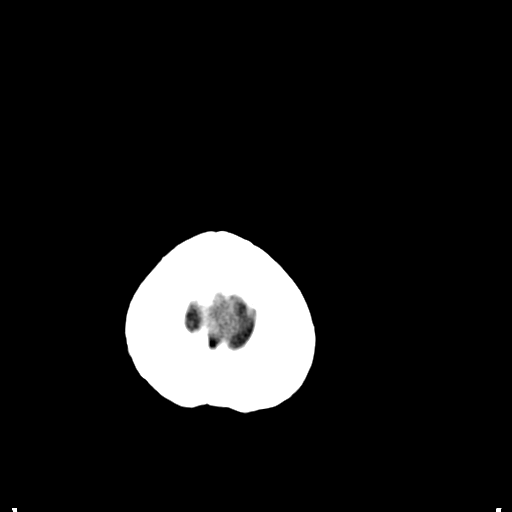
[im 31/36  brain]
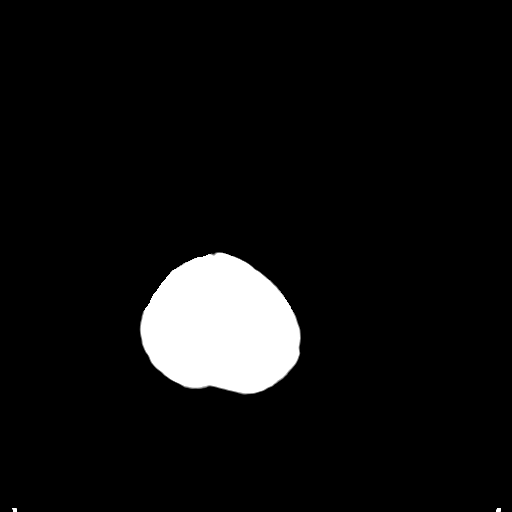
[im 34/36  brain]
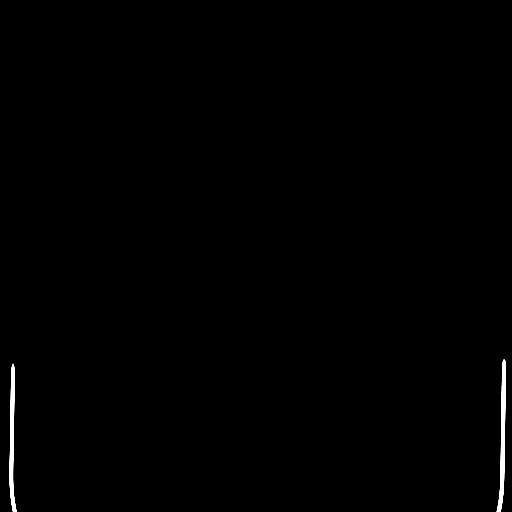

[16 of 30 positions shown; findings below may reference images not displayed]

FINDINGS: Ventricular system normal in size and appearance for age.
No mass lesion.  No midline shift.  No acute hemorrhage or
hematoma.  No extra-axial fluid collections.  No evidence of acute
infarction.  No focal brain parenchymal abnormalities.

No skull fractures or other focal osseous abnormalities involving
the skull.  Visualized paranasal sinuses, mastoid air cells, and
middle ear cavities well-aerated.  Note is made of a bony nasal
septal deviation to the right.
IMPRESSION: Normal unenhanced cranial CT.

## 2013-03-26 ENCOUNTER — Encounter (HOSPITAL_COMMUNITY): Payer: Self-pay | Admitting: Emergency Medicine

## 2013-03-26 ENCOUNTER — Emergency Department (HOSPITAL_COMMUNITY)
Admission: EM | Admit: 2013-03-26 | Discharge: 2013-03-26 | Disposition: A | Discharge status: Home / Self Care | Payer: Medicaid Other | Attending: Emergency Medicine | Admitting: Emergency Medicine

## 2013-03-26 ENCOUNTER — Emergency Department (HOSPITAL_COMMUNITY): Payer: Medicaid Other

## 2013-03-26 DIAGNOSIS — F17200 Nicotine dependence, unspecified, uncomplicated: Secondary | ICD-10-CM | POA: Insufficient documentation

## 2013-03-26 DIAGNOSIS — F411 Generalized anxiety disorder: Secondary | ICD-10-CM | POA: Insufficient documentation

## 2013-03-26 DIAGNOSIS — Z8669 Personal history of other diseases of the nervous system and sense organs: Secondary | ICD-10-CM | POA: Insufficient documentation

## 2013-03-26 DIAGNOSIS — M25551 Pain in right hip: Secondary | ICD-10-CM

## 2013-03-26 DIAGNOSIS — Z79899 Other long term (current) drug therapy: Secondary | ICD-10-CM | POA: Insufficient documentation

## 2013-03-26 DIAGNOSIS — F3289 Other specified depressive episodes: Secondary | ICD-10-CM | POA: Insufficient documentation

## 2013-03-26 DIAGNOSIS — Z880 Allergy status to penicillin: Secondary | ICD-10-CM | POA: Insufficient documentation

## 2013-03-26 DIAGNOSIS — M129 Arthropathy, unspecified: Secondary | ICD-10-CM | POA: Insufficient documentation

## 2013-03-26 DIAGNOSIS — F329 Major depressive disorder, single episode, unspecified: Secondary | ICD-10-CM | POA: Insufficient documentation

## 2013-03-26 DIAGNOSIS — M25559 Pain in unspecified hip: Secondary | ICD-10-CM | POA: Insufficient documentation

## 2013-03-26 DIAGNOSIS — Z8711 Personal history of peptic ulcer disease: Secondary | ICD-10-CM | POA: Insufficient documentation

## 2013-03-26 DIAGNOSIS — K219 Gastro-esophageal reflux disease without esophagitis: Secondary | ICD-10-CM | POA: Insufficient documentation

## 2013-03-26 DIAGNOSIS — M1610 Unilateral primary osteoarthritis, unspecified hip: Secondary | ICD-10-CM | POA: Insufficient documentation

## 2013-03-26 DIAGNOSIS — N189 Chronic kidney disease, unspecified: Secondary | ICD-10-CM | POA: Insufficient documentation

## 2013-03-26 DIAGNOSIS — M1990 Unspecified osteoarthritis, unspecified site: Secondary | ICD-10-CM

## 2013-03-26 DIAGNOSIS — E119 Type 2 diabetes mellitus without complications: Secondary | ICD-10-CM | POA: Insufficient documentation

## 2013-03-26 DIAGNOSIS — M1710 Unilateral primary osteoarthritis, unspecified knee: Secondary | ICD-10-CM | POA: Insufficient documentation

## 2013-03-26 DIAGNOSIS — IMO0002 Reserved for concepts with insufficient information to code with codable children: Secondary | ICD-10-CM

## 2013-03-26 DIAGNOSIS — J45909 Unspecified asthma, uncomplicated: Secondary | ICD-10-CM | POA: Insufficient documentation

## 2013-03-26 MED ORDER — MELOXICAM 7.5 MG PO TABS: 7.5000 mg | ORAL_TABLET | Freq: Every day | ORAL | Status: DC

## 2013-03-26 MED ORDER — KETOROLAC TROMETHAMINE 60 MG/2ML IM SOLN
60.0000 mg | Freq: Once | INTRAMUSCULAR | Status: AC
  Administered 2013-03-26: 60 mg via INTRAMUSCULAR
  Filled 2013-03-26: qty 2

## 2013-03-26 NOTE — ED Provider Notes (Signed)
CSN: [id number]     Arrival date & time 03/26/13  1557 History   First MD Initiated Contact with Patient 03/26/13 1641   This chart was scribed for non-physician practitioner Jamse Mead, PA-C working with Saddie Benders. Dorna Mai, MD by Anastasia Pall, ED scribe. This patient was seen in room WTR6/WTR6 and the patient's care was started at 5:54 PM.   Chief Complaint  Patient presents with  . Hip Pain   (Consider location/radiation/quality/duration/timing/severity/associated sxs/prior Treatment) The history is provided by the patient. No language interpreter was used.   HPI Comments: Michelle [name] is a 48 y.o. female with h/o back surgery 2008 and sciatica who presents to the Emergency Department complaining of gradually worsening, constant, right hip pain, that radiates down her right LE, onset 2 days ago. She reports difficulty ambulating due to her pain. She denies h/o hip fx/injury, but reports having neck surgery in 1994 where they took out bone from her right hip to place in her neck. She has taken Ibuprofen, Tylenol, and Good powder, without relief. She denies falling, LOC, head trauma, and any other associated symptoms. She has allergies to Codeine and Penicillin.   PCP - Harvie Junior, MD  Past Medical History  Diagnosis Date  . Asthma   . GERD (gastroesophageal reflux disease)   . Peptic ulcer disease 1984  . Episodic tension type headache   . Arthritis     hips, knees and spine  . Anxiety   . Depression   . Carpal tunnel syndrome of left wrist   . Barrett's esophagus   . Gastroparesis   . DM (diabetes mellitus)   . IBS (irritable bowel syndrome)   . Narcotic abuse   . Chronic kidney disease   . Bronchitis    Past Surgical History  Procedure Laterality Date  . Cervical fusion  2000    x2  . Joint replacement  2009    left knee x3  . Back surgery  2008    lumbar  fusion  . Cholecystectomy  1991  . Carpal tunnel release  2011    right hand  . Carpal tunnel  release  11/23/2010    Procedure: CARPAL TUNNEL RELEASE;  Surgeon: Meredith Pel;  Location: [facility];  Service: Orthopedics;  Laterality: Left;  revision left carpal tunnel release neurolysis of median nerve  . Esophagogastroduodenoscopy  03/20/2011    Procedure: ESOPHAGOGASTRODUODENOSCOPY (EGD);  Surgeon: Irene Shipper, MD;  Location: [facility];  Service: Endoscopy;  Laterality: N/A;  Clarise Cruz /ja  . Knee arthroscopy      right  . Carpectomy  10/05/2011    Procedure: CARPECTOMY;  Surgeon: Tennis Must, MD;  Location: [place];  Service: Orthopedics;  Laterality: Left;  left proximal row carpectomy bone grafting capitate and radial styloidectomy  . Tubal ligation     Family History  Problem Relation Age of Onset  . Esophageal cancer Father   . Clotting disorder Daughter   . Colon cancer Neg Hx   . Rectal cancer Neg Hx   . Stomach cancer Neg Hx    History  Substance Use Topics  . Smoking status: Current Every Day Smoker -- 0.50 packs/day for 30 years    Types: Cigarettes    Last Attempt to Quit: 04/28/2011  . Smokeless tobacco: Never Used  . Alcohol Use: No   OB History   Grav Para Term Preterm Abortions TAB SAB Ect Mult Living  Review of Systems  Musculoskeletal: Positive for arthralgias (right hip pain).  Skin: Negative for wound.  Neurological: Negative for syncope and headaches.  All other systems reviewed and are negative.   Allergies  Codeine and Penicillins  Home Medications   Current Outpatient Rx  Name  Route  Sig  Dispense  Refill  . DULoxetine (CYMBALTA) 30 MG capsule   Oral   Take 30 mg by mouth 2 (two) times daily.          . furosemide (LASIX) 20 MG tablet   Oral   Take 20 mg by mouth daily.         Marland Kitchen gabapentin (NEURONTIN) 300 MG capsule   Oral   Take 600 mg by mouth 2 (two) times daily.          Marland Kitchen ibuprofen (ADVIL,MOTRIN) 200 MG tablet   Oral   Take 400 mg by mouth every 6 (six) hours as needed.          . Ipratropium-Albuterol (COMBIVENT RESPIMAT) 20-100 MCG/ACT AERS respimat   Inhalation   Inhale 2 puffs into the lungs 3 (three) times daily as needed for wheezing.          . lamoTRIgine (LAMICTAL) 25 MG tablet   Oral   Take 25 mg by mouth 2 (two) times daily.         Marland Kitchen omeprazole (PRILOSEC) 40 MG capsule   Oral   Take 40 mg by mouth daily.         . traZODone (DESYREL) 50 MG tablet   Oral   Take 100 mg by mouth at bedtime.          . meloxicam (MOBIC) 7.5 MG tablet   Oral   Take 1 tablet (7.5 mg total) by mouth daily.   15 tablet   0    BP 110/74  Pulse 87  Temp(Src) 98.7 F (37.1 C) (Oral)  Resp 18  SpO2 97%  LMP 02/27/2011  Physical Exam  Nursing note and vitals reviewed. Constitutional: She is oriented to person, place, and time. She appears well-developed and well-nourished. No distress.  HENT:  Head: Normocephalic and atraumatic.  Mouth/Throat: Oropharynx is clear and moist.  Eyes: Conjunctivae and EOM are normal. Right eye exhibits no discharge. Left eye exhibits no discharge.  Neck: Normal range of motion. Neck supple. No tracheal deviation present.  Cardiovascular: Normal rate, regular rhythm and normal heart sounds.  Exam reveals no friction rub.   No murmur heard. Pulses:      Radial pulses are 2+ on the right side, and 2+ on the left side.       Dorsalis pedis pulses are 2+ on the right side, and 2+ on the left side.  Pulmonary/Chest: Effort normal and breath sounds normal. No respiratory distress. She has no wheezes. She has no rales.  Musculoskeletal: She exhibits tenderness.  Negative deformities, swelling, erythema, inflammation, lesions, sores, ecchymosis noted to the right hip or pelvic girdle. Negative ecchymosis noted. Negative crepitus upon palpation. Discomfort upon palpation to the right acetabulum region. Decreased flexion, extension, abduction, adduction secondary to pain with motion to the right hip.   Neurological: She is alert and  oriented to person, place, and time. She exhibits normal muscle tone. Coordination normal.  Cranial nerves III-XII grossly intact Strength 5+/5+ to lower extremities bilaterally with resistance applied, equal distribution noted Strength intact to digits of the feet bilaterally Sensation intact with differentiation sharp and dull touch to lower extremities bilaterally  Skin: Skin  is warm and dry.  Psychiatric: She has a normal mood and affect. Her behavior is normal.    ED Course  Procedures (including critical care time)  DIAGNOSTIC STUDIES: Oxygen Saturation is 97% on room air, normal by my interpretation.    COORDINATION OF CARE: 6:02 PM-Discussed treatment plan with pt at bedside and pt agreed to plan.   Dg Hip Bilateral W/pelvis  03/26/2013   CLINICAL DATA:  Chronic right greater than left hip pain.  EXAM: BILATERAL HIP WITH PELVIS - 4+ VIEW  COMPARISON:  CT ABD/PELV WO CM dated 10/18/2012; DG LUMBAR SPINE COMPLETE dated 04/01/2012; DG PELVIS 1-2 VIEWS dated 04/01/2012  FINDINGS: Mixed sclerosis and lucency in the right acetabulum as on prior exams, compatible with geodes, degenerative subcortical cysts, and degenerative subcortical sclerosis. Possible mild sclerosis in the right femoral head. Prior L5-S1 fusion.  IMPRESSION: 1. 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 Possible lesser degree of involvement in the right femoral head. 2. Prior fusion at L5-S1.   Electronically Signed   By: Sherryl Barters M.D.   On: 03/26/2013 17:54    EKG Interpretation None     Medications  ketorolac (TORADOL) injection 60 mg (60 mg Intramuscular Given 03/26/13 1833)   MDM   Final diagnoses:  Right hip pain  Arthritis   I personally performed the services described in this documentation, which was scribed in  my presence. The recorded information has been reviewed and is accurate.  Medications  ketorolac (TORADOL) injection 60 mg (60 mg Intramuscular Given 03/26/13 1833)    Filed Vitals:   03/26/13 1639 03/26/13 1837  BP: 110/74 128/77  Pulse: 87 78  Temp: 98.7 F (37.1 C)   TempSrc: Oral   Resp: 18 16  SpO2: 97% 98%   Patient presenting to the ED with right hip pain does been ongoing since Friday. Stated that her car broke down on Friday and patient had to walk home-reported that this aggravated her right hip. Stated that the pain as a constant throbbing sensation, knife stabbing sensation localized to the right hip without radiation. Stated that she's been taking Advil, Tylenol, ibuprofen with minimal relief. Patient has history of sciatica. Patient has history of back pain with surgery back in 2008. Alert and oriented. GCS 15. Heart rate and rhythm normal. Lungs clear to auscultation to upper and lower lobes bilaterally. Radial and DP pulses 2+ bilaterally. Negative deformities identified to the right hip or pelvic girdle. Discomfort upon palpation to right acetabulum of the right hip. Decreased range of motion to the right hip secondary to pain. Full range of motion to remaining right lower extremity. Strength intact with equal distribution. Sensation with differentiation sharp and dull touch. Plain film of bilateral hip with pelvis identified hip arthropathy most severe in the right where there is acetabular spurring, subcortical sclerosis and scattered subcortical lucencies compatible with geodes degenerative subcortical cystic degenerative subcortical sclerosis. Doubt acute osseous injury. Plain film noted severe arthritis in the right hip. Decreased range of motion secondary to pain. Suspicion of pain to be a flareup of the arthritis secondary to increased walking on Friday. Patient stable, afebrile. Patient neurovascularly intact-negative focal neurological deficits identified. Discharged  patient. Discharged patient with anti-inflammatories. discussed with patient that if she is taking Mobic to not taken Ibuprofen at the same time due to increased in GI bleed. Referred patient to primary care provider  and orthopedics. Discussed with patient to rest and stay hydrated. Discussed with patient to closely monitor symptoms and if symptoms are to worsen or change to report back to the ED - strict return instructions given.  Patient agreed to plan of care, understood, all questions answered.   Jamse Mead, PA-C 03/27/13 [place], PA-C 03/27/13 [id number]

## 2013-03-26 NOTE — ED Notes (Signed)
Pt states hx of sciatic pain.  Pt states car broke down and she had to walk a distance.  After walking a while, hip and leg now painful.  Pt having trouble ambulating.

## 2013-03-26 NOTE — Discharge Instructions (Signed)
Please call your doctor for a followup appointment within 24-48 hours. When you talk to your doctor please let them know that you were seen in the emergency department and have them acquire all of your records so that they can discuss the findings with you and formulate a treatment plan to fully care for your new and ongoing problems. Please call and setup an appointment with your primary care provider to be reassessed for the next 24-48 hours Please call and set up an appointment with orthopedics to be reassessed regarding right hip pain X-ray showed no broken bones-severe arthritis identified in the right hip Please rest and stay hydrated Please take medications as prescribed Please avoid any physical strenuous activity Please continue to monitor symptoms closely if symptoms are to worsen or change (fever greater than 101, chills, nausea, vomiting, fall, injury, weakness, numbness, tingling, loss of sensation, changes to appearance of the hip, inability to control urine or bowel movements) please report back to the ED immediately  Arthritis, Nonspecific Arthritis is inflammation of a joint. This usually means pain, redness, warmth or swelling are present. One or more joints may be involved. There are a number of types of arthritis. Your caregiver may not be able to tell what type of arthritis you have right away. CAUSES  The most common cause of arthritis is the wear and tear on the joint (osteoarthritis). This causes damage to the cartilage, which can break down over time. The knees, hips, back and neck are most often affected by this type of arthritis. Other types of arthritis and common causes of joint pain include:  Sprains and other injuries near the joint. Sometimes minor sprains and injuries cause pain and swelling that develop hours later.  Rheumatoid arthritis. This affects hands, feet and knees. It usually affects both sides of your body at the same time. It is often associated with  chronic ailments, fever, weight loss and general weakness.  Crystal arthritis. Gout and pseudo gout can cause occasional acute severe pain, redness and swelling in the foot, ankle, or knee.  Infectious arthritis. Bacteria can get into a joint through a break in overlying skin. This can cause infection of the joint. Bacteria and viruses can also spread through the blood and affect your joints.  Drug, infectious and allergy reactions. Sometimes joints can become mildly painful and slightly swollen with these types of illnesses. SYMPTOMS   Pain is the main symptom.  Your joint or joints can also be red, swollen and warm or hot to the touch.  You may have a fever with certain types of arthritis, or even feel overall ill.  The joint with arthritis will hurt with movement. Stiffness is present with some types of arthritis. DIAGNOSIS  Your caregiver will suspect arthritis based on your description of your symptoms and on your exam. Testing may be needed to find the type of arthritis:  Blood and sometimes urine tests.  X-ray tests and sometimes CT or MRI scans.  Removal of fluid from the joint (arthrocentesis) is done to check for bacteria, crystals or other causes. Your caregiver (or a specialist) will numb the area over the joint with a local anesthetic, and use a needle to remove joint fluid for examination. This procedure is only minimally uncomfortable.  Even with these tests, your caregiver may not be able to tell what kind of arthritis you have. Consultation with a specialist (rheumatologist) may be helpful. TREATMENT  Your caregiver will discuss with you treatment specific to your type of  arthritis. If the specific type cannot be determined, then the following general recommendations may apply. Treatment of severe joint pain includes:  Rest.  Elevation.  Anti-inflammatory medication (for example, ibuprofen) may be prescribed. Avoiding activities that cause increased pain.  Only  take over-the-counter or prescription medicines for pain and discomfort as recommended by your caregiver.  Cold packs over an inflamed joint may be used for 10 to 15 minutes every hour. Hot packs sometimes feel better, but do not use overnight. Do not use hot packs if you are diabetic without your caregiver's permission.  A cortisone shot into arthritic joints may help reduce pain and swelling.  Any acute arthritis that gets worse over the next 1 to 2 days needs to be looked at to be sure there is no joint infection. Long-term arthritis treatment involves modifying activities and lifestyle to reduce joint stress jarring. This can include weight loss. Also, exercise is needed to nourish the joint cartilage and remove waste. This helps keep the muscles around the joint strong. HOME CARE INSTRUCTIONS   Do not take aspirin to relieve pain if gout is suspected. This elevates uric acid levels.  Only take over-the-counter or prescription medicines for pain, discomfort or fever as directed by your caregiver.  Rest the joint as much as possible.  If your joint is swollen, keep it elevated.  Use crutches if the painful joint is in your leg.  Drinking plenty of fluids may help for certain types of arthritis.  Follow your caregiver's dietary instructions.  Try low-impact exercise such as:  Swimming.  Water aerobics.  Biking.  Walking.  Morning stiffness is often relieved by a warm shower.  Put your joints through regular range-of-motion. SEEK MEDICAL CARE IF:   You do not feel better in 24 hours or are getting worse.  You have side effects to medications, or are not getting better with treatment. SEEK IMMEDIATE MEDICAL CARE IF:   You have a fever.  You develop severe joint pain, swelling or redness.  Many joints are involved and become painful and swollen.  There is severe back pain and/or leg weakness.  You have loss of bowel or bladder control. Document Released: 02/05/2004  Document Revised: 03/22/2011 Document Reviewed: 02/21/2008 [facility] Patient Information 2014 [place].

## 2013-03-28 ENCOUNTER — Ambulatory Visit: Payer: Medicaid Other | Admitting: Internal Medicine

## 2013-04-05 ENCOUNTER — Encounter (HOSPITAL_COMMUNITY): Payer: Self-pay | Admitting: Emergency Medicine

## 2013-04-05 ENCOUNTER — Emergency Department (HOSPITAL_COMMUNITY)
Admission: EM | Admit: 2013-04-05 | Discharge: 2013-04-05 | Disposition: A | Discharge status: Home / Self Care | Payer: Medicaid Other | Attending: Emergency Medicine | Admitting: Emergency Medicine

## 2013-04-05 DIAGNOSIS — E119 Type 2 diabetes mellitus without complications: Secondary | ICD-10-CM | POA: Insufficient documentation

## 2013-04-05 DIAGNOSIS — M25569 Pain in unspecified knee: Secondary | ICD-10-CM | POA: Insufficient documentation

## 2013-04-05 DIAGNOSIS — Z791 Long term (current) use of non-steroidal anti-inflammatories (NSAID): Secondary | ICD-10-CM | POA: Insufficient documentation

## 2013-04-05 DIAGNOSIS — N189 Chronic kidney disease, unspecified: Secondary | ICD-10-CM | POA: Insufficient documentation

## 2013-04-05 DIAGNOSIS — M76899 Other specified enthesopathies of unspecified lower limb, excluding foot: Secondary | ICD-10-CM | POA: Insufficient documentation

## 2013-04-05 DIAGNOSIS — F411 Generalized anxiety disorder: Secondary | ICD-10-CM | POA: Insufficient documentation

## 2013-04-05 DIAGNOSIS — Z8711 Personal history of peptic ulcer disease: Secondary | ICD-10-CM | POA: Insufficient documentation

## 2013-04-05 DIAGNOSIS — Z8669 Personal history of other diseases of the nervous system and sense organs: Secondary | ICD-10-CM | POA: Insufficient documentation

## 2013-04-05 DIAGNOSIS — M1990 Unspecified osteoarthritis, unspecified site: Secondary | ICD-10-CM | POA: Insufficient documentation

## 2013-04-05 DIAGNOSIS — Z880 Allergy status to penicillin: Secondary | ICD-10-CM | POA: Insufficient documentation

## 2013-04-05 DIAGNOSIS — F17200 Nicotine dependence, unspecified, uncomplicated: Secondary | ICD-10-CM | POA: Insufficient documentation

## 2013-04-05 DIAGNOSIS — F329 Major depressive disorder, single episode, unspecified: Secondary | ICD-10-CM | POA: Insufficient documentation

## 2013-04-05 DIAGNOSIS — Z96659 Presence of unspecified artificial knee joint: Secondary | ICD-10-CM | POA: Insufficient documentation

## 2013-04-05 DIAGNOSIS — F3289 Other specified depressive episodes: Secondary | ICD-10-CM | POA: Insufficient documentation

## 2013-04-05 DIAGNOSIS — Z79899 Other long term (current) drug therapy: Secondary | ICD-10-CM | POA: Insufficient documentation

## 2013-04-05 DIAGNOSIS — M25551 Pain in right hip: Secondary | ICD-10-CM

## 2013-04-05 DIAGNOSIS — K219 Gastro-esophageal reflux disease without esophagitis: Secondary | ICD-10-CM | POA: Insufficient documentation

## 2013-04-05 DIAGNOSIS — J45909 Unspecified asthma, uncomplicated: Secondary | ICD-10-CM | POA: Insufficient documentation

## 2013-04-05 MED ORDER — TRAMADOL HCL 50 MG PO TABS: 50.0000 mg | ORAL_TABLET | Freq: Four times a day (QID) | ORAL | Status: DC | PRN

## 2013-04-05 NOTE — ED Provider Notes (Signed)
Medical screening examination/treatment/procedure(s) were performed by non-physician practitioner and as supervising physician I was immediately available for consultation/collaboration.  Arjun Hard Y. Ejay Lashley, MD 04/05/13 1549 

## 2013-04-05 NOTE — ED Notes (Signed)
Pt reports hip pain. States that she was diagnosed with bone spurs and followed up with Dr. Berenice Primas. States that she has another appt with Dr. Berenice Primas April 9th but hip pain increased when she was in jail over the wknd and had to sleep on a metal cot. States meds prescribed are not helping her with her hip pain, either.

## 2013-04-05 NOTE — ED Provider Notes (Signed)
CSN: [id number]     Arrival date & time 04/05/13  2115 History  This chart was scribed for non-physician practitioner, Quincy Carnes, PA-C, working with Arbie Cookey, by Celesta Gentile, ED Scribe. This patient was seen in room TR10C/TR10C and the patient's care was started at 10:55 PM.    Chief Complaint  Patient presents with  . Hip Pain   The history is provided by the patient. No language interpreter was used.   HPI Comments: Michelle [name] is a 48 y.o. female who presents to the Emergency Department complaining of constant moderate right hip pain that has progressively worsened over the past several days.  Pt was seen in [facility] for the same, diagnosed with hips bone spurs, and severe osteoarthritis. Patient states she was seen by orthopedics, started on vicodin with plan for total hip replacement. Patient states she was incarcerated over the weekend and had to sleep on a hard mattress which worsened her pain. She denies any numbness or paresthesias of right lower extremity. Pain worse with weightbearing and ambulation.  Has been taking her prescribed vicodin without noted improvement.    Past Medical History  Diagnosis Date  . Asthma   . GERD (gastroesophageal reflux disease)   . Peptic ulcer disease 1984  . Episodic tension type headache   . Arthritis     hips, knees and spine  . Anxiety   . Depression   . Carpal tunnel syndrome of left wrist   . Barrett's esophagus   . Gastroparesis   . DM (diabetes mellitus)   . IBS (irritable bowel syndrome)   . Narcotic abuse   . Chronic kidney disease   . Bronchitis    Past Surgical History  Procedure Laterality Date  . Cervical fusion  2000    x2  . Joint replacement  2009    left knee x3  . Back surgery  2008    lumbar  fusion  . Cholecystectomy  1991  . Carpal tunnel release  2011    right hand  . Carpal tunnel release  11/23/2010    Procedure: CARPAL TUNNEL RELEASE;  Surgeon: Meredith Pel;  Location: [place];   Service: Orthopedics;  Laterality: Left;  revision left carpal tunnel release neurolysis of median nerve  . Esophagogastroduodenoscopy  03/20/2011    Procedure: ESOPHAGOGASTRODUODENOSCOPY (EGD);  Surgeon: Irene Shipper, MD;  Location: [facility] ENDOSCOPY;  Service: Endoscopy;  Laterality: N/A;  Clarise Cruz /ja  . Knee arthroscopy      right  . Carpectomy  10/05/2011    Procedure: CARPECTOMY;  Surgeon: Tennis Must, MD;  Location: [place];  Service: Orthopedics;  Laterality: Left;  left proximal row carpectomy bone grafting capitate and radial styloidectomy  . Tubal ligation     Family History  Problem Relation Age of Onset  . Esophageal cancer Father   . Clotting disorder Daughter   . Colon cancer Neg Hx   . Rectal cancer Neg Hx   . Stomach cancer Neg Hx    History  Substance Use Topics  . Smoking status: Current Every Day Smoker -- 0.50 packs/day for 30 years    Types: Cigarettes    Last Attempt to Quit: 04/28/2011  . Smokeless tobacco: Never Used  . Alcohol Use: No   OB History   Grav Para Term Preterm Abortions TAB SAB Ect Mult Living                 Review of Systems  Constitutional:  Negative for fever and chills.  Gastrointestinal: Negative for nausea, vomiting and abdominal pain.  Musculoskeletal: Positive for arthralgias (R hip), back pain and gait problem. Negative for joint swelling, neck pain and neck stiffness.  Skin: Negative for color change and rash.  All other systems reviewed and are negative.   Allergies  Penicillins  Home Medications   Current Outpatient Rx  Name  Route  Sig  Dispense  Refill  . DULoxetine (CYMBALTA) 30 MG capsule   Oral   Take 30 mg by mouth 2 (two) times daily.          . furosemide (LASIX) 20 MG tablet   Oral   Take 20 mg by mouth daily.         Marland Kitchen gabapentin (NEURONTIN) 300 MG capsule   Oral   Take 600 mg by mouth 2 (two) times daily.          . Ipratropium-Albuterol (COMBIVENT RESPIMAT) 20-100 MCG/ACT AERS  respimat   Inhalation   Inhale 2 puffs into the lungs 3 (three) times daily as needed for wheezing.          . lamoTRIgine (LAMICTAL) 25 MG tablet   Oral   Take 25 mg by mouth 2 (two) times daily.         . meloxicam (MOBIC) 7.5 MG tablet   Oral   Take 1 tablet (7.5 mg total) by mouth daily.   15 tablet   0   . omeprazole (PRILOSEC) 40 MG capsule   Oral   Take 40 mg by mouth daily.         . traZODone (DESYREL) 50 MG tablet   Oral   Take 100 mg by mouth at bedtime.          Marland Kitchen ibuprofen (ADVIL,MOTRIN) 200 MG tablet   Oral   Take 400 mg by mouth every 6 (six) hours as needed for mild pain.          . traMADol (ULTRAM) 50 MG tablet   Oral   Take 1 tablet (50 mg total) by mouth every 6 (six) hours as needed.   15 tablet   0    Triage Vitals: BP 129/68  Pulse 79  Temp(Src) 98.2 F (36.8 C) (Oral)  Resp 18  Ht 5\' 8"  (1.727 m)  Wt 290 lb (131.543 kg)  BMI 44.10 kg/m2  SpO2 96%  LMP 02/27/2011  Physical Exam  Nursing note and vitals reviewed. Constitutional: She is oriented to person, place, and time. She appears well-developed and well-nourished. No distress.  HENT:  Head: Normocephalic and atraumatic.  Eyes: Conjunctivae and EOM are normal. Right eye exhibits no discharge. Left eye exhibits no discharge.  Neck: Neck supple. No tracheal deviation present.  Cardiovascular: Normal rate.   Pulmonary/Chest: Effort normal. No respiratory distress.  Musculoskeletal: She exhibits tenderness.       Right hip: She exhibits decreased range of motion and tenderness. She exhibits normal strength, no bony tenderness, no swelling, no crepitus, no deformity and no laceration.  Right hip diffusely tender to palpation without noted deformities; limited ROM due to pain; distal sensation intact  Neurological: She is alert and oriented to person, place, and time.  Skin: Skin is warm and dry. No rash noted.  Psychiatric: She has a normal mood and affect. Her behavior is  normal.    ED Course  Procedures (including critical care time) DIAGNOSTIC STUDIES: Oxygen Saturation is 96% on RA, adequate by my interpretation.     MDM  Final diagnoses:  Hip pain, right   Right hip pain, recent x-rays revealing bone spurs and severe osteoarthritis. Patient has very been seen by orthopedics, started on Vicodin and recommended total hip replacement. Patient states pain worsened after being incarcerated, no noted deformities or neuro deficits on exam today.  Patient is already on nonnarcotic pain medications, I have elected not to provide her further narcotics. I have written her for tramadol, however she is unhappy with this, swearing at staff and refusing to sign for discharge papers.  She will FU with her orthopedist as previously scheduled on 04/19/13.  I personally performed the services described in this documentation, which was scribed in my presence. The recorded information has been reviewed and is accurate.    Larene Pickett, PA-C 04/06/13 [phone]

## 2013-04-05 NOTE — ED Notes (Signed)
Pt states she was seen at [facility] recently for the same.  Pt states she was DX with hip bone spurrs, and spent all weekend in jail sleeping on a hard mattress and has aggrivated pain

## 2013-04-05 NOTE — Discharge Instructions (Signed)
Take the prescribed medication as directed. Follow-up with Dr. Berenice Primas as previously scheduled. Return to the ED for new or worsening symptoms.

## 2013-04-06 NOTE — ED Notes (Signed)
Pt became agitated, angry, cursing at staff because she did not get "taken care of, and pain wasn't addressed". Refused to sign the signature pad or get her vitals taken prior to leaving. Pt received a rx for Tramadol and stated, "I'm not getting this shit filled. It won't help my pain. What the Michelle [name] did I come to the ER for if that lazy doctor isn't going to treat my pain?".

## 2013-04-10 NOTE — ED Provider Notes (Signed)
Medical screening examination/treatment/procedure(s) were performed by non-physician practitioner and as supervising physician I was immediately available for consultation/collaboration.   EKG Interpretation None        Houston Siren III, MD 04/10/13 [phone]

## 2013-04-26 ENCOUNTER — Encounter: Payer: Self-pay | Admitting: Internal Medicine

## 2013-05-01 ENCOUNTER — Encounter: Payer: Self-pay | Admitting: Internal Medicine

## 2013-05-08 ENCOUNTER — Encounter (HOSPITAL_COMMUNITY): Payer: Self-pay | Admitting: Emergency Medicine

## 2013-05-08 ENCOUNTER — Emergency Department (INDEPENDENT_AMBULATORY_CARE_PROVIDER_SITE_OTHER)
Admission: EM | Admit: 2013-05-08 | Discharge: 2013-05-08 | Disposition: A | Discharge status: Home / Self Care | Payer: Medicaid Other | Source: Home / Self Care | Attending: Family Medicine | Admitting: Family Medicine

## 2013-05-08 DIAGNOSIS — L409 Psoriasis, unspecified: Secondary | ICD-10-CM

## 2013-05-08 DIAGNOSIS — L408 Other psoriasis: Secondary | ICD-10-CM

## 2013-05-08 MED ORDER — TRIAMCINOLONE ACETONIDE 0.025 % EX OINT: 1.0000 "application " | TOPICAL_OINTMENT | Freq: Two times a day (BID) | CUTANEOUS | Status: DC

## 2013-05-08 MED ORDER — CIPROFLOXACIN-DEXAMETHASONE 0.3-0.1 % OT SUSP: 4.0000 [drp] | Freq: Two times a day (BID) | OTIC | Status: DC

## 2013-05-08 MED ORDER — PREDNISONE 10 MG PO TABS: ORAL_TABLET | ORAL | Status: DC

## 2013-05-08 MED ORDER — HYDROCODONE-ACETAMINOPHEN 5-325 MG PO TABS: 2.0000 | ORAL_TABLET | ORAL | Status: DC | PRN

## 2013-05-08 NOTE — ED Provider Notes (Signed)
Medical screening examination/treatment/procedure(s) were performed by non-physician practitioner and as supervising physician I was immediately available for consultation/collaboration.  Philipp Deputy, M.D.  Harden Mo, MD 05/08/13 2227

## 2013-05-08 NOTE — ED Provider Notes (Signed)
CSN: [id number]     Arrival date & time 05/08/13  1627 History   First MD Initiated Contact with Patient 05/08/13 1712     Chief Complaint  Patient presents with  . Psoriasis   (Consider location/radiation/quality/duration/timing/severity/associated sxs/prior Treatment) Patient is a 48 y.o. female presenting with psoriasis. The history is provided by the patient. No language interpreter was used.  Psoriasis This is a new problem. The current episode started more than 1 week ago. The problem occurs constantly. The problem has been gradually worsening. Pertinent negatives include no shortness of breath. Nothing aggravates the symptoms. Nothing relieves the symptoms. She has tried nothing for the symptoms. The treatment provided no relief.    Past Medical History  Diagnosis Date  . Asthma   . GERD (gastroesophageal reflux disease)   . Peptic ulcer disease 1984  . Episodic tension type headache   . Arthritis     hips, knees and spine  . Anxiety   . Depression   . Carpal tunnel syndrome of left wrist   . Barrett's esophagus   . Gastroparesis   . DM (diabetes mellitus)   . IBS (irritable bowel syndrome)   . Narcotic abuse   . Chronic kidney disease   . Bronchitis    Past Surgical History  Procedure Laterality Date  . Cervical fusion  2000    x2  . Joint replacement  2009    left knee x3  . Back surgery  2008    lumbar  fusion  . Cholecystectomy  1991  . Carpal tunnel release  2011    right hand  . Carpal tunnel release  11/23/2010    Procedure: CARPAL TUNNEL RELEASE;  Surgeon: Meredith Pel;  Location: [place];  Service: Orthopedics;  Laterality: Left;  revision left carpal tunnel release neurolysis of median nerve  . Esophagogastroduodenoscopy  03/20/2011    Procedure: ESOPHAGOGASTRODUODENOSCOPY (EGD);  Surgeon: Irene Shipper, MD;  Location: [facility] ENDOSCOPY;  Service: Endoscopy;  Laterality: N/A;  Clarise Cruz /ja  . Knee arthroscopy      right  . Carpectomy  10/05/2011    Procedure:  CARPECTOMY;  Surgeon: Tennis Must, MD;  Location: [place];  Service: Orthopedics;  Laterality: Left;  left proximal row carpectomy bone grafting capitate and radial styloidectomy  . Tubal ligation     Family History  Problem Relation Age of Onset  . Esophageal cancer Father   . Clotting disorder Daughter   . Colon cancer Neg Hx   . Rectal cancer Neg Hx   . Stomach cancer Neg Hx    History  Substance Use Topics  . Smoking status: Current Every Day Smoker -- 0.50 packs/day for 30 years    Types: Cigarettes    Last Attempt to Quit: 04/28/2011  . Smokeless tobacco: Never Used  . Alcohol Use: No   OB History   Grav Para Term Preterm Abortions TAB SAB Ect Mult Living                 Review of Systems  Respiratory: Negative for shortness of breath.   All other systems reviewed and are negative.   Allergies  Penicillins  Home Medications   Prior to Admission medications   Medication Sig Start Date End Date Taking? Authorizing Provider  DULoxetine (CYMBALTA) 30 MG capsule Take 30 mg by mouth 2 (two) times daily.    Yes Historical Provider, MD  furosemide (LASIX) 20 MG tablet Take 20 mg by mouth daily.  Yes Historical Provider, MD  gabapentin (NEURONTIN) 300 MG capsule Take 600 mg by mouth 2 (two) times daily.    Yes Historical Provider, MD  ibuprofen (ADVIL,MOTRIN) 200 MG tablet Take 400 mg by mouth every 6 (six) hours as needed for mild pain.    Yes Historical Provider, MD  Ipratropium-Albuterol (COMBIVENT RESPIMAT) 20-100 MCG/ACT AERS respimat Inhale 2 puffs into the lungs 3 (three) times daily as needed for wheezing.    Yes Historical Provider, MD  lamoTRIgine (LAMICTAL) 25 MG tablet Take 25 mg by mouth 2 (two) times daily.   Yes Historical Provider, MD  meloxicam (MOBIC) 7.5 MG tablet Take 1 tablet (7.5 mg total) by mouth daily. 03/26/13  Yes Marissa Sciacca, PA-C  omeprazole (PRILOSEC) 40 MG capsule Take 40 mg by mouth daily.   Yes Historical Provider, MD   traMADol (ULTRAM) 50 MG tablet Take 1 tablet (50 mg total) by mouth every 6 (six) hours as needed. 04/05/13  Yes Larene Pickett, PA-C  traZODone (DESYREL) 50 MG tablet Take 100 mg by mouth at bedtime.    Yes Historical Provider, MD   BP 136/84  Pulse 85  Temp(Src) 97.9 F (36.6 C) (Oral)  Resp 20  SpO2 96%  LMP 02/27/2011 Physical Exam  Nursing note and vitals reviewed. Constitutional: She is oriented to person, place, and time. She appears well-developed and well-nourished.  HENT:  Head: Normocephalic and atraumatic.  White exudate ear canals  Eyes: EOM are normal.  Neck: Normal range of motion.  Pulmonary/Chest: Effort normal.  Abdominal: She exhibits no distension.  Musculoskeletal: Normal range of motion.  Neurological: She is alert and oriented to person, place, and time.  Skin: Rash noted.  White plaques joints   Psychiatric: She has a normal mood and affect.    ED Course  Procedures (including critical care time) Labs Review Labs Reviewed - No data to display  Imaging Review No results found.   MDM   1. Psoriasis   triamcinolone Hydrocodone ciprodex for ears Prednisone taper     Fransico Meadow, PA-C 05/08/13 1741

## 2013-05-08 NOTE — ED Notes (Signed)
C/o  Psoriasis in both ears. Elbows, and between breast.   Having a severe flare up for the past 4 months.   States under a lot of stress.

## 2013-05-08 NOTE — Discharge Instructions (Signed)
Psoriasis Psoriasis is a common, long-lasting (chronic) inflammation of the skin. It affects both men and women equally, of all ages and all races. Psoriasis cannot be passed from person to person (not contagious). Psoriasis varies from mild to very severe. When severe, it can greatly affect your quality of life. Psoriasis is an inflammatory disorder affecting the skin as well as other organs including the joints (causing an arthritis). With psoriasis, the skin sheds its top layer of cells more rapidly than it does in someone without psoriasis. CAUSES  The cause of psoriasis is largely unknown. Genetics, your immune system, and the environment seem to play a role in causing psoriasis. Factors that can make psoriasis worse include:  Damage or trauma to the skin, such as cuts, scrapes, and sunburn. This damage often causes new areas of psoriasis (lesions).  Winter dryness and lack of sunlight.  Medicines such as lithium, beta-blockers, antimalarial drugs, ACE inhibitors, nonsteroidal anti-inflammatory drugs (ibuprofen, aspirin), and terbinafine. Let your caregiver know if you are taking any of these drugs.  Alcohol. Excessive alcohol use should be avoided if you have psoriasis. Drinking large amounts of alcohol can affect:  How well your psoriasis treatment works.  How safe your psoriasis treatment is.  Smoking. If you smoke, ask your caregiver for help to quit.  Stress.  Bacterial or viral infections.  Arthritis. Arthritis associated with psoriasis (psoriatic arthritis) affects less than 10% of patients with psoriasis. The arthritic intensity does not always match the skin psoriasis intensity. It is important to let your caregiver know if your joints hurt or if they are stiff. SYMPTOMS  The most common form of psoriasis begins with little red bumps that gradually become larger. The bumps begin to form scales that flake off easily. The lower layers of scales stick together. When these scales  are scratched or removed, the underlying skin is tender and bleeds easily. These areas then grow in size and may become large. Psoriasis often creates a rash that looks the same on both sides of the body (symmetrical). It often affects the elbows, knees, groin, genitals, arms, legs, scalp, and nails. Affected nails often have pitting, loosen, thicken, crumble, and are difficult to treat.  "Inverse psoriasis"occurs in the armpits, under breasts, in skin folds, and around the groin, buttocks, and genitals.  "Guttate psoriasis" generally occurs in children and young adults following a recent sore throat (strep throat). It begins with many small, red, scaly spots on the skin. It clears spontaneously in weeks or a few months without treatment. DIAGNOSIS  Psoriasis is diagnosed by physical exam. A tissue sample (biopsy) may also be taken. TREATMENT The treatment of psoriasis depends on your age, health, and living conditions.  Steroid (cortisone) creams, lotions, and ointments may be used. These treatments are associated with thinning of the skin, blood vessels that get larger (dilated), loss of skin pigmentation, and easy bruising. It is important to use these steroids as directed by your caregiver. Only treat the affected areas and not the normal, unaffected skin. People on long-term steroid treatment should wear a medical alert bracelet. Injections may be used in areas that are difficult to treat.  Scalp treatments are available as shampoos, solutions, sprays, foams, and oils. Avoid scratching the scalp and picking at the scales.  Anthralin medicine works well on areas that are difficult to treat. However, it stains clothes and skin and may cause temporary irritation.  Synthetic vitamin D (calcipotriene)can be used on small areas. It is available by prescription. The forms   of synthetic vitamin D available in health food stores do not help with psoriasis.  Coal tarsare available in various strengths  for psoriasis that is difficult to treat. They are one of the longest used treatments for difficult to treat psoriasis. However, they are messy to use.  Light therapy (UV therapy) can be carefully and professionally monitored in a dermatologist's office. Careful sunbathing is helpful for many people as directed by your caregiver. The exposure should be just long enough to cause a mild redness (erythema) of your skin. Avoid sunburn as this may make the condition worse. Sunscreen (SPF of 30 or higher) should be used to protect against sunburn. Cataracts, wrinkles, and skin aging are some of the harmful side effects of light therapy.  If creams (topical medicines) fail, there are several other options for systemic or oral medicines your caregiver can suggest. Psoriasis can sometimes be very difficult to treat. It can come and go. It is necessary to follow up with your caregiver regularly if your psoriasis is difficult to treat. Usually, with persistence you can get a good amount of relief. Maintaining consistent care is important. Do not change caregivers just because you do not see immediate results. It may take several trials to find the right combination of treatment for you. PREVENTING FLARE-UPS  Wear gloves while you wash dishes, while cleaning, and when you are outside in the cold.  If you have radiators, place a bowl of water or damp towel on the radiator. This will help put water back in the air. You can also use a humidifier to keep the air moist. Try to keep the humidity at about 60% in your home.  Apply moisturizer while your skin is still damp from bathing or showering. This traps water in the skin.  Avoid long, hot baths or showers. Keep soap use to a minimum. Soaps dry out the skin and wash away the protective oils. Use a fragrance free, dye free soap.  Drink enough water and fluids to keep your urine clear or pale yellow. Not drinking enough water depletes your skin's water  supply.  Turn off the heat at night and keep it low during the day. Cool air is less drying. SEEK MEDICAL CARE IF:  You have increasing pain in the affected areas.  You have uncontrolled bleeding in the affected areas.  You have increasing redness or warmth in the affected areas.  You start to have pain or stiffness in your joints.  You start feeling depressed about your condition.  You have a fever. Document Released: 12/26/1999 Document Revised: 03/22/2011 Document Reviewed: 06/22/2010 ExitCare Patient Information 2014 [employer].  

## 2013-05-10 ENCOUNTER — Telehealth: Payer: Self-pay | Admitting: Internal Medicine

## 2013-05-10 MED ORDER — OMEPRAZOLE 40 MG PO CPDR: 40.0000 mg | DELAYED_RELEASE_CAPSULE | Freq: Every day | ORAL | Status: DC

## 2013-05-10 NOTE — Telephone Encounter (Signed)
Pt out of prilosec and needs script. Discussed with pt that I would refill X1 but then she needs an OV. States she will come in as soon as recovered from Hip Surgery.

## 2013-05-18 ENCOUNTER — Encounter: Payer: Self-pay | Admitting: Internal Medicine

## 2013-05-22 ENCOUNTER — Other Ambulatory Visit: Payer: Self-pay | Admitting: Orthopedic Surgery

## 2013-05-24 NOTE — Pre-Procedure Instructions (Signed)
Michelle [name]  05/24/2013   Your procedure is scheduled on:  06/01/2013  FRIDAY  Report to [facility] Admitting at 5:30 AM.  Call this number if you have problems the morning of surgery: [phone]   Remember:   Do not eat food or drink liquids after midnight.  On THURSDAY   Take these medicines the morning of surgery with A SIP OF WATER: Cymbalta, Gabapentin. Lamictal, Prilosec.  Take pain medication if needed.               Use inhaler and bring it to the hospital with you.               Do not wear jewlery, nail polish.  Do not wear lotions, powders, or perfumes. You may wear deodorant.   Do not shave 48 hours prior to surgery.   Do not bring valuables to the hospital.  [facility] is not responsible for any belongings or valuables.               Contacts, dentures or bridgework may not be worn into surgery.   Leave suitcase in the car. After surgery it may be brought to your room.   For patients admitted to the hospital, discharge time is determined by your                treatment team.               Special Instructions: Special Instructions: Day - Preparing for Surgery  Before surgery, you can play an important role.  Because skin is not sterile, your skin needs to be as free of germs as possible.  You can reduce the number of germs on you skin by washing with CHG (chlorahexidine gluconate) soap before surgery.  CHG is an antiseptic cleaner which kills germs and bonds with the skin to continue killing germs even after washing.  Please DO NOT use if you have an allergy to CHG or antibacterial soaps.  If your skin becomes reddened/irritated stop using the CHG and inform your nurse when you arrive at Short Stay.  Do not shave (including legs and underarms) for at least 48 hours prior to the first CHG shower.  You may shave your face.  Please follow these instructions carefully:   1.  Shower with CHG Soap the night before surgery and the  morning of  Surgery.  2.  If you choose to wash your hair, wash your hair first as usual with your  normal shampoo.  3.  After you shampoo, rinse your hair and body thoroughly to remove the  Shampoo.  4.  Use CHG as you would any other liquid soap.  You can apply chg directly to the skin and wash gently with scrungie or a clean washcloth.  5.  Apply the CHG Soap to your body ONLY FROM THE NECK DOWN.    Do not use on open wounds or open sores.  Avoid contact with your eyes, ears, mouth and genitals (private parts).  Wash genitals (private parts)   with your normal soap.  6.  Wash thoroughly, paying special attention to the area where your surgery will be performed.  7.  Thoroughly rinse your body with warm water from the neck down.  8.  DO NOT shower/wash with your normal soap after using and rinsing off   the CHG Soap.  9.  Pat yourself dry with a clean towel.  10.  Wear clean pajamas.            11.  Place clean sheets on your bed the night of your first shower and do not sleep with pets.  Day of Surgery  Do not apply any lotions/deodorants the morning of surgery.  Please wear clean clothes to the hospital/surgery center.   Please read over the following fact sheets that you were given: Pain Booklet, Coughing and Deep Breathing, Blood Transfusion Information, MRSA Information and Surgical Site Infection Prevention

## 2013-05-25 ENCOUNTER — Encounter (HOSPITAL_COMMUNITY): Payer: Self-pay

## 2013-05-25 ENCOUNTER — Ambulatory Visit (HOSPITAL_COMMUNITY)
Admission: RE | Admit: 2013-05-25 | Discharge: 2013-05-25 | Disposition: A | Discharge status: Home / Self Care | Payer: Medicaid Other | Source: Ambulatory Visit | Attending: Orthopedic Surgery | Admitting: Orthopedic Surgery

## 2013-05-25 ENCOUNTER — Encounter (HOSPITAL_COMMUNITY): Payer: Self-pay | Admitting: Pharmacy Technician

## 2013-05-25 ENCOUNTER — Encounter (HOSPITAL_COMMUNITY)
Admission: RE | Admit: 2013-05-25 | Discharge: 2013-05-25 | Disposition: A | Discharge status: Home / Self Care | Payer: Medicaid Other | Source: Ambulatory Visit | Attending: Orthopedic Surgery | Admitting: Orthopedic Surgery

## 2013-05-25 DIAGNOSIS — Z01812 Encounter for preprocedural laboratory examination: Secondary | ICD-10-CM | POA: Insufficient documentation

## 2013-05-25 DIAGNOSIS — J8410 Pulmonary fibrosis, unspecified: Secondary | ICD-10-CM | POA: Insufficient documentation

## 2013-05-25 DIAGNOSIS — Z01818 Encounter for other preprocedural examination: Secondary | ICD-10-CM | POA: Insufficient documentation

## 2013-05-25 HISTORY — DX: Bipolar disorder, unspecified: F31.9

## 2013-05-25 HISTORY — DX: Psoriasis, unspecified: L40.9

## 2013-05-25 LAB — COMPREHENSIVE METABOLIC PANEL
ALT: 14 U/L (ref 0–35)
AST: 14 U/L (ref 0–37)
Albumin: 3.6 g/dL (ref 3.5–5.2)
Alkaline Phosphatase: 120 U/L — ABNORMAL HIGH (ref 39–117)
BUN: 13 mg/dL (ref 6–23)
CO2: 27 mEq/L (ref 19–32)
Calcium: 9.1 mg/dL (ref 8.4–10.5)
Chloride: 101 mEq/L (ref 96–112)
Creatinine, Ser: 1.02 mg/dL (ref 0.50–1.10)
GFR calc Af Amer: 74 mL/min — ABNORMAL LOW (ref >90)
GFR calc non Af Amer: 64 mL/min — ABNORMAL LOW (ref >90)
Glucose, Bld: 95 mg/dL (ref 70–99)
Potassium: 4.4 mEq/L (ref 3.7–5.3)
Sodium: 141 mEq/L (ref 137–147)
Total Bilirubin: 0.3 mg/dL (ref 0.3–1.2)
Total Protein: 7.2 g/dL (ref 6.0–8.3)

## 2013-05-25 LAB — CBC WITH DIFFERENTIAL/PLATELET
Basophils Absolute: 0 10*3/uL (ref 0.0–0.1)
Basophils Relative: 0 % (ref 0–1)
Eosinophils Absolute: 0.2 10*3/uL (ref 0.0–0.7)
Eosinophils Relative: 3 % (ref 0–5)
HCT: 43.8 % (ref 36.0–46.0)
Hemoglobin: 14.4 g/dL (ref 12.0–15.0)
Lymphocytes Relative: 36 % (ref 12–46)
Lymphs Abs: 2 10*3/uL (ref 0.7–4.0)
MCH: 30.2 pg (ref 26.0–34.0)
MCHC: 32.9 g/dL (ref 30.0–36.0)
MCV: 91.8 fL (ref 78.0–100.0)
Monocytes Absolute: 0.4 10*3/uL (ref 0.1–1.0)
Monocytes Relative: 7 % (ref 3–12)
Neutro Abs: 3 10*3/uL (ref 1.7–7.7)
Neutrophils Relative %: 54 % (ref 43–77)
Platelets: 128 10*3/uL — ABNORMAL LOW (ref 150–400)
RBC: 4.77 MIL/uL (ref 3.87–5.11)
RDW: 14.8 % (ref 11.5–15.5)
WBC: 5.6 10*3/uL (ref 4.0–10.5)

## 2013-05-25 LAB — APTT: aPTT: 28 seconds (ref 24–37)

## 2013-05-25 LAB — PROTIME-INR
INR: 0.91 (ref 0.00–1.49)
Prothrombin Time: 12.1 seconds (ref 11.6–15.2)

## 2013-05-25 LAB — TYPE AND SCREEN
ABO/RH(D): O POS
Antibody Screen: NEGATIVE

## 2013-05-25 LAB — URINALYSIS, ROUTINE W REFLEX MICROSCOPIC
Bilirubin Urine: NEGATIVE
Glucose, UA: NEGATIVE mg/dL
Hgb urine dipstick: NEGATIVE
Ketones, ur: NEGATIVE mg/dL
Leukocytes, UA: NEGATIVE
Nitrite: NEGATIVE
Protein, ur: NEGATIVE mg/dL
Specific Gravity, Urine: 1.012 (ref 1.005–1.030)
Urobilinogen, UA: 0.2 mg/dL (ref 0.0–1.0)
pH: 6.5 (ref 5.0–8.0)

## 2013-05-25 LAB — SURGICAL PCR SCREEN
MRSA, PCR: NEGATIVE
Staphylococcus aureus: POSITIVE — AB

## 2013-05-25 NOTE — Pre-Procedure Instructions (Signed)
Michelle [name]  05/25/2013   Your procedure is scheduled on:  06/01/2013  FRIDAY  Report to [facility] Admitting at 5:30 AM.  Call this number if you have problems the morning of surgery: [phone]   Remember:   Do not eat food or drink liquids after midnight.  On THURSDAY   Take these medicines the morning of surgery with A SIP OF WATER: Cymbalta, Gabapentin,  Lamictal, Prilosec.  May use Inhaler and bring to the hospital with you.                Take pain medication if needed.   Do not wear jewelry, make-up or nail polish.   Do not wear lotions, powders, or perfumes. You may wear deodorant.   Do not shave 48 hours prior to surgery.   Do not bring valuables to the hospital.  [facility] is not responsible for any belongings or valuables.               Contacts, dentures or bridgework may not be worn into surgery.   Leave suitcase in the car. After surgery it may be brought to your room.   For patients admitted to the hospital, discharge time is determined by your   treatment team.                             Special Instructions: Special Instructions: [place] - Preparing for Surgery  Before surgery, you can play an important role.  Because skin is not sterile, your skin needs to be as free of germs as possible.  You can reduce the number of germs on you skin by washing with CHG (chlorahexidine gluconate) soap before surgery.  CHG is an antiseptic cleaner which kills germs and bonds with the skin to continue killing germs even after washing.  Please DO NOT use if you have an allergy to CHG or antibacterial soaps.  If your skin becomes reddened/irritated stop using the CHG and inform your nurse when you arrive at Short Stay.  Do not shave (including legs and underarms) for at least 48 hours prior to the first CHG shower.  You may shave your face.  Please follow these instructions carefully:   1.  Shower with CHG Soap the night  before surgery and the  morning of Surgery.  2.  If you choose to wash your hair, wash your hair first as usual with your  normal shampoo.  3.  After you shampoo, rinse your hair and body thoroughly to remove the  Shampoo.  4.  Use CHG as you would any other liquid soap.  You can apply chg directly to the skin and wash gently with scrungie or a clean washcloth.  5.  Apply the CHG Soap to your body ONLY FROM THE NECK DOWN.    Do not use on open wounds or open sores.  Avoid contact with your eyes, ears, mouth and genitals (private parts).  Wash genitals (private parts)   with your normal soap.  6.  Wash thoroughly, paying special attention to the area where your surgery will be performed.  7.  Thoroughly rinse your body with warm water from the neck down.  8.  DO NOT shower/wash with your normal soap after using and rinsing off   the CHG Soap.  9.  Pat yourself dry with a clean towel.            10.  Wear clean pajamas.            11.  Place clean sheets on your bed the night of your first shower and do not sleep with pets.  Day of Surgery  Do not apply any lotions/deodorants the morning of surgery.  Please wear clean clothes to the hospital/surgery center.   Please read over the following fact sheets that you were given: Pain Booklet, Coughing and Deep Breathing, Blood Transfusion Information, MRSA Information and Surgical Site Infection Prevention

## 2013-05-25 NOTE — Progress Notes (Signed)
Michelle [name] reports that her birth date is 10/24/1965, however her Medicaid card and disability cards has [birth year].  Patient reported that she informed admitting.  I notified billing department.  Patient states that she will try to take birth certificatre to [place] office early next week.

## 2013-05-25 NOTE — Pre-Procedure Instructions (Signed)
Michelle [name]  05/25/2013   Your procedure is scheduled on:  Friday, May 22.  Report to [facility] Admitting at 5:30 AM.  Call this number if you have problems the morning of surgery: [phone]   Remember:   Do not eat food or drink liquids after midnight Wednesday, May 21.   Take these medicines the morning of surgery with A SIP OF WATER: DULoxetine (CYMBALTA), lamoTRIgine (LAMICTAL),  omeprazole (PRILOSEC).  Use Inhaler and bring it to the hospital with you.               May take pain medication if needed.            Stop taking Aspirin and Aspirin Products, NSAIDs including Mobic.  Stop all vitamins and Herbal medications.   Do not wear jewelry, make-up or nail polish.  Do not wear lotions, powders, or perfumes.  Do not shave 48 hours prior to surgery.   Do not bring valuables to the hospital.             [facility] is not responsible for any belongings or valuables.               Contacts, dentures or bridgework may not be worn into surgery.  Leave suitcase in the car. After surgery it may be brought to your room.  For patients admitted to the hospital, discharge time is determined by your treatment team.               Special Instructions: Review  [place] - Preparing For Surgery.   Please read over the following fact sheets that you were given: Pain Booklet, Coughing and Deep Breathing, Blood Transfusion Information and Surgical Site Infection Prevention

## 2013-05-25 NOTE — Pre-Procedure Instructions (Signed)
Michelle [name]  05/25/2013   Your procedure is scheduled on:  Friday, May 22.  Report to [facility] Admitting at 5:30 AM.  Call this number if you have problems the morning of surgery: [phone]   Remember:   Do not eat food or drink liquids after midnight Wednesday, May 21.   Take these medicines the morning of surgery with A SIP OF WATER: DULoxetine (CYMBALTA), lamoTRIgine (LAMICTAL),  omeprazole (PRILOSEC).  Use Inhaler and bring it to the hospital with you.               May take pain medication if needed.            Stop taking Aspirin and Aspirin Products, NSAIDs including Mobic.  Stop all vitamins and Herbal medications.   Do not wear jewelry, make-up or nail polish.  Do not wear lotions, powders, or perfumes.  Do not shave 48 hours prior to surgery.   Do not bring valuables to the hospital.             [facility] is not responsible for any belongings or valuables.               Contacts, dentures or bridgework may not be worn into surgery.  Leave suitcase in the car. After surgery it may be brought to your room.  For patients admitted to the hospital, discharge time is determined by your treatment team.               Special Instructions: Review  [place] - Preparing For Surgery.   Please read over the following fact sheets that you were given: Pain Booklet, Coughing and Deep Breathing, Blood Transfusion Information and Surgical Site Infection Prevention  

## 2013-05-25 NOTE — Progress Notes (Deleted)
Creatine 2.92, Potassium 6.4- slightly hemolysised , "per instrument, per Louie Casa in chemistry..  Patient denied any renal disease. I notified [place], [place].  Ebony Hail has notes from patients PCP, Dr Laurance Flatten, but no labs.  I called Dr Otho Ket office and notified Tiffany , who will notify Dr Louanne Skye and she will call me back.

## 2013-05-28 NOTE — Progress Notes (Signed)
Call to [facility] at [place], asked for Rx to be filled for Mupirocin

## 2013-05-28 NOTE — Progress Notes (Signed)
Pt. Contacted & told of swab result & instructed to report to pharmacy  To pick up Mupirocin .

## 2013-05-31 MED ORDER — CHLORHEXIDINE GLUCONATE 4 % EX LIQD
60.0000 mL | Freq: Once | CUTANEOUS | Status: DC
  Filled 2013-05-31: qty 60

## 2013-05-31 MED ORDER — CLINDAMYCIN PHOSPHATE 900 MG/50ML IV SOLN
900.0000 mg | INTRAVENOUS | Status: AC
  Administered 2013-06-01: 900 mg via INTRAVENOUS
  Filled 2013-05-31: qty 50

## 2013-06-01 ENCOUNTER — Encounter (HOSPITAL_COMMUNITY)
Admission: RE | Disposition: A | Discharge status: Home Health | Payer: Self-pay | Source: Ambulatory Visit | Attending: Orthopedic Surgery

## 2013-06-01 ENCOUNTER — Encounter (HOSPITAL_COMMUNITY): Payer: Medicaid Other | Admitting: Vascular Surgery

## 2013-06-01 ENCOUNTER — Inpatient Hospital Stay (HOSPITAL_COMMUNITY): Payer: Medicaid Other

## 2013-06-01 ENCOUNTER — Encounter (HOSPITAL_COMMUNITY): Payer: Self-pay | Admitting: *Deleted

## 2013-06-01 ENCOUNTER — Inpatient Hospital Stay (HOSPITAL_COMMUNITY)
Admission: RE | Admit: 2013-06-01 | Discharge: 2013-06-03 | DRG: 470 | Disposition: A | Discharge status: Home Health | Payer: Medicaid Other | Source: Ambulatory Visit | Attending: Orthopedic Surgery | Admitting: Orthopedic Surgery

## 2013-06-01 ENCOUNTER — Inpatient Hospital Stay (HOSPITAL_COMMUNITY): Payer: Medicaid Other | Admitting: Certified Registered Nurse Anesthetist

## 2013-06-01 DIAGNOSIS — Z880 Allergy status to penicillin: Secondary | ICD-10-CM

## 2013-06-01 DIAGNOSIS — F1910 Other psychoactive substance abuse, uncomplicated: Secondary | ICD-10-CM | POA: Diagnosis present

## 2013-06-01 DIAGNOSIS — M87059 Idiopathic aseptic necrosis of unspecified femur: Secondary | ICD-10-CM | POA: Diagnosis present

## 2013-06-01 DIAGNOSIS — Z800 Family history of malignant neoplasm of digestive organs: Secondary | ICD-10-CM

## 2013-06-01 DIAGNOSIS — J45909 Unspecified asthma, uncomplicated: Secondary | ICD-10-CM | POA: Diagnosis present

## 2013-06-01 DIAGNOSIS — D696 Thrombocytopenia, unspecified: Secondary | ICD-10-CM | POA: Diagnosis present

## 2013-06-01 DIAGNOSIS — Z981 Arthrodesis status: Secondary | ICD-10-CM

## 2013-06-01 DIAGNOSIS — M1610 Unilateral primary osteoarthritis, unspecified hip: Principal | ICD-10-CM | POA: Diagnosis present

## 2013-06-01 DIAGNOSIS — F411 Generalized anxiety disorder: Secondary | ICD-10-CM | POA: Diagnosis present

## 2013-06-01 DIAGNOSIS — E119 Type 2 diabetes mellitus without complications: Secondary | ICD-10-CM | POA: Diagnosis present

## 2013-06-01 DIAGNOSIS — M169 Osteoarthritis of hip, unspecified: Principal | ICD-10-CM | POA: Diagnosis present

## 2013-06-01 DIAGNOSIS — Z8711 Personal history of peptic ulcer disease: Secondary | ICD-10-CM

## 2013-06-01 DIAGNOSIS — K3184 Gastroparesis: Secondary | ICD-10-CM | POA: Diagnosis present

## 2013-06-01 DIAGNOSIS — K219 Gastro-esophageal reflux disease without esophagitis: Secondary | ICD-10-CM | POA: Diagnosis present

## 2013-06-01 DIAGNOSIS — F17200 Nicotine dependence, unspecified, uncomplicated: Secondary | ICD-10-CM | POA: Diagnosis present

## 2013-06-01 DIAGNOSIS — F319 Bipolar disorder, unspecified: Secondary | ICD-10-CM | POA: Diagnosis present

## 2013-06-01 DIAGNOSIS — Q6589 Other specified congenital deformities of hip: Secondary | ICD-10-CM

## 2013-06-01 DIAGNOSIS — L408 Other psoriasis: Secondary | ICD-10-CM | POA: Diagnosis present

## 2013-06-01 DIAGNOSIS — M1611 Unilateral primary osteoarthritis, right hip: Secondary | ICD-10-CM | POA: Diagnosis present

## 2013-06-01 DIAGNOSIS — N189 Chronic kidney disease, unspecified: Secondary | ICD-10-CM | POA: Diagnosis present

## 2013-06-01 DIAGNOSIS — M84453A Pathological fracture, unspecified femur, initial encounter for fracture: Secondary | ICD-10-CM | POA: Diagnosis not present

## 2013-06-01 DIAGNOSIS — Z6841 Body Mass Index (BMI) 40.0 and over, adult: Secondary | ICD-10-CM

## 2013-06-01 HISTORY — PX: TOTAL HIP ARTHROPLASTY: SHX124

## 2013-06-01 LAB — CBC
HCT: 35.4 % — ABNORMAL LOW (ref 36.0–46.0)
Hemoglobin: 11.5 g/dL — ABNORMAL LOW (ref 12.0–15.0)
MCH: 29.9 pg (ref 26.0–34.0)
MCHC: 32.5 g/dL (ref 30.0–36.0)
MCV: 92.2 fL (ref 78.0–100.0)
Platelets: DECREASED 10*3/uL (ref 150–400)
RBC: 3.84 MIL/uL — ABNORMAL LOW (ref 3.87–5.11)
RDW: 14.7 % (ref 11.5–15.5)
WBC: 6.1 10*3/uL (ref 4.0–10.5)

## 2013-06-01 LAB — GLUCOSE, CAPILLARY
Glucose-Capillary: 127 mg/dL — ABNORMAL HIGH (ref 70–99)
Glucose-Capillary: 170 mg/dL — ABNORMAL HIGH (ref 70–99)
Glucose-Capillary: 205 mg/dL — ABNORMAL HIGH (ref 70–99)
Glucose-Capillary: 218 mg/dL — ABNORMAL HIGH (ref 70–99)

## 2013-06-01 SURGERY — ARTHROPLASTY, HIP, TOTAL,POSTERIOR APPROACH
Anesthesia: General | Site: Hip | Laterality: Right

## 2013-06-01 MED ORDER — DEXAMETHASONE SODIUM PHOSPHATE 10 MG/ML IJ SOLN
10.0000 mg | Freq: Three times a day (TID) | INTRAMUSCULAR | Status: AC
  Administered 2013-06-01 – 2013-06-02 (×2): 10 mg via INTRAVENOUS
  Filled 2013-06-01 (×3): qty 1

## 2013-06-01 MED ORDER — ONDANSETRON HCL 4 MG/2ML IJ SOLN
INTRAMUSCULAR | Status: AC
  Filled 2013-06-01: qty 2

## 2013-06-01 MED ORDER — HYDROMORPHONE HCL PF 1 MG/ML IJ SOLN
INTRAMUSCULAR | Status: AC
  Filled 2013-06-01: qty 2

## 2013-06-01 MED ORDER — METHOCARBAMOL 1000 MG/10ML IJ SOLN
500.0000 mg | INTRAMUSCULAR | Status: AC
  Administered 2013-06-01: 500 mg via INTRAVENOUS
  Filled 2013-06-01: qty 5

## 2013-06-01 MED ORDER — GLYCOPYRROLATE 0.2 MG/ML IJ SOLN
INTRAMUSCULAR | Status: AC
  Filled 2013-06-01: qty 4

## 2013-06-01 MED ORDER — DOCUSATE SODIUM 100 MG PO CAPS
100.0000 mg | ORAL_CAPSULE | Freq: Two times a day (BID) | ORAL | Status: DC
  Administered 2013-06-01 – 2013-06-03 (×4): 100 mg via ORAL
  Filled 2013-06-01 (×6): qty 1

## 2013-06-01 MED ORDER — NEOSTIGMINE METHYLSULFATE 10 MG/10ML IV SOLN
INTRAVENOUS | Status: AC
  Filled 2013-06-01: qty 2

## 2013-06-01 MED ORDER — KETOROLAC TROMETHAMINE 15 MG/ML IJ SOLN
INTRAMUSCULAR | Status: AC
  Filled 2013-06-01: qty 1

## 2013-06-01 MED ORDER — BUPIVACAINE-EPINEPHRINE 0.5% -1:200000 IJ SOLN
INTRAMUSCULAR | Status: DC | PRN
  Administered 2013-06-01: 30 mL

## 2013-06-01 MED ORDER — EPHEDRINE SULFATE 50 MG/ML IJ SOLN
INTRAMUSCULAR | Status: AC
  Filled 2013-06-01: qty 1

## 2013-06-01 MED ORDER — PROPOFOL 10 MG/ML IV BOLUS
INTRAVENOUS | Status: DC | PRN
  Administered 2013-06-01: 150 mg via INTRAVENOUS

## 2013-06-01 MED ORDER — METHOCARBAMOL 1000 MG/10ML IJ SOLN
500.0000 mg | Freq: Four times a day (QID) | INTRAVENOUS | Status: DC | PRN
  Filled 2013-06-01: qty 5

## 2013-06-01 MED ORDER — HYDROMORPHONE HCL PF 1 MG/ML IJ SOLN
0.2500 mg | INTRAMUSCULAR | Status: DC | PRN
  Administered 2013-06-01 (×4): 0.5 mg via INTRAVENOUS

## 2013-06-01 MED ORDER — TRANEXAMIC ACID 100 MG/ML IV SOLN
1000.0000 mg | INTRAVENOUS | Status: DC
  Filled 2013-06-01: qty 10

## 2013-06-01 MED ORDER — SUCCINYLCHOLINE CHLORIDE 20 MG/ML IJ SOLN
INTRAMUSCULAR | Status: AC
  Filled 2013-06-01: qty 1

## 2013-06-01 MED ORDER — HYDROMORPHONE HCL PF 1 MG/ML IJ SOLN
INTRAMUSCULAR | Status: AC
  Filled 2013-06-01: qty 1

## 2013-06-01 MED ORDER — ONDANSETRON HCL 4 MG/2ML IJ SOLN: 4.0000 mg | Freq: Once | INTRAMUSCULAR | Status: DC | PRN

## 2013-06-01 MED ORDER — IPRATROPIUM-ALBUTEROL 0.5-2.5 (3) MG/3ML IN SOLN: 3.0000 mL | Freq: Three times a day (TID) | RESPIRATORY_TRACT | Status: DC | PRN

## 2013-06-01 MED ORDER — ROCURONIUM BROMIDE 50 MG/5ML IV SOLN
INTRAVENOUS | Status: AC
  Filled 2013-06-01: qty 1

## 2013-06-01 MED ORDER — BISACODYL 5 MG PO TBEC: 5.0000 mg | DELAYED_RELEASE_TABLET | Freq: Every day | ORAL | Status: DC | PRN

## 2013-06-01 MED ORDER — OXYCODONE HCL 5 MG/5ML PO SOLN: 5.0000 mg | Freq: Once | ORAL | Status: DC | PRN

## 2013-06-01 MED ORDER — ALUM & MAG HYDROXIDE-SIMETH 200-200-20 MG/5ML PO SUSP: 30.0000 mL | ORAL | Status: DC | PRN

## 2013-06-01 MED ORDER — ROCURONIUM BROMIDE 100 MG/10ML IV SOLN
INTRAVENOUS | Status: DC | PRN
  Administered 2013-06-01: 50 mg via INTRAVENOUS

## 2013-06-01 MED ORDER — CLINDAMYCIN PHOSPHATE 600 MG/50ML IV SOLN
600.0000 mg | Freq: Four times a day (QID) | INTRAVENOUS | Status: AC
  Administered 2013-06-01 – 2013-06-02 (×2): 600 mg via INTRAVENOUS
  Filled 2013-06-01 (×2): qty 50

## 2013-06-01 MED ORDER — OXYCODONE-ACETAMINOPHEN 5-325 MG PO TABS
1.0000 | ORAL_TABLET | ORAL | Status: DC | PRN
  Administered 2013-06-01 – 2013-06-03 (×10): 2 via ORAL
  Filled 2013-06-01 (×9): qty 2

## 2013-06-01 MED ORDER — ASPIRIN EC 325 MG PO TBEC
325.0000 mg | DELAYED_RELEASE_TABLET | Freq: Two times a day (BID) | ORAL | Status: DC
  Administered 2013-06-02 – 2013-06-03 (×3): 325 mg via ORAL
  Filled 2013-06-01 (×6): qty 1

## 2013-06-01 MED ORDER — ONDANSETRON HCL 4 MG/2ML IJ SOLN
INTRAMUSCULAR | Status: DC | PRN
  Administered 2013-06-01: 4 mg via INTRAVENOUS

## 2013-06-01 MED ORDER — OXYCODONE HCL 5 MG PO TABS: 5.0000 mg | ORAL_TABLET | Freq: Once | ORAL | Status: DC | PRN

## 2013-06-01 MED ORDER — 0.9 % SODIUM CHLORIDE (POUR BTL) OPTIME
TOPICAL | Status: DC | PRN
  Administered 2013-06-01: 1000 mL

## 2013-06-01 MED ORDER — DULOXETINE HCL 30 MG PO CPEP
30.0000 mg | ORAL_CAPSULE | Freq: Two times a day (BID) | ORAL | Status: DC
  Administered 2013-06-01 – 2013-06-03 (×4): 30 mg via ORAL
  Filled 2013-06-01 (×6): qty 1

## 2013-06-01 MED ORDER — FENTANYL CITRATE 0.05 MG/ML IJ SOLN
INTRAMUSCULAR | Status: AC
  Filled 2013-06-01: qty 5

## 2013-06-01 MED ORDER — LABETALOL HCL 5 MG/ML IV SOLN
INTRAVENOUS | Status: AC
  Filled 2013-06-01: qty 4

## 2013-06-01 MED ORDER — FENTANYL CITRATE 0.05 MG/ML IJ SOLN
INTRAMUSCULAR | Status: DC | PRN
  Administered 2013-06-01 (×2): 50 ug via INTRAVENOUS
  Administered 2013-06-01 (×2): 100 ug via INTRAVENOUS
  Administered 2013-06-01 (×4): 50 ug via INTRAVENOUS

## 2013-06-01 MED ORDER — TRANEXAMIC ACID 100 MG/ML IV SOLN
1000.0000 mg | INTRAVENOUS | Status: DC | PRN
  Administered 2013-06-01: 1000 mg via INTRAVENOUS

## 2013-06-01 MED ORDER — SODIUM CHLORIDE 0.9 % IV SOLN
INTRAVENOUS | Status: DC
  Administered 2013-06-02: 05:00:00 via INTRAVENOUS

## 2013-06-01 MED ORDER — TRAZODONE HCL 100 MG PO TABS
100.0000 mg | ORAL_TABLET | Freq: Every day | ORAL | Status: DC
  Administered 2013-06-01 – 2013-06-02 (×2): 100 mg via ORAL
  Filled 2013-06-01 (×3): qty 1

## 2013-06-01 MED ORDER — OXYCODONE-ACETAMINOPHEN 5-325 MG PO TABS: 1.0000 | ORAL_TABLET | ORAL | Status: DC | PRN

## 2013-06-01 MED ORDER — LACTATED RINGERS IV SOLN
INTRAVENOUS | Status: DC | PRN
  Administered 2013-06-01 (×3): via INTRAVENOUS

## 2013-06-01 MED ORDER — TRIAMCINOLONE ACETONIDE 0.025 % EX CREA
1.0000 "application " | TOPICAL_CREAM | Freq: Two times a day (BID) | CUTANEOUS | Status: DC
  Administered 2013-06-01 – 2013-06-03 (×4): 1 via TOPICAL
  Filled 2013-06-01: qty 15

## 2013-06-01 MED ORDER — LABETALOL HCL 5 MG/ML IV SOLN
INTRAVENOUS | Status: DC | PRN
  Administered 2013-06-01 (×3): 5 mg via INTRAVENOUS

## 2013-06-01 MED ORDER — KETOROLAC TROMETHAMINE 15 MG/ML IJ SOLN
15.0000 mg | Freq: Four times a day (QID) | INTRAMUSCULAR | Status: AC
  Administered 2013-06-01 – 2013-06-02 (×4): 15 mg via INTRAVENOUS
  Filled 2013-06-01 (×3): qty 1

## 2013-06-01 MED ORDER — OXYCODONE-ACETAMINOPHEN 5-325 MG PO TABS
ORAL_TABLET | ORAL | Status: AC
  Filled 2013-06-01: qty 2

## 2013-06-01 MED ORDER — DEXAMETHASONE 6 MG PO TABS
10.0000 mg | ORAL_TABLET | Freq: Three times a day (TID) | ORAL | Status: AC
  Administered 2013-06-01: 10 mg via ORAL
  Filled 2013-06-01 (×3): qty 1

## 2013-06-01 MED ORDER — GABAPENTIN 300 MG PO CAPS
600.0000 mg | ORAL_CAPSULE | Freq: Two times a day (BID) | ORAL | Status: DC
  Administered 2013-06-01 – 2013-06-03 (×4): 600 mg via ORAL
  Filled 2013-06-01 (×6): qty 2

## 2013-06-01 MED ORDER — GLYCOPYRROLATE 0.2 MG/ML IJ SOLN
INTRAMUSCULAR | Status: DC | PRN
  Administered 2013-06-01: 0.6 mg via INTRAVENOUS

## 2013-06-01 MED ORDER — MIDAZOLAM HCL 5 MG/5ML IJ SOLN
INTRAMUSCULAR | Status: DC | PRN
  Administered 2013-06-01: 2 mg via INTRAVENOUS

## 2013-06-01 MED ORDER — LIDOCAINE HCL (CARDIAC) 20 MG/ML IV SOLN
INTRAVENOUS | Status: AC
  Filled 2013-06-01: qty 5

## 2013-06-01 MED ORDER — METHOCARBAMOL 500 MG PO TABS
500.0000 mg | ORAL_TABLET | Freq: Four times a day (QID) | ORAL | Status: DC | PRN
  Administered 2013-06-01 – 2013-06-03 (×8): 500 mg via ORAL
  Filled 2013-06-01 (×8): qty 1

## 2013-06-01 MED ORDER — HYDROMORPHONE HCL PF 1 MG/ML IJ SOLN
1.0000 mg | INTRAMUSCULAR | Status: DC | PRN
  Administered 2013-06-01 – 2013-06-03 (×12): 2 mg via INTRAVENOUS
  Filled 2013-06-01 (×11): qty 2

## 2013-06-01 MED ORDER — ONDANSETRON HCL 4 MG/2ML IJ SOLN: 4.0000 mg | Freq: Four times a day (QID) | INTRAMUSCULAR | Status: DC | PRN

## 2013-06-01 MED ORDER — STERILE WATER FOR INJECTION IJ SOLN
INTRAMUSCULAR | Status: AC
  Filled 2013-06-01: qty 10

## 2013-06-01 MED ORDER — MIDAZOLAM HCL 2 MG/2ML IJ SOLN
INTRAMUSCULAR | Status: AC
  Filled 2013-06-01: qty 2

## 2013-06-01 MED ORDER — NEOSTIGMINE METHYLSULFATE 10 MG/10ML IV SOLN
INTRAVENOUS | Status: DC | PRN
  Administered 2013-06-01: 4 mg via INTRAVENOUS

## 2013-06-01 MED ORDER — ACETAMINOPHEN 650 MG RE SUPP: 650.0000 mg | Freq: Four times a day (QID) | RECTAL | Status: DC | PRN

## 2013-06-01 MED ORDER — PROPOFOL 10 MG/ML IV BOLUS
INTRAVENOUS | Status: AC
  Filled 2013-06-01: qty 20

## 2013-06-01 MED ORDER — METHOCARBAMOL 750 MG PO TABS: 750.0000 mg | ORAL_TABLET | Freq: Three times a day (TID) | ORAL | Status: DC | PRN

## 2013-06-01 MED ORDER — PHENOL 1.4 % MT LIQD: 1.0000 | OROMUCOSAL | Status: DC | PRN

## 2013-06-01 MED ORDER — LAMOTRIGINE 25 MG PO TABS
25.0000 mg | ORAL_TABLET | Freq: Two times a day (BID) | ORAL | Status: DC
  Administered 2013-06-01 – 2013-06-03 (×4): 25 mg via ORAL
  Filled 2013-06-01 (×6): qty 1

## 2013-06-01 MED ORDER — ASPIRIN EC 325 MG PO TBEC: 325.0000 mg | DELAYED_RELEASE_TABLET | Freq: Two times a day (BID) | ORAL | Status: DC

## 2013-06-01 MED ORDER — ONDANSETRON HCL 4 MG PO TABS: 4.0000 mg | ORAL_TABLET | Freq: Four times a day (QID) | ORAL | Status: DC | PRN

## 2013-06-01 MED ORDER — LIDOCAINE HCL (CARDIAC) 20 MG/ML IV SOLN
INTRAVENOUS | Status: DC | PRN
  Administered 2013-06-01: 80 mg via INTRAVENOUS

## 2013-06-01 MED ORDER — BUPIVACAINE-EPINEPHRINE (PF) 0.5% -1:200000 IJ SOLN
INTRAMUSCULAR | Status: AC
  Filled 2013-06-01: qty 30

## 2013-06-01 MED ORDER — CIPROFLOXACIN-DEXAMETHASONE 0.3-0.1 % OT SUSP
4.0000 [drp] | Freq: Two times a day (BID) | OTIC | Status: DC
  Administered 2013-06-01 – 2013-06-03 (×4): 4 [drp] via OTIC
  Filled 2013-06-01: qty 7.5

## 2013-06-01 MED ORDER — PANTOPRAZOLE SODIUM 40 MG PO TBEC
80.0000 mg | DELAYED_RELEASE_TABLET | Freq: Every day | ORAL | Status: DC
  Administered 2013-06-02: 80 mg via ORAL
  Filled 2013-06-01: qty 2

## 2013-06-01 MED ORDER — FUROSEMIDE 20 MG PO TABS
20.0000 mg | ORAL_TABLET | Freq: Every day | ORAL | Status: DC
  Filled 2013-06-01 (×4): qty 1

## 2013-06-01 MED ORDER — IPRATROPIUM-ALBUTEROL 20-100 MCG/ACT IN AERS: 2.0000 | INHALATION_SPRAY | Freq: Three times a day (TID) | RESPIRATORY_TRACT | Status: DC | PRN

## 2013-06-01 MED ORDER — ACETAMINOPHEN 325 MG PO TABS: 650.0000 mg | ORAL_TABLET | Freq: Four times a day (QID) | ORAL | Status: DC | PRN

## 2013-06-01 MED ORDER — ALBUMIN HUMAN 5 % IV SOLN
INTRAVENOUS | Status: DC | PRN
  Administered 2013-06-01: 09:00:00 via INTRAVENOUS

## 2013-06-01 MED ORDER — POLYETHYLENE GLYCOL 3350 17 G PO PACK: 17.0000 g | PACK | Freq: Every day | ORAL | Status: DC | PRN

## 2013-06-01 MED ORDER — MENTHOL 3 MG MT LOZG: 1.0000 | LOZENGE | OROMUCOSAL | Status: DC | PRN

## 2013-06-01 SURGICAL SUPPLY — 60 items
BLADE SAW SAG 73X25 THK (BLADE) ×1
BLADE SAW SGTL 73X25 THK (BLADE) ×1 IMPLANT
BRUSH FEMORAL CANAL (MISCELLANEOUS) IMPLANT
CANISTER SUCT 3000ML (MISCELLANEOUS) ×2 IMPLANT
CAPT HIP PF COP ×2 IMPLANT
COVER BACK TABLE 24X17X13 BIG (DRAPES) IMPLANT
COVER SURGICAL LIGHT HANDLE (MISCELLANEOUS) ×2 IMPLANT
DRAPE INCISE IOBAN 66X45 STRL (DRAPES) ×2 IMPLANT
DRAPE ORTHO SPLIT 77X108 STRL (DRAPES) ×2
DRAPE PROXIMA HALF (DRAPES) ×2 IMPLANT
DRAPE SURG ORHT 6 SPLT 77X108 (DRAPES) ×2 IMPLANT
DRAPE U-SHAPE 47X51 STRL (DRAPES) ×2 IMPLANT
DRILL BIT 7/64X5 (BIT) ×2 IMPLANT
DRSG MEPILEX BORDER 4X12 (GAUZE/BANDAGES/DRESSINGS) ×2 IMPLANT
DURAPREP 26ML APPLICATOR (WOUND CARE) ×2 IMPLANT
ELECT BLADE 6.5 EXT (BLADE) ×2 IMPLANT
ELECT CAUTERY BLADE 6.4 (BLADE) ×2 IMPLANT
ELECT REM PT RETURN 9FT ADLT (ELECTROSURGICAL) ×2
ELECTRODE REM PT RTRN 9FT ADLT (ELECTROSURGICAL) ×1 IMPLANT
EVACUATOR 1/8 PVC DRAIN (DRAIN) IMPLANT
GLOVE BIOGEL PI IND STRL 8 (GLOVE) ×2 IMPLANT
GLOVE BIOGEL PI INDICATOR 8 (GLOVE) ×2
GLOVE ECLIPSE 7.5 STRL STRAW (GLOVE) ×4 IMPLANT
GOWN STRL REUS W/ TWL LRG LVL3 (GOWN DISPOSABLE) ×1 IMPLANT
GOWN STRL REUS W/ TWL XL LVL3 (GOWN DISPOSABLE) ×2 IMPLANT
GOWN STRL REUS W/TWL LRG LVL3 (GOWN DISPOSABLE) ×1
GOWN STRL REUS W/TWL XL LVL3 (GOWN DISPOSABLE) ×2
HOOD PEEL AWAY FACE SHEILD DIS (HOOD) ×4 IMPLANT
IMMOBILIZER KNEE 20 (SOFTGOODS) IMPLANT
IMMOBILIZER KNEE 22 UNIV (SOFTGOODS) IMPLANT
IMMOBILIZER KNEE 24 THIGH 36 (MISCELLANEOUS) IMPLANT
IMMOBILIZER KNEE 24 UNIV (MISCELLANEOUS)
KIT BASIN OR (CUSTOM PROCEDURE TRAY) ×2 IMPLANT
KIT ROOM TURNOVER OR (KITS) ×2 IMPLANT
MANIFOLD NEPTUNE II (INSTRUMENTS) IMPLANT
NEEDLE 1/2 CIR MAYO (NEEDLE) ×2 IMPLANT
NEEDLE 22X1 1/2 (OR ONLY) (NEEDLE) ×2 IMPLANT
NS IRRIG 1000ML POUR BTL (IV SOLUTION) ×2 IMPLANT
PACK TOTAL JOINT (CUSTOM PROCEDURE TRAY) ×2 IMPLANT
PAD ARMBOARD 7.5X6 YLW CONV (MISCELLANEOUS) ×4 IMPLANT
PASSER SUT SWANSON 36MM LOOP (INSTRUMENTS) IMPLANT
PRESSURIZER FEMORAL UNIV (MISCELLANEOUS) IMPLANT
STAPLER VISISTAT 35W (STAPLE) ×2 IMPLANT
SUCTION FRAZIER TIP 10 FR DISP (SUCTIONS) IMPLANT
SUT ETHIBOND 2 V 37 (SUTURE) ×2 IMPLANT
SUT VIC AB 0 CT1 27 (SUTURE) ×1
SUT VIC AB 0 CT1 27XBRD ANBCTR (SUTURE) ×1 IMPLANT
SUT VIC AB 0 CTXB 36 (SUTURE) ×4 IMPLANT
SUT VIC AB 1 CT1 27 (SUTURE) ×1
SUT VIC AB 1 CT1 27XBRD ANBCTR (SUTURE) ×1 IMPLANT
SUT VIC AB 1 CTX 36 (SUTURE) ×2
SUT VIC AB 1 CTX36XBRD ANBCTR (SUTURE) ×2 IMPLANT
SUT VIC AB 2-0 CTB1 (SUTURE) ×4 IMPLANT
SYR CONTROL 10ML LL (SYRINGE) ×2 IMPLANT
TOWEL OR 17X24 6PK STRL BLUE (TOWEL DISPOSABLE) ×2 IMPLANT
TOWEL OR 17X26 10 PK STRL BLUE (TOWEL DISPOSABLE) ×2 IMPLANT
TOWER CARTRIDGE SMART MIX (DISPOSABLE) IMPLANT
TRAY FOLEY CATH 14FR (SET/KITS/TRAYS/PACK) ×2 IMPLANT
TRAY FOLEY CATH 16FRSI W/METER (SET/KITS/TRAYS/PACK) IMPLANT
WATER STERILE IRR 1000ML POUR (IV SOLUTION) IMPLANT

## 2013-06-01 NOTE — Progress Notes (Signed)
Utilization review completed.  

## 2013-06-01 NOTE — Anesthesia Preprocedure Evaluation (Addendum)
Anesthesia Evaluation  Patient identified by MRN, date of birth, ID band Patient awake    Reviewed: Allergy & Precautions, H&P , NPO status , Patient's Chart, lab work & pertinent test results  Airway Mallampati: II TM Distance: >3 FB Neck ROM: Full    Dental  (+) Dental Advisory Given, Partial Lower, Teeth Intact, Missing   Pulmonary asthma , Current Smoker,  breath sounds clear to auscultation        Cardiovascular Rhythm:Regular Rate:Normal     Neuro/Psych PSYCHIATRIC DISORDERS Anxiety Depression Bipolar Disorder    GI/Hepatic PUD, GERD-  Medicated,(+)     substance abuse  ,   Endo/Other  diabetes, Type 2  Renal/GU      Musculoskeletal  (+) Arthritis -,   Abdominal   Peds  Hematology   Anesthesia Other Findings   Reproductive/Obstetrics                        Anesthesia Physical Anesthesia Plan  ASA: III  Anesthesia Plan: General   Post-op Pain Management:    Induction: Intravenous  Airway Management Planned: Oral ETT  Additional Equipment:   Intra-op Plan:   Post-operative Plan: Extubation in OR  Informed Consent: I have reviewed the patients History and Physical, chart, labs and discussed the procedure including the risks, benefits and alternatives for the proposed anesthesia with the patient or authorized representative who has indicated his/her understanding and acceptance.   Dental advisory given  Plan Discussed with: CRNA, Anesthesiologist and Surgeon  Anesthesia Plan Comments:         Anesthesia Quick Evaluation

## 2013-06-01 NOTE — Progress Notes (Signed)
[facility]  Patient Status: New  [facility] is providing the following services: PT - referral from MD office . Thank you  If patient discharges after hours, please call [phone].   Pearletha Forge 06/01/2013, 2:26 PM

## 2013-06-01 NOTE — Discharge Instructions (Signed)
Total Hip Replacement °Care After °Refer to this sheet in the next few weeks. These instructions provide you with information on caring for yourself after your procedure. Your caregiver also may give you specific instructions. Your treatment has been planned according to the most current medical practices, but problems sometimes occur. Call your caregiver if you have any problems or questions after your procedure. °HOME CARE INSTRUCTIONS  °Your caregiver will give you specific precautions for certain types of movement. Additional instructions include: °· Take over-the-counter or prescription medicines for pain, discomfort, or fever only as directed by your caregiver. °· Take quick showers (3 5 min) rather than bathe until your caregiver tells you that you can take baths again. °· Avoid lifting until your caregiver instructs you otherwise. °· Use a raised toilet seat and avoid sitting in low chairs as instructed by your caregiver. °· Use crutches or a walker as instructed by your caregiver. °SEEK MEDICAL CARE IF: °· You have difficulty breathing. °· Your wound is red, swollen, or has become increasingly painful. °· You have pus draining from your wound. °· You have a bad smell coming from your wound. °· You have persistent bleeding from your wound. °· Your wound breaks open after sutures (stitches) or staples have been removed. °SEEK IMMEDIATE MEDICAL CARE IF:  °· You have a fever. °· You have a rash. °· You have pain or swelling in your calf or thigh. °· You have shortness of breath or chest pain. °MAKE SURE YOU: °· Understand these instructions. °· Will watch your condition. °· Will get help right away if you are not doing well or get worse. °Document Released: 07/17/2004 Document Revised: 06/29/2011 Document Reviewed: 02/14/2011 °Exit[employer]® Patient Information ©2014 [employer]. ° °

## 2013-06-01 NOTE — Anesthesia Procedure Notes (Signed)
Procedure Name: Intubation Date/Time: 06/01/2013 7:42 AM Performed by: Jacob Moores Pre-anesthesia Checklist: Patient identified, Emergency Drugs available, Suction available and Patient being monitored Patient Re-evaluated:Patient Re-evaluated prior to inductionOxygen Delivery Method: Circle system utilized Preoxygenation: Pre-oxygenation with 100% oxygen Intubation Type: IV induction Ventilation: Mask ventilation without difficulty and Oral airway inserted - appropriate to patient size Laryngoscope Size: Sabra Heck and 2 Grade View: Grade III Tube type: Oral Tube size: 7.5 mm Number of attempts: 1 Airway Equipment and Method: Stylet and Oral airway Placement Confirmation: ETT inserted through vocal cords under direct vision,  positive ETCO2 and breath sounds checked- equal and bilateral Secured at: 22 cm Tube secured with: Tape Dental Injury: Teeth and Oropharynx as per pre-operative assessment

## 2013-06-01 NOTE — Evaluation (Signed)
Physical Therapy Evaluation Patient Details Name: Michelle [name] MRN: [id number] DOB: 08/14/1965 Today's Date: 06/01/2013   History of Present Illness  s/p Rt posterior THA  06/01/13.   Clinical Impression  Pt is s/p RtTHA POD#0 resulting in the deficits listed below (see PT Problem List). Pt will benefit from skilled PT to increase their independence and safety with mobility to allow discharge to the venue listed below. Pt limited in mobility today due to pain and was very lethargic. Pt may need wheelchair upon D/C for increased mobility due to TDWB status on Rt LE. Will cont to follow to assess appropriate needs for D/C.      Follow Up Recommendations Home health PT;Supervision/Assistance - 24 hour    Equipment Recommendations  Rolling walker with 5" wheels;3in1 (PT);Wheelchair (measurements PT);Wheelchair cushion (measurements PT)    Recommendations for Other Services OT consult     Precautions / Restrictions Precautions Precautions: Posterior Hip;Fall Precaution Booklet Issued: Yes (comment) Precaution Comments: pt given handout and pt and family reviewed thoroughly  Required Braces or Orthoses: Knee Immobilizer - Right Knee Immobilizer - Right: Other (comment) (no MD orders to specifi; KI on upon entering room ) Restrictions Weight Bearing Restrictions: Yes RLE Weight Bearing: Touchdown weight bearing      Mobility  Bed Mobility               General bed mobility comments: pt sitting EOB upon entering room   Transfers Overall transfer level: Needs assistance Equipment used: Rolling walker (2 wheeled) Transfers: Sit to/from [facility] Sit to Stand: Min assist;From elevated surface Stand pivot transfers: Min assist;From elevated surface       General transfer comment: min (A) to (A) bringing pt up to upright position; (A) during SPT to maintain TDWB status; cues for hand placement and sequencing with RW   Ambulation/Gait                 Stairs            Wheelchair Mobility    Modified Rankin (Stroke Patients Only)       Balance Overall balance assessment: Needs assistance Sitting-balance support: Feet supported;No upper extremity supported Sitting balance-Leahy Scale: Good Sitting balance - Comments: tolerated sitting EOB ~5 min while PT in room; no c/o dizziness   Standing balance support: During functional activity;Bilateral upper extremity supported Standing balance-Leahy Scale: Poor Standing balance comment: requires UEs supported by RW                             Pertinent Vitals/Pain 10/10 "at times" pt falling asleep mid conversation in chair.     Home Living Family/patient expects to be discharged to:: Private residence Living Arrangements: Parent;Children Available Help at Discharge: Family;Available 24 hours/day Type of Home: House Home Access: Stairs to enter Entrance Stairs-Rails: None Entrance Stairs-Number of Steps: 3 Home Layout: One level Home Equipment: None Additional Comments: pt has tub shower    Prior Function Level of Independence: Independent               Hand Dominance   Dominant Hand: Right    Extremity/Trunk Assessment   Upper Extremity Assessment: Defer to OT evaluation           Lower Extremity Assessment: RLE deficits/detail      Cervical / Trunk Assessment: Normal  Communication   Communication: No difficulties  Cognition Arousal/Alertness: Lethargic;Suspect due to medications Behavior During Therapy: [facility] for tasks  assessed/performed Overall Cognitive Status: Within Functional Limits for tasks assessed                      General Comments      Exercises Total Joint Exercises Ankle Circles/Pumps: AROM;Strengthening;Both;10 reps;Seated      Assessment/Plan    PT Assessment Patient needs continued PT services  PT Diagnosis Difficulty walking;Generalized weakness;Acute pain   PT Problem List Decreased  strength;Decreased range of motion;Decreased activity tolerance;Decreased balance;Decreased mobility;Decreased knowledge of use of DME;Decreased safety awareness;Decreased knowledge of precautions;Pain;Impaired sensation  PT Treatment Interventions DME instruction;Stair training;Gait training;Functional mobility training;Therapeutic activities;Therapeutic exercise;Balance training;Neuromuscular re-education;Patient/family education   PT Goals (Current goals can be found in the Care Plan section) Acute Rehab PT Goals Patient Stated Goal: to go home sunday PT Goal Formulation: With patient Time For Goal Achievement: 06/08/13 Potential to Achieve Goals: Good    Frequency 7X/week   Barriers to discharge        Co-evaluation               End of Session Equipment Utilized During Treatment: Right knee immobilizer;Gait belt;Oxygen Activity Tolerance: Patient limited by lethargy;Patient limited by pain Patient left: in chair;with call bell/phone within reach;with family/visitor present Nurse Communication: Patient requests pain meds;Weight bearing status;Precautions;Mobility status         Time: 9476-5465 PT Time Calculation (min): 20 min   Charges:   PT Evaluation $Initial PT Evaluation Tier I: 1 Procedure PT Treatments $Therapeutic Activity: 8-22 mins   PT G CodesKennis Carina [place], [place]  [phone] 06/01/2013, 4:45 PM

## 2013-06-01 NOTE — Anesthesia Postprocedure Evaluation (Signed)
  Anesthesia Post-op Note  Patient: Michelle [name]  Procedure(s) Performed: Procedure(s): POSTERIOR RIGHT TOTAL HIP ARTHROPLASTY (Right)  Patient Location: PACU  Anesthesia Type:General  Level of Consciousness: awake, alert  and oriented  Airway and Oxygen Therapy: Patient Spontanous Breathing and Patient connected to nasal cannula oxygen  Post-op Pain: moderate  Post-op Assessment: Post-op Vital signs reviewed  Post-op Vital Signs: Reviewed  Last Vitals:  Filed Vitals:   06/01/13 1042  BP:   Pulse:   Temp: 36.5 C  Resp:     Complications: No apparent anesthesia complications

## 2013-06-01 NOTE — Transfer of Care (Signed)
Immediate Anesthesia Transfer of Care Note  Patient: Michelle [name]  Procedure(s) Performed: Procedure(s): POSTERIOR RIGHT TOTAL HIP ARTHROPLASTY (Right)  Patient Location: PACU  Anesthesia Type:General  Level of Consciousness: awake, alert  and oriented  Airway & Oxygen Therapy: Patient Spontanous Breathing and Patient connected to nasal cannula oxygen  Post-op Assessment: Report given to PACU RN, Post -op Vital signs reviewed and stable and Patient moving all extremities X 4  Post vital signs: Reviewed and stable  Complications: No apparent anesthesia complications

## 2013-06-01 NOTE — H&P (Signed)
TOTAL HIP ADMISSION H&P  Patient is admitted for right total hip arthroplasty.  Subjective:  Chief Complaint: right hip pain  HPI: Michelle [name], 48 y.o. female, has a history of pain and functional disability in the right hip(s) due to arthritis and patient has failed non-surgical conservative treatments for greater than 12 weeks to include NSAID's and/or analgesics, supervised PT with diminished ADL's post treatment, use of assistive devices and activity modification.  Onset of symptoms was abrupt starting 1 years ago with rapidlly worsening course since that time.The patient noted no past surgery on the right hip(s).  Patient currently rates pain in the right hip at 10 out of 10 with activity. Patient has night pain, worsening of pain with activity and weight bearing, trendelenberg gait, pain that interfers with activities of daily living, pain with passive range of motion, crepitus and joint swelling. Patient has evidence of subchondral cysts, subchondral sclerosis and joint subluxation by imaging studies. This condition presents safety issues increasing the risk of falls. This patient has had avascular necrosis of the hip, acetabular fracture, hip dysplasia.  There is no current active infection.  Patient Active Problem List   Diagnosis Date Noted  . Narcotic abuse   . Barrett esophagus 03/20/2011  . Esophageal reflux 03/20/2011  . Gastroparesis 03/20/2011  . Abdominal pain, epigastric 03/19/2011  . Nausea and vomiting 03/18/2011  . Diarrhea 03/18/2011  . ARF (acute renal failure) 03/18/2011  . ONYCHOMYCOSIS, BILATERAL 08/22/2009  . PSORIASIS 08/22/2009  . CARPAL TUNNEL SYNDROME, BILATERAL 05/28/2009  . DIABETES MELLITUS, TYPE II, CONTROLLED 04/30/2009  . HYPERGLYCEMIA 04/25/2009  . BRONCHITIS, CHRONIC 04/08/2009  . PERIPHERAL EDEMA 04/08/2009  . TOBACCO ABUSE 02/18/2009  . MENOPAUSAL SYNDROME 02/18/2009  . PRURITUS, EARS 02/18/2009  . OBESITY 07/26/2008  . NECK PAIN  04/26/2008  . ACUTE BRONCHITIS 10/23/2007  . DEGENERATIVE JOINT DISEASE, LEFT KNEE 07/20/2007  . INTERTRIGO 04/26/2007  . ANXIETY 01/29/2007  . DEPRESSION 01/29/2007  . CANDIDIASIS, SKIN 01/27/2007  . [place] DISEASE, LUMBOSACRAL SPINE 12/05/2006   Past Medical History  Diagnosis Date  . Asthma   . GERD (gastroesophageal reflux disease)   . Peptic ulcer disease 1984  . Episodic tension type headache   . Arthritis     hips, knees and spine  . Depression   . Carpal tunnel syndrome of left wrist   . Barrett's esophagus   . Gastroparesis   . IBS (irritable bowel syndrome)   . Narcotic abuse   . Bronchitis   . DM (diabetes mellitus)     diet controlled  . Anxiety     panic attacts  . Bipolar disorder   . Chronic kidney disease     does not see a nephrologist  . Psoriasis     Past Surgical History  Procedure Laterality Date  . Cervical fusion  2000    x2  . Joint replacement  2009    left   . Back surgery  2008    lumbar  fusion  . Cholecystectomy  1991  . Carpal tunnel release  2011    right hand  . Carpal tunnel release  11/23/2010    Procedure: CARPAL TUNNEL RELEASE;  Surgeon: Meredith Pel;  Location: [facility];  Service: Orthopedics;  Laterality: Left;  revision left carpal tunnel release neurolysis of median nerve  . Esophagogastroduodenoscopy  03/20/2011    Procedure: ESOPHAGOGASTRODUODENOSCOPY (EGD);  Surgeon: Irene Shipper, MD;  Location[facility] ENDOSCOPY;  Service: Endoscopy;  Laterality: N/A;  Clarise Cruz /ja  .  Knee arthroscopy Bilateral   . Carpectomy  10/05/2011    Procedure: CARPECTOMY;  Surgeon: Tennis Must, MD;  Location: [facility];  Service: Orthopedics;  Laterality: Left;  left proximal row carpectomy bone grafting capitate and radial styloidectomy  . Tubal ligation      Prescriptions prior to admission  Medication Sig Dispense Refill  . ciprofloxacin-dexamethasone (CIPRODEX) otic suspension Place 4 drops into both ears 2 (two) times daily.      .  DULoxetine (CYMBALTA) 30 MG capsule Take 30 mg by mouth 2 (two) times daily.       . furosemide (LASIX) 20 MG tablet Take 20 mg by mouth daily.      Marland Kitchen gabapentin (NEURONTIN) 300 MG capsule Take 600 mg by mouth 2 (two) times daily.       Marland Kitchen HYDROcodone-acetaminophen (NORCO/VICODIN) 5-325 MG per tablet Take 2 tablets by mouth every 4 (four) hours as needed for moderate pain.  20 tablet  0  . Ipratropium-Albuterol (COMBIVENT RESPIMAT) 20-100 MCG/ACT AERS respimat Inhale 2 puffs into the lungs 3 (three) times daily as needed for wheezing.       . lamoTRIgine (LAMICTAL) 25 MG tablet Take 25 mg by mouth 2 (two) times daily.      Marland Kitchen omeprazole (PRILOSEC) 40 MG capsule Take 1 capsule (40 mg total) by mouth daily.  30 capsule  0  . traZODone (DESYREL) 50 MG tablet Take 100 mg by mouth at bedtime.       . triamcinolone (KENALOG) 0.025 % ointment Apply 1 application topically 2 (two) times daily.  454 g  0   Allergies  Allergen Reactions  . Penicillins Itching, Nausea And Vomiting and Rash    History  Substance Use Topics  . Smoking status: Current Every Day Smoker -- 0.50 packs/day for 32 years    Types: Cigarettes  . Smokeless tobacco: Never Used  . Alcohol Use: No    Family History  Problem Relation Age of Onset  . Esophageal cancer Father   . Clotting disorder Daughter   . Colon cancer Neg Hx   . Rectal cancer Neg Hx   . Stomach cancer Neg Hx      ROS ROS: I have reviewed the patient's review of systems thoroughly and there are no positive responses as relates to the HPI. Objective:  Physical Exam  Vital signs in last 24 hours: Temp:  [97 F (36.1 C)] 97 F (36.1 C) (05/22 9242) Pulse Rate:  [64] 64 (05/22 0638) Resp:  [16] 16 (05/22 0638) BP: (144)/(82) 144/82 mmHg (05/22 0638) SpO2:  [95 %] 95 % (05/22 6834) Well-developed well-nourished patient in no acute distress. Alert and oriented x3 HEENT:within normal limits Cardiac: Regular rate and rhythm Pulmonary: Lungs clear to  auscultation Abdomen: Soft and nontender.  Normal active bowel sounds  Musculoskeletal: (right hip: Pain with all range of motion.  Severely limited internal rotation.  2+ distal pulses. Labs: Recent Results (from the past 2160 hour(s))  SURGICAL PCR SCREEN     Status: Abnormal   Collection Time    05/25/13  4:16 PM      Result Value Ref Range   MRSA, PCR NEGATIVE  NEGATIVE   Staphylococcus aureus POSITIVE (*) NEGATIVE   Comment:            The Xpert SA Assay (FDA     approved for NASAL specimens     in patients over 44 years of age),     is one component of  a comprehensive surveillance     program.  Test performance has     been validated by [facility] for patients greater     than or equal to 74 year old.     It is not intended     to diagnose infection nor to     guide or monitor treatment.  APTT     Status: None   Collection Time    05/25/13  4:16 PM      Result Value Ref Range   aPTT 28  24 - 37 seconds  CBC WITH DIFFERENTIAL     Status: Abnormal   Collection Time    05/25/13  4:16 PM      Result Value Ref Range   WBC 5.6  4.0 - 10.5 K/uL   RBC 4.77  3.87 - 5.11 MIL/uL   Hemoglobin 14.4  12.0 - 15.0 g/dL   HCT 43.8  36.0 - 46.0 %   MCV 91.8  78.0 - 100.0 fL   MCH 30.2  26.0 - 34.0 pg   MCHC 32.9  30.0 - 36.0 g/dL   RDW 14.8  11.5 - 15.5 %   Platelets 128 (*) 150 - 400 K/uL   Neutrophils Relative % 54  43 - 77 %   Neutro Abs 3.0  1.7 - 7.7 K/uL   Lymphocytes Relative 36  12 - 46 %   Lymphs Abs 2.0  0.7 - 4.0 K/uL   Monocytes Relative 7  3 - 12 %   Monocytes Absolute 0.4  0.1 - 1.0 K/uL   Eosinophils Relative 3  0 - 5 %   Eosinophils Absolute 0.2  0.0 - 0.7 K/uL   Basophils Relative 0  0 - 1 %   Basophils Absolute 0.0  0.0 - 0.1 K/uL  COMPREHENSIVE METABOLIC PANEL     Status: Abnormal   Collection Time    05/25/13  4:16 PM      Result Value Ref Range   Sodium 141  137 - 147 mEq/L   Potassium 4.4  3.7 - 5.3 mEq/L   Chloride 101  96 - 112 mEq/L    CO2 27  19 - 32 mEq/L   Glucose, Bld 95  70 - 99 mg/dL   BUN 13  6 - 23 mg/dL   Creatinine, Ser 1.02  0.50 - 1.10 mg/dL   Calcium 9.1  8.4 - 10.5 mg/dL   Total Protein 7.2  6.0 - 8.3 g/dL   Albumin 3.6  3.5 - 5.2 g/dL   AST 14  0 - 37 U/L   ALT 14  0 - 35 U/L   Alkaline Phosphatase 120 (*) 39 - 117 U/L   Total Bilirubin 0.3  0.3 - 1.2 mg/dL   GFR calc non Af Amer 64 (*) >90 mL/min   GFR calc Af Amer 74 (*) >90 mL/min   Comment: (NOTE)     The eGFR has been calculated using the CKD EPI equation.     This calculation has not been validated in all clinical situations.     eGFR's persistently <90 mL/min signify possible Chronic Kidney     Disease.  PROTIME-INR     Status: None   Collection Time    05/25/13  4:16 PM      Result Value Ref Range   Prothrombin Time 12.1  11.6 - 15.2 seconds   INR 0.91  0.00 - 1.49  URINALYSIS, ROUTINE W REFLEX MICROSCOPIC  Status: None   Collection Time    05/25/13  4:16 PM      Result Value Ref Range   Color, Urine YELLOW  YELLOW   APPearance CLEAR  CLEAR   Specific Gravity, Urine 1.012  1.005 - 1.030   pH 6.5  5.0 - 8.0   Glucose, UA NEGATIVE  NEGATIVE mg/dL   Hgb urine dipstick NEGATIVE  NEGATIVE   Bilirubin Urine NEGATIVE  NEGATIVE   Ketones, ur NEGATIVE  NEGATIVE mg/dL   Protein, ur NEGATIVE  NEGATIVE mg/dL   Urobilinogen, UA 0.2  0.0 - 1.0 mg/dL   Nitrite NEGATIVE  NEGATIVE   Leukocytes, UA NEGATIVE  NEGATIVE   Comment: MICROSCOPIC NOT DONE ON URINES WITH NEGATIVE PROTEIN, BLOOD, LEUKOCYTES, NITRITE, OR GLUCOSE <1000 mg/dL.  TYPE AND SCREEN     Status: None   Collection Time    05/25/13  4:30 PM      Result Value Ref Range   ABO/RH(D) O POS     Antibody Screen NEG     Sample Expiration 06/08/2013      Estimated body mass index is 42.81 kg/(m^2) as calculated from the following:   Height as of 05/25/13: '5\' 9"'  (1.753 m).   Weight as of 04/05/13: 290 lb (131.543 kg).   Imaging Review Plain radiographs demonstrate moderate  degenerative joint disease of the right hip(s). The bone quality appears to be fair for age and reported activity level.  Assessment/Plan:  End stage arthritis, right hip(s)  The patient history, physical examination, clinical judgement of the provider and imaging studies are consistent with end stage degenerative joint disease of the right hip(s) and total hip arthroplasty is deemed medically necessary. The treatment options including medical management, injection therapy, arthroscopy and arthroplasty were discussed at length. The risks and benefits of total hip arthroplasty were presented and reviewed. The risks due to aseptic loosening, infection, stiffness, dislocation/subluxation,  thromboembolic complications and other imponderables were discussed.  The patient acknowledged the explanation, agreed to proceed with the plan and consent was signed. Patient is being admitted for inpatient treatment for surgery, pain control, PT, OT, prophylactic antibiotics, VTE prophylaxis, progressive ambulation and ADL's and discharge planning.The patient is planning to be discharged home with home health services

## 2013-06-01 NOTE — Brief Op Note (Signed)
06/01/2013  3:19 PM  PATIENT:  Michelle [name]  48 y.o. female  PRE-OPERATIVE DIAGNOSIS:  OSTEOARTHRITIS, AVASCULAR NECROSIS  POST-OPERATIVE DIAGNOSIS:  OSTEOARTHRITIS, AVASCULAR NECROSIS  PROCEDURE:  Procedure(s): POSTERIOR RIGHT TOTAL HIP ARTHROPLASTY (Right)  SURGEON:  Surgeon(s) and Role:    * Alta Corning, MD - Primary  PHYSICIAN ASSISTANT:   ASSISTANTS: bethune    ANESTHESIA:   general  EBL:  Total I/O In: 2900 [I.V.:2400; IV Piggyback:500] Out: 1210 [Urine:110; Blood:1100]  BLOOD ADMINISTERED:none  DRAINS: none   LOCAL MEDICATIONS USED:  NONE  SPECIMEN:  No Specimen  DISPOSITION OF SPECIMEN:  N/A  COUNTS:  YES  TOURNIQUET:  * No tourniquets in log *  DICTATION: .Other Dictation: Dictation Number [id number]  PLAN OF CARE: Admit to inpatient   PATIENT DISPOSITION:  PACU - hemodynamically stable.   Delay start of Pharmacological VTE agent (>24hrs) due to surgical blood loss or risk of bleeding: no

## 2013-06-02 LAB — CBC
HCT: 35.5 % — ABNORMAL LOW (ref 36.0–46.0)
Hemoglobin: 11.5 g/dL — ABNORMAL LOW (ref 12.0–15.0)
MCH: 30.3 pg (ref 26.0–34.0)
MCHC: 32.4 g/dL (ref 30.0–36.0)
MCV: 93.7 fL (ref 78.0–100.0)
Platelets: 104 10*3/uL — ABNORMAL LOW (ref 150–400)
RBC: 3.79 MIL/uL — ABNORMAL LOW (ref 3.87–5.11)
RDW: 14.6 % (ref 11.5–15.5)
WBC: 6.8 10*3/uL (ref 4.0–10.5)

## 2013-06-02 LAB — GLUCOSE, CAPILLARY: Glucose-Capillary: 168 mg/dL — ABNORMAL HIGH (ref 70–99)

## 2013-06-02 LAB — BASIC METABOLIC PANEL
BUN: 12 mg/dL (ref 6–23)
CO2: 27 mEq/L (ref 19–32)
Calcium: 8.4 mg/dL (ref 8.4–10.5)
Chloride: 101 mEq/L (ref 96–112)
Creatinine, Ser: 1.02 mg/dL (ref 0.50–1.10)
GFR calc Af Amer: 74 mL/min — ABNORMAL LOW (ref >90)
GFR calc non Af Amer: 64 mL/min — ABNORMAL LOW (ref >90)
Glucose, Bld: 220 mg/dL — ABNORMAL HIGH (ref 70–99)
Potassium: 4.6 mEq/L (ref 3.7–5.3)
Sodium: 139 mEq/L (ref 137–147)

## 2013-06-02 MED ORDER — PANTOPRAZOLE SODIUM 40 MG PO TBEC
40.0000 mg | DELAYED_RELEASE_TABLET | Freq: Once | ORAL | Status: AC
  Administered 2013-06-02: 40 mg via ORAL
  Filled 2013-06-02: qty 1

## 2013-06-02 MED ORDER — PANTOPRAZOLE SODIUM 40 MG PO TBEC
40.0000 mg | DELAYED_RELEASE_TABLET | Freq: Two times a day (BID) | ORAL | Status: DC
  Administered 2013-06-03: 40 mg via ORAL
  Filled 2013-06-02: qty 1

## 2013-06-02 NOTE — Op Note (Signed)
NAME:  DENISIA, HARPOLE NO.:  [id number]  MEDICAL RECORD NO.:  [id number]  LOCATION:  5N19C                        FACILITY:  [facility]  PHYSICIAN:  Alta Corning, M.D.   DATE OF BIRTH:  [birth year]  DATE OF PROCEDURE: DATE OF DISCHARGE:                              OPERATIVE REPORT   PREOPERATIVE DIAGNOSIS: 1. 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 2. Proximal femur fracture, intraoperative.  POSTOPERATIVE DIAGNOSIS: 1. End-stage degenerative joint disease, right hip, with known     avascular necrosis and collapse with unusual hip geometry. 2. Proximal femur fracture, intraoperative.  PROCEDURE:  Right total hip replacement with a S-ROM system.  The stem is a size 22 x 17 with a cone 22D small cone, the cup is a 54-mm pinnacle cup, porous coated.  The stem is in 20deg retroversion to the cone. Liner is a +4 neutral liner, and the ball was a 36-mm delta ceramic hip ball.  The 2nd surgical procedures can be open reduction and internal fixation of proximal femur fracture with 2 Dall-Miles cables.  SURGEON:  Alta Corning, M.D.  ASSISTANT:  Gary Fleet, PA-C  ANESTHESIA:  General.  BRIEF HISTORY:  Ms. 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  An x-ray showed collapse.  MRI showed severe degenerative change with collapse of an avascular head.  We talked about treatment options.  Preoperatively, the patient was noted to have about 160-degree neck shaft angle and we felt that this would be not appropriate to be addressed from an anterior approach as we were uncertain if this a version issue or just a straight angled issue and because of this, we felt that a posterior approach to the hip would give [place] better opportunity to size and address the femur and this was chosen to be the preoperative procedure.  PROCEDURE IN  DETAIL:  The patient was taken to the operating room after adequate anesthesia was obtained with general anesthetic.  The patient was placed supine on the operating table and moved to the left lateral decubitus position.  All bony prominences were well padded.  Attention was then turned to the right hip and because of her large size, a the tremendous incision needed to be made.  Subcutaneous tissues at the level of the tensor fascia which was probably 8 inches into the hip. The tensor fascia was then divided and the Charnley retractor with deep release was put in place.  Hip was internally rotated and the short external rotators and piriformis were taken down as a group and tagged. Attention was then turned to the hip, which was dislocated and we put a suture to mark the absolute length of the hip.  At this point, a provisional neck cut was made, it was very high under the neck, but we knew that was going to be necessary based on preoperative templating. At this point, attention was turned towards the acetabulum, which was sequentially reamed up to a level of 53 and 54-mm pinnacle cup was put in with a 45 degrees of lateral opening and 30 degrees  of anteversion and trial liner neutral was placed at this point.  Attention was then turned to the stem, which was opened with a cookie cutter and a canal finder and this was sequentially reamed to a level of 17-mm, took a 17.5 three quarters of the way down, and then put a conical reamer in, which was an 22D reamer and then we brought in the spout reamer, her calcar was an extremely anteverted position as we had anticipated preoperatively.  We had a very difficult time getting her spout into the anteverted position because of her large size and even with retractors and a large incision was difficulty to try and get this back into the appropriate location and once we were finally able to get her into this location, we were able to ream the spout  and really could get it all the way down even to the small area and it was mostly was because of the length neck cut, which need to be made to achieve the length given that we did not have the angle of her anatomy.  Once this was done, the trial 22D small cone was put in place with a 22 x 17 stem and we put initially 20 degrees of retroversion on the stem.  This gave [place] excellent stability and range of motion.  At this point, I felt this was going to be the appropriate choice for her.  We then removed all the trial components, and at this point took out the neutral liner, and put a hole eliminator and a standard liner and hammered this into place.  Then turned back to the stem put the 22D small cone and with hammering of this cone into place.  Unfortunately, the neck portion cracked and we track this down,  it did not go below the lesser trochanter, but we went and took a Dall-Miles cable out and did an open reduction and internal fixation, and we were able to crank this down quite nicely across this area, and then went ahead below the lesser trochanter and put in a 2nd cable just to make certain there was no propagation of this crack.  Once these cables were placed and crimped, attention was turned back towards placement of the stem.  A 22 x 17 stem was then placed in 20 degrees of retroversion.  Once this was completed, the +0 ball was trialed with a trial reduction, excellent stability, and range of motion were achieved at this point.  The final delta ceramic 36 mm hip ball was opened and this was placed and then the hip was reduced again and put through the range of motion in excellent stability.  No tendency towards subluxation or dislocation.  At this point, attention was turned towards short external rotators piriformis, which were repaired to the posterior intertrochanteric line through drill holes. The wound was copiously and thoroughly irrigated and suctioned dry.  The large  almost 2 foot incision was closed with deep sutures and staples for the skin.  The patient at this point was taken to the recovery room where she was noted to be in satisfactory condition.  The estimated blood loss for procedure was 1100 mL.     Alta Corning, M.D.     Corliss Skains  D:  06/01/2013  T:  06/02/2013  Job:  [id number]

## 2013-06-02 NOTE — Progress Notes (Signed)
Physical Therapy Treatment Patient Details Name: KEYAIRRA KOLINSKI MRN: [id number] DOB: 1965/08/26 Today's Date: 06/02/2013    History of Present Illness s/p Rt posterior THA  06/01/13. intraoperative proximal femur fracture    PT Comments    Pt making very good progress, although is likely doing more than TDWB on RLE despite frequent cueing.  Pt states she anticipates DC to home tomorrow with help from daughter.    Follow Up Recommendations  Home health PT;Supervision/Assistance - 24 hour     Equipment Recommendations  Rolling walker with 5" wheels;3in1 (PT);Wheelchair (measurements PT);Wheelchair cushion (measurements PT)    Recommendations for Other Services       Precautions / Restrictions Precautions Precautions: Posterior Hip;Fall Precaution Booklet Issued: Yes (comment) Precaution Comments: pt unable to recall hip precautions at start of PT session Restrictions Weight Bearing Restrictions: Yes RLE Weight Bearing: Touchdown weight bearing    Mobility  Bed Mobility               General bed mobility comments: pt up in recliner when PT arrived  Transfers Overall transfer level: Needs assistance Equipment used: Rolling walker (2 wheeled) Transfers: Sit to/from Stand Sit to Stand: Min guard         General transfer comment: cues for hand placement and to not weight bear through RLE  Ambulation/Gait Ambulation/Gait assistance: Min assist Ambulation Distance (Feet): 60 Feet Assistive device: Rolling walker (2 wheeled) (wide RW) Gait Pattern/deviations: Step-to pattern;Decreased stride length     General Gait Details: pt likely putting too much weight through RLE. Pt given frequent VCs not to weightbear through RLE.    Stairs            Wheelchair Mobility    Modified Rankin (Stroke Patients Only)       Balance                                    Cognition Arousal/Alertness: Awake/alert Behavior During Therapy: WFL for  tasks assessed/performed Overall Cognitive Status: Within Functional Limits for tasks assessed                      Exercises Total Joint Exercises Ankle Circles/Pumps: AROM;Strengthening;Both;10 reps;Seated Hip ABduction/ADduction: AAROM;Right;10 reps;Seated (in recliner) Long Arc Quad: AROM;Right;10 reps;Seated    General Comments General comments (skin integrity, edema, etc.): pt much more alert this AM. Cooperative and motivated to increase activity.       Pertinent Vitals/Pain 9/10 r hip pain after session.  Has had all pain meds she could have. Positioned comfortably at end of session.     Home Living                      Prior Function            PT Goals (current goals can now be found in the care plan section) Progress towards PT goals: Progressing toward goals    Frequency       PT Plan Current plan remains appropriate    Co-evaluation             End of Session Equipment Utilized During Treatment: Gait belt Activity Tolerance: Patient tolerated treatment well Patient left: in chair;with call bell/phone within reach     Time:  -     Charges:  $Gait Training: 8-22 mins $Therapeutic Exercise: 8-22 mins  G CodesVincent Peyer [facility] Jun 29, 2013, 10:58 AM Lavonia Dana, PT  [phone] Jun 29, 2013

## 2013-06-02 NOTE — Progress Notes (Signed)
Pt continues to progress with mobility and still with difficulty maintaining TDWB. Will attempt stairs in AM.  06/02/13 1512  PT Visit Information  Last PT Received On 06/02/13  Assistance Needed +1  History of Present Illness s/p Rt posterior THA  06/01/13. intraoperative proximal femur fracture  PT Time Calculation  PT Start Time 1435  PT Stop Time 1507  PT Time Calculation (min) 32 min  Precautions  Precautions Posterior Hip;Fall  Precaution Comments able to state 2/3 hip precautions  Restrictions  Weight Bearing Restrictions Yes  RLE Weight Bearing TWB  Cognition  Arousal/Alertness Awake/alert  Behavior During Therapy WFL for tasks assessed/performed  Overall Cognitive Status Within Functional Limits for tasks assessed  Bed Mobility  Overal bed mobility Needs Assistance  Bed Mobility Supine to Sit;Sit to Supine  Supine to sit Modified independent (Device/Increase time) (used bed rail and HOB elevated)  Sit to supine Min assist  General bed mobility comments assist with RLE.  Pt has purchased a used hospital bed for home  Transfers  Overall transfer level Needs assistance  Equipment used Rolling walker (2 wheeled)  Transfers Sit to/from Stand  Sit to Stand Min guard  General transfer comment Pt did better with placing rle in front and not weightbearing through it with transfers  Ambulation/Gait  Ambulation/Gait assistance Min guard  Ambulation Distance (Feet) 80 Feet  Assistive device Rolling walker (2 wheeled)  Gait Pattern/deviations Step-to pattern;Decreased stride length  Gait velocity decreased  General Gait Details continue to cue pt not to weightbear through RLE. Pt states it is difficult to put weight through her LUE.    General Comments  General comments (skin integrity, edema, etc.) pts mother and daughter present for session with pts permission.    Exercises  Exercises Total Joint  Total Joint Exercises  Ankle Circles/Pumps AROM;Strengthening;Both;5  reps;Supine  Quad Sets AROM;Right;5 reps;Supine  Short Arc Quad AROM;Right;10 reps;Supine  Heel Slides AAROM;Right;10 reps;Supine  Hip ABduction/ADduction AAROM;Right;10 reps;Supine  Long Arc Quad AROM;Right;10 reps;Seated  PT - End of Session  Equipment Utilized During Treatment Gait belt  Activity Tolerance Patient tolerated treatment well  Patient left in bed;with call bell/phone within reach;with family/visitor present  Nurse Communication Mobility status  PT - Assessment/Plan  PT Plan Current plan remains appropriate  Follow Up Recommendations Home health PT;Supervision/Assistance - 24 hour  PT equipment Rolling walker with 5" wheels;3in1 (PT)  PT Goal Progression  Progress towards PT goals Progressing toward goals  PT General Charges  $$ ACUTE PT VISIT 1 Procedure  PT Treatments  $Gait Training 8-22 mins  $Therapeutic Exercise 8-22 mins  [place], [place]  [phone] 06/02/2013

## 2013-06-02 NOTE — Evaluation (Signed)
Occupational Therapy Evaluation Patient Details Name: FAY BAGG MRN: [id number] DOB: 1965/10/27 Today's Date: 06/02/2013    History of Present Illness s/p Rt posterior THA  06/01/13. intraoperative proximal femur fracture   Clinical Impression   Pt admitted with the above diagnoses and presents with below problem list. Pt will benefit from continued acute OT to address the below listed deficits and maximize independence with basic ADLs prior to d/c home with family. Education on techniques and AE for safe completion of ADLs with post hip precautions. Cues needed for WB status during in-room ambulation. Pt limited by pain this session.    Follow Up Recommendations  Supervision/Assistance - 24 hour;No OT follow up    Equipment Recommendations  3 in 1 bedside comode;Other (comment) (reacher)    Recommendations for Other Services       Precautions / Restrictions Precautions Precautions: Posterior Hip;Fall Precaution Booklet Issued: Yes (comment) Precaution Comments: pt able to state 1/3 hip precautions Restrictions Weight Bearing Restrictions: Yes RLE Weight Bearing: Touchdown weight bearing      Mobility Bed Mobility Overal bed mobility: Needs Assistance Bed Mobility: Sit to Supine       Sit to supine: Min assist   General bed mobility comments: assist to RLE onto bed.  Transfers Overall transfer level: Needs assistance Equipment used: Rolling walker (2 wheeled) Transfers: Sit to/from Stand Sit to Stand: Min guard         General transfer comment: cues for TWB status    Balance Overall balance assessment: Needs assistance Sitting-balance support: No upper extremity supported;Feet supported Sitting balance-Leahy Scale: Good     Standing balance support: Bilateral upper extremity supported;During functional activity Standing balance-Leahy Scale: Poor Standing balance comment: cues for WB status in standing                            ADL  Overall ADL's : Needs assistance/impaired Eating/Feeding: Set up;Sitting   Grooming: Set up;Sitting   Upper Body Bathing: Min guard;Sitting   Lower Body Bathing: Minimal assistance;Sit to/from stand   Upper Body Dressing : Set up;Sitting;With adaptive equipment   Lower Body Dressing: Minimal assistance;Sit to/from stand;With adaptive equipment   Toilet Transfer: Min guard;Ambulation;BSC;RW   Toileting- Clothing Manipulation and Hygiene: Minimal assistance;Sit to/from stand   Tub/ Shower Transfer: Min guard;Ambulation;3 in 1;Rolling walker   Functional mobility during ADLs: Min guard;Rolling walker General ADL Comments: Education and cues for TWB status during ambulaiton. Education on techniques and AE for safe completion of ADLs with post hip precautions. Provided long-handled sponge and sock aid.      Vision                     Perception     Praxis      Pertinent Vitals/Pain 8/10 RLE. Repositioned. Pt stated she has spoken with nursing about pain meds.     Hand Dominance Right   Extremity/Trunk Assessment Upper Extremity Assessment Upper Extremity Assessment: Overall WFL for tasks assessed   Lower Extremity Assessment Lower Extremity Assessment: Defer to PT evaluation       Communication Communication Communication: No difficulties   Cognition Arousal/Alertness: Awake/alert Behavior During Therapy: WFL for tasks assessed/performed Overall Cognitive Status: Within Functional Limits for tasks assessed                     General Comments       Exercises Exercises: Total Joint     Shoulder  Instructions      Home Living Family/patient expects to be discharged to:: Private residence Living Arrangements: Parent;Children Available Help at Discharge: Family;Available 24 hours/day Type of Home: House Home Access: Stairs to enter [employer] of Steps: 3 Entrance Stairs-Rails: None Home Layout: One level     Bathroom  Shower/Tub: Teacher, early years/pre: Standard Bathroom Accessibility: Yes How Accessible: Accessible via walker Home Equipment: Hospital bed (pt states she is buying hospital bed from a friend)          Prior Functioning/Environment Level of Independence: Independent             OT Diagnosis: Acute pain   OT Problem List: Decreased strength;Decreased range of motion;Decreased activity tolerance;Impaired balance (sitting and/or standing);Decreased safety awareness;Decreased knowledge of use of DME or AE;Decreased knowledge of precautions;Obesity;Pain   OT Treatment/Interventions: Self-care/ADL training;Therapeutic exercise;DME and/or AE instruction;Therapeutic activities;Patient/family education;Balance training    OT Goals(Current goals can be found in the care plan section) Acute Rehab OT Goals Patient Stated Goal: not stated OT Goal Formulation: With patient Time For Goal Achievement: 06/09/13 Potential to Achieve Goals: Good ADL Goals Pt Will Perform Lower Body Bathing: with supervision;with adaptive equipment;sit to/from stand Pt Will Perform Lower Body Dressing: with supervision;with adaptive equipment;sit to/from stand Pt Will Transfer to Toilet: with supervision;ambulating (3n1 over toilet) Pt Will Perform Toileting - Clothing Manipulation and hygiene: with supervision;with adaptive equipment;sit to/from stand Pt Will Perform Tub/Shower Transfer: with supervision;ambulating;3 in 1;rolling walker Additional ADL Goal #1: Pt will perform sit<>supine at supervision level while observing precautions to prepare for OOB ADLS.  OT Frequency: Min 2X/week   Barriers to D/C:            Co-evaluation              End of Session Equipment Utilized During Treatment: Gait belt;Rolling walker  Activity Tolerance: Patient limited by pain Patient left: in bed;with call bell/phone within reach;with family/visitor present   Time: 9678-9381 OT Time Calculation  (min): 21 min Charges:  OT General Charges $OT Visit: 1 Procedure OT Evaluation $Initial OT Evaluation Tier I: 1 Procedure OT Treatments $Self Care/Home Management : 8-22 mins G-Codes:    Hortencia Pilar 06-12-13, 1:26 PM

## 2013-06-02 NOTE — Progress Notes (Signed)
   PATIENT ID: Michelle [name]   1 Day Post-Op Procedure(s) (LRB): POSTERIOR RIGHT TOTAL HIP ARTHROPLASTY (Right)  Subjective: Doing well this am. Sitting up in chair eating breakfast. Reports pain under control compared to yesterday. No complaints or concerns.   Objective:  Filed Vitals:   06/02/13 0450  BP: 112/40  Pulse: 72  Temp: 97.4 F (36.3 C)  Resp: 18     R hip dressing c/d/i Wiggles toes, distally nvi  Labs:   Recent Labs  06/01/13 1110 06/02/13 0405  HGB 11.5* 11.5*   Recent Labs  06/01/13 1110 06/02/13 0405  WBC 6.1 6.8  RBC 3.84* 3.79*  HCT 35.4* 35.5*  PLT PLATELET CLUMPS NOTED ON SMEAR, COUNT APPEARS DECREASED 104*   Recent Labs  06/02/13 0405  NA 139  K 4.6  CL 101  CO2 27  BUN 12  CREATININE 1.02  GLUCOSE 220*  CALCIUM 8.4    Assessment and Plan: 1 day s/p right THA with intraoperative proximal femur fracture Touchdown weight bearing Continue current pain mgmt Up with therapy today, recc OT- ordered Equipment ordered Likely d/c home tomorrow pending PT, HHPT  VTE proph: ASA 325mg  BID, SCDs

## 2013-06-03 LAB — CBC
HCT: 29.1 % — ABNORMAL LOW (ref 36.0–46.0)
Hemoglobin: 9.5 g/dL — ABNORMAL LOW (ref 12.0–15.0)
MCH: 30.4 pg (ref 26.0–34.0)
MCHC: 32.6 g/dL (ref 30.0–36.0)
MCV: 93.3 fL (ref 78.0–100.0)
Platelets: 101 10*3/uL — ABNORMAL LOW (ref 150–400)
RBC: 3.12 MIL/uL — ABNORMAL LOW (ref 3.87–5.11)
RDW: 15.1 % (ref 11.5–15.5)
WBC: 9.2 10*3/uL (ref 4.0–10.5)

## 2013-06-03 MED ORDER — INSULIN ASPART 100 UNIT/ML ~~LOC~~ SOLN: 0.0000 [IU] | Freq: Three times a day (TID) | SUBCUTANEOUS | Status: DC

## 2013-06-03 MED ORDER — HYDROMORPHONE HCL 2 MG PO TABS: 2.0000 mg | ORAL_TABLET | ORAL | Status: DC

## 2013-06-03 NOTE — Care Management Note (Signed)
    Page 1 of 2   06/03/2013     1:01:38 PM CARE MANAGEMENT NOTE 06/03/2013  Patient:  Michelle [name], Michelle [name]   Account Number:  [id number]  Date Initiated:  06/03/2013  Documentation initiated by:  [facility]  Subjective/Objective Assessment:   adm:  right hip pain; right total hip arthroplasty     Action/Plan:   discharge planning   Anticipated DC Date:  06/03/2013   Anticipated DC Plan:  [place]  CM consult      [facility] Choice  HOME HEALTH   Choice offered to / List presented to:     DME arranged  3-N-1  [place]      DME agency  [place] arranged  [place].   Status of service:  Completed, signed off Medicare Important Message given?   (If response is "NO", the following Medicare IM given date fields will be blank) Date Medicare IM given:   Date Additional Medicare IM given:    Discharge Disposition:  [place]  Per UR Regulation:    If discussed at Long Length of Stay Meetings, dates discussed:    Comments:  06/03/13 12:45 CM notified [place] rep, Winnie of pt's discharge which was set up with [place] rep, Pearletha Forge for HHPT.  DME delivery rep, Jeneen Rinks in to talk with pt and make arrangements for delivery of wheelchair/cushion to pt's home.  3n1, rw delivered to room prior to discharge.  No other CM needs were communicated.  Mariane Masters, BSN, CM [phone].

## 2013-06-03 NOTE — Progress Notes (Signed)
Physical Therapy Treatment Patient Details Name: Michelle [name] MRN: [id number] DOB: 1965/05/07 Today's Date: 06/03/2013    History of Present Illness s/p Rt posterior THA  06/01/13. intraoperative proximal femur fracture    PT Comments    Pt progressing well, and likely continues to put too much weight on RLE with mobility despite cues.  Pt states she feels ready to DC home today.  PA in during session to discuss DC with pt during session.     Follow Up Recommendations  Home health PT;Supervision/Assistance - 24 hour     Equipment Recommendations  Rolling walker with 5" wheels;3in1 (PT)    Recommendations for Other Services OT consult     Precautions / Restrictions Precautions Precautions: Posterior Hip;Fall Precaution Booklet Issued: Yes (comment) Precaution Comments: able to state 2/3 hip precautions Required Braces or Orthoses: Knee Immobilizer - Right Knee Immobilizer - Right: Other (comment) Restrictions Weight Bearing Restrictions: Yes RLE Weight Bearing: Touchdown weight bearing    Mobility  Bed Mobility Overal bed mobility: Needs Assistance Bed Mobility: Supine to Sit;Sit to Supine     Supine to sit: Modified independent (Device/Increase time) Sit to supine: Modified independent (Device/Increase time)   General bed mobility comments: pt uses a blanket to assist RLE. Without blanket pt needs min A with RLE  Transfers Overall transfer level: Needs assistance Equipment used: Rolling walker (2 wheeled) Transfers: Sit to/from Stand Sit to Stand: Supervision         General transfer comment: pt demonstrated proper technique today with hand and foot palcement  Ambulation/Gait Ambulation/Gait assistance: Supervision Ambulation Distance (Feet): 75 Feet Assistive device: Rolling walker (2 wheeled) Gait Pattern/deviations: Step-to pattern;Decreased stride length Gait velocity: decreased   General Gait Details: continue to cue pt not to weightbear  through RLE. Pt states it is difficult to put weight through her LUE.     Stairs Stairs: Yes Stairs assistance: Min assist Stair Management: Two rails;Forwards;Step to pattern (Also practiced sideways with 1 rail.) Number of Stairs: 3 (performed 3 times) General stair comments: Continue to cue pt not to weightbear through RLE . She states she knows this and is trying not to put weight through it.    Wheelchair Mobility    Modified Rankin (Stroke Patients Only)       Balance                                    Cognition Arousal/Alertness: Awake/alert Behavior During Therapy: WFL for tasks assessed/performed Overall Cognitive Status: Within Functional Limits for tasks assessed                      Exercises Total Joint Exercises Ankle Circles/Pumps: AROM;Strengthening;Both;5 reps;Supine Quad Sets: AROM;Right;5 reps;Supine Short Arc Quad: AROM;Right;10 reps;Supine Heel Slides: AAROM;Right;10 reps;Supine Hip ABduction/ADduction: AAROM;Right;10 reps;Supine Long Arc Quad: AROM;Right;10 reps;Seated    General Comments General comments (skin integrity, edema, etc.): pt states she feels ready for DC home today with assist. answered all questions related to mobility including car transfers.        Pertinent Vitals/Pain At one point during session pt was returning to bed and using the blanket to assist her RLE, pt states she moved it too quickly which caused severe pain. This pain went down after several minutes. Pt positioned comfortably at end of session.      Home Living  Prior Function            PT Goals (current goals can now be found in the care plan section) Acute Rehab PT Goals Patient Stated Goal: to go home today Progress towards PT goals: Progressing toward goals (Anticipate DC to home today)    Frequency  7X/week    PT Plan Current plan remains appropriate    Co-evaluation             End of  Session Equipment Utilized During Treatment: Gait belt Activity Tolerance: Patient tolerated treatment well Patient left: in bed;with call bell/phone within reach;with family/visitor present     Time: 2505-3976 PT Time Calculation (min): 47 min  Charges:  $Gait Training: 23-37 mins $Therapeutic Exercise: 8-22 mins                    G CodesVincent [name] 2013/06/15, 11:03 AM Michelle [name], [place] 2013-06-15

## 2013-06-03 NOTE — Progress Notes (Signed)
   PATIENT ID: Michelle [name]   2 Days Post-Op Procedure(s) (LRB): POSTERIOR RIGHT TOTAL HIP ARTHROPLASTY (Right)  Subjective: Doing well today, did stairs this am and cleared by PT. Still significant pain limiting mobility and feels like this is limiting her d/c. No other complaints or concerns.  Objective:  Filed Vitals:   06/03/13 0601  BP: 126/63  Pulse: 64  Temp: 97.6 F (36.4 C)  Resp: 16     Right hip dressing c/d/i No surround swelling, erythema, warmth Wiggles toes, distally NVI  Labs:   Recent Labs  06/01/13 1110 06/02/13 0405 06/03/13 0530  HGB 11.5* 11.5* 9.5*   Recent Labs  06/02/13 0405 06/03/13 0530  WBC 6.8 9.2  RBC 3.79* 3.12*  HCT 35.5* 29.1*  PLT 104* 101*   Recent Labs  06/02/13 0405  NA 139  K 4.6  CL 101  CO2 27  BUN 12  CREATININE 1.02  GLUCOSE 220*  CALCIUM 8.4    Assessment and Plan: 2 days s/p right post THA Cleared by PT today and d/c home with HHPT Scripts in chart, will add dilaudid 1 daily # 7 . No refills will be given  ASA 325mg  BID Thrombocytopenia- notified patient about low platelets, will follow up with PCP next week [facility]  VTE proph: ASA 325mg  BID, SCDs

## 2013-06-03 NOTE — Progress Notes (Signed)
Written and verbal d/c instructions reviewed with patient and family. Both verbalize understanding. Prescriptions received. Patient wants MD to reeturn and write prescriptions each one on a separate paper. Patient told if MD was on floor I would relay request. Patient also states she will take medical equipment from room unless assurred that her [place] has delivered hers, patient encouraged to phone [employer] and inquire. Patient states she does not know phone number, enouraged to use her internet access on phone, look up # and phone to inquire about equipment. [facility] on floor. Associate will contact [place] for patient.

## 2013-06-05 ENCOUNTER — Encounter (HOSPITAL_COMMUNITY): Payer: Self-pay | Admitting: Orthopedic Surgery

## 2013-06-05 NOTE — Discharge Summary (Signed)
Patient ID: Michelle [name] MRN: [id number] DOB/AGE: [birth year] 48 y.o.  Admit date: 06/01/2013 Discharge date: 06/05/2013  Admission Diagnoses:  Principal Problem:   Osteoarthritis of right hip Active Problems:   Severe obesity (BMI >= 40)   Discharge Diagnoses:  Same  Past Medical History  Diagnosis Date  . Asthma   . GERD (gastroesophageal reflux disease)   . Peptic ulcer disease 1984  . Episodic tension type headache   . Arthritis     hips, knees and spine  . Depression   . Carpal tunnel syndrome of left wrist   . Barrett's esophagus   . Gastroparesis   . IBS (irritable bowel syndrome)   . Narcotic abuse   . Bronchitis   . DM (diabetes mellitus)     diet controlled  . Anxiety     panic attacts  . Bipolar disorder   . Chronic kidney disease     does not see a nephrologist  . Psoriasis     Surgeries: Procedure(s): POSTERIOR RIGHT TOTAL HIP ARTHROPLASTY on 06/01/2013   Consultants:    Discharged Condition: Improved  Hospital Course: Michelle [name] is an 48 y.o. female who was admitted 06/01/2013 for operative treatment ofOsteoarthritis of right hip.history of pain and functional disability in the right hip(s) due to arthritis and patient has failed non-surgical conservative treatments for greater than 12 weeks to include NSAID's and/or analgesics, supervised PT with diminished ADL's post treatment, use of assistive devices and activity modification.  Patient has severe unremitting pain that affects,sleep, daily activities, and work/hobbies. After pre-op clearance the patient was taken to the operating room on 06/01/2013 and underwent  Procedure(s): POSTERIOR RIGHT TOTAL HIP ARTHROPLASTY.    Patient was given perioperative antibiotics: Anti-infectives   Start     Dose/Rate Route Frequency Ordered Stop   06/01/13 1345  clindamycin (CLEOCIN) IVPB 600 mg     600 mg 100 mL/hr over 30 Minutes Intravenous Every 6 hours 06/01/13 1341 06/02/13 0059   06/01/13 0600   clindamycin (CLEOCIN) IVPB 900 mg     900 mg 100 mL/hr over 30 Minutes Intravenous On call to O.R. 05/31/13 1425 06/01/13 0745       Patient was given sequential compression devices, early ambulation, and ASA 325mg  BID to prevent DVT.  Patient benefited maximally from hospital stay and there were no complications.    Recent vital signs: No data found.    Recent laboratory studies:  Recent Labs  06/03/13 0530  WBC 9.2  HGB 9.5*  HCT 29.1*  PLT 101*     Discharge Medications:     Medication List    STOP taking these medications       HYDROcodone-acetaminophen 5-325 MG per tablet  Commonly known as:  NORCO/VICODIN      TAKE these medications       aspirin EC 325 MG tablet  Take 1 tablet (325 mg total) by mouth 2 (two) times daily after a meal.     ciprofloxacin-dexamethasone otic suspension  Commonly known as:  CIPRODEX  Place 4 drops into both ears 2 (two) times daily.     COMBIVENT RESPIMAT 20-100 MCG/ACT Aers respimat  Generic drug:  Ipratropium-Albuterol  Inhale 2 puffs into the lungs 3 (three) times daily as needed for wheezing.     DULoxetine 30 MG capsule  Commonly known as:  CYMBALTA  Take 30 mg by mouth 2 (two) times daily.     furosemide 20 MG tablet  Commonly known as:  LASIX  Take  20 mg by mouth daily.     gabapentin 300 MG capsule  Commonly known as:  NEURONTIN  Take 600 mg by mouth 2 (two) times daily.     HYDROmorphone 2 MG tablet  Commonly known as:  DILAUDID  Take 1 tablet (2 mg total) by mouth 1 day or 1 dose.     lamoTRIgine 25 MG tablet  Commonly known as:  LAMICTAL  Take 25 mg by mouth 2 (two) times daily.     methocarbamol 750 MG tablet  Commonly known as:  ROBAXIN-750  Take 1 tablet (750 mg total) by mouth every 8 (eight) hours as needed for muscle spasms.     omeprazole 40 MG capsule  Commonly known as:  PRILOSEC  Take 1 capsule (40 mg total) by mouth daily.     oxyCODONE-acetaminophen 5-325 MG per tablet  Commonly known  as:  PERCOCET/ROXICET  Take 1-2 tablets by mouth every 4 (four) hours as needed.     traZODone 50 MG tablet  Commonly known as:  DESYREL  Take 100 mg by mouth at bedtime.     triamcinolone 0.025 % ointment  Commonly known as:  KENALOG  Apply 1 application topically 2 (two) times daily.        Diagnostic Studies: Dg Chest 2 View  05/25/2013   CLINICAL DATA:  PRE OP  EXAM: CHEST  2 VIEW  COMPARISON:  None.  FINDINGS: The heart size and mediastinal contours are within normal limits. Both lungs are clear. The visualized skeletal structures are unremarkable. Stable small punctate calcified granulomas right lung base.  IMPRESSION: No active cardiopulmonary disease.   Electronically Signed   By: Margaree Mackintosh M.D.   On: 05/25/2013 17:14   Dg Pelvis Portable  06/01/2013   CLINICAL DATA:  Right hip replacement.  EXAM: PORTABLE PELVIS 1-2 VIEWS  COMPARISON:  MR HIP*R* W/O CM dated 05/01/2013  FINDINGS: Patient has had prior right hip replacement with good anatomic alignment. Prior lumbar sacral spine fusion. No evidence of fracture. No evidence of dislocation.  IMPRESSION: Right hip replacement with good anatomic alignment.   Electronically Signed   By: Marcello Moores  Register   On: 06/01/2013 13:44   Dg Hip Portable 1 View Right  06/01/2013   CLINICAL DATA:  Right hip replacement.  EXAM: PORTABLE RIGHT HIP - 1 VIEW  COMPARISON:  MR HIP*R* W/O CM dated 05/01/2013; DG KNEE COMPLETE 4 VIEWS*R* dated 09/22/2012  FINDINGS: Portable cross-table lateral right hip reveals good anatomic alignment status post open reduction internal right hip fixation .  IMPRESSION: Good anatomic alignment on lateral view status post right hip replacement.   Electronically Signed   By: Marcello Moores  Register   On: 06/01/2013 13:48    Disposition: 01-Home or Self Care      Discharge Instructions   Call MD / Call 911    Complete by:  As directed   If you experience chest pain or shortness of breath, CALL 911 and be transported to the  hospital emergency room.  If you develope a fever above 101 F, pus (white drainage) or increased drainage or redness at the wound, or calf pain, call your surgeon's office.     Constipation Prevention    Complete by:  As directed   Drink plenty of fluids.  Prune juice may be helpful.  You may use a stool softener, such as Colace (over the counter) 100 mg twice a day.  Use MiraLax (over the counter) for constipation as needed.  Diet - low sodium heart healthy    Complete by:  As directed      Increase activity slowly as tolerated    Complete by:  As directed      Touch down weight bearing    Complete by:  As directed   Laterality:  right  Extremity:  Lower           Follow-up Information   Follow up with GRAVES,JOHN L, MD. Schedule an appointment as soon as possible for a visit in 2 weeks.   Specialty:  Orthopedic Surgery   Contact information:   [place] [address] [phone]       Follow up with [facility]. (home health physical therapy)    Contact information:   [address] [place] [address] [phone]        Signed: Grier Mitts 06/05/2013, 2:54 PM

## 2013-06-21 ENCOUNTER — Encounter: Payer: Medicaid Other | Admitting: Internal Medicine

## 2013-07-06 NOTE — OR Nursing (Signed)
Addendum to scope page 

## 2013-10-01 ENCOUNTER — Encounter: Payer: Self-pay | Admitting: Internal Medicine

## 2014-04-18 ENCOUNTER — Emergency Department (HOSPITAL_COMMUNITY)
Admission: EM | Admit: 2014-04-18 | Discharge: 2014-04-18 | Disposition: A | Discharge status: Home / Self Care | Payer: Medicaid Other | Attending: Emergency Medicine | Admitting: Emergency Medicine

## 2014-04-18 ENCOUNTER — Encounter (HOSPITAL_COMMUNITY): Payer: Self-pay | Admitting: Emergency Medicine

## 2014-04-18 DIAGNOSIS — K219 Gastro-esophageal reflux disease without esophagitis: Secondary | ICD-10-CM | POA: Diagnosis not present

## 2014-04-18 DIAGNOSIS — Z872 Personal history of diseases of the skin and subcutaneous tissue: Secondary | ICD-10-CM | POA: Insufficient documentation

## 2014-04-18 DIAGNOSIS — M25532 Pain in left wrist: Secondary | ICD-10-CM | POA: Diagnosis not present

## 2014-04-18 DIAGNOSIS — M1990 Unspecified osteoarthritis, unspecified site: Secondary | ICD-10-CM | POA: Diagnosis not present

## 2014-04-18 DIAGNOSIS — N189 Chronic kidney disease, unspecified: Secondary | ICD-10-CM | POA: Diagnosis not present

## 2014-04-18 DIAGNOSIS — G8929 Other chronic pain: Secondary | ICD-10-CM | POA: Diagnosis not present

## 2014-04-18 DIAGNOSIS — Z880 Allergy status to penicillin: Secondary | ICD-10-CM | POA: Insufficient documentation

## 2014-04-18 DIAGNOSIS — Z7982 Long term (current) use of aspirin: Secondary | ICD-10-CM | POA: Insufficient documentation

## 2014-04-18 DIAGNOSIS — E119 Type 2 diabetes mellitus without complications: Secondary | ICD-10-CM | POA: Diagnosis not present

## 2014-04-18 DIAGNOSIS — F319 Bipolar disorder, unspecified: Secondary | ICD-10-CM | POA: Insufficient documentation

## 2014-04-18 DIAGNOSIS — Z9889 Other specified postprocedural states: Secondary | ICD-10-CM | POA: Insufficient documentation

## 2014-04-18 DIAGNOSIS — J45909 Unspecified asthma, uncomplicated: Secondary | ICD-10-CM | POA: Insufficient documentation

## 2014-04-18 DIAGNOSIS — Z8711 Personal history of peptic ulcer disease: Secondary | ICD-10-CM | POA: Insufficient documentation

## 2014-04-18 DIAGNOSIS — Z720 Tobacco use: Secondary | ICD-10-CM | POA: Insufficient documentation

## 2014-04-18 DIAGNOSIS — F410 Panic disorder [episodic paroxysmal anxiety] without agoraphobia: Secondary | ICD-10-CM | POA: Insufficient documentation

## 2014-04-18 DIAGNOSIS — Z79899 Other long term (current) drug therapy: Secondary | ICD-10-CM | POA: Diagnosis not present

## 2014-04-18 MED ORDER — MELOXICAM 7.5 MG PO TABS: 7.5000 mg | ORAL_TABLET | Freq: Every day | ORAL | Status: DC

## 2014-04-18 MED ORDER — OXYCODONE-ACETAMINOPHEN 5-325 MG PO TABS
1.0000 | ORAL_TABLET | Freq: Once | ORAL | Status: AC
  Administered 2014-04-18: 1 via ORAL
  Filled 2014-04-18: qty 1

## 2014-04-18 MED ORDER — IBUPROFEN 400 MG PO TABS
600.0000 mg | ORAL_TABLET | Freq: Once | ORAL | Status: AC
  Administered 2014-04-18: 600 mg via ORAL
  Filled 2014-04-18: qty 2

## 2014-04-18 NOTE — ED Notes (Signed)
Nurse at bedside discharging pt.

## 2014-04-18 NOTE — ED Notes (Signed)
Pt presents with left wrist pain for the past 4 months- admits to having surgery on left wrist in 2014- denies new injury.  Pt able to move all fingers.

## 2014-04-18 NOTE — ED Provider Notes (Signed)
CSN: [id number]     Arrival date & time 04/18/14  1431 History  This chart was scribed for non-physician practitioner, Noland Fordyce, PA-C, working with Ripley Fraise, MD by Ladene Artist, ED Scribe. This patient was seen in room TR09C/TR09C and the patient's care was started at 2:52 PM.   Chief Complaint  Patient presents with  . Wrist Pain   The history is provided by the patient. No language interpreter was used.   HPI Comments: Michelle [name] is a 49 y.o. female, with a h/o asthma, DM, arthritis, carpal tunnel, narcotic abuse, IBS, CKD, anxiety, bipolar, who presents to the Emergency Department complaining of gradually worsening, persistent L wrist pain that radiates up L arm and into L fingers for the past 4 months. Pt states that pain is exacerbated with movement. She reports associated L wrist swelling, a tingling sensation in her L fingers and L arm. No new injury. She reports h/o surgery on L wrist 2 years ago. She states that she contacted the emergency nurse line for her surgeon Dr. Leanora Cover but states that she was told that her pain was non-emergent and the nurse hung up on her as pt called in the middle off the night.  Pt has not attempted to call during regular office hours. Pt has tried muscle rub without relief. No other alleviating or aggravating factors.  Past Medical History  Diagnosis Date  . Asthma   . GERD (gastroesophageal reflux disease)   . Peptic ulcer disease 1984  . Episodic tension type headache   . Arthritis     hips, knees and spine  . Depression   . Carpal tunnel syndrome of left wrist   . Barrett's esophagus   . Gastroparesis   . IBS (irritable bowel syndrome)   . Narcotic abuse   . Bronchitis   . DM (diabetes mellitus)     diet controlled  . Anxiety     panic attacts  . Bipolar disorder   . Chronic kidney disease     does not see a nephrologist  . Psoriasis    Past Surgical History  Procedure Laterality Date  . Cervical fusion  2000     x2  . Joint replacement  2009    left   . Back surgery  2008    lumbar  fusion  . Cholecystectomy  1991  . Carpal tunnel release  2011    right hand  . Carpal tunnel release  11/23/2010    Procedure: CARPAL TUNNEL RELEASE;  Surgeon: Meredith Pel;  Location: [facility];  Service: Orthopedics;  Laterality: Left;  revision left carpal tunnel release neurolysis of median nerve  . Esophagogastroduodenoscopy  03/20/2011    Procedure: ESOPHAGOGASTRODUODENOSCOPY (EGD);  Surgeon: Irene Shipper, MD;  Location: [facility];  Service: Endoscopy;  Laterality: N/A;  Clarise Cruz /ja  . Knee arthroscopy Bilateral   . Carpectomy  10/05/2011    Procedure: CARPECTOMY;  Surgeon: Tennis Must, MD;  Location: [facility];  Service: Orthopedics;  Laterality: Left;  left proximal row carpectomy bone grafting capitate and radial styloidectomy  . Tubal ligation    . Total hip arthroplasty Right 06/01/2013    Procedure: POSTERIOR RIGHT TOTAL HIP ARTHROPLASTY;  Surgeon: Alta Corning, MD;  Location: [facility];  Service: Orthopedics;  Laterality: Right;   Family History  Problem Relation Age of Onset  . Esophageal cancer Father   . Clotting disorder Daughter   . Colon cancer Neg Hx   .  Rectal cancer Neg Hx   . Stomach cancer Neg Hx    History  Substance Use Topics  . Smoking status: Current Every Day Smoker -- 0.50 packs/day for 32 years    Types: Cigarettes  . Smokeless tobacco: Never Used  . Alcohol Use: No   OB History    No data available     Review of Systems  Musculoskeletal: Positive for joint swelling and arthralgias.   Allergies  Penicillins  Home Medications   Prior to Admission medications   Medication Sig Start Date End Date Taking? Authorizing Provider  aspirin EC 325 MG tablet Take 1 tablet (325 mg total) by mouth 2 (two) times daily after a meal. 06/01/13   Gary Fleet, PA-C  ciprofloxacin-dexamethasone (CIPRODEX) otic suspension Place 4 drops into both ears 2 (two) times daily.     Historical Provider, MD  DULoxetine (CYMBALTA) 30 MG capsule Take 30 mg by mouth 2 (two) times daily.     Historical Provider, MD  furosemide (LASIX) 20 MG tablet Take 20 mg by mouth daily.    Historical Provider, MD  gabapentin (NEURONTIN) 300 MG capsule Take 600 mg by mouth 2 (two) times daily.     Historical Provider, MD  HYDROmorphone (DILAUDID) 2 MG tablet Take 1 tablet (2 mg total) by mouth 1 day or 1 dose. 06/03/13   Danielle Ruthe Mannan, PA-C  Ipratropium-Albuterol (COMBIVENT RESPIMAT) 20-100 MCG/ACT AERS respimat Inhale 2 puffs into the lungs 3 (three) times daily as needed for wheezing.     Historical Provider, MD  lamoTRIgine (LAMICTAL) 25 MG tablet Take 25 mg by mouth 2 (two) times daily.    Historical Provider, MD  meloxicam (MOBIC) 7.5 MG tablet Take 1 tablet (7.5 mg total) by mouth daily. 04/18/14   Noland Fordyce, PA-C  methocarbamol (ROBAXIN-750) 750 MG tablet Take 1 tablet (750 mg total) by mouth every 8 (eight) hours as needed for muscle spasms. 06/01/13   Gary Fleet, PA-C  omeprazole (PRILOSEC) 40 MG capsule Take 1 capsule (40 mg total) by mouth daily. 05/10/13   Irene Shipper, MD  oxyCODONE-acetaminophen (PERCOCET/ROXICET) 5-325 MG per tablet Take 1-2 tablets by mouth every 4 (four) hours as needed. 06/01/13   Gary Fleet, PA-C  traZODone (DESYREL) 50 MG tablet Take 100 mg by mouth at bedtime.     Historical Provider, MD  triamcinolone (KENALOG) 0.025 % ointment Apply 1 application topically 2 (two) times daily. 05/08/13   Fransico Meadow, PA-C   BP 130/70 mmHg  Temp(Src) 97.9 F (36.6 C) (Oral)  Resp 17  Ht 5\' 9"  (1.753 m)  Wt 306 lb (138.801 kg)  BMI 45.17 kg/m2  SpO2 96%  LMP 02/27/2011 Physical Exam  Constitutional: She is oriented to person, place, and time. She appears well-developed and well-nourished.  HENT:  Head: Normocephalic and atraumatic.  Eyes: EOM are normal.  Neck: Normal range of motion.  Cardiovascular: Normal rate.   Pulmonary/Chest: Effort normal.   Musculoskeletal: Normal range of motion.  L wrist: well-healed surgical scar on dorsal aspect. Mild edema to dorsal aspect, radial side with tenderness. No erythema, warmth or fluctuance.   Neurological: She is alert and oriented to person, place, and time.  Skin: Skin is warm and dry.  Psychiatric: She has a normal mood and affect. Her behavior is normal.  Nursing note and vitals reviewed.  ED Course  Procedures (including critical care time) DIAGNOSTIC STUDIES: Oxygen Saturation is 96% on RA, normal by my interpretation.    COORDINATION OF CARE: 2:58  PM-Discussed treatment plan which includes Percocet, 600 mg ibuprofen and wrist splint with pt at bedside and pt agreed to plan.   Labs Review Labs Reviewed - No data to display  Imaging Review No results found.   EKG Interpretation None      MDM   Final diagnoses:  Left wrist pain  Chronic pain    Pt presenting to ED with c/o chronic right pain. No new falls or injuries. No evidence of underlying cellulitis. No imaging indicated at this time. Rx: wrist splint applied, Mobic. Advised to try to f/u with Dr. Fredna Dow as he is familiar with her surgery, however, due to pt not f/u in several years, resources provided for Dr. Burney Gauze as he is "on-call" hand surgeon today. Pt tearful that narcotics would not be prescribed home. Discussed new narcotic policy.  Pt given 1 tab vicodin in ED and discharged home, tearful but reports understanding.   I personally performed the services described in this documentation, which was scribed in my presence. The recorded information has been reviewed and is accurate.    Noland Fordyce, PA-C 04/18/14 [place], MD 04/19/14 [phone]

## 2014-04-19 ENCOUNTER — Emergency Department (HOSPITAL_COMMUNITY)
Admission: EM | Admit: 2014-04-19 | Discharge: 2014-04-19 | Disposition: A | Discharge status: Home / Self Care | Payer: Medicaid Other | Attending: Emergency Medicine | Admitting: Emergency Medicine

## 2014-04-19 ENCOUNTER — Encounter (HOSPITAL_COMMUNITY): Payer: Self-pay | Admitting: *Deleted

## 2014-04-19 ENCOUNTER — Emergency Department (HOSPITAL_COMMUNITY): Payer: Medicaid Other

## 2014-04-19 DIAGNOSIS — N189 Chronic kidney disease, unspecified: Secondary | ICD-10-CM | POA: Diagnosis not present

## 2014-04-19 DIAGNOSIS — K219 Gastro-esophageal reflux disease without esophagitis: Secondary | ICD-10-CM | POA: Insufficient documentation

## 2014-04-19 DIAGNOSIS — Z872 Personal history of diseases of the skin and subcutaneous tissue: Secondary | ICD-10-CM | POA: Insufficient documentation

## 2014-04-19 DIAGNOSIS — E119 Type 2 diabetes mellitus without complications: Secondary | ICD-10-CM | POA: Insufficient documentation

## 2014-04-19 DIAGNOSIS — Z880 Allergy status to penicillin: Secondary | ICD-10-CM | POA: Insufficient documentation

## 2014-04-19 DIAGNOSIS — Z791 Long term (current) use of non-steroidal anti-inflammatories (NSAID): Secondary | ICD-10-CM | POA: Diagnosis not present

## 2014-04-19 DIAGNOSIS — Z8781 Personal history of (healed) traumatic fracture: Secondary | ICD-10-CM | POA: Insufficient documentation

## 2014-04-19 DIAGNOSIS — M79651 Pain in right thigh: Secondary | ICD-10-CM | POA: Diagnosis not present

## 2014-04-19 DIAGNOSIS — Z720 Tobacco use: Secondary | ICD-10-CM | POA: Insufficient documentation

## 2014-04-19 DIAGNOSIS — F319 Bipolar disorder, unspecified: Secondary | ICD-10-CM | POA: Insufficient documentation

## 2014-04-19 DIAGNOSIS — Z8711 Personal history of peptic ulcer disease: Secondary | ICD-10-CM | POA: Diagnosis not present

## 2014-04-19 DIAGNOSIS — Z7982 Long term (current) use of aspirin: Secondary | ICD-10-CM | POA: Insufficient documentation

## 2014-04-19 DIAGNOSIS — Z79899 Other long term (current) drug therapy: Secondary | ICD-10-CM | POA: Diagnosis not present

## 2014-04-19 DIAGNOSIS — F410 Panic disorder [episodic paroxysmal anxiety] without agoraphobia: Secondary | ICD-10-CM | POA: Diagnosis not present

## 2014-04-19 DIAGNOSIS — R52 Pain, unspecified: Secondary | ICD-10-CM

## 2014-04-19 DIAGNOSIS — J45909 Unspecified asthma, uncomplicated: Secondary | ICD-10-CM | POA: Insufficient documentation

## 2014-04-19 DIAGNOSIS — M1990 Unspecified osteoarthritis, unspecified site: Secondary | ICD-10-CM | POA: Insufficient documentation

## 2014-04-19 DIAGNOSIS — Z792 Long term (current) use of antibiotics: Secondary | ICD-10-CM | POA: Diagnosis not present

## 2014-04-19 MED ORDER — HYDROMORPHONE HCL 1 MG/ML IJ SOLN
1.0000 mg | Freq: Once | INTRAMUSCULAR | Status: AC
  Administered 2014-04-19: 1 mg via INTRAMUSCULAR
  Filled 2014-04-19: qty 1

## 2014-04-19 MED ORDER — CYCLOBENZAPRINE HCL 10 MG PO TABS
5.0000 mg | ORAL_TABLET | Freq: Once | ORAL | Status: AC
  Administered 2014-04-19: 5 mg via ORAL
  Filled 2014-04-19: qty 1

## 2014-04-19 MED ORDER — OXYCODONE-ACETAMINOPHEN 5-325 MG PO TABS: 1.0000 | ORAL_TABLET | Freq: Four times a day (QID) | ORAL | Status: DC | PRN

## 2014-04-19 MED ORDER — ONDANSETRON 4 MG PO TBDP
4.0000 mg | ORAL_TABLET | Freq: Once | ORAL | Status: AC
  Administered 2014-04-19: 4 mg via ORAL
  Filled 2014-04-19: qty 1

## 2014-04-19 MED ORDER — OXYCODONE-ACETAMINOPHEN 5-325 MG PO TABS
1.0000 | ORAL_TABLET | Freq: Once | ORAL | Status: AC
  Administered 2014-04-19: 1 via ORAL

## 2014-04-19 MED ORDER — OXYCODONE-ACETAMINOPHEN 5-325 MG PO TABS
ORAL_TABLET | ORAL | Status: AC
  Filled 2014-04-19: qty 1

## 2014-04-19 NOTE — ED Provider Notes (Signed)
CSN: [id number]     Arrival date & time 04/19/14  1431 History   First MD Initiated Contact with Patient 04/19/14 1704     Chief Complaint  Patient presents with  . Leg Pain     (Consider location/radiation/quality/duration/timing/severity/associated sxs/prior Treatment) Patient is a 49 y.o. female presenting with leg pain. The history is provided by the patient and medical records. No language interpreter was used.  Leg Pain Location:  Leg Leg location:  R upper leg Pain details:    Quality:  Throbbing   Radiates to:  Does not radiate   Severity:  Severe   Onset quality:  Sudden   Duration:  1 day   Timing:  Constant   Progression:  Worsening Chronicity:  New Dislocation: no   Prior injury to area:  Yes (hip replacement) Worsened by:  Bearing weight   Past Medical History  Diagnosis Date  . Asthma   . GERD (gastroesophageal reflux disease)   . Peptic ulcer disease 1984  . Episodic tension type headache   . Arthritis     hips, knees and spine  . Depression   . Carpal tunnel syndrome of left wrist   . Barrett's esophagus   . Gastroparesis   . IBS (irritable bowel syndrome)   . Narcotic abuse   . Bronchitis   . DM (diabetes mellitus)     diet controlled  . Anxiety     panic attacts  . Bipolar disorder   . Chronic kidney disease     does not see a nephrologist  . Psoriasis    Past Surgical History  Procedure Laterality Date  . Cervical fusion  2000    x2  . Joint replacement  2009    left   . Back surgery  2008    lumbar  fusion  . Cholecystectomy  1991  . Carpal tunnel release  2011    right hand  . Carpal tunnel release  11/23/2010    Procedure: CARPAL TUNNEL RELEASE;  Surgeon: Meredith Pel;  Location: [facility];  Service: Orthopedics;  Laterality: Left;  revision left carpal tunnel release neurolysis of median nerve  . Esophagogastroduodenoscopy  03/20/2011    Procedure: ESOPHAGOGASTRODUODENOSCOPY (EGD);  Surgeon: Irene Shipper, MD;  Location: [facility]  ENDOSCOPY;  Service: Endoscopy;  Laterality: N/A;  Clarise Cruz /ja  . Knee arthroscopy Bilateral   . Carpectomy  10/05/2011    Procedure: CARPECTOMY;  Surgeon: Tennis Must, MD;  Location: [place];  Service: Orthopedics;  Laterality: Left;  left proximal row carpectomy bone grafting capitate and radial styloidectomy  . Tubal ligation    . Total hip arthroplasty Right 06/01/2013    Procedure: POSTERIOR RIGHT TOTAL HIP ARTHROPLASTY;  Surgeon: Alta Corning, MD;  Location: [place];  Service: Orthopedics;  Laterality: Right;   Family History  Problem Relation Age of Onset  . Esophageal cancer Father   . Clotting disorder Daughter   . Colon cancer Neg Hx   . Rectal cancer Neg Hx   . Stomach cancer Neg Hx    History  Substance Use Topics  . Smoking status: Current Every Day Smoker -- 0.50 packs/day for 32 years    Types: Cigarettes  . Smokeless tobacco: Never Used  . Alcohol Use: No   OB History    No data available     Review of Systems  All other systems reviewed and are negative.     Allergies  Penicillins  Home Medications   Prior  to Admission medications   Medication Sig Start Date End Date Taking? Authorizing Provider  aspirin EC 325 MG tablet Take 1 tablet (325 mg total) by mouth 2 (two) times daily after a meal. 06/01/13   Gary Fleet, PA-C  ciprofloxacin-dexamethasone (CIPRODEX) otic suspension Place 4 drops into both ears 2 (two) times daily.    Historical Provider, MD  DULoxetine (CYMBALTA) 30 MG capsule Take 30 mg by mouth 2 (two) times daily.     Historical Provider, MD  furosemide (LASIX) 20 MG tablet Take 20 mg by mouth daily.    Historical Provider, MD  gabapentin (NEURONTIN) 300 MG capsule Take 600 mg by mouth 2 (two) times daily.     Historical Provider, MD  HYDROmorphone (DILAUDID) 2 MG tablet Take 1 tablet (2 mg total) by mouth 1 day or 1 dose. 06/03/13   Danielle Ruthe Mannan, PA-C  Ipratropium-Albuterol (COMBIVENT RESPIMAT) 20-100 MCG/ACT AERS  respimat Inhale 2 puffs into the lungs 3 (three) times daily as needed for wheezing.     Historical Provider, MD  lamoTRIgine (LAMICTAL) 25 MG tablet Take 25 mg by mouth 2 (two) times daily.    Historical Provider, MD  meloxicam (MOBIC) 7.5 MG tablet Take 1 tablet (7.5 mg total) by mouth daily. 04/18/14   Noland Fordyce, PA-C  methocarbamol (ROBAXIN-750) 750 MG tablet Take 1 tablet (750 mg total) by mouth every 8 (eight) hours as needed for muscle spasms. 06/01/13   Gary Fleet, PA-C  omeprazole (PRILOSEC) 40 MG capsule Take 1 capsule (40 mg total) by mouth daily. 05/10/13   Irene Shipper, MD  oxyCODONE-acetaminophen (PERCOCET/ROXICET) 5-325 MG per tablet Take 1-2 tablets by mouth every 4 (four) hours as needed. 06/01/13   Gary Fleet, PA-C  traZODone (DESYREL) 50 MG tablet Take 100 mg by mouth at bedtime.     Historical Provider, MD  triamcinolone (KENALOG) 0.025 % ointment Apply 1 application topically 2 (two) times daily. 05/08/13   Fransico Meadow, PA-C   BP 116/67 mmHg  Pulse 79  Temp(Src) 98.2 F (36.8 C) (Oral)  Resp 20  Ht 5\' 10"  (1.778 m)  Wt 306 lb (138.801 kg)  BMI 43.91 kg/m2  SpO2 99%  LMP 02/27/2011 Physical Exam  Constitutional: She is oriented to person, place, and time. She appears well-developed and well-nourished. She appears distressed.  HENT:  Head: Normocephalic.  Eyes: Pupils are equal, round, and reactive to light.  Cardiovascular: Normal rate and regular rhythm.   Pulmonary/Chest: Effort normal and breath sounds normal.  Abdominal: Soft. Bowel sounds are normal.  Musculoskeletal: She exhibits tenderness. She exhibits no edema.  Upper right thigh pain/tenderness to palpation. History of right hip fracture, no history of fall or recent injury to area.  No posterior tenderness of thigh, knee.  No calf pain.  Distal pulses intact.  Lymphadenopathy:    She has no cervical adenopathy.  Neurological: She is alert and oriented to person, place, and time.  Skin: Skin is  warm and dry.  Psychiatric: She has a normal mood and affect.  Nursing note and vitals reviewed.   ED Course  Procedures (including critical care time) Labs Review Labs Reviewed - No data to display  Imaging Review Dg Hip Unilat With Pelvis 2-3 Views Right  04/19/2014   CLINICAL DATA:  Pain radiating to right knee  EXAM: RIGHT HIP (WITH PELVIS) 2-3 VIEWS  COMPARISON:  Jun 01, 2013  FINDINGS: Frontal pelvis as well as frontal and lateral right hip images were obtained. There is a total  hip prosthesis on the right with prosthetic components appearing well-seated. No acute fracture or dislocation. Left hip joint appears normal. There is postoperative screw fixation upper sacrum in the midline.  IMPRESSION: Total hip replacement on the right with prosthetic components well-seated. Postoperative change upper sacrum, stable. No acute fracture or dislocation. Left hip joint unremarkable.   Electronically Signed   By: Lowella Grip III M.D.   On: 04/19/2014 15:36     EKG Interpretation None     Radiology results reviewed and shared with patient.  No indication of fracture, dislocation, or hardware abnormality.  Analgesic, crutches.  Patient has muscle relaxant at home.  Follow-up with her orthopedic surgeon. MDM   Final diagnoses:  Pain    Right thigh pain.    Etta Quill, NP 04/19/14 2015  Orpah Greek, MD 04/20/14 Darlin Drop

## 2014-04-19 NOTE — ED Notes (Signed)
Pt reports hx of right hip surgery and has rods placed in right leg. States she woke up this morning with severe pain to right upper leg. Denies any injury. Pt is tearful and anxious at triage.

## 2014-04-19 NOTE — Discharge Instructions (Signed)

## 2014-06-03 ENCOUNTER — Encounter (HOSPITAL_COMMUNITY): Payer: Self-pay | Admitting: Family Medicine

## 2014-06-03 ENCOUNTER — Emergency Department (HOSPITAL_COMMUNITY): Payer: Medicaid Other

## 2014-06-03 ENCOUNTER — Emergency Department (HOSPITAL_COMMUNITY)
Admission: EM | Admit: 2014-06-03 | Discharge: 2014-06-03 | Disposition: A | Discharge status: Home / Self Care | Payer: Medicaid Other | Attending: Emergency Medicine | Admitting: Emergency Medicine

## 2014-06-03 DIAGNOSIS — K219 Gastro-esophageal reflux disease without esophagitis: Secondary | ICD-10-CM | POA: Diagnosis not present

## 2014-06-03 DIAGNOSIS — R51 Headache: Secondary | ICD-10-CM | POA: Diagnosis not present

## 2014-06-03 DIAGNOSIS — G8929 Other chronic pain: Secondary | ICD-10-CM | POA: Insufficient documentation

## 2014-06-03 DIAGNOSIS — Z880 Allergy status to penicillin: Secondary | ICD-10-CM | POA: Diagnosis not present

## 2014-06-03 DIAGNOSIS — Z720 Tobacco use: Secondary | ICD-10-CM | POA: Diagnosis not present

## 2014-06-03 DIAGNOSIS — F419 Anxiety disorder, unspecified: Secondary | ICD-10-CM | POA: Insufficient documentation

## 2014-06-03 DIAGNOSIS — L409 Psoriasis, unspecified: Secondary | ICD-10-CM | POA: Insufficient documentation

## 2014-06-03 DIAGNOSIS — E119 Type 2 diabetes mellitus without complications: Secondary | ICD-10-CM | POA: Insufficient documentation

## 2014-06-03 DIAGNOSIS — J45909 Unspecified asthma, uncomplicated: Secondary | ICD-10-CM | POA: Diagnosis not present

## 2014-06-03 DIAGNOSIS — N189 Chronic kidney disease, unspecified: Secondary | ICD-10-CM | POA: Diagnosis not present

## 2014-06-03 DIAGNOSIS — M159 Polyosteoarthritis, unspecified: Secondary | ICD-10-CM | POA: Insufficient documentation

## 2014-06-03 DIAGNOSIS — Z79899 Other long term (current) drug therapy: Secondary | ICD-10-CM | POA: Insufficient documentation

## 2014-06-03 DIAGNOSIS — M25532 Pain in left wrist: Secondary | ICD-10-CM | POA: Diagnosis not present

## 2014-06-03 DIAGNOSIS — F319 Bipolar disorder, unspecified: Secondary | ICD-10-CM | POA: Diagnosis not present

## 2014-06-03 DIAGNOSIS — Z8781 Personal history of (healed) traumatic fracture: Secondary | ICD-10-CM | POA: Diagnosis not present

## 2014-06-03 DIAGNOSIS — H9203 Otalgia, bilateral: Secondary | ICD-10-CM | POA: Diagnosis present

## 2014-06-03 MED ORDER — TRAMADOL HCL 50 MG PO TABS
50.0000 mg | ORAL_TABLET | Freq: Once | ORAL | Status: AC
  Administered 2014-06-03: 50 mg via ORAL
  Filled 2014-06-03: qty 1

## 2014-06-03 MED ORDER — NAPROXEN 500 MG PO TABS: 500.0000 mg | ORAL_TABLET | Freq: Two times a day (BID) | ORAL | Status: DC

## 2014-06-03 MED ORDER — CLOBETASOL PROPIONATE 0.05 % EX FOAM: Freq: Two times a day (BID) | CUTANEOUS | Status: DC

## 2014-06-03 MED ORDER — PREDNISONE 20 MG PO TABS: 40.0000 mg | ORAL_TABLET | Freq: Every day | ORAL | Status: DC

## 2014-06-03 NOTE — ED Notes (Signed)
Pt sts pain in both ears due to psoriasis. St salso knot on the back of her ear. sts left wrist pain.

## 2014-06-03 NOTE — ED Notes (Signed)
Declined W/C at D/C and was escorted to lobby by RN. 

## 2014-06-03 NOTE — ED Provider Notes (Signed)
CSN: [id number]     Arrival date & time 06/03/14  1424 History  This chart was scribed for non-physician practitioner, Hyman Bible, PA-C, working with Wandra Arthurs, MD, by Stephania Fragmin, ED Scribe. This patient was seen in room TR10C/TR10C and the patient's care was started at 3:20 PM.    Chief Complaint  Patient presents with  . Otalgia  . Wrist Pain   The history is provided by the patient. No language interpreter was used.     HPI Comments: Michelle [name] is a 49 y.o. female with a PMHx of psoriasis who presents to the Emergency Department complaining of bilateral otalgia that has been ongoing for 6 months, which she attributes to psoriasis. She also complains of associated severe itchiness, swelling, warmth, and scabbing on the inside of her bilateral ears; as well as a headache due to her otagia. She has noticed drainage and has been pulling out "moist scabs" from her ears, which regenerate after they are removed. She had been seen at Urgent Care for this and was given ointment and ear drops, which at first alleviated the itching, but is now no longer effective. She has tried applying peroxide, soap and water, earwash, and Vaseline with her ears, all with no relief. Patient currently has psoriasis on her elbows, neck, and back of her head. She has not seen a Dermatologist.     Patient also complains of left wrist pain that has been ongoing for years.   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 Patient has struck her wrist 3 times accidentally recently. She states she thinks needs to be X-Rayed. Patient has not taken anything for the pain.   She had surgery performed on the left wrist by Dr. Fredna Dow in September 2013.      Past Medical History  Diagnosis Date  . Asthma   . GERD (gastroesophageal reflux disease)   . Peptic ulcer disease 1984  . Episodic tension type  headache   . Arthritis     hips, knees and spine  . Depression   . Carpal tunnel syndrome of left wrist   . Barrett's esophagus   . Gastroparesis   . IBS (irritable bowel syndrome)   . Narcotic abuse   . Bronchitis   . DM (diabetes mellitus)     diet controlled  . Anxiety     panic attacts  . Bipolar disorder   . Chronic kidney disease     does not see a nephrologist  . Psoriasis    Past Surgical History  Procedure Laterality Date  . Cervical fusion  2000    x2  . Joint replacement  2009    left   . Back surgery  2008    lumbar  fusion  . Cholecystectomy  1991  . Carpal tunnel release  2011    right hand  . Carpal tunnel release  11/23/2010    Procedure: CARPAL TUNNEL RELEASE;  Surgeon: Meredith Pel;  Location: [facility];  Service: Orthopedics;  Laterality: Left;  revision left carpal tunnel release neurolysis of median nerve  . Esophagogastroduodenoscopy  03/20/2011    Procedure: ESOPHAGOGASTRODUODENOSCOPY (EGD);  Surgeon: Irene Shipper, MD;  Location: [facility] ENDOSCOPY;  Service: Endoscopy;  Laterality: N/A;  Clarise Cruz /ja  . Knee arthroscopy Bilateral   . Carpectomy  10/05/2011    Procedure: CARPECTOMY;  Surgeon: Tennis Must, MD;  Location: [place];  Service: Orthopedics;  Laterality: Left;  left proximal row carpectomy bone grafting capitate and radial styloidectomy  . Tubal ligation    . Total hip arthroplasty Right 06/01/2013    Procedure: POSTERIOR RIGHT TOTAL HIP ARTHROPLASTY;  Surgeon: Alta Corning, MD;  Location: [place];  Service: Orthopedics;  Laterality: Right;   Family History  Problem Relation Age of Onset  . Esophageal cancer Father   . Clotting disorder Daughter   . Colon cancer Neg Hx   . Rectal cancer Neg Hx   . Stomach cancer Neg Hx    History  Substance Use Topics  . Smoking status: Current Every Day Smoker -- 0.50 packs/day for 32 years    Types: Cigarettes  . Smokeless tobacco: Never Used  . Alcohol Use: No   OB History    No data  available     Review of Systems  HENT: Positive for ear discharge and ear pain.   Musculoskeletal: Positive for arthralgias.  Neurological: Positive for headaches (secondary to ear pain).    Allergies  Penicillins  Home Medications   Prior to Admission medications   Medication Sig Start Date End Date Taking? Authorizing Provider  aspirin EC 325 MG tablet Take 1 tablet (325 mg total) by mouth 2 (two) times daily after a meal. Patient not taking: Reported on 04/19/2014 06/01/13   Gary Fleet, PA-C  DULoxetine (CYMBALTA) 30 MG capsule Take 30 mg by mouth 2 (two) times daily.     Historical Provider, MD  furosemide (LASIX) 20 MG tablet Take 20 mg by mouth daily.    Historical Provider, MD  gabapentin (NEURONTIN) 300 MG capsule Take 600 mg by mouth 2 (two) times daily.     Historical Provider, MD  HYDROmorphone (DILAUDID) 2 MG tablet Take 1 tablet (2 mg total) by mouth 1 day or 1 dose. Patient not taking: Reported on 04/19/2014 06/03/13   Grier Mitts, PA-C  Ipratropium-Albuterol (COMBIVENT RESPIMAT) 20-100 MCG/ACT AERS respimat Inhale 2 puffs into the lungs 3 (three) times daily as needed for wheezing.     Historical Provider, MD  lamoTRIgine (LAMICTAL) 25 MG tablet Take 25 mg by mouth 2 (two) times daily.    Historical Provider, MD  meloxicam (MOBIC) 7.5 MG tablet Take 1 tablet (7.5 mg total) by mouth daily. Patient not taking: Reported on 04/19/2014 04/18/14   Noland Fordyce, PA-C  methocarbamol (ROBAXIN-750) 750 MG tablet Take 1 tablet (750 mg total) by mouth every 8 (eight) hours as needed for muscle spasms. 06/01/13   Gary Fleet, PA-C  omeprazole (PRILOSEC) 40 MG capsule Take 1 capsule (40 mg total) by mouth daily. 05/10/13   Irene Shipper, MD  oxyCODONE-acetaminophen (PERCOCET/ROXICET) 5-325 MG per tablet Take 1 tablet by mouth every 6 (six) hours as needed for severe pain. 04/19/14   Etta Quill, NP  traZODone (DESYREL) 50 MG tablet Take 100 mg by mouth at bedtime.     Historical  Provider, MD  triamcinolone (KENALOG) 0.025 % ointment Apply 1 application topically 2 (two) times daily. Patient not taking: Reported on 04/19/2014 05/08/13   Fransico Meadow, PA-C   BP 121/76 mmHg  Pulse 75  Temp(Src) 99 F (37.2 C)  Resp 18  SpO2 100%  LMP 02/27/2011 Physical Exam  Constitutional: She is oriented to person, place, and time. She appears well-developed and well-nourished. No distress.  HENT:  Head: Normocephalic and atraumatic.  Right Ear: Tympanic membrane and ear canal  normal.  Left Ear: Tympanic membrane and ear canal normal.  Psoriasis of the external ears bilaterally seen around bilateral ear canals.  No signs of infection  Eyes: Conjunctivae and EOM are normal.  Neck: Neck supple. No tracheal deviation present.  Cardiovascular: Normal rate, regular rhythm and normal heart sounds.   Pulses:      Radial pulses are 2+ on the right side, and 2+ on the left side.  Pulmonary/Chest: Effort normal and breath sounds normal. No respiratory distress.  Musculoskeletal: Normal range of motion.  Swelling of the radial aspect of the wrist. No erythema or warmth of the wrist.   Neurological: She is alert and oriented to person, place, and time.  Distal sensation of the hands intact.  Skin: Skin is warm and dry.  Psychiatric: She has a normal mood and affect. Her behavior is normal.  Nursing note and vitals reviewed.   ED Course  Procedures (including critical care time)  DIAGNOSTIC STUDIES: Oxygen Saturation is 100% on RA, normal by my interpretation.    COORDINATION OF CARE: 3:32 PM - Discussed treatment plan with pt at bedside which includes steroid medication for her ears, and XR and wrap for her wrist, and pt agreed to plan.   Labs Review Labs Reviewed - No data to display  Imaging Review Dg Wrist Complete Left  06/03/2014   CLINICAL DATA:  Left wrist pain and swelling, no known injury, initial encounter. 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 There are postsurgical changes consistent with removal of the majority of the first carpal row. No acute fracture is noted. No gross soft tissue abnormality is seen.  IMPRESSION: Postsurgical changes without acute abnormality.   Electronically Signed   By: Inez Catalina M.D.   On: 06/03/2014 16:30     EKG Interpretation None      MDM   Final diagnoses:  None   Patient presents today with chronic left wrist pain and also psoriasis of both external ears.   Xray of wrist negative for acute findings.  Patient neurovascularly intact.  No signs of infection.  Patient demanding narcotic pain medication.  She was informed that she would not be given narcotics for her chronic pain.  Patient was upset by this.  History of narcotic abuse was previously documented in her chart.  Patient also with Psoriasis of the external ears bilaterally.  No signs of infection at this time.  Patient given topical steroid solution for this.  Patient stable for discharge.  Return precautions given.     Hyman Bible, PA-C 06/04/14 Sanford Yao, MD 06/04/14 1524

## 2014-07-01 ENCOUNTER — Encounter (HOSPITAL_COMMUNITY): Payer: Self-pay | Admitting: Family Medicine

## 2014-07-01 ENCOUNTER — Emergency Department (HOSPITAL_COMMUNITY)
Admission: EM | Admit: 2014-07-01 | Discharge: 2014-07-01 | Disposition: A | Discharge status: Home / Self Care | Payer: Medicaid Other | Attending: Emergency Medicine | Admitting: Emergency Medicine

## 2014-07-01 DIAGNOSIS — Z8711 Personal history of peptic ulcer disease: Secondary | ICD-10-CM | POA: Insufficient documentation

## 2014-07-01 DIAGNOSIS — K219 Gastro-esophageal reflux disease without esophagitis: Secondary | ICD-10-CM | POA: Insufficient documentation

## 2014-07-01 DIAGNOSIS — Z880 Allergy status to penicillin: Secondary | ICD-10-CM | POA: Diagnosis not present

## 2014-07-01 DIAGNOSIS — E119 Type 2 diabetes mellitus without complications: Secondary | ICD-10-CM | POA: Diagnosis not present

## 2014-07-01 DIAGNOSIS — Z720 Tobacco use: Secondary | ICD-10-CM | POA: Insufficient documentation

## 2014-07-01 DIAGNOSIS — M159 Polyosteoarthritis, unspecified: Secondary | ICD-10-CM | POA: Diagnosis not present

## 2014-07-01 DIAGNOSIS — M545 Low back pain: Secondary | ICD-10-CM | POA: Diagnosis present

## 2014-07-01 DIAGNOSIS — N390 Urinary tract infection, site not specified: Secondary | ICD-10-CM | POA: Diagnosis not present

## 2014-07-01 DIAGNOSIS — Z872 Personal history of diseases of the skin and subcutaneous tissue: Secondary | ICD-10-CM | POA: Insufficient documentation

## 2014-07-01 DIAGNOSIS — F410 Panic disorder [episodic paroxysmal anxiety] without agoraphobia: Secondary | ICD-10-CM | POA: Diagnosis not present

## 2014-07-01 DIAGNOSIS — F319 Bipolar disorder, unspecified: Secondary | ICD-10-CM | POA: Diagnosis not present

## 2014-07-01 DIAGNOSIS — Z7982 Long term (current) use of aspirin: Secondary | ICD-10-CM | POA: Diagnosis not present

## 2014-07-01 DIAGNOSIS — G8929 Other chronic pain: Secondary | ICD-10-CM | POA: Diagnosis not present

## 2014-07-01 DIAGNOSIS — Z7952 Long term (current) use of systemic steroids: Secondary | ICD-10-CM | POA: Diagnosis not present

## 2014-07-01 DIAGNOSIS — G44219 Episodic tension-type headache, not intractable: Secondary | ICD-10-CM | POA: Insufficient documentation

## 2014-07-01 DIAGNOSIS — Z79899 Other long term (current) drug therapy: Secondary | ICD-10-CM | POA: Insufficient documentation

## 2014-07-01 DIAGNOSIS — N189 Chronic kidney disease, unspecified: Secondary | ICD-10-CM | POA: Diagnosis not present

## 2014-07-01 DIAGNOSIS — M549 Dorsalgia, unspecified: Secondary | ICD-10-CM

## 2014-07-01 DIAGNOSIS — J45901 Unspecified asthma with (acute) exacerbation: Secondary | ICD-10-CM | POA: Diagnosis not present

## 2014-07-01 LAB — URINALYSIS, ROUTINE W REFLEX MICROSCOPIC
Bilirubin Urine: NEGATIVE
Glucose, UA: NEGATIVE mg/dL
Hgb urine dipstick: NEGATIVE
Ketones, ur: NEGATIVE mg/dL
Nitrite: NEGATIVE
Protein, ur: NEGATIVE mg/dL
Specific Gravity, Urine: 1.018 (ref 1.005–1.030)
Urobilinogen, UA: 0.2 mg/dL (ref 0.0–1.0)
pH: 8 (ref 5.0–8.0)

## 2014-07-01 LAB — WET PREP, GENITAL
Clue Cells Wet Prep HPF POC: NONE SEEN
Trich, Wet Prep: NONE SEEN
Yeast Wet Prep HPF POC: NONE SEEN

## 2014-07-01 LAB — COMPREHENSIVE METABOLIC PANEL
ALT: 13 U/L — ABNORMAL LOW (ref 14–54)
AST: 14 U/L — ABNORMAL LOW (ref 15–41)
Albumin: 3.1 g/dL — ABNORMAL LOW (ref 3.5–5.0)
Alkaline Phosphatase: 99 U/L (ref 38–126)
Anion gap: 7 (ref 5–15)
BUN: 10 mg/dL (ref 6–20)
CO2: 30 mmol/L (ref 22–32)
Calcium: 8.8 mg/dL — ABNORMAL LOW (ref 8.9–10.3)
Chloride: 101 mmol/L (ref 101–111)
Creatinine, Ser: 1.1 mg/dL — ABNORMAL HIGH (ref 0.44–1.00)
GFR calc Af Amer: >60 mL/min (ref >60)
GFR calc non Af Amer: 58 mL/min — ABNORMAL LOW (ref >60)
Glucose, Bld: 112 mg/dL — ABNORMAL HIGH (ref 65–99)
Potassium: 4.5 mmol/L (ref 3.5–5.1)
Sodium: 138 mmol/L (ref 135–145)
Total Bilirubin: 0.6 mg/dL (ref 0.3–1.2)
Total Protein: 6.6 g/dL (ref 6.5–8.1)

## 2014-07-01 LAB — URINE MICROSCOPIC-ADD ON

## 2014-07-01 LAB — CBC WITH DIFFERENTIAL/PLATELET
Basophils Absolute: 0 10*3/uL (ref 0.0–0.1)
Basophils Relative: 0 % (ref 0–1)
Eosinophils Absolute: 0.1 10*3/uL (ref 0.0–0.7)
Eosinophils Relative: 3 % (ref 0–5)
HCT: 40.7 % (ref 36.0–46.0)
Hemoglobin: 13.4 g/dL (ref 12.0–15.0)
Lymphocytes Relative: 47 % — ABNORMAL HIGH (ref 12–46)
Lymphs Abs: 2.3 10*3/uL (ref 0.7–4.0)
MCH: 30.4 pg (ref 26.0–34.0)
MCHC: 32.9 g/dL (ref 30.0–36.0)
MCV: 92.3 fL (ref 78.0–100.0)
Monocytes Absolute: 0.3 10*3/uL (ref 0.1–1.0)
Monocytes Relative: 6 % (ref 3–12)
Neutro Abs: 2.1 10*3/uL (ref 1.7–7.7)
Neutrophils Relative %: 44 % (ref 43–77)
Platelets: 142 10*3/uL — ABNORMAL LOW (ref 150–400)
RBC: 4.41 MIL/uL (ref 3.87–5.11)
RDW: 14.8 % (ref 11.5–15.5)
WBC: 4.8 10*3/uL (ref 4.0–10.5)

## 2014-07-01 MED ORDER — HYDROCODONE-ACETAMINOPHEN 5-325 MG PO TABS: 1.0000 | ORAL_TABLET | Freq: Four times a day (QID) | ORAL | Status: DC | PRN

## 2014-07-01 MED ORDER — CYCLOBENZAPRINE HCL 10 MG PO TABS: 10.0000 mg | ORAL_TABLET | Freq: Two times a day (BID) | ORAL | Status: DC | PRN

## 2014-07-01 MED ORDER — CIPROFLOXACIN HCL 500 MG PO TABS: 500.0000 mg | ORAL_TABLET | Freq: Two times a day (BID) | ORAL | Status: DC

## 2014-07-01 MED ORDER — CYCLOBENZAPRINE HCL 10 MG PO TABS: 10.0000 mg | ORAL_TABLET | Freq: Once | ORAL | Status: DC

## 2014-07-01 MED ORDER — CYCLOBENZAPRINE HCL 10 MG PO TABS
10.0000 mg | ORAL_TABLET | Freq: Once | ORAL | Status: AC
  Administered 2014-07-01: 10 mg via ORAL
  Filled 2014-07-01: qty 1

## 2014-07-01 MED ORDER — HYDROCODONE-ACETAMINOPHEN 5-325 MG PO TABS
1.0000 | ORAL_TABLET | Freq: Once | ORAL | Status: AC
  Administered 2014-07-01: 1 via ORAL
  Filled 2014-07-01: qty 1

## 2014-07-01 MED ORDER — DICLOFENAC SODIUM 50 MG PO TBEC: 50.0000 mg | DELAYED_RELEASE_TABLET | Freq: Two times a day (BID) | ORAL | Status: DC

## 2014-07-01 MED ORDER — PHENAZOPYRIDINE HCL 100 MG PO TABS
200.0000 mg | ORAL_TABLET | Freq: Once | ORAL | Status: AC
  Administered 2014-07-01: 200 mg via ORAL
  Filled 2014-07-01: qty 2

## 2014-07-01 NOTE — ED Notes (Signed)
Pelvic cart at bedside. 

## 2014-07-01 NOTE — ED Notes (Signed)
Pt here for lower back pain and lower abd pain. sts kidney pain. sts x 1 week and seen by her PCP. sts urinary frequency and retention. sts foul urine odor.

## 2014-07-01 NOTE — ED Provider Notes (Signed)
CSN: [id number]     Arrival date & time 07/01/14  1354 History   This chart was scribed for non-physician practitioner, Hope M. Neese, NP working with Anthony Allen, MD by Eva Mathews, ED scribe. This patient was seen in room TR03C/TR03C and the patient's care was started at 2:51 PM    Chief Complaint  Patient presents with  . Back Pain   Patient is a 49 y.o. female presenting with back pain. The history is provided by the patient. No language interpreter was used.  Back Pain Location:  Lumbar spine Quality:  Aching Radiates to:  Does not radiate Pain severity:  Moderate Pain is:  Same all the time Onset quality:  Gradual Duration:  1 week Timing:  Constant Progression:  Unchanged Chronicity:  New Relieved by:  Nothing Worsened by:  Ambulation and movement Ineffective treatments:  None tried Associated symptoms: abdominal pain, fever and headaches     HPI Comments: Michelle [name] is a 49 y.o. female with Hx of asthma, GERD, Barrett's esophagus, anxiety, depression, Bipolar disorder, peptic ulcer disease, IBS, bronchitis, DM, Carpal Tunnel, and passive renal failure in 2012 who presents to the Emergency Department complaining of moderate, constant  lower back pain onset one week ago. She states associated lower abdominal pain, frequency, decreased urine, urgency, HA, nausea, vomiting, subjective fever, chills and vaginal odor. She states that pain is worsened by movement and ambulation. She states that she went to see her PCP, Dr. [facility] with [facility], 3 days ago for kidney function and A1C check, and she was unable to see him. The doctor she saw denied her her normal pain medication refill of Klonopin. Pt is a current every day smoker. She denies SI, HI, otalgia, vaginal discharge and vaginal bleeding.   Past Medical History  Diagnosis Date  . Asthma   . GERD (gastroesophageal reflux disease)   . Peptic ulcer disease 1984  . Episodic tension type headache   .  Arthritis     hips, knees and spine  . Depression   . Carpal tunnel syndrome of left wrist   . Barrett's esophagus   . Gastroparesis   . IBS (irritable bowel syndrome)   . Narcotic abuse   . Bronchitis   . DM (diabetes mellitus)     diet controlled  . Anxiety     panic attacts  . Bipolar disorder   . Chronic kidney disease     does not see a nephrologist  . Psoriasis    Past Surgical History  Procedure Laterality Date  . Cervical fusion  2000    x2  . Joint replacement  2009    left   . Back surgery  2008    lumbar  fusion  . Cholecystectomy  1991  . Carpal tunnel release  2011    right hand  . Carpal tunnel release  11/23/2010    Procedure: CARPAL TUNNEL RELEASE;  Surgeon: Gregory Scott Dean;  Location: [facility] OR;  Service: Orthopedics;  Laterality: Left;  revision left carpal tunnel release neurolysis of median nerve  . Esophagogastroduodenoscopy  03/20/2011    Procedure: ESOPHAGOGASTRODUODENOSCOPY (EGD);  Surgeon: John N Perry, MD;  Location: [facility];  Service: Endoscopy;  Laterality: N/A;  sara /ja  . Knee arthroscopy Bilateral   . Carpectomy  10/05/2011    Procedure: CARPECTOMY;  Surgeon: Kevin R Kuzma, MD;  Location: [facility];  Service: Orthopedics;  Laterality: Left;  left proximal row carpectomy bone grafting capitate and radial   styloidectomy  . Tubal ligation    . Total hip arthroplasty Right 06/01/2013    Procedure: POSTERIOR RIGHT TOTAL HIP ARTHROPLASTY;  Surgeon: Alta Corning, MD;  Location: [place];  Service: Orthopedics;  Laterality: Right;   Family History  Problem Relation Age of Onset  . Esophageal cancer Father   . Clotting disorder Daughter   . Colon cancer Neg Hx   . Rectal cancer Neg Hx   . Stomach cancer Neg Hx    History  Substance Use Topics  . Smoking status: Current Every Day Smoker -- 0.50 packs/day for 32 years    Types: Cigarettes  . Smokeless tobacco: Never Used  . Alcohol Use: No   OB History    No data available      Review of Systems  Constitutional: Positive for fever and chills.  HENT: Negative for ear pain.   Gastrointestinal: Positive for nausea, vomiting and abdominal pain.  Genitourinary: Positive for urgency, frequency and decreased urine volume. Negative for vaginal bleeding and vaginal discharge.       Abnormal vaginal odor noted.   Musculoskeletal: Positive for back pain.  Neurological: Positive for headaches.  Psychiatric/Behavioral: Negative for suicidal ideas.  All other systems reviewed and are negative.  Allergies  Penicillins  Home Medications   Prior to Admission medications   Medication Sig Start Date End Date Taking? Authorizing Provider  clobetasol (OLUX) 0.05 % topical foam Apply topically 2 (two) times daily. 06/03/14  Yes Heather Laisure, PA-C  DULoxetine (CYMBALTA) 30 MG capsule Take 30 mg by mouth 2 (two) times daily.    Yes Historical Provider, MD  furosemide (LASIX) 20 MG tablet Take 20 mg by mouth daily as needed for fluid or edema.    Yes Historical Provider, MD  gabapentin (NEURONTIN) 300 MG capsule Take 600 mg by mouth 2 (two) times daily.    Yes Historical Provider, MD  Ipratropium-Albuterol (COMBIVENT RESPIMAT) 20-100 MCG/ACT AERS respimat Inhale 2 puffs into the lungs 3 (three) times daily as needed for wheezing.    Yes Historical Provider, MD  lamoTRIgine (LAMICTAL) 25 MG tablet Take 25 mg by mouth 2 (two) times daily.   Yes Historical Provider, MD  omeprazole (PRILOSEC) 40 MG capsule Take 1 capsule (40 mg total) by mouth daily. 05/10/13  Yes Irene Shipper, MD  traZODone (DESYREL) 50 MG tablet Take 100 mg by mouth at bedtime.    Yes Historical Provider, MD  aspirin EC 325 MG tablet Take 1 tablet (325 mg total) by mouth 2 (two) times daily after a meal. Patient not taking: Reported on 04/19/2014 06/01/13   Gary Fleet, PA-C  ciprofloxacin (CIPRO) 500 MG tablet Take 1 tablet (500 mg total) by mouth 2 (two) times daily. 07/01/14   Cadarius Nevares Bunnie Pion, NP  cyclobenzaprine  (FLEXERIL) 10 MG tablet Take 1 tablet (10 mg total) by mouth 2 (two) times daily as needed for muscle spasms. 07/01/14   Rihan Schueler Bunnie Pion, NP  diclofenac (VOLTAREN) 50 MG EC tablet Take 1 tablet (50 mg total) by mouth 2 (two) times daily. 07/01/14   Jeffie Widdowson Bunnie Pion, NP  HYDROcodone-acetaminophen (NORCO) 5-325 MG per tablet Take 1 tablet by mouth every 6 (six) hours as needed for moderate pain. 07/01/14   Kerra Guilfoil Bunnie Pion, NP   BP 119/79 mmHg  Pulse 80  Temp(Src) 98 F (36.7 C) (Oral)  Resp 18  SpO2 95%  LMP 02/27/2011 Physical Exam  Constitutional: She is oriented to person, place, and time. She appears well-developed and  well-nourished.  HENT:  Head: Normocephalic and atraumatic.  Right Ear: Tympanic membrane normal.  Left Ear: Tympanic membrane normal.  Eyes: Conjunctivae and EOM are normal. Pupils are equal, round, and reactive to light.  Neck: Normal range of motion. Neck supple.  Cardiovascular: Normal rate, regular rhythm and normal heart sounds.   Pulses:      Radial pulses are 2+ on the right side, and 2+ on the left side.  Pulmonary/Chest: Effort normal. No respiratory distress. She has wheezes ( expiratory ).  Abdominal: Soft. Bowel sounds are normal. She exhibits no distension. There is tenderness ( suprapubic and lower quadrant ). There is no rebound and no guarding.  Genitourinary:  No CVA tenderness. Pelvic exam: external genitalia without lesions. White malodorous d/c vaginal vault. No CMT, no adnexal tenderness, unable to palpate uterus due to patient habitus.   Musculoskeletal: Normal range of motion. She exhibits tenderness.       Lumbar back: She exhibits tenderness. Decreased range of motion: due ot pain.  FROM. Lumbo-sacral pain bilaterally. No pain over the spinal column. Steady gait. No foot drag.    Neurological: She is alert and oriented to person, place, and time. She has normal strength. No sensory deficit. Gait normal.  Reflex Scores:      Bicep reflexes are 2+ on the  right side and 2+ on the left side.      Brachioradialis reflexes are 2+ on the right side and 2+ on the left side.      Patellar reflexes are 2+ on the right side and 2+ on the left side. Grips equal bilaterally.  Skin: Skin is warm and dry.  Psychiatric: She has a normal mood and affect. Her behavior is normal.  Nursing note and vitals reviewed.   ED Course  Procedures (including critical care time) DIAGNOSTIC STUDIES: Oxygen Saturation is 95% on RA, adequate by my interpretation.    COORDINATION OF CARE: 3:00 PM Discussed treatment plan with pt at bedside and pt agreed to plan.   Labs Review Results for orders placed or performed during the hospital encounter of 07/01/14 (from the past 48 hour(s))  GC/Chlamydia probe amp ([place])not at [facility]     Status: None   Collection Time: 07/01/14 12:00 AM  Result Value Ref Range   Chlamydia Negative     Comment: Normal Reference Range - Negative   Neisseria gonorrhea Negative     Comment: Normal Reference Range - Negative  Urinalysis, Routine w reflex microscopic-may I&O cath if menses (not at [facility])     Status: Abnormal   Collection Time: 07/01/14  2:42 PM  Result Value Ref Range   Color, Urine YELLOW YELLOW   APPearance CLOUDY (A) CLEAR   Specific Gravity, Urine 1.018 1.005 - 1.030   pH 8.0 5.0 - 8.0   Glucose, UA NEGATIVE NEGATIVE mg/dL   Hgb urine dipstick NEGATIVE NEGATIVE   Bilirubin Urine NEGATIVE NEGATIVE   Ketones, ur NEGATIVE NEGATIVE mg/dL   Protein, ur NEGATIVE NEGATIVE mg/dL   Urobilinogen, UA 0.2 0.0 - 1.0 mg/dL   Nitrite NEGATIVE NEGATIVE   Leukocytes, UA SMALL (A) NEGATIVE  Urine microscopic-add on     Status: Abnormal   Collection Time: 07/01/14  2:42 PM  Result Value Ref Range   Squamous Epithelial / LPF RARE RARE   WBC, UA 11-20 <3 WBC/hpf   RBC / HPF 0-2 <3 RBC/hpf   Bacteria, UA MANY (A) RARE  Wet prep, genital     Status: Abnormal     Collection Time: 07/01/14  3:28 PM  Result Value Ref Range   Yeast  Wet Prep HPF POC NONE SEEN NONE SEEN   Trich, Wet Prep NONE SEEN NONE SEEN   Clue Cells Wet Prep HPF POC NONE SEEN NONE SEEN   WBC, Wet Prep HPF POC FEW (A) NONE SEEN  CBC with Differential/Platelet     Status: Abnormal   Collection Time: 07/01/14  4:59 PM  Result Value Ref Range   WBC 4.8 4.0 - 10.5 K/uL   RBC 4.41 3.87 - 5.11 MIL/uL   Hemoglobin 13.4 12.0 - 15.0 g/dL   HCT 40.7 36.0 - 46.0 %   MCV 92.3 78.0 - 100.0 fL   MCH 30.4 26.0 - 34.0 pg   MCHC 32.9 30.0 - 36.0 g/dL   RDW 14.8 11.5 - 15.5 %   Platelets 142 (L) 150 - 400 K/uL   Neutrophils Relative % 44 43 - 77 %   Neutro Abs 2.1 1.7 - 7.7 K/uL   Lymphocytes Relative 47 (H) 12 - 46 %   Lymphs Abs 2.3 0.7 - 4.0 K/uL   Monocytes Relative 6 3 - 12 %   Monocytes Absolute 0.3 0.1 - 1.0 K/uL   Eosinophils Relative 3 0 - 5 %   Eosinophils Absolute 0.1 0.0 - 0.7 K/uL   Basophils Relative 0 0 - 1 %   Basophils Absolute 0.0 0.0 - 0.1 K/uL  Comprehensive metabolic panel     Status: Abnormal   Collection Time: 07/01/14  4:59 PM  Result Value Ref Range   Sodium 138 135 - 145 mmol/L   Potassium 4.5 3.5 - 5.1 mmol/L   Chloride 101 101 - 111 mmol/L   CO2 30 22 - 32 mmol/L   Glucose, Bld 112 (H) 65 - 99 mg/dL   BUN 10 6 - 20 mg/dL   Creatinine, Ser 1.10 (H) 0.44 - 1.00 mg/dL   Calcium 8.8 (L) 8.9 - 10.3 mg/dL   Total Protein 6.6 6.5 - 8.1 g/dL   Albumin 3.1 (L) 3.5 - 5.0 g/dL   AST 14 (L) 15 - 41 U/L   ALT 13 (L) 14 - 54 U/L   Alkaline Phosphatase 99 38 - 126 U/L   Total Bilirubin 0.6 0.3 - 1.2 mg/dL   GFR calc non Af Amer 58 (L) >60 mL/min   GFR calc Af Amer >60 >60 mL/min    Comment: (NOTE) The eGFR has been calculated using the CKD EPI equation. This calculation has not been validated in all clinical situations. eGFR's persistently <60 mL/min signify possible Chronic Kidney Disease.    Anion gap 7 5 - 15      MDM  49 y.o. female with multiple complains on this ED visit. Stable for d/c without focal neuro deficits.  Will treat her pain and UTI. She will try to see her regular doctor for follow up. Discussed with the patient and all questioned fully answered. She she voices understanding and agrees with plan.   Final diagnoses:  UTI (lower urinary tract infection)  Chronic back pain   I personally performed the services described in this documentation, which was scribed in my presence. The recorded information has been reviewed and is accurate.   Hope M Neese, NP 07/02/14 2306  Anthony Allen, MD 07/08/14 2330 

## 2014-07-02 LAB — GC/CHLAMYDIA PROBE AMP (~~LOC~~) NOT AT ARMC
Chlamydia: NEGATIVE
Neisseria Gonorrhea: NEGATIVE

## 2014-07-04 LAB — URINE CULTURE: Culture: >=100000

## 2014-07-06 ENCOUNTER — Telehealth: Payer: Self-pay | Admitting: Emergency Medicine

## 2014-07-06 NOTE — Telephone Encounter (Signed)
Post ED Visit - Positive Culture Follow-up  Culture report reviewed by antimicrobial stewardship pharmacist: []  Wes Dulaney, Pharm.D., BCPS []  Heide Guile, Pharm.D., BCPS []  Alycia Rossetti, Pharm.D., BCPS []  [place], Pharm.D., BCPS, AAHIVP [x]  Legrand Como, Pharm.D., BCPS, AAHIVP []  Isac Sarna, Pharm.D., BCPS  Positive Urine culture Treated with Cipro, organism sensitive to the same and no further patient follow-up is required at this time.  Ernesta Amble 07/06/2014, 5:51 PM

## 2014-08-12 DIAGNOSIS — M92219 Osteochondrosis (juvenile) of carpal lunate [Kienbock], unspecified hand: Secondary | ICD-10-CM

## 2014-08-12 HISTORY — DX: Osteochondrosis (juvenile) of carpal lunate (kienbock), unspecified hand: M92.219

## 2014-08-27 ENCOUNTER — Encounter (HOSPITAL_BASED_OUTPATIENT_CLINIC_OR_DEPARTMENT_OTHER): Payer: Self-pay | Admitting: *Deleted

## 2014-08-27 NOTE — Pre-Procedure Instructions (Signed)
To come for BMET 

## 2014-08-28 ENCOUNTER — Encounter (HOSPITAL_BASED_OUTPATIENT_CLINIC_OR_DEPARTMENT_OTHER)
Admission: RE | Admit: 2014-08-28 | Discharge: 2014-08-28 | Disposition: A | Discharge status: Home / Self Care | Payer: Medicaid Other | Source: Ambulatory Visit | Attending: Orthopedic Surgery | Admitting: Orthopedic Surgery

## 2014-08-28 DIAGNOSIS — M931 Kienbock's disease of adults: Secondary | ICD-10-CM | POA: Diagnosis not present

## 2014-08-28 DIAGNOSIS — M479 Spondylosis, unspecified: Secondary | ICD-10-CM | POA: Diagnosis not present

## 2014-08-28 DIAGNOSIS — K219 Gastro-esophageal reflux disease without esophagitis: Secondary | ICD-10-CM | POA: Diagnosis not present

## 2014-08-28 DIAGNOSIS — J45909 Unspecified asthma, uncomplicated: Secondary | ICD-10-CM | POA: Diagnosis not present

## 2014-08-28 DIAGNOSIS — M170 Bilateral primary osteoarthritis of knee: Secondary | ICD-10-CM | POA: Diagnosis not present

## 2014-08-28 DIAGNOSIS — Z79899 Other long term (current) drug therapy: Secondary | ICD-10-CM | POA: Diagnosis not present

## 2014-08-28 DIAGNOSIS — K2270 Barrett's esophagus without dysplasia: Secondary | ICD-10-CM | POA: Diagnosis not present

## 2014-08-28 DIAGNOSIS — F319 Bipolar disorder, unspecified: Secondary | ICD-10-CM | POA: Diagnosis not present

## 2014-08-28 DIAGNOSIS — Z8614 Personal history of Methicillin resistant Staphylococcus aureus infection: Secondary | ICD-10-CM | POA: Diagnosis not present

## 2014-08-28 DIAGNOSIS — Z8711 Personal history of peptic ulcer disease: Secondary | ICD-10-CM | POA: Diagnosis not present

## 2014-08-28 DIAGNOSIS — M19042 Primary osteoarthritis, left hand: Secondary | ICD-10-CM | POA: Diagnosis not present

## 2014-08-28 DIAGNOSIS — F419 Anxiety disorder, unspecified: Secondary | ICD-10-CM | POA: Diagnosis not present

## 2014-08-28 DIAGNOSIS — L409 Psoriasis, unspecified: Secondary | ICD-10-CM | POA: Diagnosis not present

## 2014-08-28 DIAGNOSIS — E119 Type 2 diabetes mellitus without complications: Secondary | ICD-10-CM | POA: Diagnosis not present

## 2014-08-28 DIAGNOSIS — Z885 Allergy status to narcotic agent status: Secondary | ICD-10-CM | POA: Diagnosis not present

## 2014-08-28 DIAGNOSIS — M160 Bilateral primary osteoarthritis of hip: Secondary | ICD-10-CM | POA: Diagnosis not present

## 2014-08-28 DIAGNOSIS — K589 Irritable bowel syndrome without diarrhea: Secondary | ICD-10-CM | POA: Diagnosis not present

## 2014-08-28 DIAGNOSIS — F17210 Nicotine dependence, cigarettes, uncomplicated: Secondary | ICD-10-CM | POA: Diagnosis not present

## 2014-08-28 DIAGNOSIS — Z880 Allergy status to penicillin: Secondary | ICD-10-CM | POA: Diagnosis not present

## 2014-08-28 DIAGNOSIS — G43109 Migraine with aura, not intractable, without status migrainosus: Secondary | ICD-10-CM | POA: Diagnosis not present

## 2014-08-28 DIAGNOSIS — M19041 Primary osteoarthritis, right hand: Secondary | ICD-10-CM | POA: Diagnosis not present

## 2014-08-28 LAB — BASIC METABOLIC PANEL
Anion gap: 5 (ref 5–15)
BUN: 8 mg/dL (ref 6–20)
CO2: 31 mmol/L (ref 22–32)
Calcium: 8.9 mg/dL (ref 8.9–10.3)
Chloride: 104 mmol/L (ref 101–111)
Creatinine, Ser: 1.12 mg/dL — ABNORMAL HIGH (ref 0.44–1.00)
GFR calc Af Amer: >60 mL/min (ref >60)
GFR calc non Af Amer: 57 mL/min — ABNORMAL LOW (ref >60)
Glucose, Bld: 88 mg/dL (ref 65–99)
Potassium: 4.4 mmol/L (ref 3.5–5.1)
Sodium: 140 mmol/L (ref 135–145)

## 2014-08-29 ENCOUNTER — Ambulatory Visit (HOSPITAL_BASED_OUTPATIENT_CLINIC_OR_DEPARTMENT_OTHER): Payer: Medicaid Other | Admitting: Anesthesiology

## 2014-08-29 ENCOUNTER — Encounter (HOSPITAL_BASED_OUTPATIENT_CLINIC_OR_DEPARTMENT_OTHER): Payer: Self-pay

## 2014-08-29 ENCOUNTER — Encounter (HOSPITAL_BASED_OUTPATIENT_CLINIC_OR_DEPARTMENT_OTHER)
Admission: RE | Disposition: A | Discharge status: Home / Self Care | Payer: Self-pay | Source: Ambulatory Visit | Attending: Orthopedic Surgery

## 2014-08-29 ENCOUNTER — Ambulatory Visit (HOSPITAL_BASED_OUTPATIENT_CLINIC_OR_DEPARTMENT_OTHER)
Admission: RE | Admit: 2014-08-29 | Discharge: 2014-08-29 | Disposition: A | Discharge status: Home / Self Care | Payer: Medicaid Other | Source: Ambulatory Visit | Attending: Orthopedic Surgery | Admitting: Orthopedic Surgery

## 2014-08-29 DIAGNOSIS — J45909 Unspecified asthma, uncomplicated: Secondary | ICD-10-CM | POA: Insufficient documentation

## 2014-08-29 DIAGNOSIS — K589 Irritable bowel syndrome without diarrhea: Secondary | ICD-10-CM | POA: Insufficient documentation

## 2014-08-29 DIAGNOSIS — K2270 Barrett's esophagus without dysplasia: Secondary | ICD-10-CM | POA: Insufficient documentation

## 2014-08-29 DIAGNOSIS — M931 Kienbock's disease of adults: Secondary | ICD-10-CM | POA: Insufficient documentation

## 2014-08-29 DIAGNOSIS — K219 Gastro-esophageal reflux disease without esophagitis: Secondary | ICD-10-CM | POA: Diagnosis not present

## 2014-08-29 DIAGNOSIS — M19042 Primary osteoarthritis, left hand: Secondary | ICD-10-CM | POA: Insufficient documentation

## 2014-08-29 DIAGNOSIS — Z880 Allergy status to penicillin: Secondary | ICD-10-CM | POA: Insufficient documentation

## 2014-08-29 DIAGNOSIS — M19041 Primary osteoarthritis, right hand: Secondary | ICD-10-CM | POA: Insufficient documentation

## 2014-08-29 DIAGNOSIS — M479 Spondylosis, unspecified: Secondary | ICD-10-CM | POA: Insufficient documentation

## 2014-08-29 DIAGNOSIS — E119 Type 2 diabetes mellitus without complications: Secondary | ICD-10-CM | POA: Insufficient documentation

## 2014-08-29 DIAGNOSIS — Z8711 Personal history of peptic ulcer disease: Secondary | ICD-10-CM | POA: Insufficient documentation

## 2014-08-29 DIAGNOSIS — Z79899 Other long term (current) drug therapy: Secondary | ICD-10-CM | POA: Insufficient documentation

## 2014-08-29 DIAGNOSIS — M170 Bilateral primary osteoarthritis of knee: Secondary | ICD-10-CM | POA: Insufficient documentation

## 2014-08-29 DIAGNOSIS — F17210 Nicotine dependence, cigarettes, uncomplicated: Secondary | ICD-10-CM | POA: Insufficient documentation

## 2014-08-29 DIAGNOSIS — M160 Bilateral primary osteoarthritis of hip: Secondary | ICD-10-CM | POA: Insufficient documentation

## 2014-08-29 DIAGNOSIS — Z885 Allergy status to narcotic agent status: Secondary | ICD-10-CM | POA: Insufficient documentation

## 2014-08-29 DIAGNOSIS — F319 Bipolar disorder, unspecified: Secondary | ICD-10-CM | POA: Insufficient documentation

## 2014-08-29 DIAGNOSIS — L409 Psoriasis, unspecified: Secondary | ICD-10-CM | POA: Insufficient documentation

## 2014-08-29 DIAGNOSIS — Z8614 Personal history of Methicillin resistant Staphylococcus aureus infection: Secondary | ICD-10-CM | POA: Insufficient documentation

## 2014-08-29 DIAGNOSIS — G43109 Migraine with aura, not intractable, without status migrainosus: Secondary | ICD-10-CM | POA: Insufficient documentation

## 2014-08-29 DIAGNOSIS — F419 Anxiety disorder, unspecified: Secondary | ICD-10-CM | POA: Insufficient documentation

## 2014-08-29 HISTORY — DX: Osteochondrosis (juvenile) of carpal lunate (kienbock), unspecified hand: M92.219

## 2014-08-29 HISTORY — DX: Personal history of other diseases of urinary system: Z87.448

## 2014-08-29 HISTORY — PX: WRIST FUSION: SHX839

## 2014-08-29 HISTORY — DX: Personal history of other diseases of the digestive system: Z87.19

## 2014-08-29 HISTORY — DX: Personal history of peptic ulcer disease: Z87.11

## 2014-08-29 HISTORY — DX: Migraine with aura, not intractable, without status migrainosus: G43.109

## 2014-08-29 HISTORY — DX: Personal history of Methicillin resistant Staphylococcus aureus infection: Z86.14

## 2014-08-29 LAB — GLUCOSE, CAPILLARY: Glucose-Capillary: 141 mg/dL — ABNORMAL HIGH (ref 65–99)

## 2014-08-29 LAB — POCT HEMOGLOBIN-HEMACUE: Hemoglobin: 14.8 g/dL (ref 12.0–15.0)

## 2014-08-29 SURGERY — FUSION, JOINT, WRIST
Anesthesia: Monitor Anesthesia Care | Site: Wrist | Laterality: Left

## 2014-08-29 MED ORDER — VANCOMYCIN HCL 10 G IV SOLR
1500.0000 mg | INTRAVENOUS | Status: AC
  Administered 2014-08-29: 1500 mg via INTRAVENOUS

## 2014-08-29 MED ORDER — ONDANSETRON HCL 4 MG/2ML IJ SOLN
INTRAMUSCULAR | Status: DC | PRN
  Administered 2014-08-29: 4 mg via INTRAVENOUS

## 2014-08-29 MED ORDER — FENTANYL CITRATE (PF) 100 MCG/2ML IJ SOLN
INTRAMUSCULAR | Status: AC
  Filled 2014-08-29: qty 2

## 2014-08-29 MED ORDER — SCOPOLAMINE 1 MG/3DAYS TD PT72: 1.0000 | MEDICATED_PATCH | Freq: Once | TRANSDERMAL | Status: DC | PRN

## 2014-08-29 MED ORDER — VANCOMYCIN HCL IN DEXTROSE 500-5 MG/100ML-% IV SOLN
INTRAVENOUS | Status: AC
  Filled 2014-08-29: qty 100

## 2014-08-29 MED ORDER — FENTANYL CITRATE (PF) 100 MCG/2ML IJ SOLN
INTRAMUSCULAR | Status: AC
  Filled 2014-08-29: qty 4

## 2014-08-29 MED ORDER — MEPERIDINE HCL 25 MG/ML IJ SOLN
6.2500 mg | INTRAMUSCULAR | Status: DC | PRN
Start: 2014-08-29 — End: 2014-08-29

## 2014-08-29 MED ORDER — PROMETHAZINE HCL 25 MG/ML IJ SOLN: 6.2500 mg | INTRAMUSCULAR | Status: DC | PRN

## 2014-08-29 MED ORDER — OXYCODONE-ACETAMINOPHEN 5-325 MG PO TABS: ORAL_TABLET | ORAL | Status: DC

## 2014-08-29 MED ORDER — MIDAZOLAM HCL 2 MG/2ML IJ SOLN
INTRAMUSCULAR | Status: AC
  Filled 2014-08-29: qty 2

## 2014-08-29 MED ORDER — OXYCODONE HCL 5 MG PO TABS
5.0000 mg | ORAL_TABLET | Freq: Once | ORAL | Status: DC | PRN
Start: 2014-08-29 — End: 2014-08-29

## 2014-08-29 MED ORDER — LIDOCAINE HCL (CARDIAC) 20 MG/ML IV SOLN
INTRAVENOUS | Status: DC | PRN
  Administered 2014-08-29: 50 mg via INTRAVENOUS

## 2014-08-29 MED ORDER — OXYCODONE HCL 5 MG/5ML PO SOLN: 5.0000 mg | Freq: Once | ORAL | Status: DC | PRN

## 2014-08-29 MED ORDER — GLYCOPYRROLATE 0.2 MG/ML IJ SOLN: 0.2000 mg | Freq: Once | INTRAMUSCULAR | Status: DC | PRN

## 2014-08-29 MED ORDER — LORAZEPAM 2 MG/ML IJ SOLN: 1.0000 mg | INTRAMUSCULAR | Status: DC | PRN

## 2014-08-29 MED ORDER — MIDAZOLAM HCL 2 MG/2ML IJ SOLN
1.0000 mg | INTRAMUSCULAR | Status: DC | PRN
  Administered 2014-08-29 (×2): 2 mg via INTRAVENOUS

## 2014-08-29 MED ORDER — LACTATED RINGERS IV SOLN
INTRAVENOUS | Status: DC
  Administered 2014-08-29 (×2): via INTRAVENOUS

## 2014-08-29 MED ORDER — MIDAZOLAM HCL 2 MG/2ML IJ SOLN
INTRAMUSCULAR | Status: AC
Start: 2014-08-29 — End: 2014-08-29
  Filled 2014-08-29: qty 2

## 2014-08-29 MED ORDER — PROPOFOL 10 MG/ML IV BOLUS
INTRAVENOUS | Status: DC | PRN
  Administered 2014-08-29: 150 mg via INTRAVENOUS
  Administered 2014-08-29: 70 mg via INTRAVENOUS

## 2014-08-29 MED ORDER — VANCOMYCIN HCL IN DEXTROSE 1-5 GM/200ML-% IV SOLN
INTRAVENOUS | Status: AC
  Filled 2014-08-29: qty 200

## 2014-08-29 MED ORDER — PROPOFOL INFUSION 10 MG/ML OPTIME
INTRAVENOUS | Status: DC | PRN
  Administered 2014-08-29: 100 ug/kg/min via INTRAVENOUS

## 2014-08-29 MED ORDER — FENTANYL CITRATE (PF) 100 MCG/2ML IJ SOLN
50.0000 ug | INTRAMUSCULAR | Status: AC | PRN
  Administered 2014-08-29: 50 ug via INTRAVENOUS
  Administered 2014-08-29: 100 ug via INTRAVENOUS
  Administered 2014-08-29 (×3): 50 ug via INTRAVENOUS

## 2014-08-29 MED ORDER — CHLORHEXIDINE GLUCONATE 4 % EX LIQD: 60.0000 mL | Freq: Once | CUTANEOUS | Status: DC

## 2014-08-29 MED ORDER — HYDROMORPHONE HCL 1 MG/ML IJ SOLN: 0.2500 mg | INTRAMUSCULAR | Status: DC | PRN

## 2014-08-29 MED ORDER — BUPIVACAINE-EPINEPHRINE (PF) 0.5% -1:200000 IJ SOLN
INTRAMUSCULAR | Status: DC | PRN
  Administered 2014-08-29: 30 mL via PERINEURAL

## 2014-08-29 SURGICAL SUPPLY — 68 items
BANDAGE ELASTIC 3 VELCRO ST LF (GAUZE/BANDAGES/DRESSINGS) ×2 IMPLANT
BIT DRILL 2.8 QUICK RELEASE (BIT) ×1 IMPLANT
BIT DRILL 2X3.5 HAND QK RELEAS (BIT) ×1 IMPLANT
BIT DRILL 2X3.5 QUICK RELEASE (BIT) ×2
BLADE MINI RND TIP GREEN BEAV (BLADE) ×2 IMPLANT
BLADE SURG 15 STRL LF DISP TIS (BLADE) ×2 IMPLANT
BLADE SURG 15 STRL SS (BLADE) ×2
BNDG CONFORM 3 STRL LF (GAUZE/BANDAGES/DRESSINGS) IMPLANT
BNDG ELASTIC 2 VLCR STRL LF (GAUZE/BANDAGES/DRESSINGS) IMPLANT
BNDG ESMARK 4X9 LF (GAUZE/BANDAGES/DRESSINGS) ×2 IMPLANT
BNDG GAUZE ELAST 4 BULKY (GAUZE/BANDAGES/DRESSINGS) ×2 IMPLANT
BONE MATRIX TRINITY 5CC (Bone Implant) ×2 IMPLANT
BUR FAST CUTTING MED (BURR) IMPLANT
BUR ROUND CARBIDE (BURR) IMPLANT
CHLORAPREP W/TINT 26ML (MISCELLANEOUS) ×2 IMPLANT
CORDS BIPOLAR (ELECTRODE) ×2 IMPLANT
COVER BACK TABLE 60X90IN (DRAPES) ×2 IMPLANT
COVER MAYO STAND STRL (DRAPES) ×2 IMPLANT
DRAPE EXTREMITY T 121X128X90 (DRAPE) ×2 IMPLANT
DRAPE OEC MINIVIEW 54X84 (DRAPES) ×2 IMPLANT
DRAPE SURG 17X23 STRL (DRAPES) ×2 IMPLANT
DRILL 2.8 QUICK RELEASE (BIT) ×2
GAUZE SPONGE 4X4 12PLY STRL (GAUZE/BANDAGES/DRESSINGS) ×2 IMPLANT
GAUZE XEROFORM 1X8 LF (GAUZE/BANDAGES/DRESSINGS) ×2 IMPLANT
GLOVE BIO SURGEON STRL SZ 6.5 (GLOVE) ×2 IMPLANT
GLOVE BIO SURGEON STRL SZ7.5 (GLOVE) ×2 IMPLANT
GLOVE BIOGEL PI IND STRL 7.0 (GLOVE) ×2 IMPLANT
GLOVE BIOGEL PI IND STRL 8 (GLOVE) ×1 IMPLANT
GLOVE BIOGEL PI INDICATOR 7.0 (GLOVE) ×2
GLOVE BIOGEL PI INDICATOR 8 (GLOVE) ×1
GOWN STRL REUS W/ TWL LRG LVL3 (GOWN DISPOSABLE) ×1 IMPLANT
GOWN STRL REUS W/TWL LRG LVL3 (GOWN DISPOSABLE) ×1
NEEDLE HYPO 22GX1.5 SAFETY (NEEDLE) IMPLANT
NEEDLE HYPO 25X1 1.5 SAFETY (NEEDLE) IMPLANT
NS IRRIG 1000ML POUR BTL (IV SOLUTION) ×2 IMPLANT
PACK BASIN DAY SURGERY FS (CUSTOM PROCEDURE TRAY) ×2 IMPLANT
PAD CAST 3X4 CTTN HI CHSV (CAST SUPPLIES) ×1 IMPLANT
PAD CAST 4YDX4 CTTN HI CHSV (CAST SUPPLIES) ×1 IMPLANT
PADDING CAST ABS 4INX4YD NS (CAST SUPPLIES) ×1
PADDING CAST ABS COTTON 4X4 ST (CAST SUPPLIES) ×1 IMPLANT
PADDING CAST COTTON 3X4 STRL (CAST SUPPLIES) ×1
PADDING CAST COTTON 4X4 STRL (CAST SUPPLIES) ×1
PLATE TOTAL WRIST FUSION (Plate) ×2 IMPLANT
SCREW BONE LOCK 2.3X18MM HEXA (Screw) ×1 IMPLANT
SCREW CORTICAL 3.5X20MM (Screw) ×2 IMPLANT
SCREW HEXALOBE NON-LOCK 3.5X14 (Screw) ×4 IMPLANT
SCREW LOCK 18X3.5X HEXALOBE (Screw) ×1 IMPLANT
SCREW LOCK 2.3X18 (Screw) ×2 IMPLANT
SCREW LOCK HEX MULTI 2.3X11 (Screw) ×4 IMPLANT
SCREW LOCK HEX MULTI 2.3X12 (Screw) ×2 IMPLANT
SCREW LOCK HEX MULTI 2.3X13 (Screw) ×6 IMPLANT
SCREW LOCKING 3.5X18MM (Screw) ×1 IMPLANT
SCREW NON LOCKING HEX 3.5X18MM (Screw) ×2 IMPLANT
SLEEVE SCD COMPRESS KNEE MED (MISCELLANEOUS) ×2 IMPLANT
SPLINT PLASTER CAST XFAST 4X15 (CAST SUPPLIES) IMPLANT
SPLINT PLASTER XTRA FAST SET 4 (CAST SUPPLIES)
STOCKINETTE 4X48 STRL (DRAPES) ×2 IMPLANT
SUCTION FRAZIER TIP 10 FR DISP (SUCTIONS) IMPLANT
SUT ETHILON 3 0 PS 1 (SUTURE) IMPLANT
SUT ETHILON 4 0 PS 2 18 (SUTURE) ×2 IMPLANT
SUT VIC AB 3-0 PS1 18 (SUTURE)
SUT VIC AB 3-0 PS1 18XBRD (SUTURE) IMPLANT
SUT VICRYL 4-0 PS2 18IN ABS (SUTURE) ×2 IMPLANT
SYR BULB 3OZ (MISCELLANEOUS) ×2 IMPLANT
SYR CONTROL 10ML LL (SYRINGE) IMPLANT
TOWEL OR 17X24 6PK STRL BLUE (TOWEL DISPOSABLE) ×4 IMPLANT
TUBE CONNECTING 20X1/4 (TUBING) IMPLANT
UNDERPAD 30X30 (UNDERPADS AND DIAPERS) ×2 IMPLANT

## 2014-08-29 NOTE — H&P (Signed)
Michelle [name] is an 49 y.o. female.   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  Has had continued pain in left wrist over a year with worsening over last six months.  It is difficult for her to perform her activities.  She wishes to have a left wrist fusion for management of pain.  Past Medical History  Diagnosis Date  . GERD (gastroesophageal reflux disease)   . Depression   . Barrett's esophagus   . IBS (irritable bowel syndrome)   . DM (diabetes mellitus)     diet controlled  . Anxiety     panic attacts  . Bipolar disorder   . Migraine equivalent   . Arthritis     hips, knees, hands and spine  . Psoriasis     elbows, trunk  . History of gastric ulcer   . History of acute renal failure 2012    resolved  . History of MRSA infection 2014    back  . 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 Asthma     daily inhaler    Past Surgical History  Procedure Laterality Date  . Cervical fusion  1994; 2000  . Cholecystectomy  1991  . Carpal tunnel release Right 08/15/2009  . Carpal tunnel release  11/23/2010    Procedure: CARPAL TUNNEL RELEASE;  Surgeon: Meredith Pel;  Location: [place];  Service: Orthopedics;  Laterality: Left;  revision left carpal tunnel release neurolysis of median nerve  . Esophagogastroduodenoscopy  03/20/2011    Procedure: ESOPHAGOGASTRODUODENOSCOPY (EGD);  Surgeon: Irene Shipper, MD;  Location: [facility];  Service: Endoscopy;  Laterality: N/A;  Clarise Cruz /ja  . Knee arthroscopy Bilateral   . Carpectomy  10/05/2011    Procedure: CARPECTOMY;  Surgeon: Tennis Must, MD;  Location: [place];  Service: Orthopedics;  Laterality: Left;  left proximal row carpectomy bone grafting capitate and radial styloidectomy  . Tubal ligation  1991  . Total hip arthroplasty Right 06/01/2013    Procedure: POSTERIOR RIGHT TOTAL HIP ARTHROPLASTY;  Surgeon: Alta Corning, MD;  Location: [place];  Service: Orthopedics;  Laterality: Right;  . Multiple tooth extractions    . Carpal tunnel release Left 12/02/2009  . Esophagogastroduodenoscopy (egd) with esophageal dilation  05/10/2012    with Propofol  . Colonoscopy w/ polypectomy  06/14/2011    with Propofol  . Anterior lumbar fusion  12/05/2006    L5-S1  . Total knee arthroplasty Left 11/28/2007    Family History  Problem Relation Age of Onset  . Esophageal cancer Father   . Clotting disorder Daughter    Social History:  reports that she has been smoking Cigarettes.  She has a 16.5 pack-year smoking history. She has never used smokeless tobacco. She reports that she drinks alcohol. She reports that she does not use illicit drugs.  Allergies:  Allergies  Allergen Reactions  . Penicillins Itching, Nausea And Vomiting and Rash  . Codeine Itching    Medications Prior to Admission  Medication Sig Dispense Refill  . DULoxetine (CYMBALTA) 60 MG capsule Take 60 mg by mouth 2 (two) times daily.    . furosemide (LASIX) 40 MG tablet Take 40 mg by mouth.    . gabapentin (NEURONTIN) 600 MG tablet Take 600 mg by mouth 2 (two) times daily.    Marland Kitchen ibuprofen (ADVIL,MOTRIN) 200 MG tablet Take 200 mg by mouth every 6 (  six) hours as needed.    . Ipratropium-Albuterol (COMBIVENT RESPIMAT) 20-100 MCG/ACT AERS respimat Inhale 2 puffs into the lungs 3 (three) times daily as needed for wheezing.     . lamoTRIgine (LAMICTAL) 25 MG tablet Take 25 mg by mouth 2 (two) times daily.    Marland Kitchen omeprazole (PRILOSEC) 20 MG capsule Take 20 mg by mouth 2 (two) times daily before a meal.    . traZODone (DESYREL) 100 MG tablet Take 100 mg by mouth at bedtime.      Results for orders placed or performed during the hospital encounter of 08/29/14 (from the past 48 hour(s))  Basic metabolic panel     Status: Abnormal   Collection Time: 08/28/14  2:51 PM  Result Value Ref Range   Sodium 140 135 - 145 mmol/L   Potassium 4.4 3.5 - 5.1 mmol/L    Chloride 104 101 - 111 mmol/L   CO2 31 22 - 32 mmol/L   Glucose, Bld 88 65 - 99 mg/dL   BUN 8 6 - 20 mg/dL   Creatinine, Ser 1.12 (H) 0.44 - 1.00 mg/dL   Calcium 8.9 8.9 - 10.3 mg/dL   GFR calc non Af Amer 57 (L) >60 mL/min   GFR calc Af Amer >60 >60 mL/min    Comment: (NOTE) The eGFR has been calculated using the CKD EPI equation. This calculation has not been validated in all clinical situations. eGFR's persistently <60 mL/min signify possible Chronic Kidney Disease.    Anion gap 5 5 - 15    No results found.   A comprehensive review of systems was negative except for: Eyes: positive for contacts/glasses Respiratory: positive for asthma and cough Gastrointestinal: positive for ulcer Hematologic/lymphatic: positive for easy bruising Neurological: positive for headaches Behavioral/Psych: positive for anxiety and depression  Height '5\' 9"'  (1.753 m), weight 129.275 kg (285 lb), last menstrual period 02/27/2011.  General appearance: alert, cooperative and appears stated age Head: Normocephalic, without obvious abnormality, atraumatic Neck: supple, symmetrical, trachea midline Resp: clear to auscultation bilaterally Cardio: regular rate and rhythm GI: non tender Extremities: intact sensation and capillary refill all digits.  +epl/fpl/io.  no wounds.   Pulses: 2+ and symmetric Skin: Skin color, texture, turgor normal. No rashes or lesions Neurologic: Grossly normal Incision/Wound: none  Assessment/Plan Left wrist pain with degenerative changes s/p proximal row carpectomy for Kienbock's disease.  Non operative and operative treatment options were discussed with the patient and patient wishes to proceed with operative treatment. She wishes to have a left wrist fusion for management of pain.  Risks, benefits, and alternatives of surgery were discussed and the patient agrees with the plan of care.   Kemoni Ortega R 08/29/2014, 10:48 AM

## 2014-08-29 NOTE — Progress Notes (Signed)
Assisted Dr. Germeroth with left, ultrasound guided, axillary block. Side rails up, monitors on throughout procedure. See vital signs in flow sheet. Tolerated Procedure well. 

## 2014-08-29 NOTE — Op Note (Signed)
[id number] 

## 2014-08-29 NOTE — Brief Op Note (Signed)
08/29/2014  3:10 PM  PATIENT:  Michelle [name]  49 y.o. female  PRE-OPERATIVE DIAGNOSIS:  LEFT WRIST KIENBOCKS  POST-OPERATIVE DIAGNOSIS:  LEFT WRIST KIENBOCKS  PROCEDURE:  Procedure(s): LEFT WRIST ARTHRODESIS WRIST (Left), left long finger cmc arthrodesis  SURGEON:  Surgeon(s) and Role:    * Leanora Cover, MD - Primary    * Daryll Brod, MD - Assisting  PHYSICIAN ASSISTANT:   ASSISTANTS: Daryll Brod, MD   ANESTHESIA:   regional and general  EBL:  Total I/O In: 1300 [I.V.:1300] Out: -   BLOOD ADMINISTERED:none  DRAINS: none   LOCAL MEDICATIONS USED:  NONE  SPECIMEN:  No Specimen  DISPOSITION OF SPECIMEN:  N/A  COUNTS:  YES  TOURNIQUET:   Total Tourniquet Time Documented: Upper Arm (Left) - 97 minutes Total: Upper Arm (Left) - 97 minutes   DICTATION: .Other Dictation: Dictation Number [id number]  PLAN OF CARE: Discharge to home after PACU  PATIENT DISPOSITION:  PACU - hemodynamically stable.

## 2014-08-29 NOTE — Op Note (Signed)
Intra-operative fluoroscopic images in the AP, lateral, and oblique views were taken and evaluated by myself.  Reduction and hardware placement were confirmed.  There was no intraarticular penetration of permanent hardware.  

## 2014-08-29 NOTE — Discharge Instructions (Addendum)

## 2014-08-29 NOTE — Transfer of Care (Signed)
Immediate Anesthesia Transfer of Care Note  Patient: Michelle [name]  Procedure(s) Performed: Procedure(s): LEFT WRIST ARTHRODESIS WRIST (Left)  Patient Location: PACU  Anesthesia Type:General and GA combined with regional for post-op pain  Level of Consciousness: awake and alert   Airway & Oxygen Therapy: Patient Spontanous Breathing and Patient connected to face mask oxygen  Post-op Assessment: Report given to RN and Post -op Vital signs reviewed and stable  Post vital signs: Reviewed and stable  Last Vitals:  Filed Vitals:   08/29/14 1512  BP:   Pulse: 82  Temp:   Resp: 16    Complications: No apparent anesthesia complications

## 2014-08-29 NOTE — Anesthesia Preprocedure Evaluation (Addendum)
Anesthesia Evaluation  Patient identified by MRN, date of birth, ID band Patient awake    Reviewed: Allergy & Precautions, NPO status , Patient's Chart, lab work & pertinent test results  Airway Mallampati: II  TM Distance: >3 FB Neck ROM: Full    Dental no notable dental hx.    Pulmonary asthma , COPDCurrent Smoker,  breath sounds clear to auscultation  Pulmonary exam normal       Cardiovascular negative cardio ROS Normal cardiovascular examRhythm:Regular Rate:Normal     Neuro/Psych  Headaches, PSYCHIATRIC DISORDERS Anxiety Depression Bipolar Disorder    GI/Hepatic Neg liver ROS, GERD-  ,  Endo/Other  diabetes, Type 2  Renal/GU Renal disease     Musculoskeletal  (+) Arthritis -,   Abdominal   Peds  Hematology negative hematology ROS (+)   Anesthesia Other Findings   Reproductive/Obstetrics negative OB ROS                            Anesthesia Physical Anesthesia Plan  ASA: III  Anesthesia Plan: MAC and Regional   Post-op Pain Management:    Induction: Intravenous  Airway Management Planned:   Additional Equipment:   Intra-op Plan:   Post-operative Plan:   Informed Consent: I have reviewed the patients History and Physical, chart, labs and discussed the procedure including the risks, benefits and alternatives for the proposed anesthesia with the patient or authorized representative who has indicated his/her understanding and acceptance.   Dental advisory given  Plan Discussed with: CRNA  Anesthesia Plan Comments:         Anesthesia Quick Evaluation                                  Anesthesia Evaluation  Patient identified by MRN, date of birth, ID band Patient awake    Reviewed: Allergy & Precautions, H&P , NPO status , Patient's Chart, lab work & pertinent test results  Airway Mallampati: II TM Distance: >3 FB Neck ROM: Full    Dental  (+) Dental  Advisory Given, Partial Lower, Teeth Intact, Missing   Pulmonary asthma , Current Smoker,  breath sounds clear to auscultation        Cardiovascular Rhythm:Regular Rate:Normal     Neuro/Psych PSYCHIATRIC DISORDERS Anxiety Depression Bipolar Disorder    GI/Hepatic PUD, GERD-  Medicated,(+)     substance abuse  ,   Endo/Other  diabetes, Type 2  Renal/GU      Musculoskeletal  (+) Arthritis -,   Abdominal   Peds  Hematology   Anesthesia Other Findings   Reproductive/Obstetrics                        Anesthesia Physical Anesthesia Plan  ASA: III  Anesthesia Plan: General   Post-op Pain Management:    Induction: Intravenous  Airway Management Planned: Oral ETT  Additional Equipment:   Intra-op Plan:   Post-operative Plan: Extubation in OR  Informed Consent: I have reviewed the patients History and Physical, chart, labs and discussed the procedure including the risks, benefits and alternatives for the proposed anesthesia with the patient or authorized representative who has indicated his/her understanding and acceptance.   Dental advisory given  Plan Discussed with: CRNA, Anesthesiologist and Surgeon  Anesthesia Plan Comments:         Anesthesia Quick Evaluation

## 2014-08-29 NOTE — Anesthesia Procedure Notes (Addendum)
Anesthesia Regional Block:  Axillary brachial plexus block  Pre-Anesthetic Checklist: ,, timeout performed, Correct Patient, Correct Site, Correct Laterality, Correct Procedure, Correct Position, site marked, Risks and benefits discussed,  Surgical consent,  Pre-op evaluation,  At surgeon's request and post-op pain management  Laterality: Left  Prep: chloraprep       Needles:  Injection technique: Single-shot  Needle Type: Stimiplex     Needle Length: 9cm 9 cm Needle Gauge: 21 and 21 G    Additional Needles:  Procedures: ultrasound guided (picture in chart) Axillary brachial plexus block Narrative:   Performed by: Personally  Anesthesiologist: Nolon Nations  Additional Notes: Patient tolerated the procedure well without complications. Good perineural spread.   Procedure Name: LMA Insertion Date/Time: 08/29/2014 1:30 PM Performed by: Lieutenant Diego Pre-anesthesia Checklist: Patient identified, Emergency Drugs available, Suction available and Patient being monitored Patient Re-evaluated:Patient Re-evaluated prior to inductionOxygen Delivery Method: Circle System Utilized Preoxygenation: Pre-oxygenation with 100% oxygen Intubation Type: IV induction Ventilation: Mask ventilation without difficulty LMA: LMA inserted LMA Size: 4.0 Number of attempts: 1 Airway Equipment and Method: Bite block Placement Confirmation: positive ETCO2 and breath sounds checked- equal and bilateral Tube secured with: Tape Dental Injury: Teeth and Oropharynx as per pre-operative assessment  Comments: Blocked not adequate for MAC

## 2014-08-29 NOTE — Anesthesia Postprocedure Evaluation (Signed)
  Anesthesia Post-op Note  Patient: Michelle [name]  Procedure(s) Performed: Procedure(s) (LRB): LEFT WRIST ARTHRODESIS WRIST (Left)  Patient Location: PACU  Anesthesia Type: General  Level of Consciousness: awake and alert   Airway and Oxygen Therapy: Patient Spontanous Breathing  Post-op Pain: mild  Post-op Assessment: Post-op Vital signs reviewed, Patient's Cardiovascular Status Stable, Respiratory Function Stable, Patent Airway and No signs of Nausea or vomiting  Last Vitals:  Filed Vitals:   08/29/14 1607  BP: 132/88  Pulse: 62  Temp: 36.5 C  Resp: 16    Post-op Vital Signs: stable   Complications: No apparent anesthesia complications

## 2014-08-30 NOTE — Op Note (Signed)
NAME:  Michelle [name], Michelle [name]:  [id number]  MEDICAL RECORD NO.:  [id number]  LOCATION:                               FACILITY:  [facility]  PHYSICIAN:  Leanora Cover, MD        DATE OF BIRTH:  February 10, 1965  DATE OF PROCEDURE:  08/29/2014 DATE OF DISCHARGE:  08/29/2014                              OPERATIVE REPORT   PREOPERATIVE DIAGNOSIS:  Left Kienbock's disease, status post proximal row corpectomy with degeneration.  POSTOPERATIVE DIAGNOSIS:  Left Kienbock's disease, status post proximal row corpectomy with degeneration.  PROCEDURE:  Left wrist arthrodesis with arthrodesis of long finger CMC joint.  SURGEON:  Leanora Cover, M.D.  ASSISTANT:  Daryll Brod, M.D.  ANESTHESIA:  General with regional.  IV FLUIDS:  Per anesthesia flow sheet.  ESTIMATED BLOOD LOSS:  Minimal.  COMPLICATIONS:  None.  SPECIMENS:  None.  TOURNIQUET TIME:  97 minutes.  DISPOSITION:  Stable to PACU.  INDICATIONS:  Michelle [name] is a 49 year old female, who approximately 3 years ago, underwent proximal row corpectomy for Kienbock's disease of the left wrist.  Over the past year, she has had continued pain, worsening in the past 6 months.  It is very bothersome to her.  She states it interferes with her activities of daily living.  She wished to have a left wrist arthrodesis for management of pain.  Risks, benefits, and alternatives of surgery were discussed including risk of blood loss; infection; damage to nerves, vessels, tendons, ligaments, and bone; failure of surgery; need for additional surgery; complications with wound healing, continued pain, nonunion, malunion, and stiffness.  She voiced understanding of these risks and elected to proceed.  OPERATIVE COURSE:  After being identified preoperatively by myself, the patient and I agreed upon procedure and site of procedure.  Surgical site was marked.  The risks, benefits, and alternatives of surgery were reviewed and she wished to  proceed.  Surgical consent had been signed. She was given IV vancomycin as preoperative antibiotic prophylaxis due to penicillin allergy.  A regional block was performed by Anesthesia in the preoperative holding.  She was transferred to the operating room and placed on the operating room table in supine position with the left upper extremity on arm board.  General anesthesia was induced by Anesthesiology.  Left upper extremity was prepped and draped in normal sterile orthopedic fashion.  A surgical pause was performed between surgeons, anesthesia, and operating room staff, and all were in agreement as to the patient, procedure, and site of procedure. Tourniquet at the proximal aspect of the extremity was inflated to 250 mmHg after exsanguination of the limb with an Esmarch bandage.  The incision was made longitudinally in the dorsum of the wrist, including the previous incision.  This was carried into subcutaneous tissues by spreading technique.  Bipolar electrocautery was used to obtain hemostasis.  The fourth dorsal compartment was entered.  The third dorsal compartment was released in the EPL transposed.  The fourth dorsal compartment was elevated subperiosteally.  The joint was entered. There was complete degeneration of proximal pole of the capitate with fragmentation of the proximal pole.  There was a large cavity within the capitate as well.  This was  cleaned out using rongeurs and the curette, back to bleeding cancellous bone.  The radial articular surface was then prepared with the curette and rongeurs as well.  Holes were drilled in the subchondral bone, using the drill to aid in the arthrodesis.  The incision was extended onto the long finger metacarpal and the periosteum elevated.  The [facility] joint was entered and the articular surface denuded. The wound in joints were copiously irrigated with sterile saline by bulb syringe.  [facility] bone graft was then packed into the [facility] joint  and into the fusion site at the wrist, including into the cavity in the capitate. The Acumed Fusion Plate was placed onto the bone and its positioning confirmed.  It was secured distally first.  Standard AO drilling and measuring technique was used.  A single non-locking screw was placed in the slotted hole distally.  The 4 distal holes were then filled with locking screws.  The wrist was placed into extension to bring the plate down onto the bone of the distal radius.  Again standard AO drilling and measuring technique was used.  Nonlocking screws were placed here.  Very good purchase was obtained proximally in acceptable purchase distally. The screw hole into the capitate was filled with a locking screw.  This was drilled under C-arm guidance to ensure no penetrance of the volar aspect.  The C-arm was used in AP, lateral, and oblique projections to ensure appropriate reduction and position of hardware, which was the case.  There appeared to be good bony contact at the arthrodesis site as well as good bone graft packing.  The periosteum was repaired back over top of the plate as well as the joint capsule as best as possible using 2-0 Vicryl suture in a figure-of-eight fashion.  The subcutaneous tissues were closed with a 4-0 Vicryl suture in an inverted interrupted fashion.  The skin was closed with 4-0 nylon in a horizontal mattress fashion.  The wound was dressed with a sterile Xeroform, 4x4s, and wrapped with a Kerlix bandage.  A volar splint was placed and wrapped with Kerlix and Ace bandage.  The tourniquet was deflated at 97 minutes. The fingertips were pink with brisk capillary refill after deflation of the tourniquet.  The operative drapes were broken down and the patient was awoken from anesthesia safely.  She was transferred back to the stretcher and taken to PACU in stable condition.  I will see her back in the office in 1 week for postoperative followup.  I will give  her Percocet 10/325, 1-2 p.o. q.6 hours p.r.n. pain, dispensed #30.     Leanora Cover, MD     KK/MEDQ  D:  08/29/2014  T:  08/30/2014  Job:  [id number]

## 2014-09-03 ENCOUNTER — Encounter (HOSPITAL_BASED_OUTPATIENT_CLINIC_OR_DEPARTMENT_OTHER): Payer: Self-pay | Admitting: Orthopedic Surgery

## 2014-09-04 ENCOUNTER — Ambulatory Visit: Payer: Medicaid Other | Attending: Orthopedic Surgery | Admitting: Occupational Therapy

## 2014-09-04 ENCOUNTER — Encounter (HOSPITAL_BASED_OUTPATIENT_CLINIC_OR_DEPARTMENT_OTHER): Payer: Self-pay | Admitting: Orthopedic Surgery

## 2014-09-04 DIAGNOSIS — R531 Weakness: Secondary | ICD-10-CM

## 2014-09-04 DIAGNOSIS — M25642 Stiffness of left hand, not elsewhere classified: Secondary | ICD-10-CM | POA: Insufficient documentation

## 2014-09-04 DIAGNOSIS — M6281 Muscle weakness (generalized): Secondary | ICD-10-CM | POA: Diagnosis present

## 2014-09-04 DIAGNOSIS — M25532 Pain in left wrist: Secondary | ICD-10-CM | POA: Diagnosis not present

## 2014-09-04 DIAGNOSIS — M25632 Stiffness of left wrist, not elsewhere classified: Secondary | ICD-10-CM

## 2014-09-04 NOTE — Patient Instructions (Signed)
WEARING SCHEDULE:  ?Wear splint at ALL times except for hygiene care  ? ?PURPOSE:  ?To prevent movement and for protection until injury can heal ? ?CARE OF SPLINT:  ?Keep splint away from heat sources including: stove, radiator or furnace, or a car in sunlight. The splint can melt and will no longer fit you properly ? ?Keep away from pets and children ? ?Clean the splint with rubbing alcohol 1-2 times per day.  ?* During this time, make sure you also clean your hand/arm as instructed by your therapist and/or perform dressing changes as needed. Then dry hand/arm completely before replacing splint. (When cleaning hand/arm, keep it immobilized in same position until splint is replaced) ? ?PRECAUTIONS/POTENTIAL PROBLEMS: ?*If you notice or experience increased pain, swelling, numbness, or a lingering reddened area from the splint: ?Contact your therapist immediately by calling [phone]. You must wear the splint for protection, but we will get you scheduled for adjustments as quickly as possible.  ?(If only straps or hooks need to be replaced and NO adjustments to the splint need to be made, just call the office ahead and let them know you are coming in) ? ?If you have any medical concerns or signs of infection, please call your doctor immediately ?  ?

## 2014-09-05 NOTE — Therapy (Addendum)
[facility] [address] [place], [place], [address] Phone: [phone]   Fax:  [phone]  Occupational Therapy Evaluation  Patient Details  Name: Michelle [name] MRN: [id number] Date of Birth: [birth year] Referring Provider:  Leanora Cover, MD  Encounter Date: 09/04/2014      OT End of Session - 09/05/14 0759    Visit Number 1   Number of Visits 4   Date for OT Re-Evaluation 11/01/14   Authorization Type Medicaid   Authorization Time Period AWAIT AUTH   OT Start Time 1105   OT Stop Time 1235   OT Time Calculation (min) 90 min   Activity Tolerance Patient limited by pain   Behavior During Therapy Anxious      Past Medical History  Diagnosis Date  . GERD (gastroesophageal reflux disease)   . Depression   . Barrett's esophagus   . IBS (irritable bowel syndrome)   . DM (diabetes mellitus)     diet controlled  . Anxiety     panic attacts  . Bipolar disorder   . Migraine equivalent   . Arthritis     hips, knees, hands and spine  . Psoriasis     elbows, trunk  . History of gastric ulcer   . History of acute renal failure 2012    resolved  . History of MRSA infection 2014    back  . 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 Asthma     daily inhaler    Past Surgical History  Procedure Laterality Date  . Cervical fusion  1994; 2000  . Cholecystectomy  1991  . Carpal tunnel release Right 08/15/2009  . Carpal tunnel release  11/23/2010    Procedure: CARPAL TUNNEL RELEASE;  Surgeon: Meredith Pel;  Location: [facility];  Service: Orthopedics;  Laterality: Left;  revision left carpal tunnel release neurolysis of median nerve  . Esophagogastroduodenoscopy  03/20/2011    Procedure: ESOPHAGOGASTRODUODENOSCOPY (EGD);  Surgeon: Irene Shipper, MD;  Location: [facility] ENDOSCOPY;  Service: Endoscopy;  Laterality: N/A;  Clarise Cruz /ja  . Knee arthroscopy Bilateral   . Carpectomy  10/05/2011    Procedure: CARPECTOMY;  Surgeon:  Tennis Must, MD;  Location: [facility];  Service: Orthopedics;  Laterality: Left;  left proximal row carpectomy bone grafting capitate and radial styloidectomy  . Tubal ligation  1991  . Total hip arthroplasty Right 06/01/2013    Procedure: POSTERIOR RIGHT TOTAL HIP ARTHROPLASTY;  Surgeon: Alta Corning, MD;  Location: [facility];  Service: Orthopedics;  Laterality: Right;  . Multiple tooth extractions    . Carpal tunnel release Left 12/02/2009  . Esophagogastroduodenoscopy (egd) with esophageal dilation  05/10/2012    with Propofol  . Colonoscopy w/ polypectomy  06/14/2011    with Propofol  . Anterior lumbar fusion  12/05/2006    L5-S1  . Total knee arthroplasty Left 11/28/2007  . Wrist fusion Left 08/29/2014    Procedure: LEFT WRIST ARTHRODESIS WRIST;  Surgeon: Leanora Cover, MD;  Location: [facility];  Service: Orthopedics;  Laterality: Left;    There were no vitals filed for this visit.  Visit Diagnosis:  Pain in wrist joint, left  Stiffness of joint of left forearm  Stiffness of joint, hand, left  Decreased strength      Subjective Assessment - 09/04/14 1306    Subjective  Pt with Burgess Amor' disease,  s/p surgery 08/29/14 for left wrist arthrodesis and arthrodesis of long finger CMC  joint   Pertinent History see Epic snapshot   Patient Stated Goals splint   Currently in Pain? Yes   Pain Score 8    Pain Location Wrist   Pain Orientation Left   Pain Descriptors / Indicators Aching;Stabbing   Pain Type Chronic pain;Surgical pain   Pain Onset In the past 7 days   Aggravating Factors  movement   Pain Relieving Factors not moving   Multiple Pain Sites No           [facility] OT Assessment - 09/05/14 0001    Assessment   Diagnosis Pt with kienbock's disease, s/p arthrodesis of left wrist and long finger CMC on 08/29/14 .   Onset Date 08/29/14   Assessment Pt presents    Prior Therapy OT   Precautions   Precautions Other (comment)   Precaution  Comments splint at all times except for hand hygeine, no use of LUE   Home  Environment   Family/patient expects to be discharged to: Private residence   Lives With Family   ADL   ADL comments Pt is using RUE for ADL performance. Pt lives with her mother who is able to assist.   Mobility   Mobility Status Independent   Edema   Edema moderate edema in left hand. Pt arrived in protective cast, she was unwrapped and hand was cleaned with soap and water. Incision on dorsal hand/ wrist with stitches in place was dressed with 4x4, gauze and stockinette.. Therapist reviewed s/s of infection, with patient. Mild irritation at top of incision, with light pink color, incision does not appear to have infection. Patient was instructed to call MD if office if she develops s/s of infection. Pt sees MD for follow up Friday 09/06/14.                  OT Treatments/Exercises (OP) - 09/05/14 0001    Splinting   Splinting Pt was fitted with a bivalve clamshell forearm brace with wrist in neutral position, digits and thumb free.  Clamshell brace was per Dr. Levell July instructions.               OT Education - 09/05/14 1046    Education provided Yes   Education Details splint wear, care and precautions, hand hygeine and s/s of infection   Person(s) Educated Patient   Methods Explanation;Demonstration;Verbal cues;Handout   Comprehension Verbalized understanding;Returned demonstration             OT Long Term Goals - 09/04/14 1310    OT LONG TERM GOAL #1   Title I with splint wear care and precautions following 1-2 weeks of wear to ensure proper fit.   Baseline dependent splint issued on inital visit   Time 8   Period Weeks   Status New   OT LONG TERM GOAL #2   Title I with HEP    Baseline dependent   Time 8   Period Weeks   Status New   OT LONG TERM GOAL #3   Title Pt will verbalize understanding of pain management/edema control techniques   Baseline dependent   Time 8    Period Weeks   Status New                       --               Plan - 09/05/14 6160    Clinical Impression Statement Pt with Keinbock's disease, s/p left wrist and long  finger CMC joint arthrodesis([id number], [id number], [id number]) 08/29/14 presents with pain, edema and decreased A/ROM which impedes performance of ADLs/ IADLS. Pt can benefit from skilled occupational therapy.   Pt will benefit from skilled therapeutic intervention in order to improve on the following deficits (Retired) Decreased coordination;Decreased range of motion;Impaired sensation;Increased edema;Decreased endurance;Decreased activity tolerance;Pain;Impaired UE functional use;Impaired perceived functional ability;Decreased strength   Rehab Potential Fair   Clinical Impairments Affecting Rehab Potential pain   OT Frequency Other (comment)  4 visits   OT Duration 8 weeks   OT Treatment/Interventions Self-care/ADL training;Therapeutic exercise;Patient/family education;Splinting;Neuromuscular education;Therapeutic exercises;Therapeutic activities;DME and/or AE instruction;Electrical Stimulation;Scar mobilization;Passive range of motion;Contrast Bath;Moist Heat   Plan splint check and modifications PRN, edema control, digit ROM   Consulted and Agree with Plan of Care Patient        Problem List Patient Active Problem List   Diagnosis Date Noted  . Osteoarthritis of right hip 06/01/2013  . Severe obesity (BMI >= 40) 06/01/2013  . Narcotic abuse   . Barrett esophagus 03/20/2011  . Esophageal reflux 03/20/2011  . Gastroparesis 03/20/2011  . Abdominal pain, epigastric 03/19/2011  . Nausea and vomiting 03/18/2011  . Diarrhea 03/18/2011  . ARF (acute renal failure) 03/18/2011  . ONYCHOMYCOSIS, BILATERAL 08/22/2009  . PSORIASIS 08/22/2009  . CARPAL TUNNEL SYNDROME, BILATERAL 05/28/2009  . DIABETES MELLITUS, TYPE II, CONTROLLED 04/30/2009  . HYPERGLYCEMIA 04/25/2009  . BRONCHITIS, CHRONIC 04/08/2009  . PERIPHERAL  EDEMA 04/08/2009  . TOBACCO ABUSE 02/18/2009  . MENOPAUSAL SYNDROME 02/18/2009  . PRURITUS, EARS 02/18/2009  . OBESITY 07/26/2008  . NECK PAIN 04/26/2008  . ACUTE BRONCHITIS 10/23/2007  . DEGENERATIVE JOINT DISEASE, LEFT KNEE 07/20/2007  . INTERTRIGO 04/26/2007  . ANXIETY 01/29/2007  . DEPRESSION 01/29/2007  . CANDIDIASIS, SKIN 01/27/2007  . [place] DISEASE, LUMBOSACRAL SPINE 12/05/2006    Marielis Samara 09/05/2014, 10:47 AM Theone Murdoch, OTR/L [phone] Phone: [phone] 10:47 AM 09/05/2014 [facility] [address] [place], [place], [address] Phone: [phone]   Fax:  [phone]

## 2014-09-18 ENCOUNTER — Ambulatory Visit: Payer: Medicaid Other | Admitting: Occupational Therapy

## 2014-09-19 ENCOUNTER — Encounter: Payer: Medicaid Other | Admitting: Occupational Therapy

## 2014-09-25 ENCOUNTER — Ambulatory Visit: Payer: Medicaid Other | Attending: Orthopedic Surgery | Admitting: Occupational Therapy

## 2015-01-21 ENCOUNTER — Encounter: Payer: Self-pay | Admitting: Occupational Therapy

## 2015-01-21 NOTE — Therapy (Signed)
[facility] [address] [place], [place], [address] Phone: [phone]   Fax:  [phone]  Patient Details  Name: Michelle [name] MRN: [id number] Date of Birth: November 09, 1965 Referring Provider:  No ref. provider found  Encounter Date: 01/21/2015  OCCUPATIONAL THERAPY DISCHARGE SUMMARY  Visits from Start of Care: 1  Current functional level related to goals / functional outcomes: Unknown, pt did not return following initial eval and therefore goals were not achieved.    Remaining deficits: Unknown    Education / Equipment: Pt was educated regarding splint wear, care and precautions . Pt verbalized understanding yet di not return for follow up. Plan: Patient agrees to discharge.  Patient goals were not met. Patient is being discharged due to financial reasons.  ?????      RINE,KATHRYN 01/21/2015, 11:36 AM  [facility] [address] [place], [place], [address] Phone: [phone]   Fax:  [phone]

## 2015-02-25 DIAGNOSIS — Y998 Other external cause status: Secondary | ICD-10-CM | POA: Insufficient documentation

## 2015-02-25 DIAGNOSIS — F17210 Nicotine dependence, cigarettes, uncomplicated: Secondary | ICD-10-CM | POA: Insufficient documentation

## 2015-02-25 DIAGNOSIS — W1849XA Other slipping, tripping and stumbling without falling, initial encounter: Secondary | ICD-10-CM | POA: Insufficient documentation

## 2015-02-25 DIAGNOSIS — Y9389 Activity, other specified: Secondary | ICD-10-CM | POA: Insufficient documentation

## 2015-02-25 DIAGNOSIS — Y92512 Supermarket, store or market as the place of occurrence of the external cause: Secondary | ICD-10-CM | POA: Insufficient documentation

## 2015-02-25 DIAGNOSIS — E119 Type 2 diabetes mellitus without complications: Secondary | ICD-10-CM | POA: Diagnosis not present

## 2015-02-25 DIAGNOSIS — S8991XA Unspecified injury of right lower leg, initial encounter: Secondary | ICD-10-CM | POA: Insufficient documentation

## 2015-02-25 DIAGNOSIS — J45909 Unspecified asthma, uncomplicated: Secondary | ICD-10-CM | POA: Insufficient documentation

## 2015-02-25 DIAGNOSIS — E669 Obesity, unspecified: Secondary | ICD-10-CM | POA: Diagnosis not present

## 2015-02-26 ENCOUNTER — Encounter (HOSPITAL_COMMUNITY): Payer: Self-pay | Admitting: Emergency Medicine

## 2015-02-26 ENCOUNTER — Emergency Department (HOSPITAL_COMMUNITY)
Admission: EM | Admit: 2015-02-26 | Discharge: 2015-02-26 | Disposition: A | Discharge status: Home / Self Care | Payer: Medicaid Other | Attending: Emergency Medicine | Admitting: Emergency Medicine

## 2015-02-26 HISTORY — DX: Obesity, unspecified: E66.9

## 2015-02-26 NOTE — ED Notes (Signed)
Pt stated that she could not "wait 5 hours to be seen." She stated she would not have a ride if she stayed. Pt voiced understanding of risks of leaving without being seen by MD.

## 2015-02-26 NOTE — ED Notes (Signed)
Pt. reports right leg pain onset this morning and right knee pain last week , pt. stated she stumbled at the flea market last Saturday .

## 2015-02-27 ENCOUNTER — Encounter (HOSPITAL_COMMUNITY): Payer: Self-pay | Admitting: Cardiology

## 2015-02-27 ENCOUNTER — Emergency Department (HOSPITAL_COMMUNITY)
Admission: EM | Admit: 2015-02-27 | Discharge: 2015-02-27 | Disposition: A | Discharge status: Home / Self Care | Payer: Medicaid Other | Attending: Emergency Medicine | Admitting: Emergency Medicine

## 2015-02-27 ENCOUNTER — Emergency Department (HOSPITAL_COMMUNITY): Payer: Medicaid Other

## 2015-02-27 DIAGNOSIS — K2270 Barrett's esophagus without dysplasia: Secondary | ICD-10-CM | POA: Diagnosis not present

## 2015-02-27 DIAGNOSIS — Z880 Allergy status to penicillin: Secondary | ICD-10-CM | POA: Insufficient documentation

## 2015-02-27 DIAGNOSIS — M19042 Primary osteoarthritis, left hand: Secondary | ICD-10-CM | POA: Diagnosis not present

## 2015-02-27 DIAGNOSIS — K219 Gastro-esophageal reflux disease without esophagitis: Secondary | ICD-10-CM | POA: Diagnosis not present

## 2015-02-27 DIAGNOSIS — F17210 Nicotine dependence, cigarettes, uncomplicated: Secondary | ICD-10-CM | POA: Diagnosis not present

## 2015-02-27 DIAGNOSIS — M19041 Primary osteoarthritis, right hand: Secondary | ICD-10-CM | POA: Diagnosis not present

## 2015-02-27 DIAGNOSIS — M47819 Spondylosis without myelopathy or radiculopathy, site unspecified: Secondary | ICD-10-CM | POA: Diagnosis not present

## 2015-02-27 DIAGNOSIS — E119 Type 2 diabetes mellitus without complications: Secondary | ICD-10-CM | POA: Insufficient documentation

## 2015-02-27 DIAGNOSIS — F319 Bipolar disorder, unspecified: Secondary | ICD-10-CM | POA: Insufficient documentation

## 2015-02-27 DIAGNOSIS — Z79899 Other long term (current) drug therapy: Secondary | ICD-10-CM | POA: Diagnosis not present

## 2015-02-27 DIAGNOSIS — M5431 Sciatica, right side: Secondary | ICD-10-CM | POA: Diagnosis not present

## 2015-02-27 DIAGNOSIS — R52 Pain, unspecified: Secondary | ICD-10-CM

## 2015-02-27 DIAGNOSIS — M545 Low back pain: Secondary | ICD-10-CM | POA: Diagnosis not present

## 2015-02-27 DIAGNOSIS — Z8614 Personal history of Methicillin resistant Staphylococcus aureus infection: Secondary | ICD-10-CM | POA: Diagnosis not present

## 2015-02-27 DIAGNOSIS — E669 Obesity, unspecified: Secondary | ICD-10-CM | POA: Diagnosis not present

## 2015-02-27 DIAGNOSIS — M160 Bilateral primary osteoarthritis of hip: Secondary | ICD-10-CM | POA: Diagnosis not present

## 2015-02-27 DIAGNOSIS — M25551 Pain in right hip: Secondary | ICD-10-CM | POA: Insufficient documentation

## 2015-02-27 DIAGNOSIS — J45909 Unspecified asthma, uncomplicated: Secondary | ICD-10-CM | POA: Insufficient documentation

## 2015-02-27 DIAGNOSIS — Z872 Personal history of diseases of the skin and subcutaneous tissue: Secondary | ICD-10-CM | POA: Diagnosis not present

## 2015-02-27 DIAGNOSIS — F410 Panic disorder [episodic paroxysmal anxiety] without agoraphobia: Secondary | ICD-10-CM | POA: Insufficient documentation

## 2015-02-27 DIAGNOSIS — G43909 Migraine, unspecified, not intractable, without status migrainosus: Secondary | ICD-10-CM | POA: Diagnosis not present

## 2015-02-27 DIAGNOSIS — M170 Bilateral primary osteoarthritis of knee: Secondary | ICD-10-CM | POA: Diagnosis not present

## 2015-02-27 MED ORDER — TRAMADOL HCL 50 MG PO TABS: 50.0000 mg | ORAL_TABLET | Freq: Four times a day (QID) | ORAL | Status: DC | PRN

## 2015-02-27 MED ORDER — HYDROCODONE-ACETAMINOPHEN 5-325 MG PO TABS
2.0000 | ORAL_TABLET | Freq: Once | ORAL | Status: AC
  Administered 2015-02-27: 2 via ORAL
  Filled 2015-02-27: qty 2

## 2015-02-27 NOTE — ED Provider Notes (Signed)
CSN: [id number]     Arrival date & time 02/27/15  [id number] History  By signing my name below, I, Evelene Croon, attest that this documentation has been prepared under the direction and in the presence of non-physician practitioner, Hyman Bible, PA-C. Electronically Signed: Evelene Croon, Scribe. 02/27/2015. 11:33 AM.    Chief Complaint  Patient presents with  . Hip Pain  . Leg Pain     The history is provided by the patient. No language interpreter was used.    HPI Comments:  Michelle [name] is a 50 y.o. female with a history of right hip replacement in 2015, who presents to the Emergency Department complaining of moderate constant right hip pain x ~ 1 week. She denies recent injury/fall. Pt reports associated lower right sided back pain. She denies fever, chills, bowel/bladder incontinence, and numbness/tingling in her BLE. No alleviating factors noted.  She has taken Norco for pain without relief.     Past Medical History  Diagnosis Date  . GERD (gastroesophageal reflux disease)   . Depression   . Barrett's esophagus   . IBS (irritable bowel syndrome)   . DM (diabetes mellitus) ([place])     diet controlled  . Anxiety     panic attacts  . Bipolar disorder ([place])   . Migraine equivalent   . Arthritis     hips, knees, hands and spine  . Psoriasis     elbows, trunk  . History of gastric ulcer   . History of acute renal failure 2012    resolved  . History of MRSA infection 2014    back  . 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 Asthma     daily inhaler  . Obesity    Past Surgical History  Procedure Laterality Date  . Cervical fusion  1994; 2000  . Cholecystectomy  1991  . Carpal tunnel release Right 08/15/2009  . Carpal tunnel release  11/23/2010    Procedure: CARPAL TUNNEL RELEASE;  Surgeon: Meredith Pel;  Location: [facility];  Service: Orthopedics;  Laterality: Left;  revision left carpal tunnel release neurolysis of median nerve  . Esophagogastroduodenoscopy   03/20/2011    Procedure: ESOPHAGOGASTRODUODENOSCOPY (EGD);  Surgeon: Irene Shipper, MD;  Location[facility] ENDOSCOPY;  Service: Endoscopy;  Laterality: N/A;  Clarise Cruz /ja  . Knee arthroscopy Bilateral   . Carpectomy  10/05/2011    Procedure: CARPECTOMY;  Surgeon: Tennis Must, MD;  Location: [facility];  Service: Orthopedics;  Laterality: Left;  left proximal row carpectomy bone grafting capitate and radial styloidectomy  . Tubal ligation  1991  . Total hip arthroplasty Right 06/01/2013    Procedure: POSTERIOR RIGHT TOTAL HIP ARTHROPLASTY;  Surgeon: Alta Corning, MD;  Location: [facility];  Service: Orthopedics;  Laterality: Right;  . Multiple tooth extractions    . Carpal tunnel release Left 12/02/2009  . Esophagogastroduodenoscopy (egd) with esophageal dilation  05/10/2012    with Propofol  . Colonoscopy w/ polypectomy  06/14/2011    with Propofol  . Anterior lumbar fusion  12/05/2006    L5-S1  . Total knee arthroplasty Left 11/28/2007  . Wrist fusion Left 08/29/2014    Procedure: LEFT WRIST ARTHRODESIS WRIST;  Surgeon: Leanora Cover, MD;  Location: [facility];  Service: Orthopedics;  Laterality: Left;   Family History  Problem Relation Age of Onset  . Esophageal cancer Father   . Clotting disorder Daughter    Social History  Substance Use Topics  .  Smoking status: Current Every Day Smoker -- 0.00 packs/day for 33 years    Types: Cigarettes  . Smokeless tobacco: Never Used  . Alcohol Use: Yes     Comment: occasionally   OB History    No data available     Review of Systems  Constitutional: Negative for fever and chills.  Musculoskeletal: Positive for myalgias, back pain and arthralgias.       Right hip and RLE  Neurological: Negative for weakness and numbness.  All other systems reviewed and are negative.   Allergies  Penicillins and Codeine  Home Medications   Prior to Admission medications   Medication Sig Start Date End Date Taking? Authorizing Provider   ALPRAZolam Duanne Moron) 1 MG tablet Take 1 tablet by mouth daily as needed. 02/14/15  Yes Historical Provider, MD  buPROPion (WELLBUTRIN XL) 150 MG 24 hr tablet Take 150 mg by mouth daily.   Yes Historical Provider, MD  DULoxetine (CYMBALTA) 60 MG capsule Take 60 mg by mouth 2 (two) times daily.   Yes Historical Provider, MD  furosemide (LASIX) 40 MG tablet Take 40 mg by mouth.   Yes Historical Provider, MD  gabapentin (NEURONTIN) 600 MG tablet Take 600 mg by mouth 2 (two) times daily.   Yes Historical Provider, MD  Ipratropium-Albuterol (COMBIVENT RESPIMAT) 20-100 MCG/ACT AERS respimat Inhale 2 puffs into the lungs 3 (three) times daily as needed for wheezing.    Yes Historical Provider, MD  lamoTRIgine (LAMICTAL) 25 MG tablet Take 25 mg by mouth 2 (two) times daily.   Yes Historical Provider, MD  omeprazole (PRILOSEC) 20 MG capsule Take 20 mg by mouth 2 (two) times daily before a meal.   Yes Historical Provider, MD  traZODone (DESYREL) 100 MG tablet Take 100 mg by mouth at bedtime.   Yes Historical Provider, MD  oxyCODONE-acetaminophen (PERCOCET) 5-325 MG per tablet 1-2 tabs po q6 hours prn pain Patient not taking: Reported on 02/27/2015 08/29/14   Leanora Cover, MD   BP 110/80 mmHg  Pulse 82  Temp(Src) 97.6 F (36.4 C) (Oral)  Resp 20  SpO2 96%  LMP 02/27/2011 Physical Exam  Constitutional: She is oriented to person, place, and time. She appears well-developed and well-nourished. No distress.  HENT:  Head: Normocephalic and atraumatic.  Eyes: Conjunctivae are normal.  Neck: Normal range of motion. Neck supple.  Cardiovascular: Normal rate and regular rhythm.   Pulses:      Dorsalis pedis pulses are 2+ on the right side.  Pulmonary/Chest: Effort normal and breath sounds normal.  Abdominal: She exhibits no distension.  Musculoskeletal:  Mild TTP lower lumbar spine No C or T spine tenderness Pain with flexion of right hip  Mild TTP of right hip no erythema, edema, or warmth  Pain over right  SI joint  No erythema, edema or warmth of the RLE  Neurological: She is alert and oriented to person, place, and time. She has normal strength.  Reflex Scores:      Patellar reflexes are 2+ on the right side and 2+ on the left side. Distal sensation of right foot intact  Skin: Skin is warm and dry. No erythema.  Old healed scar to right hip   Psychiatric: She has a normal mood and affect.  Nursing note and vitals reviewed.   ED Course  Procedures DIAGNOSTIC STUDIES:  Oxygen Saturation is 96% on RA, normal by my interpretation.    COORDINATION OF CARE:  11:30 AM Will order XR of the right hip. Discussed treatment  plan with pt at bedside and pt agreed to plan.  Imaging Review Dg Hip Unilat With Pelvis 2-3 Views Right  02/27/2015  CLINICAL DATA:  Sudden onset right hip pain, no known injury, initial encounter EXAM: DG HIP (WITH OR WITHOUT PELVIS) 2-3V RIGHT COMPARISON:  04/19/2014 FINDINGS: Right hip prosthesis is again noted and stable. Postsurgical changes in the lower lumbar spine are seen. No acute fracture or dislocation is noted. No soft tissue abnormality is noted. IMPRESSION: No acute abnormality seen. Electronically Signed   By: Inez Catalina M.D.   On: 02/27/2015 12:06   I have personally reviewed and evaluated these images as part of my medical decision-making.    MDM   Final diagnoses:  Pain    Patient X-Ray negative for acute abnormailites.  No signs of infection on exam.  Pt advised to follow up with orthopedics. Conservative therapy recommended and discussed. Patient requesting Percocet at discharge.  Review of the narcotic database shows that she had 60 Norco filled 13 days ago.  Patient became very agitated and angry when told that she would not be getting an additional Rx for Percocet.  She stated, "I need Percocet for my chronic pain.  Norco does nothing."  Patient told she would have to follow up with her PCP for treatment of chronic pain.  Patient stable for  discharge.  Returns precautions discussed.   I personally performed the services described in this documentation, which was scribed in my presence. The recorded information has been reviewed and is accurate.    Hyman Bible, PA-C 02/27/15 1624  Carmin Muskrat, MD 02/28/15 [phone]

## 2015-02-27 NOTE — ED Notes (Signed)
Declined W/C at D/C and was escorted to lobby by RN. 

## 2015-02-27 NOTE — ED Notes (Signed)
Pt reports right sided hip pain and leg pain for the past couple of days. States she had a hip replacement on that same side in 2015.

## 2015-04-21 ENCOUNTER — Encounter: Payer: Self-pay | Admitting: Physical Medicine & Rehabilitation

## 2015-08-11 ENCOUNTER — Emergency Department (HOSPITAL_COMMUNITY)
Admission: EM | Admit: 2015-08-11 | Discharge: 2015-08-11 | Disposition: A | Discharge status: Home / Self Care | Payer: Medicaid Other | Attending: Dermatology | Admitting: Dermatology

## 2015-08-11 ENCOUNTER — Encounter (HOSPITAL_COMMUNITY): Payer: Self-pay | Admitting: Emergency Medicine

## 2015-08-11 DIAGNOSIS — R51 Headache: Secondary | ICD-10-CM | POA: Insufficient documentation

## 2015-08-11 DIAGNOSIS — M549 Dorsalgia, unspecified: Secondary | ICD-10-CM | POA: Insufficient documentation

## 2015-08-11 DIAGNOSIS — R300 Dysuria: Secondary | ICD-10-CM | POA: Diagnosis not present

## 2015-08-11 DIAGNOSIS — E119 Type 2 diabetes mellitus without complications: Secondary | ICD-10-CM | POA: Diagnosis not present

## 2015-08-11 DIAGNOSIS — Z5321 Procedure and treatment not carried out due to patient leaving prior to being seen by health care provider: Secondary | ICD-10-CM | POA: Insufficient documentation

## 2015-08-11 DIAGNOSIS — J45909 Unspecified asthma, uncomplicated: Secondary | ICD-10-CM | POA: Diagnosis not present

## 2015-08-11 DIAGNOSIS — F17210 Nicotine dependence, cigarettes, uncomplicated: Secondary | ICD-10-CM | POA: Insufficient documentation

## 2015-08-11 DIAGNOSIS — R350 Frequency of micturition: Secondary | ICD-10-CM | POA: Diagnosis not present

## 2015-08-11 LAB — URINALYSIS, ROUTINE W REFLEX MICROSCOPIC
Bilirubin Urine: NEGATIVE
Glucose, UA: NEGATIVE mg/dL
Ketones, ur: NEGATIVE mg/dL
Nitrite: NEGATIVE
Protein, ur: NEGATIVE mg/dL
Specific Gravity, Urine: 1.016 (ref 1.005–1.030)
pH: 6 (ref 5.0–8.0)

## 2015-08-11 LAB — URINE MICROSCOPIC-ADD ON

## 2015-08-11 NOTE — ED Triage Notes (Signed)
Pt c/o headache that started 2 days ago, but states has felt bad since July 18th, now has back pain, urinary frequency, dysuria. Pt crying at triage-- to ED via [facility].

## 2015-08-11 NOTE — ED Notes (Signed)
Patient states will be going home and will see her primary Doctor.

## 2016-02-02 ENCOUNTER — Other Ambulatory Visit: Payer: Self-pay | Admitting: Orthopedic Surgery

## 2016-02-11 ENCOUNTER — Encounter (HOSPITAL_COMMUNITY): Payer: Self-pay

## 2016-02-11 ENCOUNTER — Encounter (HOSPITAL_COMMUNITY)
Admission: RE | Admit: 2016-02-11 | Discharge: 2016-02-11 | Disposition: A | Discharge status: Home / Self Care | Payer: Medicaid Other | Source: Ambulatory Visit | Attending: Orthopedic Surgery | Admitting: Orthopedic Surgery

## 2016-02-11 DIAGNOSIS — Z01818 Encounter for other preprocedural examination: Secondary | ICD-10-CM | POA: Insufficient documentation

## 2016-02-11 DIAGNOSIS — F319 Bipolar disorder, unspecified: Secondary | ICD-10-CM | POA: Insufficient documentation

## 2016-02-11 DIAGNOSIS — Z0183 Encounter for blood typing: Secondary | ICD-10-CM | POA: Diagnosis not present

## 2016-02-11 DIAGNOSIS — R7303 Prediabetes: Secondary | ICD-10-CM | POA: Insufficient documentation

## 2016-02-11 DIAGNOSIS — Z01812 Encounter for preprocedural laboratory examination: Secondary | ICD-10-CM | POA: Insufficient documentation

## 2016-02-11 DIAGNOSIS — F17200 Nicotine dependence, unspecified, uncomplicated: Secondary | ICD-10-CM | POA: Insufficient documentation

## 2016-02-11 DIAGNOSIS — Z96641 Presence of right artificial hip joint: Secondary | ICD-10-CM | POA: Diagnosis not present

## 2016-02-11 DIAGNOSIS — M879 Osteonecrosis, unspecified: Secondary | ICD-10-CM | POA: Diagnosis not present

## 2016-02-11 DIAGNOSIS — Z79899 Other long term (current) drug therapy: Secondary | ICD-10-CM | POA: Insufficient documentation

## 2016-02-11 DIAGNOSIS — K219 Gastro-esophageal reflux disease without esophagitis: Secondary | ICD-10-CM | POA: Diagnosis not present

## 2016-02-11 DIAGNOSIS — J45909 Unspecified asthma, uncomplicated: Secondary | ICD-10-CM | POA: Diagnosis not present

## 2016-02-11 DIAGNOSIS — M1711 Unilateral primary osteoarthritis, right knee: Secondary | ICD-10-CM | POA: Diagnosis not present

## 2016-02-11 HISTORY — DX: Prediabetes: R73.03

## 2016-02-11 LAB — CBC WITH DIFFERENTIAL/PLATELET
Basophils Absolute: 0 10*3/uL (ref 0.0–0.1)
Basophils Relative: 0 %
Eosinophils Absolute: 0.1 10*3/uL (ref 0.0–0.7)
Eosinophils Relative: 2 %
HCT: 43.3 % (ref 36.0–46.0)
Hemoglobin: 13.7 g/dL (ref 12.0–15.0)
Lymphocytes Relative: 42 %
Lymphs Abs: 1.9 10*3/uL (ref 0.7–4.0)
MCH: 29.2 pg (ref 26.0–34.0)
MCHC: 31.6 g/dL (ref 30.0–36.0)
MCV: 92.3 fL (ref 78.0–100.0)
Monocytes Absolute: 0.2 10*3/uL (ref 0.1–1.0)
Monocytes Relative: 5 %
Neutro Abs: 2.3 10*3/uL (ref 1.7–7.7)
Neutrophils Relative %: 51 %
Platelets: 120 10*3/uL — ABNORMAL LOW (ref 150–400)
RBC: 4.69 MIL/uL (ref 3.87–5.11)
RDW: 14.9 % (ref 11.5–15.5)
WBC: 4.5 10*3/uL (ref 4.0–10.5)

## 2016-02-11 LAB — URINALYSIS, ROUTINE W REFLEX MICROSCOPIC
Bilirubin Urine: NEGATIVE
Glucose, UA: NEGATIVE mg/dL
Ketones, ur: 5 mg/dL — AB
Leukocytes, UA: NEGATIVE
Nitrite: NEGATIVE
Protein, ur: NEGATIVE mg/dL
Specific Gravity, Urine: 1.017 (ref 1.005–1.030)
pH: 5 (ref 5.0–8.0)

## 2016-02-11 LAB — COMPREHENSIVE METABOLIC PANEL
ALT: 14 U/L (ref 14–54)
AST: 23 U/L (ref 15–41)
Albumin: 3.2 g/dL — ABNORMAL LOW (ref 3.5–5.0)
Alkaline Phosphatase: 96 U/L (ref 38–126)
Anion gap: 10 (ref 5–15)
BUN: 6 mg/dL (ref 6–20)
CO2: 26 mmol/L (ref 22–32)
Calcium: 9 mg/dL (ref 8.9–10.3)
Chloride: 102 mmol/L (ref 101–111)
Creatinine, Ser: 1.07 mg/dL — ABNORMAL HIGH (ref 0.44–1.00)
GFR calc Af Amer: >60 mL/min (ref >60)
GFR calc non Af Amer: 59 mL/min — ABNORMAL LOW (ref >60)
Glucose, Bld: 115 mg/dL — ABNORMAL HIGH (ref 65–99)
Potassium: 3.4 mmol/L — ABNORMAL LOW (ref 3.5–5.1)
Sodium: 138 mmol/L (ref 135–145)
Total Bilirubin: 0.3 mg/dL (ref 0.3–1.2)
Total Protein: 6.9 g/dL (ref 6.5–8.1)

## 2016-02-11 LAB — SURGICAL PCR SCREEN
MRSA, PCR: NEGATIVE
Staphylococcus aureus: POSITIVE — AB

## 2016-02-11 LAB — PROTIME-INR
INR: 1.07
Prothrombin Time: 14 seconds (ref 11.4–15.2)

## 2016-02-11 LAB — TYPE AND SCREEN
ABO/RH(D): O POS
Antibody Screen: NEGATIVE

## 2016-02-11 LAB — APTT: aPTT: 31 seconds (ref 24–36)

## 2016-02-11 NOTE — Pre-Procedure Instructions (Addendum)
Michelle [name]  02/11/2016      [facility] [address] [facility], [place] - [address] [place] [address] Phone: [phone] Fax: [phone]  [facility], [place] [address] Phone: [phone] Fax: [phone]    Your procedure is scheduled on *02/16/16  Report to [facility] at 1215 A.M.  Call this number if you have problems the morning of surgery:  [phone]   Remember:  Do not eat food or drink liquids after midnight.  Take these medicines the morning of surgery with A SIP OF WATER     Inhaler needed,need,,duloxetine(cymbalta),,lamictal,tramadol if needed,omeprzole   STOP all herbel meds, nsaids (aleve,naproxen,advil,ibuprofen) starting NOW including all vitamins/supplements, aspirin   Do not wear jewelry, make-up or nail polish.  Do not wear lotions, powders, or perfumes, or deoderant.  Do not shave 48 hours prior to surgery.  Men may shave face and neck.  Do not bring valuables to the hospital.  [facility] is not responsible for any belongings or valuables.  Contacts, dentures or bridgework may not be worn into surgery.  Leave your suitcase in the car.  After surgery it may be brought to your room.  For patients admitted to the hospital, discharge time will be determined by your treatment team.  Patients discharged the day of surgery will not be allowed to drive home.   Special instructions:  Special Instructions: [facility] - Preparing for Surgery  Before surgery, you can play an important role.  Because skin is not sterile, your skin needs to be as free of germs as possible.  You can reduce the number of germs on you skin by washing with CHG (chlorahexidine gluconate) soap before surgery.  CHG is an antiseptic cleaner which kills germs and bonds with the skin to continue killing germs even after  washing.  Please DO NOT use if you have an allergy to CHG or antibacterial soaps.  If your skin becomes reddened/irritated stop using the CHG and inform your nurse when you arrive at Short Stay.  Do not shave (including legs and underarms) for at least 48 hours prior to the first CHG shower.  You may shave your face.  Please follow these instructions carefully:   1.  Shower with CHG Soap the night before surgery and the morning of Surgery.  2.  If you choose to wash your hair, wash your hair first as usual with your normal shampoo.  3.  After you shampoo, rinse your hair and body thoroughly to remove the Shampoo.  4.  Use CHG as you would any other liquid soap.  You can apply chg directly  to the skin and wash gently with scrungie or a clean washcloth.  5.  Apply the CHG Soap to your body ONLY FROM THE NECK DOWN.  Do not use on open wounds or open sores.  Avoid contact with your eyes ears, mouth and genitals (private parts).  Wash genitals (private parts)       with your normal soap.  6.  Wash thoroughly, paying special attention to the area where your surgery will be performed.  7.  Thoroughly rinse your body with warm water from the neck down.  8.  DO NOT shower/wash with your normal soap after using and rinsing off the CHG Soap.  9.  Pat yourself dry with a clean towel.  10.  Wear clean pajamas.            11.  Place clean sheets on your bed the night of your first shower and do not sleep with pets.  Day of Surgery  Do not apply any lotions/deodorants the morning of surgery.  Please wear clean clothes to the hospital/surgery center.  Please read over the fact sheets that you were given.

## 2016-02-11 NOTE — Progress Notes (Signed)
Mupirocin Ointment Rx called into [facility] on [address] for positive PCR of Staph. Pt notified and voiced understanding. 

## 2016-02-12 LAB — HEMOGLOBIN A1C
Hgb A1c MFr Bld: 5.7 % — ABNORMAL HIGH (ref 4.8–5.6)
Mean Plasma Glucose: 117 mg/dL

## 2016-02-13 MED ORDER — CLINDAMYCIN PHOSPHATE 900 MG/50ML IV SOLN
900.0000 mg | INTRAVENOUS | Status: AC
  Administered 2016-02-16: 900 mg via INTRAVENOUS
  Filled 2016-02-13: qty 50

## 2016-02-13 NOTE — Progress Notes (Signed)
Anesthesia Chart Review:  Pt is a 51 year old female scheduled for R total knee arthroplasty on 02/16/2016 with Dorna Leitz, MD.   - PCP is [place] Ram, MD  PMH includes:  Pre-diabetes, asthma, bipolar disorder, GERD. Current smoker. BMI 43. S/p R THA 06/01/13.   Medications include: Lasix, Combivent, Prilosec.  Preoperative labs reviewed.    CXR 02/01/16: Chronic bronchitic changes. No acute pneumonia, CHF, nor other cardiopulmonary abnormality.  EKG will be obtained DOS.   Echo 11/28/15 ([facility]): 1. Mild concentric LVH with normal LV internal dimension wall motion. EF 61%. Grade 1 LV diastolic dysfunction. 2. Normal RV size and contractility. 3. Trace mitral regurgitation and trace tricuspid regurgitation.  If EKG acceptable DOS, I anticipate pt can proceed as scheduled.   Willeen Cass, FNP-BC [facility] Short Stay Surgical [facility]/Anesthesiology Phone: [phone] 02/13/2016 12:45 PM

## 2016-02-15 NOTE — H&P (Signed)
TOTAL KNEE ADMISSION H&P  Patient is being admitted for right total knee arthroplasty.  Subjective:  Chief Complaint:right knee pain.  HPI: Michelle [name], 51 y.o. female, has a history of pain and functional disability in the right knee due to arthritis and has failed non-surgical conservative treatments for greater than 12 weeks to includeNSAID's and/or analgesics, corticosteriod injections, viscosupplementation injections, weight reduction as appropriate and activity modification.  Onset of symptoms was gradual, starting 4 years ago with gradually worsening course since that time. The patient noted no past surgery on the right knee(s).  Patient currently rates pain in the right knee(s) at 8 out of 10 with activity. Patient has night pain, worsening of pain with activity and weight bearing, pain that interferes with activities of daily living, pain with passive range of motion and joint swelling.  Patient has evidence of subchondral cysts, subchondral sclerosis, periarticular osteophytes and AVN by imaging studies. This patient has had avascular necrosis of the knee. There is no active infection. The patient is morbidly obese with BMI>40 and history of emmotional problems and I expect her to stay greater than 2 midnights.  Patient Active Problem List   Diagnosis Date Noted  . Osteoarthritis of right hip 06/01/2013  . Severe obesity (BMI >= 40) ([place]) 06/01/2013  . Narcotic abuse   . Barrett esophagus 03/20/2011  . Esophageal reflux 03/20/2011  . Gastroparesis 03/20/2011  . Abdominal pain, epigastric 03/19/2011  . Nausea and vomiting 03/18/2011  . Diarrhea 03/18/2011  . ARF (acute renal failure) ([place]) 03/18/2011  . ONYCHOMYCOSIS, BILATERAL 08/22/2009  . PSORIASIS 08/22/2009  . CARPAL TUNNEL SYNDROME, BILATERAL 05/28/2009  . DIABETES MELLITUS, TYPE II, CONTROLLED 04/30/2009  . HYPERGLYCEMIA 04/25/2009  . BRONCHITIS, CHRONIC 04/08/2009  . PERIPHERAL EDEMA 04/08/2009  . TOBACCO ABUSE  02/18/2009  . MENOPAUSAL SYNDROME 02/18/2009  . PRURITUS, EARS 02/18/2009  . OBESITY 07/26/2008  . NECK PAIN 04/26/2008  . ACUTE BRONCHITIS 10/23/2007  . DEGENERATIVE JOINT DISEASE, LEFT KNEE 07/20/2007  . INTERTRIGO 04/26/2007  . ANXIETY 01/29/2007  . DEPRESSION 01/29/2007  . CANDIDIASIS, SKIN 01/27/2007  . [place] DISEASE, LUMBOSACRAL SPINE 12/05/2006   Past Medical History:  Diagnosis Date  . Anxiety    panic attacts  . Arthritis    hips, knees, hands and spine  . Asthma    daily inhaler  . Barrett's esophagus   . Bipolar disorder ([place])   . Depression   . GERD (gastroesophageal reflux disease)   . History of acute renal failure 2012   resolved  . History of gastric ulcer   . History of MRSA infection 2014   back  . IBS (irritable bowel syndrome)   . 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 Migraine equivalent    hx  . Obesity   . Pre-diabetes   . Psoriasis    elbows, trunk    Past Surgical History:  Procedure Laterality Date  . ANTERIOR LUMBAR FUSION  12/05/2006   L5-S1  . CARPAL TUNNEL RELEASE Right 08/15/2009  . CARPAL TUNNEL RELEASE  11/23/2010   Procedure: CARPAL TUNNEL RELEASE;  Surgeon: Meredith Pel;  Location: [place];  Service: Orthopedics;  Laterality: Left;  revision left carpal tunnel release neurolysis of median nerve  . CARPAL TUNNEL RELEASE Left 12/02/2009  . CARPECTOMY  10/05/2011   Procedure: CARPECTOMY;  Surgeon: Tennis Must, MD;  Location: [place];  Service: Orthopedics;  Laterality: Left;  left proximal row carpectomy bone grafting capitate and radial styloidectomy  .  CERVICAL FUSION  1994; 2000  . CHOLECYSTECTOMY  1991  . COLONOSCOPY W/ POLYPECTOMY  06/14/2011   with Propofol  . ESOPHAGOGASTRODUODENOSCOPY  03/20/2011   Procedure: ESOPHAGOGASTRODUODENOSCOPY (EGD);  Surgeon: Irene Shipper, MD;  Location: [facility] ENDOSCOPY;  Service: Endoscopy;  Laterality: N/A;  Clarise Cruz /ja  . ESOPHAGOGASTRODUODENOSCOPY (EGD) WITH ESOPHAGEAL DILATION   05/10/2012   with Propofol  . KNEE ARTHROSCOPY Bilateral   . MULTIPLE TOOTH EXTRACTIONS    . TOTAL HIP ARTHROPLASTY Right 06/01/2013   Procedure: POSTERIOR RIGHT TOTAL HIP ARTHROPLASTY;  Surgeon: Alta Corning, MD;  Location: [place];  Service: Orthopedics;  Laterality: Right;  . TOTAL KNEE ARTHROPLASTY Left 11/28/2007  . TUBAL LIGATION  1991  . WRIST FUSION Left 08/29/2014   Procedure: LEFT WRIST ARTHRODESIS WRIST;  Surgeon: Leanora Cover, MD;  Location: [place];  Service: Orthopedics;  Laterality: Left;    No prescriptions prior to admission.   Allergies  Allergen Reactions  . Penicillins Itching, Nausea And Vomiting and Rash    Has patient had a PCN reaction causing immediate rash, facial/tongue/throat swelling, SOB or lightheadedness with hypotension: #  #  #  YES  #  #  #  Has patient had a PCN reaction causing severe rash involving mucus membranes or skin necrosis: No Has patient had a PCN reaction that required hospitalization No Has patient had a PCN reaction occurring within the last 10 years: No If all of the above answers are "NO", then may proceed with Cephalosporin use.   . Tylenol [Acetaminophen] Other (See Comments)    Dr advised not to take due to kidney failure in 12  . Codeine Itching    Social History  Substance Use Topics  . Smoking status: Current Every Day Smoker    Packs/day: 0.50    Years: 33.00    Types: Cigarettes  . Smokeless tobacco: Never Used  . Alcohol use Yes     Comment: occasionally    Family History  Problem Relation Age of Onset  . Esophageal cancer Father   . Clotting disorder Daughter      ROS ROS: I have reviewed the patient's review of systems thoroughly and there are no positive responses as relates to the HPI. Objective:  Physical Exam  Vital signs in last 24 hours:   Well-developed well-nourished patient in no acute distress. Alert and oriented x3 HEENT:within normal limits Cardiac: Regular rate and  rhythm Pulmonary: Lungs clear to auscultation Abdomen: Soft and nontender.  Normal active bowel sounds  Musculoskeletal: (r knee: painful ro /limited rom skin in good repair/ trace effusion) Labs: Recent Results (from the past 2160 hour(s))  Surgical pcr screen     Status: Abnormal   Collection Time: 02/11/16  1:38 PM  Result Value Ref Range   MRSA, PCR NEGATIVE NEGATIVE   Staphylococcus aureus POSITIVE (A) NEGATIVE    Comment:        The Xpert SA Assay (FDA approved for NASAL specimens in patients over 80 years of age), is one component of a comprehensive surveillance program.  Test performance has been validated by [facility] for patients greater than or equal to 65 year old. It is not intended to diagnose infection nor to guide or monitor treatment.   Urinalysis, Routine w reflex microscopic     Status: Abnormal   Collection Time: 02/11/16  1:38 PM  Result Value Ref Range   Color, Urine YELLOW YELLOW   APPearance CLEAR CLEAR   Specific Gravity, Urine 1.017 1.005 -  1.030   pH 5.0 5.0 - 8.0   Glucose, UA NEGATIVE NEGATIVE mg/dL   Hgb urine dipstick SMALL (A) NEGATIVE   Bilirubin Urine NEGATIVE NEGATIVE   Ketones, ur 5 (A) NEGATIVE mg/dL   Protein, ur NEGATIVE NEGATIVE mg/dL   Nitrite NEGATIVE NEGATIVE   Leukocytes, UA NEGATIVE NEGATIVE   RBC / HPF 0-5 0 - 5 RBC/hpf   WBC, UA 6-30 0 - 5 WBC/hpf   Bacteria, UA RARE (A) NONE SEEN   Squamous Epithelial / LPF 0-5 (A) NONE SEEN   Mucous PRESENT   Hemoglobin A1c     Status: Abnormal   Collection Time: 02/11/16  1:44 PM  Result Value Ref Range   Hgb A1c MFr Bld 5.7 (H) 4.8 - 5.6 %    Comment: (NOTE)         Pre-diabetes: 5.7 - 6.4         Diabetes: >6.4         Glycemic control for adults with diabetes: <7.0    Mean Plasma Glucose 117 mg/dL    Comment: (NOTE) Performed At: [facility] [place], [place] [id number] Lindon Romp MD OQ:[id number]   APTT     Status: None   Collection Time:  02/11/16  1:44 PM  Result Value Ref Range   aPTT 31 24 - 36 seconds  CBC WITH DIFFERENTIAL     Status: Abnormal   Collection Time: 02/11/16  1:44 PM  Result Value Ref Range   WBC 4.5 4.0 - 10.5 K/uL   RBC 4.69 3.87 - 5.11 MIL/uL   Hemoglobin 13.7 12.0 - 15.0 g/dL   HCT 43.3 36.0 - 46.0 %   MCV 92.3 78.0 - 100.0 fL   MCH 29.2 26.0 - 34.0 pg   MCHC 31.6 30.0 - 36.0 g/dL   RDW 14.9 11.5 - 15.5 %   Platelets 120 (L) 150 - 400 K/uL   Neutrophils Relative % 51 %   Neutro Abs 2.3 1.7 - 7.7 K/uL   Lymphocytes Relative 42 %   Lymphs Abs 1.9 0.7 - 4.0 K/uL   Monocytes Relative 5 %   Monocytes Absolute 0.2 0.1 - 1.0 K/uL   Eosinophils Relative 2 %   Eosinophils Absolute 0.1 0.0 - 0.7 K/uL   Basophils Relative 0 %   Basophils Absolute 0.0 0.0 - 0.1 K/uL  Comprehensive metabolic panel     Status: Abnormal   Collection Time: 02/11/16  1:44 PM  Result Value Ref Range   Sodium 138 135 - 145 mmol/L   Potassium 3.4 (L) 3.5 - 5.1 mmol/L   Chloride 102 101 - 111 mmol/L   CO2 26 22 - 32 mmol/L   Glucose, Bld 115 (H) 65 - 99 mg/dL   BUN 6 6 - 20 mg/dL   Creatinine, Ser 1.07 (H) 0.44 - 1.00 mg/dL   Calcium 9.0 8.9 - 10.3 mg/dL   Total Protein 6.9 6.5 - 8.1 g/dL   Albumin 3.2 (L) 3.5 - 5.0 g/dL   AST 23 15 - 41 U/L   ALT 14 14 - 54 U/L   Alkaline Phosphatase 96 38 - 126 U/L   Total Bilirubin 0.3 0.3 - 1.2 mg/dL   GFR calc non Af Amer 59 (L) >60 mL/min   GFR calc Af Amer >60 >60 mL/min    Comment: (NOTE) The eGFR has been calculated using the CKD EPI equation. This calculation has not been validated in all clinical situations. eGFR's persistently <60 mL/min signify possible  Chronic Kidney Disease.    Anion gap 10 5 - 15  Protime-INR     Status: None   Collection Time: 02/11/16  1:44 PM  Result Value Ref Range   Prothrombin Time 14.0 11.4 - 15.2 seconds   INR 1.07   Type and screen Order type and screen if day of surgery is less than 15 days from draw of preadmission visit or order  morning of surgery if day of surgery is greater than 6 days from preadmission visit.     Status: None   Collection Time: 02/11/16  1:55 PM  Result Value Ref Range   ABO/RH(D) O POS    Antibody Screen NEG    Sample Expiration 02/25/2016    Extend sample reason NO TRANSFUSIONS OR PREGNANCY IN THE PAST 3 MONTHS     Estimated body mass index is 42.73 kg/m as calculated from the following:   Height as of 02/11/16: _0  (1.753 m).   Weight as of 02/11/16: 131.3 kg (289 lb 6 oz).   Imaging Review Plain radiographs demonstrate severe degenerative joint disease of the right knee(s). The overall alignment ismild varus. The bone quality appears to be fair for age and reported activity level.  Assessment/Plan:  End stage arthritis, right knee   The patient history, physical examination, clinical judgment of the provider and imaging studies are consistent with end stage degenerative joint disease of the right knee(s) and total knee arthroplasty is deemed medically necessary. The treatment options including medical management, injection therapy arthroscopy and arthroplasty were discussed at length. The risks and benefits of total knee arthroplasty were presented and reviewed. The risks due to aseptic loosening, infection, stiffness, patella tracking problems, thromboembolic complications and other imponderables were discussed. The patient acknowledged the explanation, agreed to proceed with the plan and consent was signed. Patient is being admitted for inpatient treatment for surgery, pain control, PT, OT, prophylactic antibiotics, VTE prophylaxis, progressive ambulation and ADL's and discharge planning. The patient is planning to be discharged home with home health services

## 2016-02-16 ENCOUNTER — Encounter (HOSPITAL_COMMUNITY): Payer: Self-pay | Admitting: *Deleted

## 2016-02-16 ENCOUNTER — Encounter (HOSPITAL_COMMUNITY)
Admission: RE | Disposition: A | Discharge status: Home Health | Payer: Self-pay | Source: Ambulatory Visit | Attending: Orthopedic Surgery

## 2016-02-16 ENCOUNTER — Inpatient Hospital Stay (HOSPITAL_COMMUNITY): Payer: Medicaid Other | Admitting: Emergency Medicine

## 2016-02-16 ENCOUNTER — Inpatient Hospital Stay (HOSPITAL_COMMUNITY): Payer: Medicaid Other | Admitting: Anesthesiology

## 2016-02-16 ENCOUNTER — Inpatient Hospital Stay (HOSPITAL_COMMUNITY)
Admission: RE | Admit: 2016-02-16 | Discharge: 2016-02-18 | DRG: 470 | Disposition: A | Discharge status: Home Health | Payer: Medicaid Other | Source: Ambulatory Visit | Attending: Orthopedic Surgery | Admitting: Orthopedic Surgery

## 2016-02-16 DIAGNOSIS — K3184 Gastroparesis: Secondary | ICD-10-CM | POA: Diagnosis present

## 2016-02-16 DIAGNOSIS — K219 Gastro-esophageal reflux disease without esophagitis: Secondary | ICD-10-CM | POA: Diagnosis present

## 2016-02-16 DIAGNOSIS — F319 Bipolar disorder, unspecified: Secondary | ICD-10-CM | POA: Diagnosis present

## 2016-02-16 DIAGNOSIS — Z9049 Acquired absence of other specified parts of digestive tract: Secondary | ICD-10-CM | POA: Diagnosis not present

## 2016-02-16 DIAGNOSIS — F17210 Nicotine dependence, cigarettes, uncomplicated: Secondary | ICD-10-CM | POA: Diagnosis present

## 2016-02-16 DIAGNOSIS — M87051 Idiopathic aseptic necrosis of right femur: Secondary | ICD-10-CM | POA: Diagnosis present

## 2016-02-16 DIAGNOSIS — Z96641 Presence of right artificial hip joint: Secondary | ICD-10-CM | POA: Diagnosis present

## 2016-02-16 DIAGNOSIS — Z8614 Personal history of Methicillin resistant Staphylococcus aureus infection: Secondary | ICD-10-CM

## 2016-02-16 DIAGNOSIS — E1143 Type 2 diabetes mellitus with diabetic autonomic (poly)neuropathy: Secondary | ICD-10-CM | POA: Diagnosis present

## 2016-02-16 DIAGNOSIS — Z6841 Body Mass Index (BMI) 40.0 and over, adult: Secondary | ICD-10-CM

## 2016-02-16 DIAGNOSIS — Z96652 Presence of left artificial knee joint: Secondary | ICD-10-CM | POA: Diagnosis present

## 2016-02-16 DIAGNOSIS — Z8711 Personal history of peptic ulcer disease: Secondary | ICD-10-CM | POA: Diagnosis not present

## 2016-02-16 DIAGNOSIS — F419 Anxiety disorder, unspecified: Secondary | ICD-10-CM | POA: Diagnosis present

## 2016-02-16 DIAGNOSIS — M1711 Unilateral primary osteoarthritis, right knee: Secondary | ICD-10-CM | POA: Diagnosis present

## 2016-02-16 DIAGNOSIS — Z981 Arthrodesis status: Secondary | ICD-10-CM

## 2016-02-16 DIAGNOSIS — J45909 Unspecified asthma, uncomplicated: Secondary | ICD-10-CM | POA: Diagnosis present

## 2016-02-16 DIAGNOSIS — Z79899 Other long term (current) drug therapy: Secondary | ICD-10-CM

## 2016-02-16 DIAGNOSIS — M25561 Pain in right knee: Secondary | ICD-10-CM | POA: Diagnosis present

## 2016-02-16 HISTORY — PX: TOTAL KNEE ARTHROPLASTY: SHX125

## 2016-02-16 LAB — GLUCOSE, CAPILLARY: Glucose-Capillary: 119 mg/dL — ABNORMAL HIGH (ref 65–99)

## 2016-02-16 SURGERY — ARTHROPLASTY, KNEE, TOTAL
Anesthesia: General | Laterality: Right

## 2016-02-16 MED ORDER — ROCURONIUM BROMIDE 100 MG/10ML IV SOLN
INTRAVENOUS | Status: DC | PRN
  Administered 2016-02-16: 50 mg via INTRAVENOUS

## 2016-02-16 MED ORDER — BUPIVACAINE HCL (PF) 0.5 % IJ SOLN
INTRAMUSCULAR | Status: DC | PRN
  Administered 2016-02-16: 30 mL

## 2016-02-16 MED ORDER — GABAPENTIN 300 MG PO CAPS
ORAL_CAPSULE | ORAL | Status: AC
  Filled 2016-02-16: qty 2

## 2016-02-16 MED ORDER — FENTANYL CITRATE (PF) 100 MCG/2ML IJ SOLN
INTRAMUSCULAR | Status: DC | PRN
  Administered 2016-02-16: 50 ug via INTRAVENOUS

## 2016-02-16 MED ORDER — ACETAMINOPHEN 500 MG PO TABS
ORAL_TABLET | ORAL | Status: AC
  Filled 2016-02-16: qty 2

## 2016-02-16 MED ORDER — LACTATED RINGERS IV SOLN: INTRAVENOUS | Status: DC

## 2016-02-16 MED ORDER — DEXAMETHASONE SODIUM PHOSPHATE 10 MG/ML IJ SOLN
10.0000 mg | Freq: Two times a day (BID) | INTRAMUSCULAR | Status: AC
  Administered 2016-02-16 – 2016-02-18 (×4): 10 mg via INTRAVENOUS
  Filled 2016-02-16 (×4): qty 1

## 2016-02-16 MED ORDER — IPRATROPIUM-ALBUTEROL 0.5-2.5 (3) MG/3ML IN SOLN: 3.0000 mL | Freq: Three times a day (TID) | RESPIRATORY_TRACT | Status: DC | PRN

## 2016-02-16 MED ORDER — FUROSEMIDE 40 MG PO TABS
40.0000 mg | ORAL_TABLET | Freq: Every day | ORAL | Status: DC
  Administered 2016-02-17 – 2016-02-18 (×2): 40 mg via ORAL
  Filled 2016-02-16 (×2): qty 1

## 2016-02-16 MED ORDER — METHOCARBAMOL 500 MG PO TABS
500.0000 mg | ORAL_TABLET | Freq: Four times a day (QID) | ORAL | Status: DC | PRN
  Administered 2016-02-16 – 2016-02-17 (×2): 500 mg via ORAL
  Filled 2016-02-16: qty 1

## 2016-02-16 MED ORDER — PROPOFOL 10 MG/ML IV BOLUS
INTRAVENOUS | Status: AC
  Filled 2016-02-16: qty 20

## 2016-02-16 MED ORDER — MIDAZOLAM HCL 5 MG/5ML IJ SOLN
INTRAMUSCULAR | Status: DC | PRN
  Administered 2016-02-16: 2 mg via INTRAVENOUS

## 2016-02-16 MED ORDER — PROPOFOL 10 MG/ML IV BOLUS
INTRAVENOUS | Status: DC | PRN
  Administered 2016-02-16: 200 mg via INTRAVENOUS

## 2016-02-16 MED ORDER — LACTATED RINGERS IV SOLN
INTRAVENOUS | Status: DC | PRN
  Administered 2016-02-16 (×3): via INTRAVENOUS

## 2016-02-16 MED ORDER — ALPRAZOLAM 0.5 MG PO TABS: 1.0000 mg | ORAL_TABLET | Freq: Every evening | ORAL | Status: DC | PRN

## 2016-02-16 MED ORDER — BUPROPION HCL ER (XL) 150 MG PO TB24: 150.0000 mg | ORAL_TABLET | Freq: Every day | ORAL | Status: DC

## 2016-02-16 MED ORDER — FENTANYL CITRATE (PF) 100 MCG/2ML IJ SOLN
INTRAMUSCULAR | Status: AC
  Filled 2016-02-16: qty 2

## 2016-02-16 MED ORDER — LIDOCAINE HCL (CARDIAC) 20 MG/ML IV SOLN
INTRAVENOUS | Status: DC | PRN
Start: 2016-02-16 — End: 2016-02-16
  Administered 2016-02-16: 60 mg via INTRAVENOUS

## 2016-02-16 MED ORDER — DOCUSATE SODIUM 100 MG PO CAPS: 100.0000 mg | ORAL_CAPSULE | Freq: Two times a day (BID) | ORAL | 0 refills | Status: DC

## 2016-02-16 MED ORDER — GABAPENTIN 300 MG PO CAPS
300.0000 mg | ORAL_CAPSULE | Freq: Three times a day (TID) | ORAL | Status: DC
  Administered 2016-02-16 – 2016-02-18 (×5): 300 mg via ORAL
  Filled 2016-02-16 (×5): qty 1

## 2016-02-16 MED ORDER — METHOCARBAMOL 750 MG PO TABS
750.0000 mg | ORAL_TABLET | Freq: Three times a day (TID) | ORAL | 0 refills | Status: AC | PRN
Start: 2016-02-16 — End: ?

## 2016-02-16 MED ORDER — IPRATROPIUM-ALBUTEROL 20-100 MCG/ACT IN AERS: 2.0000 | INHALATION_SPRAY | Freq: Three times a day (TID) | RESPIRATORY_TRACT | Status: DC | PRN

## 2016-02-16 MED ORDER — HYDROMORPHONE HCL 2 MG/ML IJ SOLN
0.5000 mg | INTRAMUSCULAR | Status: DC | PRN
Start: 2016-02-16 — End: 2016-02-18
  Administered 2016-02-16 – 2016-02-18 (×5): 1 mg via INTRAVENOUS
  Filled 2016-02-16 (×5): qty 1

## 2016-02-16 MED ORDER — DULOXETINE HCL 60 MG PO CPEP
60.0000 mg | ORAL_CAPSULE | Freq: Two times a day (BID) | ORAL | Status: DC
  Administered 2016-02-16 – 2016-02-18 (×4): 60 mg via ORAL
  Filled 2016-02-16 (×4): qty 1

## 2016-02-16 MED ORDER — LACTATED RINGERS IV SOLN: Freq: Once | INTRAVENOUS | Status: DC

## 2016-02-16 MED ORDER — SUCCINYLCHOLINE CHLORIDE 20 MG/ML IJ SOLN
INTRAMUSCULAR | Status: DC | PRN
  Administered 2016-02-16: 100 mg via INTRAVENOUS

## 2016-02-16 MED ORDER — BISACODYL 5 MG PO TBEC: 5.0000 mg | DELAYED_RELEASE_TABLET | Freq: Every day | ORAL | Status: DC | PRN

## 2016-02-16 MED ORDER — METHOCARBAMOL 1000 MG/10ML IJ SOLN
500.0000 mg | Freq: Four times a day (QID) | INTRAMUSCULAR | Status: DC | PRN
  Filled 2016-02-16: qty 5

## 2016-02-16 MED ORDER — CLINDAMYCIN PHOSPHATE 600 MG/50ML IV SOLN
600.0000 mg | Freq: Four times a day (QID) | INTRAVENOUS | Status: AC
  Administered 2016-02-17 (×2): 600 mg via INTRAVENOUS
  Filled 2016-02-16 (×2): qty 50

## 2016-02-16 MED ORDER — POLYETHYLENE GLYCOL 3350 17 G PO PACK
17.0000 g | PACK | Freq: Every day | ORAL | Status: DC | PRN
Start: 2016-02-16 — End: 2016-02-18

## 2016-02-16 MED ORDER — OXYCODONE HCL 5 MG PO TABS: 5.0000 mg | ORAL_TABLET | ORAL | 0 refills | Status: DC | PRN

## 2016-02-16 MED ORDER — SODIUM CHLORIDE 0.9 % IV SOLN
INTRAVENOUS | Status: DC
  Administered 2016-02-16: 21:00:00 via INTRAVENOUS

## 2016-02-16 MED ORDER — MIDAZOLAM HCL 2 MG/2ML IJ SOLN
INTRAMUSCULAR | Status: AC
  Filled 2016-02-16: qty 2

## 2016-02-16 MED ORDER — GABAPENTIN 300 MG PO CAPS
600.0000 mg | ORAL_CAPSULE | Freq: Once | ORAL | Status: AC
  Administered 2016-02-16: 600 mg via ORAL

## 2016-02-16 MED ORDER — ASPIRIN EC 325 MG PO TBEC
325.0000 mg | DELAYED_RELEASE_TABLET | Freq: Two times a day (BID) | ORAL | Status: DC
  Administered 2016-02-17 – 2016-02-18 (×3): 325 mg via ORAL
  Filled 2016-02-16 (×3): qty 1

## 2016-02-16 MED ORDER — DIPHENHYDRAMINE HCL 12.5 MG/5ML PO ELIX: 12.5000 mg | ORAL_SOLUTION | ORAL | Status: DC | PRN

## 2016-02-16 MED ORDER — ONDANSETRON HCL 4 MG/2ML IJ SOLN: 4.0000 mg | Freq: Four times a day (QID) | INTRAMUSCULAR | Status: DC | PRN

## 2016-02-16 MED ORDER — ASPIRIN EC 325 MG PO TBEC: 325.0000 mg | DELAYED_RELEASE_TABLET | Freq: Two times a day (BID) | ORAL | 0 refills | Status: DC

## 2016-02-16 MED ORDER — METHOCARBAMOL 500 MG PO TABS
ORAL_TABLET | ORAL | Status: AC
  Filled 2016-02-16: qty 1

## 2016-02-16 MED ORDER — ROPIVACAINE HCL 7.5 MG/ML IJ SOLN
INTRAMUSCULAR | Status: DC | PRN
  Administered 2016-02-16: 20 mL via PERINEURAL

## 2016-02-16 MED ORDER — PANTOPRAZOLE SODIUM 40 MG PO TBEC
40.0000 mg | DELAYED_RELEASE_TABLET | Freq: Every day | ORAL | Status: DC
  Administered 2016-02-16 – 2016-02-18 (×3): 40 mg via ORAL
  Filled 2016-02-16 (×3): qty 1

## 2016-02-16 MED ORDER — CELECOXIB 200 MG PO CAPS
200.0000 mg | ORAL_CAPSULE | Freq: Two times a day (BID) | ORAL | Status: DC
  Administered 2016-02-17 – 2016-02-18 (×3): 200 mg via ORAL
  Filled 2016-02-16 (×4): qty 1

## 2016-02-16 MED ORDER — ONDANSETRON HCL 4 MG/2ML IJ SOLN
INTRAMUSCULAR | Status: DC | PRN
  Administered 2016-02-16: 4 mg via INTRAVENOUS

## 2016-02-16 MED ORDER — DOCUSATE SODIUM 100 MG PO CAPS
100.0000 mg | ORAL_CAPSULE | Freq: Two times a day (BID) | ORAL | Status: DC
  Administered 2016-02-16 – 2016-02-18 (×4): 100 mg via ORAL
  Filled 2016-02-16 (×4): qty 1

## 2016-02-16 MED ORDER — TRANEXAMIC ACID 1000 MG/10ML IV SOLN
1000.0000 mg | Freq: Once | INTRAVENOUS | Status: AC
  Administered 2016-02-16: 1000 mg via INTRAVENOUS
  Filled 2016-02-16: qty 10

## 2016-02-16 MED ORDER — FENTANYL CITRATE (PF) 100 MCG/2ML IJ SOLN
INTRAMUSCULAR | Status: AC
  Administered 2016-02-16: 100 ug via INTRAVENOUS
  Filled 2016-02-16: qty 2

## 2016-02-16 MED ORDER — 0.9 % SODIUM CHLORIDE (POUR BTL) OPTIME
TOPICAL | Status: DC | PRN
  Administered 2016-02-16: 1000 mL

## 2016-02-16 MED ORDER — HYDROMORPHONE HCL 1 MG/ML IJ SOLN
0.2500 mg | INTRAMUSCULAR | Status: DC | PRN
  Administered 2016-02-16 (×4): 0.5 mg via INTRAVENOUS

## 2016-02-16 MED ORDER — MIDAZOLAM HCL 2 MG/2ML IJ SOLN
INTRAMUSCULAR | Status: AC
  Administered 2016-02-16: 2 mg via INTRAVENOUS
  Filled 2016-02-16: qty 2

## 2016-02-16 MED ORDER — ONDANSETRON HCL 4 MG PO TABS: 4.0000 mg | ORAL_TABLET | Freq: Four times a day (QID) | ORAL | Status: DC | PRN

## 2016-02-16 MED ORDER — TRANEXAMIC ACID 1000 MG/10ML IV SOLN
1000.0000 mg | INTRAVENOUS | Status: AC
  Administered 2016-02-16: 1000 mg via INTRAVENOUS
  Filled 2016-02-16: qty 10

## 2016-02-16 MED ORDER — TRAZODONE HCL 100 MG PO TABS
100.0000 mg | ORAL_TABLET | Freq: Every day | ORAL | Status: DC
  Administered 2016-02-16 – 2016-02-17 (×2): 100 mg via ORAL
  Filled 2016-02-16 (×2): qty 1

## 2016-02-16 MED ORDER — HYDROMORPHONE HCL 1 MG/ML IJ SOLN
INTRAMUSCULAR | Status: AC
  Filled 2016-02-16: qty 1

## 2016-02-16 MED ORDER — OXYCODONE HCL 5 MG PO TABS
5.0000 mg | ORAL_TABLET | ORAL | Status: DC | PRN
Start: 2016-02-16 — End: 2016-02-18
  Administered 2016-02-16 – 2016-02-17 (×7): 10 mg via ORAL
  Administered 2016-02-18: 5 mg via ORAL
  Administered 2016-02-18 (×2): 10 mg via ORAL
  Filled 2016-02-16 (×9): qty 2

## 2016-02-16 MED ORDER — KETAMINE HCL 10 MG/ML IJ SOLN
INTRAMUSCULAR | Status: DC | PRN
  Administered 2016-02-16 (×2): 10 mg via INTRAVENOUS
  Administered 2016-02-16: 70 mg via INTRAVENOUS
  Administered 2016-02-16: 10 mg via INTRAVENOUS

## 2016-02-16 MED ORDER — FENTANYL CITRATE (PF) 100 MCG/2ML IJ SOLN
100.0000 ug | Freq: Once | INTRAMUSCULAR | Status: AC
  Administered 2016-02-16: 100 ug via INTRAVENOUS

## 2016-02-16 MED ORDER — LAMOTRIGINE 25 MG PO TABS
25.0000 mg | ORAL_TABLET | Freq: Two times a day (BID) | ORAL | Status: DC
  Administered 2016-02-17 – 2016-02-18 (×4): 25 mg via ORAL
  Filled 2016-02-16 (×4): qty 1

## 2016-02-16 MED ORDER — BUPIVACAINE LIPOSOME 1.3 % IJ SUSP
INTRAMUSCULAR | Status: DC | PRN
  Administered 2016-02-16: 20 mL

## 2016-02-16 MED ORDER — SUGAMMADEX SODIUM 200 MG/2ML IV SOLN
INTRAVENOUS | Status: DC | PRN
  Administered 2016-02-16: 260 mg via INTRAVENOUS

## 2016-02-16 MED ORDER — ACETAMINOPHEN 500 MG PO TABS
1000.0000 mg | ORAL_TABLET | Freq: Once | ORAL | Status: AC
  Administered 2016-02-16: 1000 mg via ORAL

## 2016-02-16 MED ORDER — SODIUM CHLORIDE 0.9 % IJ SOLN
INTRAMUSCULAR | Status: DC | PRN
  Administered 2016-02-16: 20 mL

## 2016-02-16 MED ORDER — MAGNESIUM CITRATE PO SOLN: 1.0000 | Freq: Once | ORAL | Status: DC | PRN

## 2016-02-16 MED ORDER — BUPIVACAINE LIPOSOME 1.3 % IJ SUSP
20.0000 mL | INTRAMUSCULAR | Status: DC
  Filled 2016-02-16: qty 20

## 2016-02-16 MED ORDER — VANCOMYCIN HCL IN DEXTROSE 1-5 GM/200ML-% IV SOLN
1000.0000 mg | Freq: Once | INTRAVENOUS | Status: AC
  Administered 2016-02-16: 1000 mg via INTRAVENOUS
  Filled 2016-02-16: qty 200

## 2016-02-16 MED ORDER — CHLORHEXIDINE GLUCONATE 4 % EX LIQD: 60.0000 mL | Freq: Once | CUTANEOUS | Status: DC

## 2016-02-16 MED ORDER — ALUM & MAG HYDROXIDE-SIMETH 200-200-20 MG/5ML PO SUSP: 30.0000 mL | ORAL | Status: DC | PRN

## 2016-02-16 MED ORDER — OXYCODONE HCL 5 MG PO TABS
ORAL_TABLET | ORAL | Status: AC
  Filled 2016-02-16: qty 2

## 2016-02-16 MED ORDER — MIDAZOLAM HCL 2 MG/2ML IJ SOLN
2.0000 mg | Freq: Once | INTRAMUSCULAR | Status: AC
  Administered 2016-02-16: 2 mg via INTRAVENOUS

## 2016-02-16 MED ORDER — CLONAZEPAM 0.5 MG PO TABS
0.5000 mg | ORAL_TABLET | Freq: Three times a day (TID) | ORAL | Status: DC | PRN
  Filled 2016-02-16: qty 1

## 2016-02-16 MED ORDER — KETAMINE HCL-SODIUM CHLORIDE 100-0.9 MG/10ML-% IV SOSY
PREFILLED_SYRINGE | INTRAVENOUS | Status: AC
  Filled 2016-02-16: qty 10

## 2016-02-16 SURGICAL SUPPLY — 57 items
BANDAGE ACE 6X5 VEL STRL LF (GAUZE/BANDAGES/DRESSINGS) ×4 IMPLANT
BANDAGE ESMARK 6X9 LF (GAUZE/BANDAGES/DRESSINGS) ×1 IMPLANT
BENZOIN TINCTURE PRP APPL 2/3 (GAUZE/BANDAGES/DRESSINGS) ×2 IMPLANT
BLADE SAGITTAL 25.0X1.19X90 (BLADE) ×2 IMPLANT
BLADE SAW SAG 90X13X1.27 (BLADE) ×2 IMPLANT
BNDG ESMARK 6X9 LF (GAUZE/BANDAGES/DRESSINGS) ×2
BOWL SMART MIX CTS (DISPOSABLE) ×2 IMPLANT
CAP KNEE TOTAL 3 SIGMA ×2 IMPLANT
CEMENT HV SMART SET (Cement) ×4 IMPLANT
COVER SURGICAL LIGHT HANDLE (MISCELLANEOUS) ×2 IMPLANT
CUFF TOURNIQUET SINGLE 34IN LL (TOURNIQUET CUFF) ×2 IMPLANT
CUFF TOURNIQUET SINGLE 44IN (TOURNIQUET CUFF) IMPLANT
DRAPE EXTREMITY T 121X128X90 (DRAPE) ×2 IMPLANT
DRAPE U-SHAPE 47X51 STRL (DRAPES) ×2 IMPLANT
DRSG AQUACEL AG ADV 3.5X10 (GAUZE/BANDAGES/DRESSINGS) ×2 IMPLANT
DRSG PAD ABDOMINAL 8X10 ST (GAUZE/BANDAGES/DRESSINGS) ×2 IMPLANT
DURAPREP 26ML APPLICATOR (WOUND CARE) ×2 IMPLANT
ELECT REM PT RETURN 9FT ADLT (ELECTROSURGICAL) ×2
ELECTRODE REM PT RTRN 9FT ADLT (ELECTROSURGICAL) ×1 IMPLANT
EVACUATOR 1/8 PVC DRAIN (DRAIN) IMPLANT
FACESHIELD WRAPAROUND (MASK) ×2 IMPLANT
GAUZE SPONGE 4X4 12PLY STRL (GAUZE/BANDAGES/DRESSINGS) ×2 IMPLANT
GLOVE BIOGEL PI IND STRL 8 (GLOVE) ×2 IMPLANT
GLOVE BIOGEL PI INDICATOR 8 (GLOVE) ×2
GLOVE ECLIPSE 7.5 STRL STRAW (GLOVE) ×4 IMPLANT
GOWN STRL REUS W/ TWL LRG LVL3 (GOWN DISPOSABLE) ×1 IMPLANT
GOWN STRL REUS W/ TWL XL LVL3 (GOWN DISPOSABLE) ×2 IMPLANT
GOWN STRL REUS W/TWL LRG LVL3 (GOWN DISPOSABLE) ×1
GOWN STRL REUS W/TWL XL LVL3 (GOWN DISPOSABLE) ×2
HANDPIECE INTERPULSE COAX TIP (DISPOSABLE) ×1
HOOD PEEL AWAY FACE SHEILD DIS (HOOD) ×4 IMPLANT
IMMOBILIZER KNEE 22 UNIV (SOFTGOODS) ×2 IMPLANT
KIT BASIN OR (CUSTOM PROCEDURE TRAY) ×2 IMPLANT
KIT ROOM TURNOVER OR (KITS) ×2 IMPLANT
MANIFOLD NEPTUNE II (INSTRUMENTS) ×2 IMPLANT
NEEDLE 22X1 1/2 (OR ONLY) (NEEDLE) ×2 IMPLANT
NS IRRIG 1000ML POUR BTL (IV SOLUTION) ×2 IMPLANT
PACK TOTAL JOINT (CUSTOM PROCEDURE TRAY) ×2 IMPLANT
PAD ARMBOARD 7.5X6 YLW CONV (MISCELLANEOUS) ×4 IMPLANT
PADDING CAST COTTON 6X4 STRL (CAST SUPPLIES) ×2 IMPLANT
SET HNDPC FAN SPRY TIP SCT (DISPOSABLE) ×1 IMPLANT
STAPLER VISISTAT 35W (STAPLE) IMPLANT
STRIP CLOSURE SKIN 1/2X4 (GAUZE/BANDAGES/DRESSINGS) ×2 IMPLANT
SUCTION FRAZIER HANDLE 10FR (MISCELLANEOUS) ×1
SUCTION TUBE FRAZIER 10FR DISP (MISCELLANEOUS) ×1 IMPLANT
SUT MNCRL AB 3-0 PS2 18 (SUTURE) IMPLANT
SUT VIC AB 0 CTB1 27 (SUTURE) ×4 IMPLANT
SUT VIC AB 1 CT1 27 (SUTURE) ×2
SUT VIC AB 1 CT1 27XBRD ANBCTR (SUTURE) ×2 IMPLANT
SUT VIC AB 2-0 CTB1 (SUTURE) ×4 IMPLANT
SYR 50ML LL SCALE MARK (SYRINGE) ×2 IMPLANT
TOWEL OR 17X24 6PK STRL BLUE (TOWEL DISPOSABLE) ×2 IMPLANT
TOWEL OR 17X26 10 PK STRL BLUE (TOWEL DISPOSABLE) ×2 IMPLANT
TRAY CATH 16FR W/PLASTIC CATH (SET/KITS/TRAYS/PACK) IMPLANT
TRAY FOLEY CATH 16FRSI W/METER (SET/KITS/TRAYS/PACK) IMPLANT
UPCHARGE REV TRAY MBT KNEE ×2 IMPLANT
WRAP KNEE MAXI GEL POST OP (GAUZE/BANDAGES/DRESSINGS) ×2 IMPLANT

## 2016-02-16 NOTE — Anesthesia Preprocedure Evaluation (Addendum)
Anesthesia Evaluation  Patient identified by MRN, date of birth, ID band Patient awake    Reviewed: Allergy & Precautions, H&P , NPO status , Patient's Chart, lab work & pertinent test results  Airway Mallampati: III  TM Distance: >3 FB Neck ROM: Full    Dental no notable dental hx. (+) Poor Dentition, Dental Advisory Given   Pulmonary asthma , Current Smoker,    Pulmonary exam normal breath sounds clear to auscultation       Cardiovascular negative cardio ROS   Rhythm:Regular Rate:Normal     Neuro/Psych  Headaches, Anxiety Depression Bipolar Disorder negative psych ROS   GI/Hepatic Neg liver ROS, GERD  Medicated and Controlled,  Endo/Other  diabetesMorbid obesity  Renal/GU negative Renal ROS  negative genitourinary   Musculoskeletal  (+) Arthritis , Osteoarthritis,    Abdominal   Peds  Hematology negative hematology ROS (+)   Anesthesia Other Findings   Reproductive/Obstetrics negative OB ROS                            Anesthesia Physical Anesthesia Plan  ASA: III  Anesthesia Plan: General   Post-op Pain Management:  Regional for Post-op pain   Induction: Intravenous  Airway Management Planned: LMA and Oral ETT  Additional Equipment:   Intra-op Plan:   Post-operative Plan: Extubation in OR  Informed Consent: I have reviewed the patients History and Physical, chart, labs and discussed the procedure including the risks, benefits and alternatives for the proposed anesthesia with the patient or authorized representative who has indicated his/her understanding and acceptance.   Dental advisory given  Plan Discussed with: CRNA  Anesthesia Plan Comments: (Pt declines spinal.)      Anesthesia Quick Evaluation

## 2016-02-16 NOTE — Anesthesia Postprocedure Evaluation (Signed)
Anesthesia Post Note  Patient: Michelle [name]  Procedure(s) Performed: Procedure(s) (LRB): TOTAL KNEE ARTHROPLASTY (Right)  Patient location during evaluation: PACU Anesthesia Type: General and Regional Level of consciousness: awake and alert Pain management: pain level controlled Vital Signs Assessment: post-procedure vital signs reviewed and stable Respiratory status: spontaneous breathing, nonlabored ventilation, respiratory function stable and patient connected to nasal cannula oxygen Cardiovascular status: blood pressure returned to baseline and stable Postop Assessment: no signs of nausea or vomiting Anesthetic complications: no       Last Vitals:  Vitals:   02/16/16 1735 02/16/16 1750  BP: (!) 183/99 (!) 180/93  Pulse: 67 80  Resp: 13 (!) 21  Temp:      Last Pain:  Vitals:   02/16/16 1754  TempSrc:   PainSc: Asleep        RLE Motor Response: Purposeful movement (02/16/16 1750) RLE Sensation: Full sensation (02/16/16 1750)      Yuritza Paulhus,W. EDMOND

## 2016-02-16 NOTE — Anesthesia Procedure Notes (Signed)
Anesthesia Regional Block:  Adductor canal block  Pre-Anesthetic Checklist: ,, timeout performed, Correct Patient, Correct Site, Correct Laterality, Correct Procedure, Correct Position, site marked, Risks and benefits discussed, pre-op evaluation,  At surgeon's request and post-op pain management  Laterality: Right  Prep: Maximum Sterile Barrier Precautions used, chloraprep       Needles:  Injection technique: Single-shot  Needle Type: Echogenic Stimulator Needle     Needle Length: 9cm 9 cm Needle Gauge: 21 and 21 G    Additional Needles:  Procedures: ultrasound guided (picture in chart) Adductor canal block Narrative:  Start time: 02/16/2016 1:23 PM End time: 02/16/2016 1:33 PM Injection made incrementally with aspirations every 5 mL. Anesthesiologist: Roderic Palau  Additional Notes: 2% Lidocaine skin wheel.

## 2016-02-16 NOTE — Progress Notes (Signed)
Orthopedic Tech Progress Note Patient Details:  Michelle [name] Jul 02, 1965 [id number]  CPM Right Knee CPM Right Knee: On Right Knee Flexion (Degrees): 90 Right Knee Extension (Degrees): 0 Additional Comments: foot roll   Michelle [name] 02/16/2016, 5:54 PM

## 2016-02-16 NOTE — Discharge Instructions (Signed)

## 2016-02-16 NOTE — Transfer of Care (Signed)
Immediate Anesthesia Transfer of Care Note  Patient: Michelle [name]  Procedure(s) Performed: Procedure(s): TOTAL KNEE ARTHROPLASTY (Right)  Patient Location: PACU  Anesthesia Type:General and Regional  Level of Consciousness: awake, alert  and oriented  Airway & Oxygen Therapy: Patient Spontanous Breathing and Patient connected to nasal cannula oxygen  Post-op Assessment: Report given to RN and Post -op Vital signs reviewed and stable  Post vital signs: Reviewed and stable  Last Vitals:  Vitals:   02/16/16 1335 02/16/16 1653  BP: (!) 133/91 95/79  Pulse: 68 79  Resp: 14 16  Temp:  36.3 C    Last Pain:  Vitals:   02/16/16 1659  TempSrc:   PainSc: 10-Worst pain ever         Complications: No apparent anesthesia complications

## 2016-02-16 NOTE — Anesthesia Procedure Notes (Signed)
Procedure Name: Intubation Date/Time: 02/16/2016 2:40 PM Performed by: Clearnce Sorrel Pre-anesthesia Checklist: Patient identified, Suction available, Emergency Drugs available, Patient being monitored and Timeout performed Patient Re-evaluated:Patient Re-evaluated prior to inductionOxygen Delivery Method: Circle system utilized Preoxygenation: Pre-oxygenation with 100% oxygen Intubation Type: IV induction Ventilation: Mask ventilation without difficulty Laryngoscope Size: Mac and 4 Grade View: Grade II Tube type: Oral Tube size: 7.0 mm Number of attempts: 1 Airway Equipment and Method: Stylet Placement Confirmation: ETT inserted through vocal cords under direct vision,  positive ETCO2 and breath sounds checked- equal and bilateral Secured at: 23 cm Tube secured with: Tape Dental Injury: Teeth and Oropharynx as per pre-operative assessment

## 2016-02-16 NOTE — Brief Op Note (Signed)
02/16/2016  4:25 PM  PATIENT:  Michelle [name]  51 y.o. female  PRE-OPERATIVE DIAGNOSIS:  OSTEOARTHRITIS RIGHT KNEE WITH AVASCULAR NECROSIS LATERAL FEMORAL CONDYLE.   POST-OPERATIVE DIAGNOSIS:  * No post-op diagnosis entered *  PROCEDURE:  Procedure(s): TOTAL KNEE ARTHROPLASTY (Right)  SURGEON:  Surgeon(s) and Role:    * Dorna Leitz, MD - Primary  PHYSICIAN ASSISTANT:   ASSISTANTS: bethune   ANESTHESIA:   general  EBL:  Total I/O In: 1000 [I.V.:1000] Out: -   BLOOD ADMINISTERED:none  DRAINS: none   LOCAL MEDICATIONS USED:  MARCAINE    and OTHER experel  SPECIMEN:  No Specimen  DISPOSITION OF SPECIMEN:  N/A  COUNTS:  YES  TOURNIQUET:   Total Tourniquet Time Documented: Thigh (Right) - 63 minutes Total: Thigh (Right) - 63 minutes   DICTATION: .Other Dictation: Dictation Number [id number]  PLAN OF CARE: Admit to inpatient   PATIENT DISPOSITION:  PACU - hemodynamically stable.   Delay start of Pharmacological VTE agent (>24hrs) due to surgical blood loss or risk of bleeding: no

## 2016-02-17 ENCOUNTER — Encounter (HOSPITAL_COMMUNITY): Payer: Self-pay | Admitting: Orthopedic Surgery

## 2016-02-17 LAB — CBC
HCT: 40.8 % (ref 36.0–46.0)
Hemoglobin: 13.6 g/dL (ref 12.0–15.0)
MCH: 30.7 pg (ref 26.0–34.0)
MCHC: 33.3 g/dL (ref 30.0–36.0)
MCV: 92.1 fL (ref 78.0–100.0)
Platelets: 114 10*3/uL — ABNORMAL LOW (ref 150–400)
RBC: 4.43 MIL/uL (ref 3.87–5.11)
RDW: 14.3 % (ref 11.5–15.5)
WBC: 6.1 10*3/uL (ref 4.0–10.5)

## 2016-02-17 LAB — BASIC METABOLIC PANEL
Anion gap: 11 (ref 5–15)
BUN: 6 mg/dL (ref 6–20)
CO2: 28 mmol/L (ref 22–32)
Calcium: 8.8 mg/dL — ABNORMAL LOW (ref 8.9–10.3)
Chloride: 100 mmol/L — ABNORMAL LOW (ref 101–111)
Creatinine, Ser: 1.06 mg/dL — ABNORMAL HIGH (ref 0.44–1.00)
GFR calc Af Amer: >60 mL/min (ref >60)
GFR calc non Af Amer: 60 mL/min — ABNORMAL LOW (ref >60)
Glucose, Bld: 208 mg/dL — ABNORMAL HIGH (ref 65–99)
Potassium: 3.8 mmol/L (ref 3.5–5.1)
Sodium: 139 mmol/L (ref 135–145)

## 2016-02-17 NOTE — Progress Notes (Signed)
Orthopedic Tech Progress Note Patient Details:  Michelle [name] 1965/10/31 [id number]  CPM Right Knee CPM Right Knee: On Right Knee Flexion (Degrees): 90 Right Knee Extension (Degrees): 0 Additional Comments: on at [place] 02/17/2016, 6:37 PM

## 2016-02-17 NOTE — Evaluation (Signed)
Physical Therapy Evaluation Patient Details Name: Michelle [name] MRN: [id number] DOB: 1965/05/20 Today's Date: 02/17/2016   History of Present Illness  51 yo female with onset of TKA RLE after having lossening of components, PMHx:  OA, morbid obesity, DM,   Clinical Impression  Pt was seen for evaluation of her mobility and tolerance for ROM to RLE.  Her plan is to get ready to DC home, and to include step training for safety of the transition back there.  Daughter will be available to assist her and will continue to work on her exercises and standing balance control in preparation for tomorrow.  HHPT to follow up as needed, then may be appropriate for outpatient care.    Follow Up Recommendations Home health PT;Supervision for mobility/OOB    Equipment Recommendations  Rolling walker with 5" wheels;3in1 (PT)    Recommendations for Other Services       Precautions / Restrictions Precautions Precautions: Fall;Knee Precaution Booklet Issued: Yes (comment) Precaution Comments: reviewed knee information includ ex's Restrictions Weight Bearing Restrictions: Yes RLE Weight Bearing: Weight bearing as tolerated      Mobility  Bed Mobility Overal bed mobility: Needs Assistance Bed Mobility: Supine to Sit     Supine to sit: Min assist     General bed mobility comments: HOB elevated and using railing, but needs help wtih RLE  Transfers Overall transfer level: Needs assistance Equipment used: Rolling walker (2 wheeled);1 person hand held assist Transfers: Sit to/from [facility] Sit to Stand: Min assist Stand pivot transfers: Min assist       General transfer comment: cues for hand placement  Ambulation/Gait Ambulation/Gait assistance: Min guard;Min assist Ambulation Distance (Feet): 10 Feet Assistive device: Rolling walker (2 wheeled);1 person hand held assist Gait Pattern/deviations: Step-through pattern;Decreased stride length;Wide base of support;Trunk  flexed;Decreased weight shift to right Gait velocity: reduced Gait velocity interpretation: Below normal speed for age/gender    Stairs            Wheelchair Mobility    Modified Rankin (Stroke Patients Only)       Balance Overall balance assessment: Needs assistance Sitting-balance support: Feet supported Sitting balance-Leahy Scale: Fair   Postural control: Posterior lean Standing balance support: Bilateral upper extremity supported Standing balance-Leahy Scale: Poor Standing balance comment: requires cues and support of walker, reminders for using walker to clean up from [facility]                             Pertinent Vitals/Pain Pain Assessment: 0-10 Pain Score: 6  Pain Location: R knee  Pain Descriptors / Indicators: Aching;Operative site guarding Pain Intervention(s): Limited activity within patient's tolerance;Monitored during session;Premedicated before session;Repositioned;Ice applied    Home Living Family/patient expects to be discharged to:: Private residence Living Arrangements: Children Available Help at Discharge: Family;Available 24 hours/day Type of Home: House Home Access: Stairs to enter Entrance Stairs-Rails: Right Entrance Stairs-Number of Steps: 6 Home Layout: One level Home Equipment: Other (comment) (CPM but no AD's)      Prior Function Level of Independence: Independent               Hand Dominance   Dominant Hand: Right    Extremity/Trunk Assessment   Upper Extremity Assessment Upper Extremity Assessment: Overall WFL for tasks assessed    Lower Extremity Assessment Lower Extremity Assessment: RLE deficits/detail RLE Deficits / Details: in immobilizer for walk, able to assist her to lift it RLE: Unable to fully  assess due to pain;Unable to fully assess due to immobilization RLE Coordination: decreased gross motor;decreased fine motor    Cervical / Trunk Assessment Cervical / Trunk Assessment: Normal   Communication   Communication: No difficulties  Cognition Arousal/Alertness: Awake/alert Behavior During Therapy: WFL for tasks assessed/performed Overall Cognitive Status: Within Functional Limits for tasks assessed                      General Comments      Exercises Total Joint Exercises Ankle Circles/Pumps: AROM;Both;10 reps Quad Sets: AROM;Both;10 reps Goniometric ROM: knee ext -15   Assessment/Plan    PT Assessment Patient needs continued PT services  PT Problem List Decreased strength;Decreased range of motion;Decreased activity tolerance;Decreased balance;Decreased mobility;Decreased coordination;Decreased knowledge of use of DME;Decreased safety awareness;Obesity;Decreased skin integrity;Pain          PT Treatment Interventions DME instruction;Gait training;Stair training;Functional mobility training;Therapeutic activities;Therapeutic exercise;Balance training;Neuromuscular re-education;Patient/family education    PT Goals (Current goals can be found in the Care Plan section)  Acute Rehab PT Goals Patient Stated Goal: to get up steps and get home PT Goal Formulation: With patient Time For Goal Achievement: 03/02/16 Potential to Achieve Goals: Good    Frequency 7X/week   Barriers to discharge Inaccessible home environment several steps to enter house with one rail    Co-evaluation               End of Session Equipment Utilized During Treatment: Gait belt Activity Tolerance: Patient tolerated treatment well;Patient limited by pain;Patient limited by fatigue Patient left: in chair;with call bell/phone within reach Nurse Communication: Mobility status         Time: [id number] PT Time Calculation (min) (ACUTE ONLY): 29 min   Charges:   PT Evaluation $PT Eval Moderate Complexity: 1 Procedure PT Treatments $Gait Training: 8-22 mins   PT G Codes:        Ramond Dial 02/27/2016, 2:52 PM   Mee Hives, PT MS Acute Rehab Dept. Number: [place]

## 2016-02-17 NOTE — Progress Notes (Signed)
Subjective: 1 Day Post-Op Procedure(s) (LRB): TOTAL KNEE ARTHROPLASTY (Right) Patient reports pain as moderate.  Eating and voiding okay. Reports positive flatus.  Objective: Vital signs in last 24 hours: Temp:  [97.2 F (36.2 C)-98.1 F (36.7 C)] 97.8 F (36.6 C) (02/06 0545) Pulse Rate:  [56-82] 62 (02/06 0545) Resp:  [13-21] 18 (02/06 0545) BP: (95-183)/(62-99) 164/62 (02/06 0545) SpO2:  [90 %-100 %] 100 % (02/06 0545) Weight:  [131.1 kg (289 lb)-132.2 kg (291 lb 6.4 oz)] 132.2 kg (291 lb 6.4 oz) (02/05 2000)  Intake/Output from previous day: 02/05 0701 - 02/06 0700 In: 2775 [P.O.:480; I.V.:2245; IV Piggyback:50] Out: 102 [Urine:2; Blood:100] Intake/Output this shift: No intake/output data recorded.   Recent Labs  02/17/16 0759  HGB 13.6    Recent Labs  02/17/16 0759  WBC 6.1  RBC 4.43  HCT 40.8  PLT 114*    Recent Labs  02/17/16 0759  NA 139  K 3.8  CL 100*  CO2 28  BUN 6  CREATININE 1.06*  GLUCOSE 208*  CALCIUM 8.8*   No results for input(s): LABPT, INR in the last 72 hours. Right knee exam: Neurovascular intact Sensation intact distally Intact pulses distally Dorsiflexion/Plantar flexion intact Incision: dressing C/D/I Compartment soft  Assessment/Plan: 1 Day Post-Op Procedure(s) (LRB): TOTAL KNEE ARTHROPLASTY (Right) Plan: Aspirin 325 mg twice daily for DVT prophylaxis 1 month postop. Weight-bear as tolerated on right. Encourage knee range of motion with use of CPM and zeroknee. Up with therapy Plan for discharge tomorrow when passes physical therapy Will need walker and elevated commode seat at time of discharge.  [place] G 02/17/2016, 9:30 AM

## 2016-02-17 NOTE — Op Note (Signed)
NAME:  Michelle [name], Michelle [name]:  [id number]  MEDICAL RECORD NO.:  [id number]  LOCATION:                                 FACILITY:  PHYSICIAN:  Alta Corning, M.D.   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  PROCEDURE:  Right total knee replacement with a Sigma system, size 4 femur, size 4 MBT style revision tray, 10 mm bridging bearing, and a 41 mm all-polyethylene patella.  SURGEON:  Alta Corning, M.D.  ASSISTANT:  Gary Fleet, P.A.  ANESTHESIA:  General.  BRIEF HISTORY:  Ms. Bracker is a 51 year old female with a long history of significant complaints of right knee pain.  She had been treated conservatively for prolonged period of time.  X-ray and MRI showed unfortunately she had AVN of the lateral femoral condyle.  She had significant bone-on-bone change in the lateral compartment with known AVN and after failure of conservative care, she was taken to the operating room for right total knee replacement.  She was having light activity pain, night pain, and had failed injection therapy, an activity modification, and physical therapy.  She was brought to the operating room for right total knee replacement.  DESCRIPTION OF PROCEDURE:  The patient was brought to the operating room.  After adequate anesthesia was obtained with general anesthetic, the patient was placed supine on the operating room table.  The right leg was prepped and draped in usual sterile fashion.  Following this, the leg was exsanguinated.  Blood pressure tourniquet was inflated to 350 mmHg.  Following this, an incision was made for subcutaneous  tissue down all the way to the extensor mechanism.  A medial parapatellar arthrotomy was undertaken.  Following this, medial and lateral meniscus were removed, retropatellar fat pad, synovium on the anterior aspect of the femur, and anterior and posterior cruciates.  Once this was done, attention was turned to the femur where an intramedullary pilot hole was drilled and a long alignment rod was used with a 5-degree valgus alignment and an 11 mm of distal bone was resected.  Following this, attention was turned towards sizing.  She sized to a 4.  Anterior and posterior cuts were made, chamfers and box.  Attention was turned to the tibia.  She sized to a 4.  She was drilled and keeled with an MBT style revision tray.  Once this was done, these trial components were put in place and a 10 spacer was placed.  An excellent range of motion and stability were achieved.  Attention was turned to the patella, it was cut down to a level of 13 mm and a 41 was chosen for the right poly size.  Lugs were drilled and the trial patella was placed.  The tibia was then cut perpendicular to its long axis probably prior to drilling and keeling.  At this point, the attention was turned towards the knee, excellent range of motion  and stability were achieved.  All trial components were removed and following this, the knee was copiously and thoroughly lavaged, pulsatile lavage, irrigation and suctioned dry.  We bathed the bone with an Exparel at this point, then dried it.  We then cemented a size 4 MBT style revision tray on the tibial side.  We went to a size 4 femur on the femoral side and the 10 mm bridging bearing trial was placed.  Attention was turned to the patella, a 41 mm all-poly was placed and held with a clamp and once all excess bone cement had been removed and the cement was completely hardened, tourniquet was let down.  All bleeding was controlled with electrocautery.  The attention was then turned  towards removing the trial components and the final component was placed.  The knee was then closed with 1 Vicryl running from top to bottom.  The subcu was closed with 0 and 2-0 Vicryl and 3-0 Monocryl subcuticular.  Benzoin and Steri-Strips were applied.  Sterile compressive dressing was applied, and the patient was taken to the recovery, was noted to be in satisfactory condition.  Estimated blood loss for the procedure was minimal.  Tourniquet time was 63 minutes.     Alta Corning, M.D.   ______________________________ Alta Corning, M.D.    Corliss Skains  D:  02/16/2016  T:  02/16/2016  Job:  [id number]  cc:   Alta Corning, M.D.

## 2016-02-17 NOTE — Progress Notes (Signed)
Physical Therapy Treatment Patient Details Name: Michelle [name] MRN: [id number] DOB: 1965-01-17 Today's Date: 02/17/2016    History of Present Illness 51 yo female with onset of TKA RLE after having lossening of components, PMHx:  OA, morbid obesity, DM,     PT Comments    Pt is willing to work this afternoon and walked to BR then did there ex on her bed due to fatigue.  Pt is very good worker, demonstrating minimal need for assistance to move.  Will need to try some stairs for home, has set of 6 with one rail at entrance of her home.  Plan to see in AM for stairs before discharge home.  Follow Up Recommendations  Home health PT;Supervision for mobility/OOB     Equipment Recommendations  Rolling walker with 5" wheels;3in1 (PT)    Recommendations for Other Services       Precautions / Restrictions Precautions Precautions: Fall;Knee Precaution Booklet Issued: Yes (comment) Precaution Comments: reviewed knee information includ ex's Restrictions Weight Bearing Restrictions: Yes RLE Weight Bearing: Weight bearing as tolerated    Mobility  Bed Mobility Overal bed mobility: Needs Assistance Bed Mobility: Supine to Sit;Sit to Supine     Supine to sit: Min assist Sit to supine: Min assist   General bed mobility comments: HOB elevated and using railing, but needs help wtih RLE  Transfers Overall transfer level: Needs assistance Equipment used: Rolling walker (2 wheeled);1 person hand held assist Transfers: Sit to/from [facility] Sit to Stand: Min guard;Min assist Stand pivot transfers: Min guard;Min assist          Ambulation/Gait Ambulation/Gait assistance: Min guard Ambulation Distance (Feet): 30 Feet Assistive device: Rolling walker (2 wheeled);1 person hand held assist Gait Pattern/deviations: Step-through pattern;Decreased stride length;Decreased weight shift to right;Wide base of support;Trunk flexed Gait velocity: reduced Gait velocity  interpretation: Below normal speed for age/gender General Gait Details: reciprocal gait with R knee immobilizer, able to maneuver it for set up to sit in BR   Stairs            Wheelchair Mobility    Modified Rankin (Stroke Patients Only)       Balance Overall balance assessment: Needs assistance   Sitting balance-Leahy Scale: Good     Standing balance support: Bilateral upper extremity supported Standing balance-Leahy Scale: Fair Standing balance comment: walker with cues for safety in BR                    Cognition Arousal/Alertness: Awake/alert Behavior During Therapy: WFL for tasks assessed/performed Overall Cognitive Status: Within Functional Limits for tasks assessed                      Exercises Total Joint Exercises Ankle Circles/Pumps: AROM;Both;10 reps Quad Sets: AROM;Both;10 reps Gluteal Sets: AROM;Both;10 reps Heel Slides: AROM;Both;10 reps Hip ABduction/ADduction: AROM;Both;10 reps Knee Flexion: AROM;Both;10 reps Goniometric ROM: knee flexion 80    General Comments        Pertinent Vitals/Pain Pain Assessment: 0-10 Pain Score: 3  Pain Location: R knee  Pain Descriptors / Indicators: Aching;Operative site guarding Pain Intervention(s): Limited activity within patient's tolerance;Monitored during session;Premedicated before session    Home Living                      Prior Function            PT Goals (current goals can now be found in the care plan section) Acute Rehab PT  Goals Patient Stated Goal: to get up steps and get home PT Goal Formulation: With patient Time For Goal Achievement: 03/02/16 Potential to Achieve Goals: Good    Frequency    7X/week      PT Plan      Co-evaluation             End of Session Equipment Utilized During Treatment: Gait belt Activity Tolerance: Patient tolerated treatment well;Patient limited by pain;Patient limited by fatigue Patient left: in chair;with call  bell/phone within reach     Time: 1415-1436 PT Time Calculation (min) (ACUTE ONLY): 21 min  Charges:  $Gait Training: 8-22 mins                    G Codes:      Ramond Dial 03/07/16, 4:58 PM   Mee Hives, PT MS Acute Rehab Dept. Number: [place] and [place]

## 2016-02-17 NOTE — Progress Notes (Signed)
Offered Pt a bath. Pt stated not right now but requested that she wanted to wash her face and hands.

## 2016-02-18 LAB — CBC
HCT: 38 % (ref 36.0–46.0)
Hemoglobin: 12.4 g/dL (ref 12.0–15.0)
MCH: 30 pg (ref 26.0–34.0)
MCHC: 32.6 g/dL (ref 30.0–36.0)
MCV: 91.8 fL (ref 78.0–100.0)
Platelets: 116 10*3/uL — ABNORMAL LOW (ref 150–400)
RBC: 4.14 MIL/uL (ref 3.87–5.11)
RDW: 14.8 % (ref 11.5–15.5)
WBC: 9.5 10*3/uL (ref 4.0–10.5)

## 2016-02-18 MED ORDER — HYDROMORPHONE HCL 2 MG PO TABS: 2.0000 mg | ORAL_TABLET | Freq: Four times a day (QID) | ORAL | 0 refills | Status: DC | PRN

## 2016-02-18 NOTE — Progress Notes (Signed)
Subjective: 2 Days Post-Op Procedure(s) (LRB): TOTAL KNEE ARTHROPLASTY (Right) Patient reports pain as moderate. Taking by mouth and voiding okay. Making progress with physical therapy.   Objective: Vital signs in last 24 hours: Temp:  [98.1 F (36.7 C)-98.8 F (37.1 C)] 98.1 F (36.7 C) (02/07 0423) Pulse Rate:  [57-68] 68 (02/07 0423) Resp:  [18] 18 (02/07 0423) BP: (140-144)/(88-91) 144/88 (02/07 0423) SpO2:  [95 %-96 %] 95 % (02/07 0423)  Intake/Output from previous day: 02/06 0701 - 02/07 0700 In: 600 [P.O.:600] Out: 500 [Urine:500] Intake/Output this shift: Total I/O In: -  Out: 500 [Urine:500]   Recent Labs  02/17/16 0759 02/18/16 0622  HGB 13.6 12.4    Recent Labs  02/17/16 0759 02/18/16 0622  WBC 6.1 9.5  RBC 4.43 4.14  HCT 40.8 38.0  PLT 114* 116*    Recent Labs  02/17/16 0759  NA 139  K 3.8  CL 100*  CO2 28  BUN 6  CREATININE 1.06*  GLUCOSE 208*  CALCIUM 8.8*   No results for input(s): LABPT, INR in the last 72 hours. Right knee exam: Neurovascular intact Sensation intact distally Intact pulses distally Dorsiflexion/Plantar flexion intact Incision: dressing C/D/I Compartment soft  Assessment/Plan: 2 Days Post-Op Procedure(s) (LRB): TOTAL KNEE ARTHROPLASTY (Right)  Plan: Aspirin 325 mg twice daily for DVT prophylaxis 1 month postop. Weight-bear as tolerated on the right. Outer dressing changed. Aqua cell left intact. Up with therapy Discharge home with home health Follow-up with Dr. Berenice Primas in one week. [place] G 02/18/2016, 8:25 AM

## 2016-02-18 NOTE — Discharge Summary (Signed)
Patient ID: GLENDELL GREB MRN: [id number] DOB/AGE: [birth year] 51 y.o.  Admit date: 02/16/2016 Discharge date: 02/18/2016  Admission Diagnoses:  Principal Problem:   Primary osteoarthritis of right knee   Discharge Diagnoses:  Same  Past Medical History:  Diagnosis Date  . Anxiety    panic attacts  . Arthritis    hips, knees, hands and spine  . Asthma    daily inhaler  . Barrett's esophagus   . Bipolar disorder ([place])   . Depression   . GERD (gastroesophageal reflux disease)   . History of acute renal failure 2012   resolved  . History of gastric ulcer   . History of MRSA infection 2014   back  . IBS (irritable bowel syndrome)   . 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 Migraine equivalent    hx  . Obesity   . Pre-diabetes   . Psoriasis    elbows, trunk    Surgeries: Procedure(s):Right TOTAL KNEE ARTHROPLASTY on 02/16/2016   Consultants:   Discharged Condition: Improved  Hospital Course: LOVADA CASERTA is an 51 y.o. female who was admitted 02/16/2016 for operative treatment ofPrimary osteoarthritis of right knee. Patient has severe unremitting pain that affects sleep, daily activities, and work/hobbies. After pre-op clearance the patient was taken to the operating room on 02/16/2016 and underwent  Procedure(s): Right TOTAL KNEE ARTHROPLASTY.    Patient was given perioperative antibiotics: Anti-infectives    Start     Dose/Rate Route Frequency Ordered Stop   02/16/16 2100  clindamycin (CLEOCIN) IVPB 600 mg     600 mg 100 mL/hr over 30 Minutes Intravenous Every 6 hours 02/16/16 1951 02/17/16 0402   02/16/16 2030  vancomycin (VANCOCIN) IVPB 1000 mg/200 mL premix     1,000 mg 200 mL/hr over 60 Minutes Intravenous  Once 02/16/16 1951 02/16/16 2327   02/16/16 1400  clindamycin (CLEOCIN) IVPB 900 mg     900 mg 100 mL/hr over 30 Minutes Intravenous To ShortStay [facility] 02/13/16 1009 02/16/16 1442       Patient was given sequential compression devices, early  ambulation, and chemoprophylaxis to prevent DVT.On the date of discharge the patient's knee dressing was changed the Aquacell  was left in place. Her calf was soft and nontender. Neurovascular status was intact distally. She was progressing very well with physical therapy.  Patient benefited maximally from hospital stay and there were no complications.    Recent vital signs: Patient Vitals for the past 24 hrs:  BP Temp Temp src Pulse Resp SpO2  02/18/16 0423 (!) 144/88 98.1 F (36.7 C) Oral 68 18 95 %  02/17/16 2324 (!) 140/91 98.8 F (37.1 C) Oral (!) 57 - 96 %     Recent laboratory studies:  Recent Labs  02/17/16 0759 02/18/16 0622  WBC 6.1 9.5  HGB 13.6 12.4  HCT 40.8 38.0  PLT 114* 116*  NA 139  --   K 3.8  --   CL 100*  --   CO2 28  --   BUN 6  --   CREATININE 1.06*  --   GLUCOSE 208*  --   CALCIUM 8.8*  --      Discharge Medications:   Allergies as of 02/18/2016      Reactions   Penicillins Itching, Nausea And Vomiting, Rash   Has patient had a PCN reaction causing immediate rash, facial/tongue/throat swelling, SOB or lightheadedness with hypotension: #  #  #  YES  #  #  #  Has patient had a PCN reaction causing severe rash involving mucus membranes or skin necrosis: No Has patient had a PCN reaction that required hospitalization No Has patient had a PCN reaction occurring within the last 10 years: No If all of the above answers are "NO", then may proceed with Cephalosporin use.   Tylenol [acetaminophen] Other (See Comments)   Dr advised not to take due to kidney failure in 12   Codeine Itching      Medication List    STOP taking these medications   gabapentin 600 MG tablet Commonly known as:  NEURONTIN     TAKE these medications   ALPRAZolam 1 MG tablet Commonly known as:  XANAX Take 1 tablet by mouth daily as needed.   aspirin EC 325 MG tablet Take 1 tablet (325 mg total) by mouth 2 (two) times daily after a meal. Take x 1 month post op to decrease  risk of blood clots.   buPROPion 150 MG 24 hr tablet Commonly known as:  WELLBUTRIN XL Take 150 mg by mouth daily.   cholecalciferol 1000 units tablet Commonly known as:  VITAMIN D Take 3,000 Units by mouth daily.   clonazePAM 0.5 MG tablet Commonly known as:  KLONOPIN Take 0.5 mg by mouth 3 times/day as needed-between meals & bedtime for anxiety.   COMBIVENT RESPIMAT 20-100 MCG/ACT Aers respimat Generic drug:  Ipratropium-Albuterol Inhale 2 puffs into the lungs 3 (three) times daily as needed for wheezing.   docusate sodium 100 MG capsule Commonly known as:  COLACE Take 1 capsule (100 mg total) by mouth 2 (two) times daily.   DULoxetine 60 MG capsule Commonly known as:  CYMBALTA Take 60 mg by mouth 2 (two) times daily.   furosemide 40 MG tablet Commonly known as:  LASIX Take 40 mg by mouth.   HYDROmorphone 2 MG tablet Commonly known as:  DILAUDID Take 1-2 tablets (2-4 mg total) by mouth every 6 (six) hours as needed for severe pain (for breakthrough pain.).   lamoTRIgine 25 MG tablet Commonly known as:  LAMICTAL Take 25 mg by mouth 2 (two) times daily.   methocarbamol 750 MG tablet Commonly known as:  ROBAXIN Take 1 tablet (750 mg total) by mouth every 8 (eight) hours as needed for muscle spasms. What changed:  when to take this  reasons to take this   omeprazole 20 MG capsule Commonly known as:  PRILOSEC Take 20 mg by mouth 2 (two) times daily before a meal.   oxyCODONE-acetaminophen 5-325 MG tablet Commonly known as:  PERCOCET 1-2 tabs po q6 hours prn pain   traMADol 50 MG tablet Commonly known as:  ULTRAM Take 1 tablet (50 mg total) by mouth every 6 (six) hours as needed.   traZODone 100 MG tablet Commonly known as:  DESYREL Take 100 mg by mouth at bedtime.       Diagnostic Studies: Dg Chest 2 View  Result Date: 02/11/2016 CLINICAL DATA:  Preoperative examination prior to knee replacement surgery. History of asthma/bronchitis, former smoker, pre  diabetes. EXAM: CHEST  2 VIEW COMPARISON:  PA and lateral chest x-ray of May 25, 2013 FINDINGS: The lungs are well-expanded. The interstitial markings are coarse but stable. There is no alveolar infiltrate or pleural effusion. The heart and pulmonary vascularity are normal. The mediastinum is normal in width. The trachea is midline. There is mild multilevel degenerative disc disease of the thoracic spine. IMPRESSION: Chronic bronchitic changes. No acute pneumonia, CHF, nor other cardiopulmonary abnormality. Electronically Signed  By: David  Martinique M.D.   On: 02/11/2016 16:00    Disposition: 01-Home or Self Care  Discharge Instructions    Call MD / Call 911    Complete by:  As directed    If you experience chest pain or shortness of breath, CALL 911 and be transported to the hospital emergency room.  If you develope a fever above 101 F, pus (white drainage) or increased drainage or redness at the wound, or calf pain, call your surgeon's office.   Constipation Prevention    Complete by:  As directed    Drink plenty of fluids.  Prune juice may be helpful.  You may use a stool softener, such as Colace (over the counter) 100 mg twice a day.  Use MiraLax (over the counter) for constipation as needed.   Diet general    Complete by:  As directed    Do not put a pillow under the knee. Place it under the heel.    Complete by:  As directed    Increase activity slowly as tolerated    Complete by:  As directed    Weight bearing as tolerated    Complete by:  As directed    Laterality:  right   Extremity:  Lower      Follow-up Information    GRAVES,JOHN L, MD. Schedule an appointment as soon as possible for a visit in 1 week.   Specialty:  Orthopedic Surgery Contact information: [place] [address] [phone]            Signed: Erlene Senters 02/18/2016, 11:51 AM

## 2016-02-18 NOTE — Progress Notes (Signed)
Physical Therapy Treatment Patient Details Name: Michelle [name] MRN: [id number] DOB: 02-25-1965 Today's Date: 02/18/2016    History of Present Illness 51 yo female with onset of TKA RLE after having lossening of components, PMHx:  OA, morbid obesity, DM,     PT Comments    Pt ambulated 200' with RW and supervision as well as ascending 5 stairs with one rail as she has at home. Pt safe with mobility at this point and ready to d/c home to continue with HHPT.    Follow Up Recommendations  Home health PT;Supervision for mobility/OOB     Equipment Recommendations  Rolling walker with 5" wheels;3in1 (PT)    Recommendations for Other Services       Precautions / Restrictions Precautions Precautions: Fall;Knee Precaution Comments: pt needed reminding of length of time in CPM, use of zero knee foam, and reasons for proper positioning Required Braces or Orthoses: Knee Immobilizer - Right Knee Immobilizer - Right:  (walking) Restrictions Weight Bearing Restrictions: Yes RLE Weight Bearing: Weight bearing as tolerated    Mobility  Bed Mobility               General bed mobility comments: pt received in chair  Transfers Overall transfer level: Modified independent Equipment used: Rolling walker (2 wheeled) Transfers: Sit to/from Stand Sit to Stand: Modified independent (Device/Increase time)         General transfer comment: mod I from recliner and toilet  Ambulation/Gait Ambulation/Gait assistance: Supervision Ambulation Distance (Feet): 200 Feet Assistive device: Rolling walker (2 wheeled) Gait Pattern/deviations: Step-through pattern;Decreased weight shift to right Gait velocity: decreased Gait velocity interpretation: Below normal speed for age/gender General Gait Details: pt safe with ambulation with RW and able to increase distance today   Stairs Stairs: Yes   Stair Management: One rail Right;Step to pattern;Forwards Number of Stairs: 5 General stair  comments: pt safe on stairs with one rail and feels that she will be able to get into her home  Wheelchair Mobility    Modified Rankin (Stroke Patients Only)       Balance Overall balance assessment: Needs assistance Sitting-balance support: Feet supported Sitting balance-Leahy Scale: Good     Standing balance support: Single extremity supported Standing balance-Leahy Scale: Fair                      Cognition Arousal/Alertness: Awake/alert Behavior During Therapy: WFL for tasks assessed/performed Overall Cognitive Status: Within Functional Limits for tasks assessed                      Exercises Total Joint Exercises Ankle Circles/Pumps: AROM;Both;10 reps Quad Sets: AROM;Both;10 reps Heel Slides: AROM;Right;10 reps;Seated Hip ABduction/ADduction: AAROM;Right;10 reps;Seated Straight Leg Raises: AAROM;Right;10 reps;Seated Long Arc Quad: AROM;Right;10 reps;Seated Goniometric ROM: 15-80    General Comments General comments (skin integrity, edema, etc.): reviewed activity level for home at d/c      Pertinent Vitals/Pain Pain Assessment: 0-10 Pain Score: 8  Pain Location: R knee  Pain Descriptors / Indicators: Aching;Operative site guarding Pain Intervention(s): Limited activity within patient's tolerance;Monitored during session;Premedicated before session    Home Living                      Prior Function            PT Goals (current goals can now be found in the care plan section) Acute Rehab PT Goals Patient Stated Goal: to get up steps and get home  PT Goal Formulation: With patient Time For Goal Achievement: 03/02/16 Potential to Achieve Goals: Good Progress towards PT goals: Progressing toward goals    Frequency    7X/week      PT Plan Current plan remains appropriate    Co-evaluation             End of Session Equipment Utilized During Treatment: Gait belt Activity Tolerance: Patient tolerated treatment  well Patient left: in chair;with call bell/phone within reach     Time: 0859-0943 PT Time Calculation (min) (ACUTE ONLY): 44 min  Charges:  $Gait Training: 23-37 mins $Therapeutic Exercise: 8-22 mins                    G Codes:     Leighton Roach, PT  Acute Rehab Services  [place] 02/18/2016, 9:59 AM

## 2016-02-18 NOTE — Progress Notes (Signed)
Discharge instructions and prescriptions reviewed with patient and patient stated understanding. IV removed. Patient is waiting on transportation to come and take her home

## 2016-09-30 ENCOUNTER — Ambulatory Visit (INDEPENDENT_AMBULATORY_CARE_PROVIDER_SITE_OTHER): Payer: Self-pay | Admitting: Otolaryngology

## 2017-04-25 DIAGNOSIS — Z5181 Encounter for therapeutic drug level monitoring: Secondary | ICD-10-CM | POA: Diagnosis not present

## 2017-04-25 DIAGNOSIS — L409 Psoriasis, unspecified: Secondary | ICD-10-CM | POA: Diagnosis not present

## 2017-08-08 DIAGNOSIS — M79604 Pain in right leg: Secondary | ICD-10-CM | POA: Diagnosis not present

## 2017-08-08 DIAGNOSIS — G894 Chronic pain syndrome: Secondary | ICD-10-CM | POA: Diagnosis not present

## 2017-08-08 DIAGNOSIS — Z7689 Persons encountering health services in other specified circumstances: Secondary | ICD-10-CM | POA: Diagnosis not present

## 2017-08-08 DIAGNOSIS — M545 Low back pain: Secondary | ICD-10-CM | POA: Diagnosis not present

## 2017-08-08 DIAGNOSIS — Z79891 Long term (current) use of opiate analgesic: Secondary | ICD-10-CM | POA: Diagnosis not present

## 2017-09-05 DIAGNOSIS — M79604 Pain in right leg: Secondary | ICD-10-CM | POA: Diagnosis not present

## 2017-09-05 DIAGNOSIS — Z7689 Persons encountering health services in other specified circumstances: Secondary | ICD-10-CM | POA: Diagnosis not present

## 2017-09-05 DIAGNOSIS — Z79891 Long term (current) use of opiate analgesic: Secondary | ICD-10-CM | POA: Diagnosis not present

## 2017-09-05 DIAGNOSIS — G894 Chronic pain syndrome: Secondary | ICD-10-CM | POA: Diagnosis not present

## 2017-09-05 DIAGNOSIS — M545 Low back pain: Secondary | ICD-10-CM | POA: Diagnosis not present

## 2017-09-07 DIAGNOSIS — Z720 Tobacco use: Secondary | ICD-10-CM | POA: Diagnosis not present

## 2017-09-07 DIAGNOSIS — E785 Hyperlipidemia, unspecified: Secondary | ICD-10-CM | POA: Diagnosis not present

## 2017-09-07 DIAGNOSIS — J4520 Mild intermittent asthma, uncomplicated: Secondary | ICD-10-CM | POA: Diagnosis not present

## 2017-09-07 DIAGNOSIS — K219 Gastro-esophageal reflux disease without esophagitis: Secondary | ICD-10-CM | POA: Diagnosis not present

## 2017-09-07 DIAGNOSIS — E559 Vitamin D deficiency, unspecified: Secondary | ICD-10-CM | POA: Diagnosis not present

## 2017-09-07 DIAGNOSIS — G894 Chronic pain syndrome: Secondary | ICD-10-CM | POA: Diagnosis not present

## 2017-09-07 DIAGNOSIS — E1165 Type 2 diabetes mellitus with hyperglycemia: Secondary | ICD-10-CM | POA: Diagnosis not present

## 2017-09-07 DIAGNOSIS — G43111 Migraine with aura, intractable, with status migrainosus: Secondary | ICD-10-CM | POA: Diagnosis not present

## 2017-09-07 DIAGNOSIS — F418 Other specified anxiety disorders: Secondary | ICD-10-CM | POA: Diagnosis not present

## 2017-09-07 DIAGNOSIS — E669 Obesity, unspecified: Secondary | ICD-10-CM | POA: Diagnosis not present

## 2017-09-07 DIAGNOSIS — Z7689 Persons encountering health services in other specified circumstances: Secondary | ICD-10-CM | POA: Diagnosis not present

## 2017-09-07 DIAGNOSIS — L409 Psoriasis, unspecified: Secondary | ICD-10-CM | POA: Diagnosis not present

## 2017-09-13 DIAGNOSIS — Z7689 Persons encountering health services in other specified circumstances: Secondary | ICD-10-CM | POA: Diagnosis not present

## 2017-09-28 DIAGNOSIS — Z7689 Persons encountering health services in other specified circumstances: Secondary | ICD-10-CM | POA: Diagnosis not present

## 2017-10-05 DIAGNOSIS — M79604 Pain in right leg: Secondary | ICD-10-CM | POA: Diagnosis not present

## 2017-10-05 DIAGNOSIS — Z79891 Long term (current) use of opiate analgesic: Secondary | ICD-10-CM | POA: Diagnosis not present

## 2017-10-05 DIAGNOSIS — Z7689 Persons encountering health services in other specified circumstances: Secondary | ICD-10-CM | POA: Diagnosis not present

## 2017-10-05 DIAGNOSIS — G894 Chronic pain syndrome: Secondary | ICD-10-CM | POA: Diagnosis not present

## 2017-10-05 DIAGNOSIS — M545 Low back pain: Secondary | ICD-10-CM | POA: Diagnosis not present

## 2017-11-02 DIAGNOSIS — M79604 Pain in right leg: Secondary | ICD-10-CM | POA: Diagnosis not present

## 2017-11-02 DIAGNOSIS — Z79891 Long term (current) use of opiate analgesic: Secondary | ICD-10-CM | POA: Diagnosis not present

## 2017-11-02 DIAGNOSIS — M545 Low back pain: Secondary | ICD-10-CM | POA: Diagnosis not present

## 2017-11-02 DIAGNOSIS — Z7689 Persons encountering health services in other specified circumstances: Secondary | ICD-10-CM | POA: Diagnosis not present

## 2017-11-02 DIAGNOSIS — G894 Chronic pain syndrome: Secondary | ICD-10-CM | POA: Diagnosis not present

## 2017-11-21 DIAGNOSIS — F418 Other specified anxiety disorders: Secondary | ICD-10-CM | POA: Diagnosis not present

## 2017-11-21 DIAGNOSIS — G43111 Migraine with aura, intractable, with status migrainosus: Secondary | ICD-10-CM | POA: Diagnosis not present

## 2017-11-21 DIAGNOSIS — E559 Vitamin D deficiency, unspecified: Secondary | ICD-10-CM | POA: Diagnosis not present

## 2017-11-21 DIAGNOSIS — Z720 Tobacco use: Secondary | ICD-10-CM | POA: Diagnosis not present

## 2017-11-21 DIAGNOSIS — J4520 Mild intermittent asthma, uncomplicated: Secondary | ICD-10-CM | POA: Diagnosis not present

## 2017-11-21 DIAGNOSIS — E669 Obesity, unspecified: Secondary | ICD-10-CM | POA: Diagnosis not present

## 2017-11-21 DIAGNOSIS — L409 Psoriasis, unspecified: Secondary | ICD-10-CM | POA: Diagnosis not present

## 2017-11-21 DIAGNOSIS — G894 Chronic pain syndrome: Secondary | ICD-10-CM | POA: Diagnosis not present

## 2017-11-21 DIAGNOSIS — Z7689 Persons encountering health services in other specified circumstances: Secondary | ICD-10-CM | POA: Diagnosis not present

## 2017-11-21 DIAGNOSIS — E785 Hyperlipidemia, unspecified: Secondary | ICD-10-CM | POA: Diagnosis not present

## 2017-11-21 DIAGNOSIS — K219 Gastro-esophageal reflux disease without esophagitis: Secondary | ICD-10-CM | POA: Diagnosis not present

## 2017-11-21 DIAGNOSIS — E1165 Type 2 diabetes mellitus with hyperglycemia: Secondary | ICD-10-CM | POA: Diagnosis not present

## 2017-11-30 DIAGNOSIS — Z79891 Long term (current) use of opiate analgesic: Secondary | ICD-10-CM | POA: Diagnosis not present

## 2017-11-30 DIAGNOSIS — G894 Chronic pain syndrome: Secondary | ICD-10-CM | POA: Diagnosis not present

## 2017-11-30 DIAGNOSIS — M79604 Pain in right leg: Secondary | ICD-10-CM | POA: Diagnosis not present

## 2017-11-30 DIAGNOSIS — M545 Low back pain: Secondary | ICD-10-CM | POA: Diagnosis not present

## 2017-11-30 DIAGNOSIS — Z7689 Persons encountering health services in other specified circumstances: Secondary | ICD-10-CM | POA: Diagnosis not present

## 2017-12-27 DIAGNOSIS — Z7689 Persons encountering health services in other specified circumstances: Secondary | ICD-10-CM | POA: Diagnosis not present

## 2017-12-28 DIAGNOSIS — Z79891 Long term (current) use of opiate analgesic: Secondary | ICD-10-CM | POA: Diagnosis not present

## 2017-12-28 DIAGNOSIS — M545 Low back pain: Secondary | ICD-10-CM | POA: Diagnosis not present

## 2017-12-28 DIAGNOSIS — Z7689 Persons encountering health services in other specified circumstances: Secondary | ICD-10-CM | POA: Diagnosis not present

## 2017-12-28 DIAGNOSIS — M79604 Pain in right leg: Secondary | ICD-10-CM | POA: Diagnosis not present

## 2017-12-28 DIAGNOSIS — G894 Chronic pain syndrome: Secondary | ICD-10-CM | POA: Diagnosis not present

## 2018-01-24 DIAGNOSIS — M545 Low back pain: Secondary | ICD-10-CM | POA: Diagnosis not present

## 2018-01-24 DIAGNOSIS — G894 Chronic pain syndrome: Secondary | ICD-10-CM | POA: Diagnosis not present

## 2018-01-24 DIAGNOSIS — M79604 Pain in right leg: Secondary | ICD-10-CM | POA: Diagnosis not present

## 2018-01-24 DIAGNOSIS — Z79891 Long term (current) use of opiate analgesic: Secondary | ICD-10-CM | POA: Diagnosis not present

## 2018-01-24 DIAGNOSIS — Z7689 Persons encountering health services in other specified circumstances: Secondary | ICD-10-CM | POA: Diagnosis not present

## 2018-01-27 DIAGNOSIS — Z7689 Persons encountering health services in other specified circumstances: Secondary | ICD-10-CM | POA: Diagnosis not present

## 2018-02-20 DIAGNOSIS — Z7689 Persons encountering health services in other specified circumstances: Secondary | ICD-10-CM | POA: Diagnosis not present

## 2018-02-22 DIAGNOSIS — Z79891 Long term (current) use of opiate analgesic: Secondary | ICD-10-CM | POA: Diagnosis not present

## 2018-02-22 DIAGNOSIS — M545 Low back pain: Secondary | ICD-10-CM | POA: Diagnosis not present

## 2018-02-22 DIAGNOSIS — Z7689 Persons encountering health services in other specified circumstances: Secondary | ICD-10-CM | POA: Diagnosis not present

## 2018-02-22 DIAGNOSIS — G894 Chronic pain syndrome: Secondary | ICD-10-CM | POA: Diagnosis not present

## 2018-02-22 DIAGNOSIS — M79604 Pain in right leg: Secondary | ICD-10-CM | POA: Diagnosis not present

## 2018-03-22 DIAGNOSIS — M545 Low back pain: Secondary | ICD-10-CM | POA: Diagnosis not present

## 2018-03-22 DIAGNOSIS — M79604 Pain in right leg: Secondary | ICD-10-CM | POA: Diagnosis not present

## 2018-03-22 DIAGNOSIS — Z79891 Long term (current) use of opiate analgesic: Secondary | ICD-10-CM | POA: Diagnosis not present

## 2018-03-22 DIAGNOSIS — Z7689 Persons encountering health services in other specified circumstances: Secondary | ICD-10-CM | POA: Diagnosis not present

## 2018-03-22 DIAGNOSIS — G894 Chronic pain syndrome: Secondary | ICD-10-CM | POA: Diagnosis not present

## 2018-05-05 DIAGNOSIS — G894 Chronic pain syndrome: Secondary | ICD-10-CM | POA: Diagnosis not present

## 2018-05-05 DIAGNOSIS — E669 Obesity, unspecified: Secondary | ICD-10-CM | POA: Diagnosis not present

## 2018-05-05 DIAGNOSIS — K219 Gastro-esophageal reflux disease without esophagitis: Secondary | ICD-10-CM | POA: Diagnosis not present

## 2018-05-05 DIAGNOSIS — Z720 Tobacco use: Secondary | ICD-10-CM | POA: Diagnosis not present

## 2018-05-05 DIAGNOSIS — J0100 Acute maxillary sinusitis, unspecified: Secondary | ICD-10-CM | POA: Diagnosis not present

## 2018-05-05 DIAGNOSIS — F418 Other specified anxiety disorders: Secondary | ICD-10-CM | POA: Diagnosis not present

## 2018-05-05 DIAGNOSIS — I10 Essential (primary) hypertension: Secondary | ICD-10-CM | POA: Diagnosis not present

## 2018-05-05 DIAGNOSIS — J4520 Mild intermittent asthma, uncomplicated: Secondary | ICD-10-CM | POA: Diagnosis not present

## 2018-05-05 DIAGNOSIS — L409 Psoriasis, unspecified: Secondary | ICD-10-CM | POA: Diagnosis not present

## 2018-05-05 DIAGNOSIS — E559 Vitamin D deficiency, unspecified: Secondary | ICD-10-CM | POA: Diagnosis not present

## 2018-05-05 DIAGNOSIS — E785 Hyperlipidemia, unspecified: Secondary | ICD-10-CM | POA: Diagnosis not present

## 2018-05-05 DIAGNOSIS — G43111 Migraine with aura, intractable, with status migrainosus: Secondary | ICD-10-CM | POA: Diagnosis not present

## 2018-05-17 DIAGNOSIS — M545 Low back pain: Secondary | ICD-10-CM | POA: Diagnosis not present

## 2018-05-17 DIAGNOSIS — G894 Chronic pain syndrome: Secondary | ICD-10-CM | POA: Diagnosis not present

## 2018-05-17 DIAGNOSIS — Z79891 Long term (current) use of opiate analgesic: Secondary | ICD-10-CM | POA: Diagnosis not present

## 2018-05-17 DIAGNOSIS — M79604 Pain in right leg: Secondary | ICD-10-CM | POA: Diagnosis not present

## 2018-05-22 ENCOUNTER — Telehealth: Payer: Self-pay

## 2018-05-22 NOTE — Telephone Encounter (Signed)
I contacted the pt and left vm for her to return my call regarding 05/23/18 visit.

## 2018-05-22 NOTE — Telephone Encounter (Signed)
I contacted the pt and left a vm in regards to her 05/23/18 appt. Trying to verify if we could do a virtual video visit. If not we could keep her on the scheduled for face to face visit.

## 2018-05-23 ENCOUNTER — Ambulatory Visit: Payer: Medicaid Other | Admitting: Neurology

## 2018-05-30 NOTE — Addendum Note (Signed)
Addended by: Minna Antis on: 05/30/2018 02:30 PM   Modules accepted: Orders

## 2018-05-30 NOTE — Telephone Encounter (Signed)
Called patient and updated EMR. 

## 2018-05-31 ENCOUNTER — Other Ambulatory Visit: Payer: Self-pay

## 2018-05-31 ENCOUNTER — Encounter: Payer: Self-pay | Admitting: Diagnostic Neuroimaging

## 2018-05-31 ENCOUNTER — Ambulatory Visit (INDEPENDENT_AMBULATORY_CARE_PROVIDER_SITE_OTHER): Payer: Medicaid Other | Admitting: Diagnostic Neuroimaging

## 2018-05-31 DIAGNOSIS — G43109 Migraine with aura, not intractable, without status migrainosus: Secondary | ICD-10-CM

## 2018-05-31 MED ORDER — ERENUMAB-AOOE 70 MG/ML ~~LOC~~ SOAJ: 70.0000 mg | SUBCUTANEOUS | 4 refills | Status: DC

## 2018-05-31 MED ORDER — RIZATRIPTAN BENZOATE 10 MG PO TBDP: 10.0000 mg | ORAL_TABLET | ORAL | 11 refills | Status: DC | PRN

## 2018-05-31 NOTE — Progress Notes (Signed)
[facility]  PATIENT: Michelle [name] DOB: February 11, 1965  REFERRING CLINICIAN: Bonsu HISTORY FROM: patient REASON FOR VISIT: new consult    HISTORICAL  CHIEF COMPLAINT:  Chief Complaint  Patient presents with  . Headache    HISTORY OF PRESENT ILLNESS:   53 year old female here for evaluation of headaches.  Patient has had a since childhood with severe head and scalp pain.  At that time she was told that headaches were related to excessively long hair.  Patient had some nausea with her headaches.  Over time she outgrew these headaches.  Patient also has family history of migraine headaches in her mother.  2008 patient had recurrence of headaches.  This time with frontal and right-sided pain and pressure with throbbing sensation, nausea, sensitivity to light and sound.  Patient has 2-3 severe headaches per month lasting days at a time.  She averages 8 to 12 days of headache per month.  Patient has been diagnosed with migraine headache.  She tried topiramate but this caused nausea.  She tried Imitrex without relief.  She tried over-the-counter medicines including Tylenol ibuprofen without relief.  She is also on Friday medications for mood and pain control including duloxetine, Celexa, oxycodone, Robaxin.   REVIEW OF SYSTEMS: Full 14 system review of systems performed and negative with exception of: As per HPI.  ALLERGIES: Allergies  Allergen Reactions  . Penicillins Itching, Nausea And Vomiting and Rash    Has patient had a PCN reaction causing immediate rash, facial/tongue/throat swelling, SOB or lightheadedness with hypotension: #  #  #  YES  #  #  #  Has patient had a PCN reaction causing severe rash involving mucus membranes or skin necrosis: No Has patient had a PCN reaction that required hospitalization No Has patient had a PCN reaction occurring within the last 10 years: No If all of the above answers are "NO", then may proceed with Cephalosporin use.  . Tylenol [Acetaminophen] Other (See Comments)    Dr advised not to take due to kidney failure in 12  . Codeine Itching    HOME MEDICATIONS: Outpatient Medications Prior to Visit  Medication Sig Dispense Refill  . ALPRAZolam (XANAX) 1 MG tablet Take 1 tablet by mouth daily as needed.  0  . cholecalciferol (VITAMIN D) 1000 units tablet Take 3,000 Units by mouth daily.    . citalopram (CELEXA) 20 MG tablet Take 20 mg by mouth daily.    . DULoxetine (CYMBALTA) 60 MG capsule Take 60 mg by mouth 2 (two) times daily.    . furosemide (LASIX) 40 MG tablet Take 40 mg by mouth.    . hydrochlorothiazide (HYDRODIURIL) 25 MG tablet Take 25 mg by mouth daily.    . Ipratropium-Albuterol (COMBIVENT RESPIMAT) 20-100 MCG/ACT AERS respimat Inhale 2 puffs into the lungs 3 (three) times daily as needed for wheezing.     . lamoTRIgine (LAMICTAL) 25 MG tablet Take 25 mg by mouth 2 (two) times daily.    . methocarbamol (ROBAXIN) 750 MG tablet Take 1 tablet (750 mg total) by mouth every 8 (eight) hours as needed for muscle spasms. (Patient not taking: Reported on 05/30/2018) 50 tablet 0  . omeprazole (PRILOSEC) 20 MG capsule Take 20 mg by mouth 2 (two) times daily before a meal.    . oxyCODONE (ROXICODONE) 5 MG immediate release tablet Take 20 mg by mouth as needed for severe pain. 5 x day as needed    . traMADol (ULTRAM) 50 MG tablet Take 1 tablet (  50 mg total) by mouth every 6 (six) hours as needed. (Patient not taking: Reported on 02/11/2016) 15 tablet 0  . traZODone (DESYREL) 100 MG tablet Take 100 mg by mouth at bedtime.     No facility-administered medications prior to visit.     PAST MEDICAL HISTORY: Past Medical History:  Diagnosis Date  . Anxiety    panic attacts  . Arthritis    hips, knees, hands and spine  . Asthma    daily inhaler  . Barrett's esophagus   . Bipolar disorder ([place])   . Depression   . GERD (gastroesophageal reflux disease)   . History of acute renal failure 2012   resolved  .  History of gastric ulcer   . History of MRSA infection 2014   back  . IBS (irritable bowel syndrome)   . 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 Migraine equivalent    hx  . Obesity   . Pre-diabetes   . Psoriasis    elbows, trunk    PAST SURGICAL HISTORY: Past Surgical History:  Procedure Laterality Date  . ANTERIOR LUMBAR FUSION  12/05/2006   L5-S1  . CARPAL TUNNEL RELEASE Right 08/15/2009  . CARPAL TUNNEL RELEASE  11/23/2010   Procedure: CARPAL TUNNEL RELEASE;  Surgeon: Meredith Pel;  Location: [facility];  Service: Orthopedics;  Laterality: Left;  revision left carpal tunnel release neurolysis of median nerve  . CARPAL TUNNEL RELEASE Left 12/02/2009  . CARPECTOMY  10/05/2011   Procedure: CARPECTOMY;  Surgeon: Tennis Must, MD;  Location: [facility];  Service: Orthopedics;  Laterality: Left;  left proximal row carpectomy bone grafting capitate and radial styloidectomy  . CERVICAL FUSION  1994; 2000  . CHOLECYSTECTOMY  1991  . COLONOSCOPY W/ POLYPECTOMY  06/14/2011   with Propofol  . ESOPHAGOGASTRODUODENOSCOPY  03/20/2011   Procedure: ESOPHAGOGASTRODUODENOSCOPY (EGD);  Surgeon: Irene Shipper, MD;  Location[facility] ENDOSCOPY;  Service: Endoscopy;  Laterality: N/A;  Clarise Cruz /ja  . ESOPHAGOGASTRODUODENOSCOPY (EGD) WITH ESOPHAGEAL DILATION  05/10/2012   with Propofol  . KNEE ARTHROSCOPY Bilateral   . MULTIPLE TOOTH EXTRACTIONS    . TOTAL HIP ARTHROPLASTY Right 06/01/2013   Procedure: POSTERIOR RIGHT TOTAL HIP ARTHROPLASTY;  Surgeon: Alta Corning, MD;  Location: [facility];  Service: Orthopedics;  Laterality: Right;  . TOTAL KNEE ARTHROPLASTY Left 11/28/2007  . TOTAL KNEE ARTHROPLASTY Right 02/16/2016   Procedure: TOTAL KNEE ARTHROPLASTY;  Surgeon: Dorna Leitz, MD;  Location: [facility];  Service: Orthopedics;  Laterality: Right;  . TUBAL LIGATION  1991  . WRIST FUSION Left 08/29/2014   Procedure: LEFT WRIST ARTHRODESIS WRIST;  Surgeon: Leanora Cover, MD;  Location: [facility];  Service: Orthopedics;  Laterality: Left;    FAMILY HISTORY: Family History  Problem Relation Age of Onset  . Esophageal cancer Father   . Heart failure Mother   . Diabetes Mother   . Hypertension Mother   . High Cholesterol Mother   . Clotting disorder Daughter     SOCIAL HISTORY: Social History   Socioeconomic History  . Marital status: Single    Spouse name: Not on file  . Number of children: 2  . Years of education: 66  . Highest education level: Not on file  Occupational History  . Occupation: disabled  Social Needs  . Financial resource strain: Not on file  . Food insecurity:    Worry: Not on file    Inability: Not on file  . Transportation needs:  Medical: Not on file    Non-medical: Not on file  Tobacco Use  . Smoking status: Current Every Day Smoker    Packs/day: 0.50    Years: 33.00    Pack years: 16.50    Types: Cigarettes  . Smokeless tobacco: Never Used  Substance and Sexual Activity  . Alcohol use: Yes    Comment: occasionally  . Drug use: No  . Sexual activity: Not Currently    Birth control/protection: Surgical  Lifestyle  . Physical activity:    Days per week: Not on file    Minutes per session: Not on file  . Stress: Not on file  Relationships  . Social connections:    Talks on phone: Not on file    Gets together: Not on file    Attends religious service: Not on file    Active member of club or organization: Not on file    Attends meetings of clubs or organizations: Not on file    Relationship status: Not on file  . Intimate partner violence:    Fear of current or ex partner: Not on file    Emotionally abused: Not on file    Physically abused: Not on file    Forced sexual activity: Not on file  Other Topics Concern  . Not on file  Social History Narrative   Lives with daughter   Caffeine- coffee 1-2 daily, sodas 5-6 daily     PHYSICAL EXAM   VIDEO EXAM  GENERAL EXAM/CONSTITUTIONAL:  Vitals: There were no vitals  filed for this visit.  There is no height or weight on file to calculate BMI. Wt Readings from Last 3 Encounters:  02/16/16 291 lb 6.4 oz (132.2 kg)  02/11/16 289 lb 6 oz (131.3 kg)  08/29/14 293 lb 2 oz (133 kg)     Patient is in no distress; well developed, nourished and groomed; neck is supple   NEUROLOGIC: MENTAL STATUS:  No flowsheet data found.  awake, alert, oriented to person, place and time  recent and remote memory intact  normal attention and concentration  language fluent, comprehension intact, naming intact  fund of knowledge appropriate  CRANIAL NERVE:   2nd, 3rd, 4th, 6th - visual fields full to confrontation, extraocular muscles intact, no nystagmus  5th - facial sensation symmetric  7th - facial strength symmetric  8th - hearing intact  11th - shoulder shrug symmetric  12th - tongue protrusion midline  MOTOR:   NO TREMOR; NO DRIFT IN BUE  SENSORY:   normal and symmetric to light touch  COORDINATION:   fine finger movements normal     DIAGNOSTIC DATA (LABS, IMAGING, TESTING) - I reviewed patient records, labs, notes, testing and imaging myself where available.  Lab Results  Component Value Date   WBC 9.5 02/18/2016   HGB 12.4 02/18/2016   HCT 38.0 02/18/2016   MCV 91.8 02/18/2016   PLT 116 (L) 02/18/2016      Component Value Date/Time   NA 139 02/17/2016 0759   K 3.8 02/17/2016 0759   CL 100 (L) 02/17/2016 0759   CO2 28 02/17/2016 0759   GLUCOSE 208 (H) 02/17/2016 0759   BUN 6 02/17/2016 0759   CREATININE 1.06 (H) 02/17/2016 0759   CALCIUM 8.8 (L) 02/17/2016 0759   PROT 6.9 02/11/2016 1344   ALBUMIN 3.2 (L) 02/11/2016 1344   AST 23 02/11/2016 1344   ALT 14 02/11/2016 1344   ALKPHOS 96 02/11/2016 1344   BILITOT 0.3 02/11/2016 1344  GFRNONAA 60 (L) 02/17/2016 0759   GFRAA >60 02/17/2016 0759   Lab Results  Component Value Date   CHOL 127 04/26/2008   HDL 46 04/26/2008   LDLCALC 66 04/26/2008   TRIG 73 04/26/2008    CHOLHDL 2.8 Ratio 04/26/2008   Lab Results  Component Value Date   HGBA1C 5.7 (H) 02/11/2016   No results found for: VITAMINB12 No results found for: TSH   05/14/11 CT head [I reviewed images myself and agree with interpretation. -VRP]  - normal   ASSESSMENT AND PLAN  53 y.o. year old female here with migraine with aura.   Meds tried: topiramate, duloxetine, celexa, robaxin, oxycodone, imitrex  Dx:  1. Migraine with aura and without status migrainosus, not intractable    Virtual Visit via Video Note  I connected with Michelle [name] on 05/31/18 at  1:30 PM EDT by a video enabled telemedicine application and verified that I am speaking with the correct person using two identifiers.  Location: Patient: home Provider: patient    I discussed the limitations of evaluation and management by telemedicine and the availability of in person appointments. The patient expressed understanding and agreed to proceed.  I discussed the assessment and treatment plan with the patient. The patient was provided an opportunity to ask questions and all were answered. The patient agreed with the plan and demonstrated an understanding of the instructions.   The patient was advised to call back or seek an in-person evaluation if the symptoms worsen or if the condition fails to improve as anticipated.  I provided 30 minutes of non-face-to-face time during this encounter.   PLAN:  - MIGRAINE PREVENTION --> start CGRP antagonist (aimovig) - MIGRAINE RESCUE --> rizatriptan 10mg  as needed for breakthrough headache; may repeat x 1 after 2 hours; max 2 tabs per day or 8 per month  Meds ordered this encounter  Medications  . rizatriptan (MAXALT-MLT) 10 MG disintegrating tablet    Sig: Take 1 tablet (10 mg total) by mouth as needed for migraine. May repeat in 2 hours if needed    Dispense:  9 tablet    Refill:  11  . Erenumab-aooe (AIMOVIG) 70 MG/ML SOAJ    Sig: Inject 70 mg into the skin  every 30 (thirty) days.    Dispense:  3 pen    Refill:  4   Return in about 4 months (around 10/01/2018) for with NP (Amy Lomax).    Penni Bombard, MD 9/38/1017, 5:10 PM Certified in Neurology, Neurophysiology and Neuroimaging  [facility] [address] [place], [place] [address] [phone]

## 2018-06-14 DIAGNOSIS — M79604 Pain in right leg: Secondary | ICD-10-CM | POA: Diagnosis not present

## 2018-06-14 DIAGNOSIS — M545 Low back pain: Secondary | ICD-10-CM | POA: Diagnosis not present

## 2018-06-14 DIAGNOSIS — G894 Chronic pain syndrome: Secondary | ICD-10-CM | POA: Diagnosis not present

## 2018-06-14 DIAGNOSIS — Z79891 Long term (current) use of opiate analgesic: Secondary | ICD-10-CM | POA: Diagnosis not present

## 2018-07-17 DIAGNOSIS — M79604 Pain in right leg: Secondary | ICD-10-CM | POA: Diagnosis not present

## 2018-07-17 DIAGNOSIS — G894 Chronic pain syndrome: Secondary | ICD-10-CM | POA: Diagnosis not present

## 2018-07-17 DIAGNOSIS — M545 Low back pain: Secondary | ICD-10-CM | POA: Diagnosis not present

## 2018-07-17 DIAGNOSIS — Z79891 Long term (current) use of opiate analgesic: Secondary | ICD-10-CM | POA: Diagnosis not present

## 2018-08-01 ENCOUNTER — Ambulatory Visit
Admission: RE | Admit: 2018-08-01 | Discharge: 2018-08-01 | Disposition: A | Discharge status: Home / Self Care | Payer: Medicaid Other | Source: Ambulatory Visit | Attending: Nurse Practitioner | Admitting: Nurse Practitioner

## 2018-08-01 ENCOUNTER — Other Ambulatory Visit: Payer: Self-pay

## 2018-08-01 ENCOUNTER — Other Ambulatory Visit: Payer: Self-pay | Admitting: Nurse Practitioner

## 2018-08-01 DIAGNOSIS — M79651 Pain in right thigh: Secondary | ICD-10-CM | POA: Diagnosis not present

## 2018-08-01 DIAGNOSIS — M79604 Pain in right leg: Secondary | ICD-10-CM

## 2018-08-09 DIAGNOSIS — M79604 Pain in right leg: Secondary | ICD-10-CM | POA: Diagnosis not present

## 2018-08-09 DIAGNOSIS — G894 Chronic pain syndrome: Secondary | ICD-10-CM | POA: Diagnosis not present

## 2018-08-09 DIAGNOSIS — Z79891 Long term (current) use of opiate analgesic: Secondary | ICD-10-CM | POA: Diagnosis not present

## 2018-08-09 DIAGNOSIS — M545 Low back pain: Secondary | ICD-10-CM | POA: Diagnosis not present

## 2018-09-06 DIAGNOSIS — M79604 Pain in right leg: Secondary | ICD-10-CM | POA: Diagnosis not present

## 2018-09-06 DIAGNOSIS — M545 Low back pain: Secondary | ICD-10-CM | POA: Diagnosis not present

## 2018-09-06 DIAGNOSIS — G894 Chronic pain syndrome: Secondary | ICD-10-CM | POA: Diagnosis not present

## 2018-09-06 DIAGNOSIS — Z79891 Long term (current) use of opiate analgesic: Secondary | ICD-10-CM | POA: Diagnosis not present

## 2018-09-20 DIAGNOSIS — F418 Other specified anxiety disorders: Secondary | ICD-10-CM | POA: Diagnosis not present

## 2018-09-20 DIAGNOSIS — E669 Obesity, unspecified: Secondary | ICD-10-CM | POA: Diagnosis not present

## 2018-09-20 DIAGNOSIS — E785 Hyperlipidemia, unspecified: Secondary | ICD-10-CM | POA: Diagnosis not present

## 2018-09-20 DIAGNOSIS — G43111 Migraine with aura, intractable, with status migrainosus: Secondary | ICD-10-CM | POA: Diagnosis not present

## 2018-09-20 DIAGNOSIS — G894 Chronic pain syndrome: Secondary | ICD-10-CM | POA: Diagnosis not present

## 2018-09-20 DIAGNOSIS — L409 Psoriasis, unspecified: Secondary | ICD-10-CM | POA: Diagnosis not present

## 2018-09-20 DIAGNOSIS — E559 Vitamin D deficiency, unspecified: Secondary | ICD-10-CM | POA: Diagnosis not present

## 2018-09-20 DIAGNOSIS — I10 Essential (primary) hypertension: Secondary | ICD-10-CM | POA: Diagnosis not present

## 2018-09-20 DIAGNOSIS — Z720 Tobacco use: Secondary | ICD-10-CM | POA: Diagnosis not present

## 2018-09-20 DIAGNOSIS — E1165 Type 2 diabetes mellitus with hyperglycemia: Secondary | ICD-10-CM | POA: Diagnosis not present

## 2018-09-20 DIAGNOSIS — J4520 Mild intermittent asthma, uncomplicated: Secondary | ICD-10-CM | POA: Diagnosis not present

## 2018-09-20 DIAGNOSIS — K219 Gastro-esophageal reflux disease without esophagitis: Secondary | ICD-10-CM | POA: Diagnosis not present

## 2018-10-04 DIAGNOSIS — M545 Low back pain: Secondary | ICD-10-CM | POA: Diagnosis not present

## 2018-10-04 DIAGNOSIS — G894 Chronic pain syndrome: Secondary | ICD-10-CM | POA: Diagnosis not present

## 2018-10-04 DIAGNOSIS — Z79891 Long term (current) use of opiate analgesic: Secondary | ICD-10-CM | POA: Diagnosis not present

## 2018-10-04 DIAGNOSIS — M79604 Pain in right leg: Secondary | ICD-10-CM | POA: Diagnosis not present

## 2018-10-30 DIAGNOSIS — Z79891 Long term (current) use of opiate analgesic: Secondary | ICD-10-CM | POA: Diagnosis not present

## 2018-10-30 DIAGNOSIS — M79604 Pain in right leg: Secondary | ICD-10-CM | POA: Diagnosis not present

## 2018-10-30 DIAGNOSIS — G894 Chronic pain syndrome: Secondary | ICD-10-CM | POA: Diagnosis not present

## 2018-10-30 DIAGNOSIS — M545 Low back pain: Secondary | ICD-10-CM | POA: Diagnosis not present

## 2018-11-01 DIAGNOSIS — I10 Essential (primary) hypertension: Secondary | ICD-10-CM | POA: Diagnosis not present

## 2018-11-01 DIAGNOSIS — F418 Other specified anxiety disorders: Secondary | ICD-10-CM | POA: Diagnosis not present

## 2018-11-01 DIAGNOSIS — E785 Hyperlipidemia, unspecified: Secondary | ICD-10-CM | POA: Diagnosis not present

## 2018-11-01 DIAGNOSIS — E559 Vitamin D deficiency, unspecified: Secondary | ICD-10-CM | POA: Diagnosis not present

## 2018-11-01 DIAGNOSIS — G43111 Migraine with aura, intractable, with status migrainosus: Secondary | ICD-10-CM | POA: Diagnosis not present

## 2018-11-01 DIAGNOSIS — L409 Psoriasis, unspecified: Secondary | ICD-10-CM | POA: Diagnosis not present

## 2018-11-01 DIAGNOSIS — Z720 Tobacco use: Secondary | ICD-10-CM | POA: Diagnosis not present

## 2018-11-01 DIAGNOSIS — E1165 Type 2 diabetes mellitus with hyperglycemia: Secondary | ICD-10-CM | POA: Diagnosis not present

## 2018-11-01 DIAGNOSIS — G894 Chronic pain syndrome: Secondary | ICD-10-CM | POA: Diagnosis not present

## 2018-11-01 DIAGNOSIS — K219 Gastro-esophageal reflux disease without esophagitis: Secondary | ICD-10-CM | POA: Diagnosis not present

## 2018-11-01 DIAGNOSIS — J4520 Mild intermittent asthma, uncomplicated: Secondary | ICD-10-CM | POA: Diagnosis not present

## 2018-11-01 DIAGNOSIS — E669 Obesity, unspecified: Secondary | ICD-10-CM | POA: Diagnosis not present

## 2018-11-02 ENCOUNTER — Telehealth: Payer: Self-pay | Admitting: *Deleted

## 2018-11-02 NOTE — Telephone Encounter (Signed)
Received fax from [facility], re: rizatriptan needs PA. Completed [place] [facility] Triptans PA form, signed by Dr Leta Baptist and faxed to [facility].

## 2018-11-06 ENCOUNTER — Encounter: Payer: Self-pay | Admitting: *Deleted

## 2018-11-06 NOTE — Telephone Encounter (Addendum)
Per [place] [employer], rizatriptan approved x 1 year. PA# [id number]. Approval faxed to [facility], Birdie Sons Dr. Durene Cal patient a my chart to inform her.

## 2018-11-08 NOTE — Telephone Encounter (Addendum)
Received fax from [facility], insurance only letting them bill #2 tabs for 30 days. [facility], spoke with Nosha and informed her of [place] [employer] approval and that I faxed notification to them. She stated it still won't go through.  Rx last filled in July, #9 tabs.  Called [place] [employer], spoke with [facility] who stated that the patient is only allowed #12 tab/30 days of the triptan class of drugs. She stated [facility] filed claim for Sumatriptan 9 tabs on 9/9 and Sumatriptan 10 tabs on 10/8.  I noted that sumatriptan is not on her current MAR nor MAR history in Epic. Shawane stated that Ashely Vanstory PA wrote sumatriptan Rx; she is PA at PCP office. Shawane stated that Nina did approve my PA for 9 tabs, so I should tell pharmacy to run Rx in a few days as it may have been too soon. Call interaction ID- [id number]  Called PCP, talked to [facility] and advised him of the two triptan Rx. He took message and stated he would have PA call or he will call back with reply.

## 2018-11-21 NOTE — Telephone Encounter (Addendum)
Received call back from [facility], [place]. He stated that the provider there will stop prescribing Imitrex,and would like Dr Leta Baptist to continue prescribing Rizatriptan. I advised him she has refills, thanked him for call back. He verbalized understanding, appreciation.

## 2018-11-22 NOTE — Telephone Encounter (Addendum)
Received another PA request for rizatriptan. Called [facility], [address], spoke with Lissa Merlin and informed him the PCP discontinued sumatriptan, and rizatriptan is preferred. There shouldn't be issue with refilling Rx. He stated the patient picked up sumatriptan on 11/4, #10 tabs, and has 3 refills remaining.  She has 9 refills of rizatriptan remaining. PCP didn't cancel remaining sumatriptan refills. He stated he can do over ride and refill rizatriptan if needed. I thanked him, called patient and advised her. She stated she can't take Imitrex due to it causing itching, sleepiness. I advised her that [facility] stated she picked up refills of sumatriptan (Imitrex) in Sept, Oct, Nov. She stated she doesn't look inside bag when she goes to pharmacy window, so she wasn't aware it was Imitrex until she got home. She stated she told PCP she couldn't take Imitrex. She stated she will go to pharmacy today and talk to Ferris, or call him and advise him to remove Imitrex Rx. I advised her he stated he could do over ride and give her rizatriptan. I advised I added Imitrex to her allergy list. She verbalized understanding, appreciation.

## 2018-11-27 DIAGNOSIS — M79604 Pain in right leg: Secondary | ICD-10-CM | POA: Diagnosis not present

## 2018-11-27 DIAGNOSIS — Z79891 Long term (current) use of opiate analgesic: Secondary | ICD-10-CM | POA: Diagnosis not present

## 2018-11-27 DIAGNOSIS — M545 Low back pain: Secondary | ICD-10-CM | POA: Diagnosis not present

## 2018-11-27 DIAGNOSIS — G894 Chronic pain syndrome: Secondary | ICD-10-CM | POA: Diagnosis not present

## 2018-12-19 DIAGNOSIS — H52203 Unspecified astigmatism, bilateral: Secondary | ICD-10-CM | POA: Diagnosis not present

## 2018-12-19 DIAGNOSIS — H2513 Age-related nuclear cataract, bilateral: Secondary | ICD-10-CM | POA: Diagnosis not present

## 2018-12-19 DIAGNOSIS — H1045 Other chronic allergic conjunctivitis: Secondary | ICD-10-CM | POA: Diagnosis not present

## 2018-12-19 DIAGNOSIS — H5203 Hypermetropia, bilateral: Secondary | ICD-10-CM | POA: Diagnosis not present

## 2018-12-19 DIAGNOSIS — H524 Presbyopia: Secondary | ICD-10-CM | POA: Diagnosis not present

## 2018-12-20 DIAGNOSIS — H5213 Myopia, bilateral: Secondary | ICD-10-CM | POA: Diagnosis not present

## 2018-12-22 DIAGNOSIS — M79604 Pain in right leg: Secondary | ICD-10-CM | POA: Diagnosis not present

## 2018-12-22 DIAGNOSIS — G894 Chronic pain syndrome: Secondary | ICD-10-CM | POA: Diagnosis not present

## 2018-12-22 DIAGNOSIS — Z79891 Long term (current) use of opiate analgesic: Secondary | ICD-10-CM | POA: Diagnosis not present

## 2018-12-22 DIAGNOSIS — M545 Low back pain: Secondary | ICD-10-CM | POA: Diagnosis not present

## 2018-12-26 DIAGNOSIS — K029 Dental caries, unspecified: Secondary | ICD-10-CM | POA: Diagnosis not present

## 2019-01-17 DIAGNOSIS — H524 Presbyopia: Secondary | ICD-10-CM | POA: Diagnosis not present

## 2019-01-17 DIAGNOSIS — S67192A Crushing injury of right middle finger, initial encounter: Secondary | ICD-10-CM | POA: Diagnosis not present

## 2019-01-17 DIAGNOSIS — S67190A Crushing injury of right index finger, initial encounter: Secondary | ICD-10-CM | POA: Diagnosis not present

## 2019-01-17 DIAGNOSIS — M65312 Trigger thumb, left thumb: Secondary | ICD-10-CM | POA: Diagnosis not present

## 2019-01-22 DIAGNOSIS — M545 Low back pain: Secondary | ICD-10-CM | POA: Diagnosis not present

## 2019-01-22 DIAGNOSIS — M79604 Pain in right leg: Secondary | ICD-10-CM | POA: Diagnosis not present

## 2019-01-22 DIAGNOSIS — Z79891 Long term (current) use of opiate analgesic: Secondary | ICD-10-CM | POA: Diagnosis not present

## 2019-01-22 DIAGNOSIS — G894 Chronic pain syndrome: Secondary | ICD-10-CM | POA: Diagnosis not present

## 2019-02-09 DIAGNOSIS — K219 Gastro-esophageal reflux disease without esophagitis: Secondary | ICD-10-CM | POA: Diagnosis not present

## 2019-02-09 DIAGNOSIS — Z720 Tobacco use: Secondary | ICD-10-CM | POA: Diagnosis not present

## 2019-02-09 DIAGNOSIS — J4520 Mild intermittent asthma, uncomplicated: Secondary | ICD-10-CM | POA: Diagnosis not present

## 2019-02-09 DIAGNOSIS — G894 Chronic pain syndrome: Secondary | ICD-10-CM | POA: Diagnosis not present

## 2019-02-09 DIAGNOSIS — E669 Obesity, unspecified: Secondary | ICD-10-CM | POA: Diagnosis not present

## 2019-02-09 DIAGNOSIS — E785 Hyperlipidemia, unspecified: Secondary | ICD-10-CM | POA: Diagnosis not present

## 2019-02-09 DIAGNOSIS — F418 Other specified anxiety disorders: Secondary | ICD-10-CM | POA: Diagnosis not present

## 2019-02-09 DIAGNOSIS — L409 Psoriasis, unspecified: Secondary | ICD-10-CM | POA: Diagnosis not present

## 2019-02-09 DIAGNOSIS — E1165 Type 2 diabetes mellitus with hyperglycemia: Secondary | ICD-10-CM | POA: Diagnosis not present

## 2019-02-09 DIAGNOSIS — I10 Essential (primary) hypertension: Secondary | ICD-10-CM | POA: Diagnosis not present

## 2019-02-09 DIAGNOSIS — G43111 Migraine with aura, intractable, with status migrainosus: Secondary | ICD-10-CM | POA: Diagnosis not present

## 2019-02-09 DIAGNOSIS — L309 Dermatitis, unspecified: Secondary | ICD-10-CM | POA: Diagnosis not present

## 2019-02-23 DIAGNOSIS — Z79891 Long term (current) use of opiate analgesic: Secondary | ICD-10-CM | POA: Diagnosis not present

## 2019-02-23 DIAGNOSIS — M79604 Pain in right leg: Secondary | ICD-10-CM | POA: Diagnosis not present

## 2019-02-23 DIAGNOSIS — M545 Low back pain: Secondary | ICD-10-CM | POA: Diagnosis not present

## 2019-02-23 DIAGNOSIS — G894 Chronic pain syndrome: Secondary | ICD-10-CM | POA: Diagnosis not present

## 2019-03-23 DIAGNOSIS — M545 Low back pain: Secondary | ICD-10-CM | POA: Diagnosis not present

## 2019-03-23 DIAGNOSIS — M79604 Pain in right leg: Secondary | ICD-10-CM | POA: Diagnosis not present

## 2019-03-23 DIAGNOSIS — G894 Chronic pain syndrome: Secondary | ICD-10-CM | POA: Diagnosis not present

## 2019-03-23 DIAGNOSIS — Z79891 Long term (current) use of opiate analgesic: Secondary | ICD-10-CM | POA: Diagnosis not present

## 2019-04-18 DIAGNOSIS — Z79891 Long term (current) use of opiate analgesic: Secondary | ICD-10-CM | POA: Diagnosis not present

## 2019-04-18 DIAGNOSIS — G894 Chronic pain syndrome: Secondary | ICD-10-CM | POA: Diagnosis not present

## 2019-04-18 DIAGNOSIS — M545 Low back pain: Secondary | ICD-10-CM | POA: Diagnosis not present

## 2019-04-18 DIAGNOSIS — M79604 Pain in right leg: Secondary | ICD-10-CM | POA: Diagnosis not present

## 2019-04-20 ENCOUNTER — Emergency Department (HOSPITAL_COMMUNITY): Payer: Medicaid Other

## 2019-04-20 ENCOUNTER — Encounter (HOSPITAL_COMMUNITY): Payer: Self-pay | Admitting: Emergency Medicine

## 2019-04-20 ENCOUNTER — Other Ambulatory Visit: Payer: Self-pay

## 2019-04-20 ENCOUNTER — Emergency Department (HOSPITAL_COMMUNITY)
Admission: EM | Admit: 2019-04-20 | Discharge: 2019-04-20 | Disposition: A | Discharge status: Home / Self Care | Payer: Medicaid Other | Attending: Emergency Medicine | Admitting: Emergency Medicine

## 2019-04-20 DIAGNOSIS — Y939 Activity, unspecified: Secondary | ICD-10-CM | POA: Diagnosis not present

## 2019-04-20 DIAGNOSIS — S62324A Displaced fracture of shaft of fourth metacarpal bone, right hand, initial encounter for closed fracture: Secondary | ICD-10-CM

## 2019-04-20 DIAGNOSIS — S6991XA Unspecified injury of right wrist, hand and finger(s), initial encounter: Secondary | ICD-10-CM | POA: Diagnosis present

## 2019-04-20 DIAGNOSIS — Y92014 Private driveway to single-family (private) house as the place of occurrence of the external cause: Secondary | ICD-10-CM | POA: Insufficient documentation

## 2019-04-20 DIAGNOSIS — J45909 Unspecified asthma, uncomplicated: Secondary | ICD-10-CM | POA: Insufficient documentation

## 2019-04-20 DIAGNOSIS — Z79899 Other long term (current) drug therapy: Secondary | ICD-10-CM | POA: Diagnosis not present

## 2019-04-20 DIAGNOSIS — S62326A Displaced fracture of shaft of fifth metacarpal bone, right hand, initial encounter for closed fracture: Secondary | ICD-10-CM | POA: Diagnosis not present

## 2019-04-20 DIAGNOSIS — F17210 Nicotine dependence, cigarettes, uncomplicated: Secondary | ICD-10-CM | POA: Insufficient documentation

## 2019-04-20 DIAGNOSIS — S62304A Unspecified fracture of fourth metacarpal bone, right hand, initial encounter for closed fracture: Secondary | ICD-10-CM | POA: Diagnosis not present

## 2019-04-20 DIAGNOSIS — S62306A Unspecified fracture of fifth metacarpal bone, right hand, initial encounter for closed fracture: Secondary | ICD-10-CM | POA: Diagnosis not present

## 2019-04-20 DIAGNOSIS — Y999 Unspecified external cause status: Secondary | ICD-10-CM | POA: Diagnosis not present

## 2019-04-20 MED ORDER — OXYCODONE HCL 5 MG PO TABS
5.0000 mg | ORAL_TABLET | Freq: Once | ORAL | Status: AC
  Administered 2019-04-20: 5 mg via ORAL
  Filled 2019-04-20: qty 1

## 2019-04-20 MED ORDER — OXYCODONE HCL 5 MG PO TABS
5.0000 mg | ORAL_TABLET | Freq: Once | ORAL | Status: AC
  Administered 2019-04-20: 18:00:00 5 mg via ORAL
  Filled 2019-04-20: qty 1
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
## 2019-04-20 NOTE — Discharge Instructions (Signed)
You have been diagnosed today with displaced fracture of the fourth and fifth metacarpal bones of your right hand.  At this time there does not appear to be the presence of an emergent medical condition, however there is always the potential for conditions to change. Please read and follow the below instructions.  Please return to the Emergency Department immediately for any new or worsening symptoms. Please be sure to follow up with your Primary Care Provider within one week regarding your visit today; please call their office to schedule an appointment even if you are feeling better for a follow-up visit. Please call the hand surgeon Dr. Fredna Dow to schedule a follow-up appointment for further evaluation and treatment of the broken bones in your right hand. Please use rest, ice and elevation to help with pain and swelling of your hand. Incidentally your x-ray today showed a chronic fracture with nonunion at the base of your second distal phalanx bone of your right hand, discussed this with Dr. Fredna Dow and your primary care doctor at your follow-up visits.  Get help right away if: You have very bad pain under the cast or in your hand. You have trouble breathing. The following happen, even after you loosen your splint: Your hand or fingernails turn blue or gray. Your hand feels cold or numb. You have any new/concerning or worsening of symptoms  Please read the additional information packets attached to your discharge summary.  Do not take your medicine if  develop an itchy rash, swelling in your mouth or lips, or difficulty breathing; call 911 and seek immediate emergency medical attention if this occurs.  Note: Portions of this text may have been transcribed using voice recognition software. Every effort was made to ensure accuracy; however, inadvertent computerized transcription errors may still be present.

## 2019-04-24 ENCOUNTER — Encounter (HOSPITAL_BASED_OUTPATIENT_CLINIC_OR_DEPARTMENT_OTHER): Payer: Self-pay | Admitting: Orthopedic Surgery

## 2019-04-24 ENCOUNTER — Other Ambulatory Visit: Payer: Self-pay

## 2019-04-24 ENCOUNTER — Other Ambulatory Visit: Payer: Self-pay | Admitting: Orthopedic Surgery

## 2019-04-24 ENCOUNTER — Encounter (HOSPITAL_BASED_OUTPATIENT_CLINIC_OR_DEPARTMENT_OTHER)
Admission: RE | Admit: 2019-04-24 | Discharge: 2019-04-24 | Disposition: A | Discharge status: Home / Self Care | Payer: Medicaid Other | Source: Ambulatory Visit | Attending: Orthopedic Surgery | Admitting: Orthopedic Surgery

## 2019-04-24 ENCOUNTER — Other Ambulatory Visit (HOSPITAL_COMMUNITY)
Admission: RE | Admit: 2019-04-24 | Discharge: 2019-04-24 | Disposition: A | Discharge status: Home / Self Care | Payer: Medicaid Other | Source: Ambulatory Visit | Attending: Orthopedic Surgery | Admitting: Orthopedic Surgery

## 2019-04-24 DIAGNOSIS — Z8711 Personal history of peptic ulcer disease: Secondary | ICD-10-CM | POA: Diagnosis not present

## 2019-04-24 DIAGNOSIS — F419 Anxiety disorder, unspecified: Secondary | ICD-10-CM | POA: Diagnosis not present

## 2019-04-24 DIAGNOSIS — S62326A Displaced fracture of shaft of fifth metacarpal bone, right hand, initial encounter for closed fracture: Secondary | ICD-10-CM | POA: Diagnosis not present

## 2019-04-24 DIAGNOSIS — M931 Kienbock's disease of adults: Secondary | ICD-10-CM | POA: Diagnosis not present

## 2019-04-24 DIAGNOSIS — Z79899 Other long term (current) drug therapy: Secondary | ICD-10-CM | POA: Diagnosis not present

## 2019-04-24 DIAGNOSIS — Z20822 Contact with and (suspected) exposure to covid-19: Secondary | ICD-10-CM | POA: Insufficient documentation

## 2019-04-24 DIAGNOSIS — G43909 Migraine, unspecified, not intractable, without status migrainosus: Secondary | ICD-10-CM | POA: Diagnosis not present

## 2019-04-24 DIAGNOSIS — Z01818 Encounter for other preprocedural examination: Secondary | ICD-10-CM | POA: Diagnosis not present

## 2019-04-24 DIAGNOSIS — Z96641 Presence of right artificial hip joint: Secondary | ICD-10-CM | POA: Diagnosis not present

## 2019-04-24 DIAGNOSIS — K589 Irritable bowel syndrome without diarrhea: Secondary | ICD-10-CM | POA: Diagnosis not present

## 2019-04-24 DIAGNOSIS — Z79891 Long term (current) use of opiate analgesic: Secondary | ICD-10-CM | POA: Diagnosis not present

## 2019-04-24 DIAGNOSIS — M159 Polyosteoarthritis, unspecified: Secondary | ICD-10-CM | POA: Diagnosis not present

## 2019-04-24 DIAGNOSIS — Z96653 Presence of artificial knee joint, bilateral: Secondary | ICD-10-CM | POA: Diagnosis not present

## 2019-04-24 DIAGNOSIS — I10 Essential (primary) hypertension: Secondary | ICD-10-CM | POA: Diagnosis not present

## 2019-04-24 DIAGNOSIS — Z6841 Body Mass Index (BMI) 40.0 and over, adult: Secondary | ICD-10-CM | POA: Diagnosis not present

## 2019-04-24 DIAGNOSIS — F319 Bipolar disorder, unspecified: Secondary | ICD-10-CM | POA: Diagnosis not present

## 2019-04-24 DIAGNOSIS — E669 Obesity, unspecified: Secondary | ICD-10-CM | POA: Diagnosis not present

## 2019-04-24 DIAGNOSIS — K219 Gastro-esophageal reflux disease without esophagitis: Secondary | ICD-10-CM | POA: Diagnosis not present

## 2019-04-24 DIAGNOSIS — R7303 Prediabetes: Secondary | ICD-10-CM | POA: Diagnosis not present

## 2019-04-24 DIAGNOSIS — S62324A Displaced fracture of shaft of fourth metacarpal bone, right hand, initial encounter for closed fracture: Secondary | ICD-10-CM | POA: Diagnosis not present

## 2019-04-24 DIAGNOSIS — J45909 Unspecified asthma, uncomplicated: Secondary | ICD-10-CM | POA: Diagnosis not present

## 2019-04-24 DIAGNOSIS — Z981 Arthrodesis status: Secondary | ICD-10-CM | POA: Diagnosis not present

## 2019-04-24 DIAGNOSIS — F17210 Nicotine dependence, cigarettes, uncomplicated: Secondary | ICD-10-CM | POA: Diagnosis not present

## 2019-04-24 LAB — BASIC METABOLIC PANEL
Anion gap: 8 (ref 5–15)
BUN: 9 mg/dL (ref 6–20)
CO2: 34 mmol/L — ABNORMAL HIGH (ref 22–32)
Calcium: 8.6 mg/dL — ABNORMAL LOW (ref 8.9–10.3)
Chloride: 97 mmol/L — ABNORMAL LOW (ref 98–111)
Creatinine, Ser: 1.05 mg/dL — ABNORMAL HIGH (ref 0.44–1.00)
GFR calc Af Amer: >60 mL/min (ref >60)
GFR calc non Af Amer: >60 mL/min (ref >60)
Glucose, Bld: 126 mg/dL — ABNORMAL HIGH (ref 70–99)
Potassium: 3.6 mmol/L (ref 3.5–5.1)
Sodium: 139 mmol/L (ref 135–145)

## 2019-04-24 LAB — SARS CORONAVIRUS 2 (TAT 6-24 HRS): SARS Coronavirus 2: NEGATIVE

## 2019-04-24 NOTE — Progress Notes (Signed)

## 2019-04-26 ENCOUNTER — Encounter (HOSPITAL_BASED_OUTPATIENT_CLINIC_OR_DEPARTMENT_OTHER)
Admission: RE | Disposition: A | Discharge status: Home / Self Care | Payer: Self-pay | Source: Home / Self Care | Attending: Orthopedic Surgery

## 2019-04-26 ENCOUNTER — Other Ambulatory Visit: Payer: Self-pay

## 2019-04-26 ENCOUNTER — Ambulatory Visit (HOSPITAL_BASED_OUTPATIENT_CLINIC_OR_DEPARTMENT_OTHER): Payer: Medicaid Other | Admitting: Anesthesiology

## 2019-04-26 ENCOUNTER — Ambulatory Visit (HOSPITAL_BASED_OUTPATIENT_CLINIC_OR_DEPARTMENT_OTHER)
Admission: RE | Admit: 2019-04-26 | Discharge: 2019-04-26 | Disposition: A | Discharge status: Home / Self Care | Payer: Medicaid Other | Attending: Orthopedic Surgery | Admitting: Orthopedic Surgery

## 2019-04-26 ENCOUNTER — Encounter (HOSPITAL_BASED_OUTPATIENT_CLINIC_OR_DEPARTMENT_OTHER): Payer: Self-pay | Admitting: Orthopedic Surgery

## 2019-04-26 DIAGNOSIS — M931 Kienbock's disease of adults: Secondary | ICD-10-CM | POA: Insufficient documentation

## 2019-04-26 DIAGNOSIS — R7303 Prediabetes: Secondary | ICD-10-CM | POA: Insufficient documentation

## 2019-04-26 DIAGNOSIS — F319 Bipolar disorder, unspecified: Secondary | ICD-10-CM | POA: Diagnosis not present

## 2019-04-26 DIAGNOSIS — K219 Gastro-esophageal reflux disease without esophagitis: Secondary | ICD-10-CM | POA: Insufficient documentation

## 2019-04-26 DIAGNOSIS — E1165 Type 2 diabetes mellitus with hyperglycemia: Secondary | ICD-10-CM | POA: Diagnosis not present

## 2019-04-26 DIAGNOSIS — Z6841 Body Mass Index (BMI) 40.0 and over, adult: Secondary | ICD-10-CM | POA: Insufficient documentation

## 2019-04-26 DIAGNOSIS — J45909 Unspecified asthma, uncomplicated: Secondary | ICD-10-CM | POA: Insufficient documentation

## 2019-04-26 DIAGNOSIS — Z79899 Other long term (current) drug therapy: Secondary | ICD-10-CM | POA: Insufficient documentation

## 2019-04-26 DIAGNOSIS — E669 Obesity, unspecified: Secondary | ICD-10-CM | POA: Insufficient documentation

## 2019-04-26 DIAGNOSIS — I10 Essential (primary) hypertension: Secondary | ICD-10-CM | POA: Insufficient documentation

## 2019-04-26 DIAGNOSIS — M159 Polyosteoarthritis, unspecified: Secondary | ICD-10-CM | POA: Insufficient documentation

## 2019-04-26 DIAGNOSIS — S62324A Displaced fracture of shaft of fourth metacarpal bone, right hand, initial encounter for closed fracture: Secondary | ICD-10-CM | POA: Insufficient documentation

## 2019-04-26 DIAGNOSIS — Z981 Arthrodesis status: Secondary | ICD-10-CM | POA: Insufficient documentation

## 2019-04-26 DIAGNOSIS — Z96653 Presence of artificial knee joint, bilateral: Secondary | ICD-10-CM | POA: Insufficient documentation

## 2019-04-26 DIAGNOSIS — Z96641 Presence of right artificial hip joint: Secondary | ICD-10-CM | POA: Insufficient documentation

## 2019-04-26 DIAGNOSIS — Z79891 Long term (current) use of opiate analgesic: Secondary | ICD-10-CM | POA: Insufficient documentation

## 2019-04-26 DIAGNOSIS — F419 Anxiety disorder, unspecified: Secondary | ICD-10-CM | POA: Diagnosis not present

## 2019-04-26 DIAGNOSIS — Z8711 Personal history of peptic ulcer disease: Secondary | ICD-10-CM | POA: Diagnosis not present

## 2019-04-26 DIAGNOSIS — S62326A Displaced fracture of shaft of fifth metacarpal bone, right hand, initial encounter for closed fracture: Secondary | ICD-10-CM | POA: Diagnosis not present

## 2019-04-26 DIAGNOSIS — G43909 Migraine, unspecified, not intractable, without status migrainosus: Secondary | ICD-10-CM | POA: Insufficient documentation

## 2019-04-26 DIAGNOSIS — F418 Other specified anxiety disorders: Secondary | ICD-10-CM | POA: Diagnosis not present

## 2019-04-26 DIAGNOSIS — K589 Irritable bowel syndrome without diarrhea: Secondary | ICD-10-CM | POA: Diagnosis not present

## 2019-04-26 DIAGNOSIS — F17210 Nicotine dependence, cigarettes, uncomplicated: Secondary | ICD-10-CM | POA: Insufficient documentation

## 2019-04-26 HISTORY — DX: Essential (primary) hypertension: I10

## 2019-04-26 HISTORY — PX: OPEN REDUCTION INTERNAL FIXATION (ORIF) METACARPAL: SHX6234

## 2019-04-26 SURGERY — OPEN REDUCTION INTERNAL FIXATION (ORIF) METACARPAL
Anesthesia: Monitor Anesthesia Care | Site: Finger | Laterality: Right

## 2019-04-26 MED ORDER — OXYCODONE HCL 5 MG PO TABS: 5.0000 mg | ORAL_TABLET | Freq: Once | ORAL | Status: DC | PRN

## 2019-04-26 MED ORDER — OXYCODONE HCL 5 MG PO TABS: ORAL_TABLET | ORAL | 0 refills | Status: DC

## 2019-04-26 MED ORDER — FENTANYL CITRATE (PF) 100 MCG/2ML IJ SOLN
INTRAMUSCULAR | Status: AC
  Filled 2019-04-26: qty 2

## 2019-04-26 MED ORDER — HYDROMORPHONE HCL 1 MG/ML IJ SOLN: 0.2500 mg | INTRAMUSCULAR | Status: DC | PRN

## 2019-04-26 MED ORDER — DEXMEDETOMIDINE HCL 200 MCG/2ML IV SOLN
INTRAVENOUS | Status: DC | PRN
  Administered 2019-04-26 (×2): 8 ug via INTRAVENOUS
  Administered 2019-04-26: 12 ug via INTRAVENOUS

## 2019-04-26 MED ORDER — VANCOMYCIN HCL IN DEXTROSE 1-5 GM/200ML-% IV SOLN
INTRAVENOUS | Status: AC
  Filled 2019-04-26: qty 200

## 2019-04-26 MED ORDER — MIDAZOLAM HCL 2 MG/2ML IJ SOLN
1.0000 mg | INTRAMUSCULAR | Status: DC | PRN
  Administered 2019-04-26: 2 mg via INTRAVENOUS

## 2019-04-26 MED ORDER — FENTANYL CITRATE (PF) 100 MCG/2ML IJ SOLN
50.0000 ug | INTRAMUSCULAR | Status: AC | PRN
  Administered 2019-04-26 (×2): 50 ug via INTRAVENOUS
  Administered 2019-04-26: 100 ug via INTRAVENOUS

## 2019-04-26 MED ORDER — OXYCODONE HCL 5 MG/5ML PO SOLN: 5.0000 mg | Freq: Once | ORAL | Status: DC | PRN

## 2019-04-26 MED ORDER — VANCOMYCIN HCL IN DEXTROSE 1-5 GM/200ML-% IV SOLN
1000.0000 mg | INTRAVENOUS | Status: AC
  Administered 2019-04-26: 1000 mg via INTRAVENOUS

## 2019-04-26 MED ORDER — PROPOFOL 500 MG/50ML IV EMUL
INTRAVENOUS | Status: DC | PRN
  Administered 2019-04-26: 25 ug/kg/min via INTRAVENOUS

## 2019-04-26 MED ORDER — LACTATED RINGERS IV SOLN: INTRAVENOUS | Status: DC

## 2019-04-26 MED ORDER — MIDAZOLAM HCL 2 MG/2ML IJ SOLN
INTRAMUSCULAR | Status: AC
  Filled 2019-04-26: qty 2

## 2019-04-26 MED ORDER — PROMETHAZINE HCL 25 MG/ML IJ SOLN: 6.2500 mg | INTRAMUSCULAR | Status: DC | PRN

## 2019-04-26 MED ORDER — ROPIVACAINE HCL 5 MG/ML IJ SOLN
INTRAMUSCULAR | Status: DC | PRN
  Administered 2019-04-26: 30 mL via PERINEURAL

## 2019-04-26 SURGICAL SUPPLY — 59 items
BIT DRILL .045X (BIT) IMPLANT
BIT DRILL 1.1 (BIT) ×2
BIT DRL .045X (BIT) ×1
BLADE SURG 15 STRL LF DISP TIS (BLADE) ×2 IMPLANT
BLADE SURG 15 STRL SS (BLADE) ×4
BNDG ELASTIC 3X5.8 VLCR STR LF (GAUZE/BANDAGES/DRESSINGS) ×2 IMPLANT
BNDG ESMARK 4X9 LF (GAUZE/BANDAGES/DRESSINGS) ×2 IMPLANT
BNDG GAUZE ELAST 4 BULKY (GAUZE/BANDAGES/DRESSINGS) ×2 IMPLANT
CHLORAPREP W/TINT 26 (MISCELLANEOUS) ×2 IMPLANT
CORD BIPOLAR FORCEPS 12FT (ELECTRODE) ×2 IMPLANT
COVER BACK TABLE 60X90IN (DRAPES) ×2 IMPLANT
COVER MAYO STAND STRL (DRAPES) ×2 IMPLANT
COVER WAND RF STERILE (DRAPES) IMPLANT
CUFF TOURN SGL QUICK 18X4 (TOURNIQUET CUFF) ×2 IMPLANT
DRAPE EXTREMITY T 121X128X90 (DISPOSABLE) ×2 IMPLANT
DRAPE OEC MINIVIEW 54X84 (DRAPES) ×2 IMPLANT
DRAPE SURG 17X23 STRL (DRAPES) ×2 IMPLANT
GAUZE SPONGE 4X4 12PLY STRL (GAUZE/BANDAGES/DRESSINGS) ×2 IMPLANT
GAUZE XEROFORM 1X8 LF (GAUZE/BANDAGES/DRESSINGS) ×2 IMPLANT
GLOVE BIO SURGEON STRL SZ7.5 (GLOVE) ×2 IMPLANT
GLOVE BIOGEL PI IND STRL 6.5 (GLOVE) IMPLANT
GLOVE BIOGEL PI IND STRL 7.5 (GLOVE) IMPLANT
GLOVE BIOGEL PI IND STRL 8 (GLOVE) ×1 IMPLANT
GLOVE BIOGEL PI INDICATOR 6.5 (GLOVE) ×1
GLOVE BIOGEL PI INDICATOR 7.5 (GLOVE) ×1
GLOVE BIOGEL PI INDICATOR 8 (GLOVE) ×1
GLOVE ECLIPSE 8.0 STRL XLNG CF (GLOVE) ×1 IMPLANT
GLOVE SURG SS PI 7.0 STRL IVOR (GLOVE) ×1 IMPLANT
GOWN STRL REUS W/ TWL LRG LVL3 (GOWN DISPOSABLE) ×1 IMPLANT
GOWN STRL REUS W/TWL LRG LVL3 (GOWN DISPOSABLE) ×6
GOWN STRL REUS W/TWL XL LVL3 (GOWN DISPOSABLE) ×3 IMPLANT
NDL HYPO 25X1 1.5 SAFETY (NEEDLE) IMPLANT
NEEDLE HYPO 25X1 1.5 SAFETY (NEEDLE) IMPLANT
NS IRRIG 1000ML POUR BTL (IV SOLUTION) ×2 IMPLANT
PACK BASIN DAY SURGERY FS (CUSTOM PROCEDURE TRAY) ×2 IMPLANT
PAD CAST 4YDX4 CTTN HI CHSV (CAST SUPPLIES) ×1 IMPLANT
PADDING CAST COTTON 4X4 STRL (CAST SUPPLIES) ×2
PLATE BRIDGE STR 1.4 6H (Plate) ×1 IMPLANT
SCREW 1.4X8 (Screw) ×6 IMPLANT
SCREW 1.4X9 (Screw) ×1 IMPLANT
SCREW CORT 1.4X7 (Screw) ×3 IMPLANT
SLEEVE SCD COMPRESS KNEE MED (MISCELLANEOUS) IMPLANT
SLING ARM FOAM STRAP XLG (SOFTGOODS) ×1 IMPLANT
SPLINT PLASTER CAST XFAST 3X15 (CAST SUPPLIES) IMPLANT
SPLINT PLASTER CAST XFAST 4X15 (CAST SUPPLIES) IMPLANT
SPLINT PLASTER XTRA FAST SET 4 (CAST SUPPLIES)
SPLINT PLASTER XTRA FASTSET 3X (CAST SUPPLIES) ×20
STOCKINETTE 4X48 STRL (DRAPES) ×2 IMPLANT
SUT CHROMIC 4 0 PS 2 18 (SUTURE) ×2 IMPLANT
SUT ETHILON 3 0 PS 1 (SUTURE) IMPLANT
SUT ETHILON 4 0 PS 2 18 (SUTURE) ×2 IMPLANT
SUT MERSILENE 4 0 P 3 (SUTURE) IMPLANT
SUT VIC AB 3-0 PS1 18 (SUTURE)
SUT VIC AB 3-0 PS1 18XBRD (SUTURE) IMPLANT
SUT VICRYL 4-0 PS2 18IN ABS (SUTURE) ×2 IMPLANT
SYR BULB 3OZ (MISCELLANEOUS) ×2 IMPLANT
SYR CONTROL 10ML LL (SYRINGE) IMPLANT
TOWEL GREEN STERILE FF (TOWEL DISPOSABLE) ×4 IMPLANT
UNDERPAD 30X36 HEAVY ABSORB (UNDERPADS AND DIAPERS) ×2 IMPLANT

## 2019-04-26 NOTE — Anesthesia Procedure Notes (Signed)
Anesthesia Regional Block: Supraclavicular block   Pre-Anesthetic Checklist: ,, timeout performed, Correct Patient, Correct Site, Correct Laterality, Correct Procedure, Correct Position, site marked, Risks and benefits discussed,  Surgical consent,  Pre-op evaluation,  At surgeon's request and post-op pain management  Laterality: Right  Prep: chloraprep       Needles:  Injection technique: Single-shot  Needle Type: Stimiplex     Needle Length: 9cm  Needle Gauge: 21     Additional Needles:   Procedures:,,,, ultrasound used (permanent image in chart),,,,  Narrative:  Start time: 04/26/2019 12:06 PM End time: 04/26/2019 12:11 PM Injection made incrementally with aspirations every 5 mL.  Performed by: Personally  Anesthesiologist: Lynda Rainwater, MD

## 2019-04-26 NOTE — Progress Notes (Signed)
Assisted Dr. Miller with right, ultrasound guided, supraclavicular block. Side rails up, monitors on throughout procedure. See vital signs in flow sheet. Tolerated Procedure well. 
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
## 2019-04-26 NOTE — Transfer of Care (Signed)
Immediate Anesthesia Transfer of Care Note  Patient: Michelle [name]  Procedure(s) Performed: OPEN REDUCTION INTERNAL FIXATION (ORIF) RIGHT RING AND RIGHT SMALL METACARPAL FRACTURE (Right Finger)  Patient Location: PACU  Anesthesia Type:MAC combined with regional for post-op pain  Level of Consciousness: awake, alert , oriented and patient cooperative  Airway & Oxygen Therapy: Patient Spontanous Breathing and Patient connected to nasal cannula oxygen  Post-op Assessment: Report given to RN and Post -op Vital signs reviewed and stable  Post vital signs: Reviewed and stable  Last Vitals:  Vitals Value Taken Time  BP 117/79 04/26/19 1412  Temp    Pulse 63 04/26/19 1415  Resp 17 04/26/19 1415  SpO2 96 % 04/26/19 1415  Vitals shown include unvalidated device data.  Last Pain:  Vitals:   04/26/19 1111  TempSrc: Tympanic  PainSc: 10-Worst pain ever      Patients Stated Pain Goal: 5 (19/41/74 0814)  Complications: No apparent anesthesia complications
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
## 2019-04-26 NOTE — Anesthesia Postprocedure Evaluation (Signed)
Anesthesia Post Note  Patient: Michelle [name]  Procedure(s) Performed: OPEN REDUCTION INTERNAL FIXATION (ORIF) RIGHT RING AND RIGHT SMALL METACARPAL FRACTURE (Right Finger)     Patient location during evaluation: PACU Anesthesia Type: Regional and MAC Level of consciousness: awake and alert Pain management: pain level controlled Vital Signs Assessment: post-procedure vital signs reviewed and stable Respiratory status: spontaneous breathing, nonlabored ventilation and respiratory function stable Cardiovascular status: blood pressure returned to baseline and stable Postop Assessment: no apparent nausea or vomiting Anesthetic complications: no    Last Vitals:  Vitals:   04/26/19 1430 04/26/19 1500  BP: 105/65 135/65  Pulse: (!) 57 63  Resp: 12 16  Temp:  36.5 C  SpO2: 97% 94%    Last Pain:  Vitals:   04/26/19 1500  TempSrc:   PainSc: 0-No pain                 Lynda Rainwater

## 2019-04-26 NOTE — Op Note (Signed)
I assisted Surgeon(s) and Role:    * Leanora Cover, MD - Primary    Daryll Brod, MD on the Procedure(s): OPEN REDUCTION INTERNAL FIXATION (ORIF) RIGHT RING AND RIGHT SMALL METACARPAL FRACTURE on 04/26/2019.  I provided assistance on this case as follows: Set up, approach, debridement of the fractures, reduction of the fractures, stabilization of the fractures, fixation of the fracture of the ring finger with screw fixation and fixation of the small finger with plate fixation, closure of the wound, application dressings and splints.   Electronically signed by: Daryll Brod, MD Date: 04/26/2019 Time: 2:08 PM

## 2019-04-26 NOTE — Discharge Instructions (Addendum)

## 2019-04-26 NOTE — Anesthesia Preprocedure Evaluation (Signed)
Anesthesia Evaluation  Patient identified by MRN, date of birth, ID band Patient awake    Reviewed: Allergy & Precautions, H&P , NPO status , Patient's Chart, lab work & pertinent test results  Airway Mallampati: III  TM Distance: >3 FB Neck ROM: Full    Dental no notable dental hx. (+) Poor Dentition, Dental Advisory Given   Pulmonary asthma , Current Smoker,    Pulmonary exam normal breath sounds clear to auscultation       Cardiovascular hypertension, Pt. on medications negative cardio ROS   Rhythm:Regular Rate:Normal     Neuro/Psych  Headaches, Anxiety Depression Bipolar Disorder negative psych ROS   GI/Hepatic Neg liver ROS, GERD  Medicated and Controlled,  Endo/Other  diabetesMorbid obesity  Renal/GU negative Renal ROS  negative genitourinary   Musculoskeletal  (+) Arthritis , Osteoarthritis,    Abdominal (+) + obese,   Peds  Hematology negative hematology ROS (+)   Anesthesia Other Findings   Reproductive/Obstetrics negative OB ROS                             Anesthesia Physical  Anesthesia Plan  ASA: III  Anesthesia Plan: MAC and Regional   Post-op Pain Management:  Regional for Post-op pain   Induction: Intravenous  PONV Risk Score and Plan: 1 and Ondansetron  Airway Management Planned: Simple Face Mask  Additional Equipment:   Intra-op Plan:   Post-operative Plan:   Informed Consent: I have reviewed the patients History and Physical, chart, labs and discussed the procedure including the risks, benefits and alternatives for the proposed anesthesia with the patient or authorized representative who has indicated his/her understanding and acceptance.     Dental advisory given  Plan Discussed with: CRNA  Anesthesia Plan Comments:         Anesthesia Quick Evaluation

## 2019-05-01 ENCOUNTER — Encounter: Payer: Self-pay | Admitting: *Deleted

## 2019-05-02 DIAGNOSIS — M79641 Pain in right hand: Secondary | ICD-10-CM | POA: Diagnosis not present

## 2019-05-02 DIAGNOSIS — S62324D Displaced fracture of shaft of fourth metacarpal bone, right hand, subsequent encounter for fracture with routine healing: Secondary | ICD-10-CM | POA: Diagnosis not present

## 2019-05-02 DIAGNOSIS — S62326D Displaced fracture of shaft of fifth metacarpal bone, right hand, subsequent encounter for fracture with routine healing: Secondary | ICD-10-CM | POA: Diagnosis not present

## 2019-05-02 DIAGNOSIS — M25641 Stiffness of right hand, not elsewhere classified: Secondary | ICD-10-CM | POA: Diagnosis not present

## 2019-05-09 DIAGNOSIS — M79641 Pain in right hand: Secondary | ICD-10-CM | POA: Diagnosis not present

## 2019-05-09 DIAGNOSIS — S62324D Displaced fracture of shaft of fourth metacarpal bone, right hand, subsequent encounter for fracture with routine healing: Secondary | ICD-10-CM | POA: Diagnosis not present

## 2019-05-09 DIAGNOSIS — S62326D Displaced fracture of shaft of fifth metacarpal bone, right hand, subsequent encounter for fracture with routine healing: Secondary | ICD-10-CM | POA: Diagnosis not present

## 2019-05-09 DIAGNOSIS — M25641 Stiffness of right hand, not elsewhere classified: Secondary | ICD-10-CM | POA: Diagnosis not present

## 2019-05-30 DIAGNOSIS — S62324D Displaced fracture of shaft of fourth metacarpal bone, right hand, subsequent encounter for fracture with routine healing: Secondary | ICD-10-CM | POA: Diagnosis not present

## 2019-05-30 DIAGNOSIS — S62326D Displaced fracture of shaft of fifth metacarpal bone, right hand, subsequent encounter for fracture with routine healing: Secondary | ICD-10-CM | POA: Diagnosis not present

## 2019-06-06 DIAGNOSIS — K219 Gastro-esophageal reflux disease without esophagitis: Secondary | ICD-10-CM | POA: Diagnosis not present

## 2019-06-06 DIAGNOSIS — L409 Psoriasis, unspecified: Secondary | ICD-10-CM | POA: Diagnosis not present

## 2019-06-06 DIAGNOSIS — E669 Obesity, unspecified: Secondary | ICD-10-CM | POA: Diagnosis not present

## 2019-06-06 DIAGNOSIS — F418 Other specified anxiety disorders: Secondary | ICD-10-CM | POA: Diagnosis not present

## 2019-06-06 DIAGNOSIS — G894 Chronic pain syndrome: Secondary | ICD-10-CM | POA: Diagnosis not present

## 2019-06-06 DIAGNOSIS — J4520 Mild intermittent asthma, uncomplicated: Secondary | ICD-10-CM | POA: Diagnosis not present

## 2019-06-06 DIAGNOSIS — E1165 Type 2 diabetes mellitus with hyperglycemia: Secondary | ICD-10-CM | POA: Diagnosis not present

## 2019-06-06 DIAGNOSIS — Z720 Tobacco use: Secondary | ICD-10-CM | POA: Diagnosis not present

## 2019-06-06 DIAGNOSIS — E785 Hyperlipidemia, unspecified: Secondary | ICD-10-CM | POA: Diagnosis not present

## 2019-06-06 DIAGNOSIS — G43111 Migraine with aura, intractable, with status migrainosus: Secondary | ICD-10-CM | POA: Diagnosis not present

## 2019-06-06 DIAGNOSIS — I10 Essential (primary) hypertension: Secondary | ICD-10-CM | POA: Diagnosis not present

## 2019-06-06 DIAGNOSIS — K2970 Gastritis, unspecified, without bleeding: Secondary | ICD-10-CM | POA: Diagnosis not present

## 2019-06-20 DIAGNOSIS — Z23 Encounter for immunization: Secondary | ICD-10-CM | POA: Diagnosis not present

## 2019-06-20 DIAGNOSIS — S62326D Displaced fracture of shaft of fifth metacarpal bone, right hand, subsequent encounter for fracture with routine healing: Secondary | ICD-10-CM | POA: Diagnosis not present

## 2019-06-20 DIAGNOSIS — S62324D Displaced fracture of shaft of fourth metacarpal bone, right hand, subsequent encounter for fracture with routine healing: Secondary | ICD-10-CM | POA: Diagnosis not present

## 2019-06-22 DIAGNOSIS — M545 Low back pain: Secondary | ICD-10-CM | POA: Diagnosis not present

## 2019-06-22 DIAGNOSIS — Z79899 Other long term (current) drug therapy: Secondary | ICD-10-CM | POA: Diagnosis not present

## 2019-07-06 DIAGNOSIS — Z79899 Other long term (current) drug therapy: Secondary | ICD-10-CM | POA: Diagnosis not present

## 2019-07-25 DIAGNOSIS — Z981 Arthrodesis status: Secondary | ICD-10-CM | POA: Diagnosis not present

## 2019-07-25 DIAGNOSIS — S62324D Displaced fracture of shaft of fourth metacarpal bone, right hand, subsequent encounter for fracture with routine healing: Secondary | ICD-10-CM | POA: Diagnosis not present

## 2019-07-25 DIAGNOSIS — S62326D Displaced fracture of shaft of fifth metacarpal bone, right hand, subsequent encounter for fracture with routine healing: Secondary | ICD-10-CM | POA: Diagnosis not present

## 2019-08-03 DIAGNOSIS — M129 Arthropathy, unspecified: Secondary | ICD-10-CM | POA: Diagnosis not present

## 2019-08-03 DIAGNOSIS — Z1152 Encounter for screening for COVID-19: Secondary | ICD-10-CM | POA: Diagnosis not present

## 2019-08-03 DIAGNOSIS — E559 Vitamin D deficiency, unspecified: Secondary | ICD-10-CM | POA: Diagnosis not present

## 2019-08-03 DIAGNOSIS — Z79899 Other long term (current) drug therapy: Secondary | ICD-10-CM | POA: Diagnosis not present

## 2019-08-22 DIAGNOSIS — S62326K Displaced fracture of shaft of fifth metacarpal bone, right hand, subsequent encounter for fracture with nonunion: Secondary | ICD-10-CM | POA: Diagnosis not present

## 2019-08-22 DIAGNOSIS — S62324D Displaced fracture of shaft of fourth metacarpal bone, right hand, subsequent encounter for fracture with routine healing: Secondary | ICD-10-CM | POA: Diagnosis not present

## 2019-08-28 DIAGNOSIS — R300 Dysuria: Secondary | ICD-10-CM | POA: Diagnosis not present

## 2019-08-28 DIAGNOSIS — F418 Other specified anxiety disorders: Secondary | ICD-10-CM | POA: Diagnosis not present

## 2019-08-28 DIAGNOSIS — E669 Obesity, unspecified: Secondary | ICD-10-CM | POA: Diagnosis not present

## 2019-08-28 DIAGNOSIS — I10 Essential (primary) hypertension: Secondary | ICD-10-CM | POA: Diagnosis not present

## 2019-08-28 DIAGNOSIS — Z720 Tobacco use: Secondary | ICD-10-CM | POA: Diagnosis not present

## 2019-08-28 DIAGNOSIS — J4520 Mild intermittent asthma, uncomplicated: Secondary | ICD-10-CM | POA: Diagnosis not present

## 2019-08-28 DIAGNOSIS — K219 Gastro-esophageal reflux disease without esophagitis: Secondary | ICD-10-CM | POA: Diagnosis not present

## 2019-08-28 DIAGNOSIS — E1165 Type 2 diabetes mellitus with hyperglycemia: Secondary | ICD-10-CM | POA: Diagnosis not present

## 2019-08-28 DIAGNOSIS — L409 Psoriasis, unspecified: Secondary | ICD-10-CM | POA: Diagnosis not present

## 2019-08-28 DIAGNOSIS — G894 Chronic pain syndrome: Secondary | ICD-10-CM | POA: Diagnosis not present

## 2019-08-28 DIAGNOSIS — G43111 Migraine with aura, intractable, with status migrainosus: Secondary | ICD-10-CM | POA: Diagnosis not present

## 2019-08-28 DIAGNOSIS — E785 Hyperlipidemia, unspecified: Secondary | ICD-10-CM | POA: Diagnosis not present

## 2019-08-29 ENCOUNTER — Other Ambulatory Visit: Payer: Self-pay | Admitting: Diagnostic Neuroimaging

## 2019-08-29 DIAGNOSIS — S62326K Displaced fracture of shaft of fifth metacarpal bone, right hand, subsequent encounter for fracture with nonunion: Secondary | ICD-10-CM | POA: Diagnosis not present

## 2019-09-03 DIAGNOSIS — K219 Gastro-esophageal reflux disease without esophagitis: Secondary | ICD-10-CM | POA: Diagnosis not present

## 2019-09-03 DIAGNOSIS — Z79899 Other long term (current) drug therapy: Secondary | ICD-10-CM | POA: Diagnosis not present

## 2019-09-03 DIAGNOSIS — G894 Chronic pain syndrome: Secondary | ICD-10-CM | POA: Diagnosis not present

## 2019-09-03 DIAGNOSIS — E559 Vitamin D deficiency, unspecified: Secondary | ICD-10-CM | POA: Diagnosis not present

## 2019-09-11 ENCOUNTER — Telehealth: Payer: Self-pay | Admitting: Diagnostic Neuroimaging

## 2019-09-11 MED ORDER — AIMOVIG 70 MG/ML ~~LOC~~ SOAJ: 70.0000 mg | SUBCUTANEOUS | 0 refills | Status: DC

## 2019-09-11 NOTE — Telephone Encounter (Signed)
Pt is needing a refill on her Erenumab-aooe (AIMOVIG) 71 MG/ML SOAJ sent in to the [facility] on [address]  Pt has scheduled appt.

## 2019-09-11 NOTE — Telephone Encounter (Signed)
Refill sent to the pharmacy for the patient. She must keep upcoming apt for future refills.

## 2019-09-21 DIAGNOSIS — L409 Psoriasis, unspecified: Secondary | ICD-10-CM | POA: Diagnosis not present

## 2019-09-25 ENCOUNTER — Encounter: Payer: Self-pay | Admitting: *Deleted

## 2019-09-25 NOTE — Telephone Encounter (Signed)
[place] [facility] Aimovig PA form on MD's desk for review, signature.

## 2019-09-25 NOTE — Telephone Encounter (Signed)
Aimovig 70 mg/mL PA form faxed to [employer]. 24 hour decision. Sent my chart to advise patient.

## 2019-09-25 NOTE — Telephone Encounter (Signed)
Pt has called to report she has ben informed that a prior Josem Kaufmann is needed on her Erenumab-aooe (AIMOVIG) 22 MG/ML SOAJ .  Pt was told to ask a rush be placed on this PA due to her starting to get headaches

## 2019-09-26 NOTE — Telephone Encounter (Signed)
Unable to check status of Aimovg PA, sent her my chart and advised she have pharmacy run Rx to see if insurance approved.

## 2019-10-01 ENCOUNTER — Other Ambulatory Visit: Payer: Self-pay | Admitting: Diagnostic Neuroimaging

## 2019-10-01 ENCOUNTER — Telehealth: Payer: Self-pay | Admitting: Diagnostic Neuroimaging

## 2019-10-01 ENCOUNTER — Encounter: Payer: Self-pay | Admitting: *Deleted

## 2019-10-01 NOTE — Telephone Encounter (Addendum)
Called [place] [facility] to check status of Aimovig PA. Spoke with Burman Nieves denied due to her drugs are managed by [facility] managed care plan, p [phone]. Called #, no longer in service. Called patient on only #, message" the person is not available at the moment, please call back later".

## 2019-10-01 NOTE — Telephone Encounter (Signed)
See note dated 09/11/19.

## 2019-10-01 NOTE — Telephone Encounter (Addendum)
Received: [employer] ([place]) called, call [employer] PA for [employer] (AIMOVIG) 70 MG/ML SOAJ to [phone]. Called, spoke with pharmacy specialist to start PA for [place]. Clinical questions answered. Will go for review with PA decision in 24 hours received via fax.

## 2019-10-01 NOTE — Telephone Encounter (Signed)
Received fax from [facility]: Aimovig approved through 12/31/2019. Faex approval letter to [facility], sent her my chart to advise.

## 2019-10-01 NOTE — Telephone Encounter (Signed)
[employer] ([place]) called, call [employer] PA for [employer] (AIMOVIG) 70 MG/ML SOAJ to [phone]

## 2019-10-03 DIAGNOSIS — Z79899 Other long term (current) drug therapy: Secondary | ICD-10-CM | POA: Diagnosis not present

## 2019-10-15 ENCOUNTER — Ambulatory Visit: Payer: Medicaid Other | Admitting: Diagnostic Neuroimaging

## 2019-10-17 DIAGNOSIS — Z79899 Other long term (current) drug therapy: Secondary | ICD-10-CM | POA: Diagnosis not present

## 2019-10-24 ENCOUNTER — Other Ambulatory Visit: Payer: Self-pay | Admitting: Diagnostic Neuroimaging

## 2019-11-12 ENCOUNTER — Encounter: Payer: Self-pay | Admitting: Diagnostic Neuroimaging

## 2019-11-12 ENCOUNTER — Other Ambulatory Visit: Payer: Self-pay

## 2019-11-12 ENCOUNTER — Ambulatory Visit: Payer: Medicaid Other | Admitting: Diagnostic Neuroimaging

## 2019-11-12 VITALS — BP 149/92 | HR 59 | Wt 256.4 lb

## 2019-11-12 DIAGNOSIS — G43109 Migraine with aura, not intractable, without status migrainosus: Secondary | ICD-10-CM | POA: Diagnosis not present

## 2019-11-12 NOTE — Patient Instructions (Signed)
MIGRAINE PREVENTION  --> continue aimovig (improving on medication)  MIGRAINE RESCUE  --> rizatriptan 10mg  as needed for breakthrough headache; may repeat x 1 after 2 hours; max 2 tabs per day or 8 per month

## 2019-11-12 NOTE — Progress Notes (Signed)
[facility]  PATIENT: Michelle [name] DOB: 1965-04-16  REFERRING CLINICIAN: Trey Sailors, PA HISTORY FROM: patient REASON FOR VISIT: follow up   HISTORICAL  CHIEF COMPLAINT:  Chief Complaint  Patient presents with  . Migraine    rm 6, FU to discuss medications "might have 1-2 migraines a month; something is going on in my ears- saw PCP and ear specialist"    HISTORY OF PRESENT ILLNESS:   UPDATE (11/12/19, VRP): Since last visit, doing well. HA now 2-3 per month. Symptoms are improved. Severity is mild. No alleviating or aggravating factors. Tolerating aimovig and rizatriptan.    PRIOR HPI: 54 year old female here for evaluation of headaches.  Patient has had a since childhood with severe head and scalp pain.  At that time she was told that headaches were related to excessively long hair.  Patient had some nausea with her headaches.  Over time she outgrew these headaches.  Patient also has family history of migraine headaches in her mother.  2008 patient had recurrence of headaches.  This time with frontal and right-sided pain and pressure with throbbing sensation, nausea, sensitivity to light and sound.  Patient has 2-3 severe headaches per month lasting days at a time.  She averages 8 to 12 days of headache per month.  Patient has been diagnosed with migraine headache.  She tried topiramate but this caused nausea.  She tried Imitrex without relief.  She tried over-the-counter medicines including Tylenol ibuprofen without relief.  She is also on Friday medications for mood and pain control including duloxetine, Celexa, oxycodone, Robaxin.   REVIEW OF SYSTEMS: Full 14 system review of systems performed and negative with exception of: as per HPI.   ALLERGIES: Allergies  Allergen Reactions  . Penicillins Itching, Nausea And Vomiting and Rash    Has patient had a PCN reaction causing immediate rash, facial/tongue/throat swelling, SOB or lightheadedness  with hypotension: #  #  #  YES  #  #  #  Has patient had a PCN reaction causing severe rash involving mucus membranes or skin necrosis: No Has patient had a PCN reaction that required hospitalization No Has patient had a PCN reaction occurring within the last 10 years: No If all of the above answers are "NO", then may proceed with Cephalosporin use.   . Tylenol [Acetaminophen] Other (See Comments)    Dr advised not to take due to kidney failure in 12  . Imitrex [Sumatriptan] Itching    sleepiness  . Codeine Itching    HOME MEDICATIONS: Outpatient Medications Prior to Visit  Medication Sig Dispense Refill  . Adalimumab (HUMIRA) 40 MG/0.4ML PSKT Inject into the skin.    Marland Kitchen AIMOVIG 70 MG/ML SOAJ INJECT THE CONTENTS OF ONE PEN INTO THE SKIN EVERY 30 DAYS`` 1 mL 0  . cholecalciferol (VITAMIN D) 1000 units tablet Take 3,000 Units by mouth daily.    . citalopram (CELEXA) 20 MG tablet Take 20 mg by mouth daily.    Marland Kitchen gabapentin (NEURONTIN) 300 MG capsule Take 300-600 mg by mouth 3 (three) times daily.    . hydrochlorothiazide (HYDRODIURIL) 25 MG tablet Take 25 mg by mouth daily.    . Ipratropium-Albuterol (COMBIVENT RESPIMAT) 20-100 MCG/ACT AERS respimat Inhale 2 puffs into the lungs 3 (three) times daily as needed for wheezing.     . methocarbamol (ROBAXIN) 750 MG tablet Take 1 tablet (750 mg total) by mouth every 8 (eight) hours as needed for muscle spasms. 50 tablet 0  . omeprazole (PRILOSEC)  20 MG capsule Take 20 mg by mouth 2 (two) times daily before a meal.    . rizatriptan (MAXALT-MLT) 10 MG disintegrating tablet Take 1 tablet (10 mg total) by mouth as needed for migraine. May repeat in 2 hours if needed 9 tablet 11  . traZODone (DESYREL) 100 MG tablet Take 100 mg by mouth at bedtime.    . vitamin B-12 (CYANOCOBALAMIN) 500 MCG tablet Take by mouth.    . Vitamin D, Ergocalciferol, (DRISDOL) 1.25 MG (50000 UNIT) CAPS capsule Take 50,000 Units by mouth once a week.    . furosemide (LASIX) 40  MG tablet Take 40 mg by mouth. (Patient not taking: Reported on 11/12/2019)    . oxyCODONE (ROXICODONE) 5 MG immediate release tablet Take 20 mg by mouth as needed for severe pain. 5 x day as needed (Patient not taking: Reported on 11/12/2019)    . oxyCODONE (ROXICODONE) 5 MG immediate release tablet 1-2 tabs PO q6 hours prn pain (Patient not taking: Reported on 11/12/2019) 20 tablet 0  . oxyCODONE (XTAMPZA ER PO) Take by mouth. (Patient not taking: Reported on 11/12/2019)     No facility-administered medications prior to visit.    PAST MEDICAL HISTORY: Past Medical History:  Diagnosis Date  . Anxiety    panic attacts  . Arthritis    hips, knees, hands and spine  . Asthma    daily inhaler  . Barrett's esophagus   . Bipolar disorder ([place])   . Depression   . GERD (gastroesophageal reflux disease)   . History of acute renal failure 2012   resolved  . History of gastric ulcer   . History of MRSA infection 2014   back  . Hypertension   . IBS (irritable bowel syndrome)   . 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 Migraine equivalent    hx  . Obesity   . Pre-diabetes   . Psoriasis    elbows, trunk    PAST SURGICAL HISTORY: Past Surgical History:  Procedure Laterality Date  . ANTERIOR LUMBAR FUSION  12/05/2006   L5-S1  . CARPAL TUNNEL RELEASE Right 08/15/2009  . CARPAL TUNNEL RELEASE  11/23/2010   Procedure: CARPAL TUNNEL RELEASE;  Surgeon: Meredith Pel;  Location: [facility];  Service: Orthopedics;  Laterality: Left;  revision left carpal tunnel release neurolysis of median nerve  . CARPAL TUNNEL RELEASE Left 12/02/2009  . CARPECTOMY  10/05/2011   Procedure: CARPECTOMY;  Surgeon: Tennis Must, MD;  Location: [facility];  Service: Orthopedics;  Laterality: Left;  left proximal row carpectomy bone grafting capitate and radial styloidectomy  . CERVICAL FUSION  1994; 2000  . CHOLECYSTECTOMY  1991  . COLONOSCOPY W/ POLYPECTOMY  06/14/2011   with Propofol  .  ESOPHAGOGASTRODUODENOSCOPY  03/20/2011   Procedure: ESOPHAGOGASTRODUODENOSCOPY (EGD);  Surgeon: Irene Shipper, MD;  Location[facility] END[facility]COPY;  Service: Endoscopy;  Laterality: N/A;  Clarise Cruz /ja  . ESOPHAGOGASTRODUODENOSCOPY (EGD) WITH ESOPHAGEAL DILATION  05/10/2012   with Propofol  . KNEE ARTHROSCOPY Bilateral   . MULTIPLE TOOTH EXTRACTIONS    . OPEN REDUCTION INTERNAL FIXATION (ORIF) METACARPAL Right 04/26/2019   Procedure: OPEN REDUCTION INTERNAL FIXATION (ORIF) RIGHT RING AND RIGHT SMALL METACARPAL FRACTURE;  Surgeon: Leanora Cover, MD;  Location: [facility];  Service: Orthopedics;  Laterality: Right;  . TOTAL HIP ARTHROPLASTY Right 06/01/2013   Procedure: POSTERIOR RIGHT TOTAL HIP ARTHROPLASTY;  Surgeon: Alta Corning, MD;  Location: [place];  Service: Orthopedics;  Laterality: Right;  .  TOTAL KNEE ARTHROPLASTY Left 11/28/2007  . TOTAL KNEE ARTHROPLASTY Right 02/16/2016   Procedure: TOTAL KNEE ARTHROPLASTY;  Surgeon: Dorna Leitz, MD;  Location: [facility];  Service: Orthopedics;  Laterality: Right;  . TUBAL LIGATION  1991  . WRIST FUSION Left 08/29/2014   Procedure: LEFT WRIST ARTHRODESIS WRIST;  Surgeon: Leanora Cover, MD;  Location: [facility];  Service: Orthopedics;  Laterality: Left;    FAMILY HISTORY: Family History  Problem Relation Age of Onset  . Esophageal cancer Father   . Heart failure Mother   . Diabetes Mother   . Hypertension Mother   . High Cholesterol Mother   . Clotting disorder Daughter     SOCIAL HISTORY: Social History   Socioeconomic History  . Marital status: Single    Spouse name: Not on file  . Number of children: 2  . Years of education: 68  . Highest education level: Not on file  Occupational History  . Occupation: disabled  Tobacco Use  . Smoking status: Current Every Day Smoker    Packs/day: 0.50    Years: 33.00    Pack years: 16.50    Types: Cigarettes  . Smokeless tobacco: Never Used  Substance and Sexual Activity  . Alcohol  use: Yes    Comment: occasionally  . Drug use: No  . Sexual activity: Not Currently    Birth control/protection: Surgical  Other Topics Concern  . Not on file  Social History Narrative   Lives with daughter   Caffeine- coffee 1-2 daily, sodas 5-6 daily   Social Determinants of Health   Financial Resource Strain:   . Difficulty of Paying Living Expenses: Not on file  Food Insecurity:   . Worried About Charity fundraiser in the Last Year: Not on file  . Ran Out of Food in the Last Year: Not on file  Transportation Needs:   . Lack of Transportation (Medical): Not on file  . Lack of Transportation (Non-Medical): Not on file  Physical Activity:   . Days of Exercise per Week: Not on file  . Minutes of Exercise per Session: Not on file  Stress:   . Feeling of Stress : Not on file  Social Connections:   . Frequency of Communication with Friends and Family: Not on file  . Frequency of Social Gatherings with Friends and Family: Not on file  . Attends Religious Services: Not on file  . Active Member of Clubs or Organizations: Not on file  . Attends Archivist Meetings: Not on file  . Marital Status: Not on file  Intimate Partner Violence:   . Fear of Current or Ex-Partner: Not on file  . Emotionally Abused: Not on file  . Physically Abused: Not on file  . Sexually Abused: Not on file     PHYSICAL EXAM  GENERAL EXAM/CONSTITUTIONAL: Vitals:  Vitals:   11/12/19 1500  BP: (!) 149/92  Pulse: (!) 59  Weight: 256 lb 6.4 oz (116.3 kg)     Body mass index is 37.86 kg/m. Wt Readings from Last 3 Encounters:  11/12/19 256 lb 6.4 oz (116.3 kg)  04/26/19 272 lb 0.8 oz (123.4 kg)  04/20/19 264 lb (119.7 kg)     Patient is in no distress; well developed, nourished and groomed; neck is supple  CARDIOVASCULAR:  Examination of carotid arteries is normal; no carotid bruits  Regular rate and rhythm, no murmurs  Examination of peripheral vascular system by observation  and palpation is normal  EYES:  Ophthalmoscopic exam of optic discs and posterior segments is normal; no papilledema or hemorrhages  No exam data present  MUSCULOSKELETAL:  Gait, strength, tone, movements noted in Neurologic exam below  NEUROLOGIC: MENTAL STATUS:  No flowsheet data found.  awake, alert, oriented to person, place and time  recent and remote memory intact  normal attention and concentration  language fluent, comprehension intact, naming intact  fund of knowledge appropriate  CRANIAL NERVE:   2nd - no papilledema on fundoscopic exam  2nd, 3rd, 4th, 6th - pupils equal and reactive to light, visual fields full to confrontation, extraocular muscles intact, no nystagmus  5th - facial sensation symmetric  7th - facial strength symmetric  8th - hearing intact  9th - palate elevates symmetrically, uvula midline  11th - shoulder shrug symmetric  12th - tongue protrusion midline  MOTOR:   normal bulk and tone, full strength in the BUE, BLE  SENSORY:   normal and symmetric to light touch, temperature, vibration  COORDINATION:   finger-nose-finger, fine finger movements normal  REFLEXES:   deep tendon reflexes present and symmetric  GAIT/STATION:   narrow based gait     DIAGNOSTIC DATA (LABS, IMAGING, TESTING) - I reviewed patient records, labs, notes, testing and imaging myself where available.  Lab Results  Component Value Date   WBC 9.5 02/18/2016   HGB 12.4 02/18/2016   HCT 38.0 02/18/2016   MCV 91.8 02/18/2016   PLT 116 (L) 02/18/2016      Component Value Date/Time   NA 139 04/24/2019 1344   K 3.6 04/24/2019 1344   CL 97 (L) 04/24/2019 1344   CO2 34 (H) 04/24/2019 1344   GLUCOSE 126 (H) 04/24/2019 1344   BUN 9 04/24/2019 1344   CREATININE 1.05 (H) 04/24/2019 1344   CALCIUM 8.6 (L) 04/24/2019 1344   PROT 6.9 02/11/2016 1344   ALBUMIN 3.2 (L) 02/11/2016 1344   AST 23 02/11/2016 1344   ALT 14 02/11/2016 1344   ALKPHOS 96  02/11/2016 1344   BILITOT 0.3 02/11/2016 1344   GFRNONAA >60 04/24/2019 1344   GFRAA >60 04/24/2019 1344   Lab Results  Component Value Date   CHOL 127 04/26/2008   HDL 46 04/26/2008   LDLCALC 66 04/26/2008   TRIG 73 04/26/2008   CHOLHDL 2.8 Ratio 04/26/2008   Lab Results  Component Value Date   HGBA1C 5.7 (H) 02/11/2016   No results found for: VITAMINB12 No results found for: TSH   05/14/11 CT head [I reviewed images myself and agree with interpretation. -VRP]  - normal   ASSESSMENT AND PLAN  54 y.o. year old female here with migraine with aura.   Meds tried: topiramate, duloxetine, celexa, robaxin, oxycodone, imitrex  Dx:  1. Migraine with aura and without status migrainosus, not intractable      PLAN:  MIGRAINE PREVENTION  --> continue aimovig (improving on medication)  MIGRAINE RESCUE  --> rizatriptan 10mg  as needed for breakthrough headache; may repeat x 1 after 2 hours; max 2 tabs per day or 8 per month  Return in about 1 year (around 11/11/2020) for with NP (Amy Lomax), virtual visit (15 min).    Penni Bombard, MD 58/05/2776, 2:42 PM Certified in Neurology, Neurophysiology and Neuroimaging  [facility] [address], [place], [place] [address] [phone]

## 2019-11-28 ENCOUNTER — Other Ambulatory Visit: Payer: Self-pay | Admitting: Diagnostic Neuroimaging

## 2019-11-28 ENCOUNTER — Encounter: Payer: Self-pay | Admitting: *Deleted

## 2019-12-11 DIAGNOSIS — E1165 Type 2 diabetes mellitus with hyperglycemia: Secondary | ICD-10-CM | POA: Diagnosis not present

## 2019-12-11 DIAGNOSIS — E669 Obesity, unspecified: Secondary | ICD-10-CM | POA: Diagnosis not present

## 2019-12-11 DIAGNOSIS — Z720 Tobacco use: Secondary | ICD-10-CM | POA: Diagnosis not present

## 2019-12-11 DIAGNOSIS — L409 Psoriasis, unspecified: Secondary | ICD-10-CM | POA: Diagnosis not present

## 2019-12-11 DIAGNOSIS — J4520 Mild intermittent asthma, uncomplicated: Secondary | ICD-10-CM | POA: Diagnosis not present

## 2019-12-11 DIAGNOSIS — K219 Gastro-esophageal reflux disease without esophagitis: Secondary | ICD-10-CM | POA: Diagnosis not present

## 2019-12-11 DIAGNOSIS — I10 Essential (primary) hypertension: Secondary | ICD-10-CM | POA: Diagnosis not present

## 2019-12-11 DIAGNOSIS — G43111 Migraine with aura, intractable, with status migrainosus: Secondary | ICD-10-CM | POA: Diagnosis not present

## 2019-12-11 DIAGNOSIS — F418 Other specified anxiety disorders: Secondary | ICD-10-CM | POA: Diagnosis not present

## 2019-12-11 DIAGNOSIS — G894 Chronic pain syndrome: Secondary | ICD-10-CM | POA: Diagnosis not present

## 2019-12-11 DIAGNOSIS — E785 Hyperlipidemia, unspecified: Secondary | ICD-10-CM | POA: Diagnosis not present

## 2019-12-11 DIAGNOSIS — H9203 Otalgia, bilateral: Secondary | ICD-10-CM | POA: Diagnosis not present

## 2020-01-04 ENCOUNTER — Other Ambulatory Visit: Payer: Self-pay | Admitting: Diagnostic Neuroimaging

## 2020-01-07 ENCOUNTER — Telehealth: Payer: Self-pay | Admitting: *Deleted

## 2020-01-07 ENCOUNTER — Encounter: Payer: Self-pay | Admitting: *Deleted

## 2020-01-07 NOTE — Telephone Encounter (Signed)
Aimovig PA, key:  [id number], G43.109. Failed: topamax, imitrex, tylenol, ibuprofen, celexa, duloxetine, robaxin, oxycodeine. Your information has been sent to [employer]

## 2020-01-07 NOTE — Telephone Encounter (Addendum)
Aimovig_ Request Reference Number: [id number]. AIMOVIG INJ 70MG /ML is approved through 01/06/2021. For further questions, call 01/08/2021 at [phone]. Sent her my chart to advise and advised she call and schedule follow up.

## 2020-01-16 ENCOUNTER — Other Ambulatory Visit: Payer: Self-pay | Admitting: Diagnostic Neuroimaging

## 2020-01-26 ENCOUNTER — Other Ambulatory Visit: Payer: Self-pay | Admitting: Diagnostic Neuroimaging

## 2020-02-04 ENCOUNTER — Telehealth: Payer: Self-pay | Admitting: Diagnostic Neuroimaging

## 2020-02-04 MED ORDER — RIZATRIPTAN BENZOATE 10 MG PO TBDP: 10.0000 mg | ORAL_TABLET | ORAL | 0 refills | Status: DC | PRN

## 2020-02-04 NOTE — Telephone Encounter (Signed)
Pt request refill rizatriptan (MAXALT-MLT) 10 MG disintegrating tablet at [place] [facility] #[id number]. Pt scheduled an appt 02/07/20

## 2020-02-04 NOTE — Telephone Encounter (Signed)
Rizatriptan refilled x 1 month.

## 2020-02-04 NOTE — Telephone Encounter (Signed)
..   Pt understands that although there may be some limitations with this type of visit, we will take all precautions to reduce any security or privacy concerns.  Pt understands that this will be treated like an in office visit and we will file with pt's insurance, and there may be a patient responsible charge related to this service. ? ?

## 2020-02-06 ENCOUNTER — Encounter: Payer: Self-pay | Admitting: *Deleted

## 2020-02-06 ENCOUNTER — Telehealth: Payer: Self-pay | Admitting: Family Medicine

## 2020-02-06 NOTE — Telephone Encounter (Signed)
Sent my chart and patient replied, stated she is doing well. I informed her Rizatriptan was refilled to [facility] 02/04/20, and I would cancel 02/07/20 VV. Advised she schedule a yearly FU for Nov 2022.  02/07/20 VV canceled. She replied understanding.

## 2020-02-06 NOTE — Telephone Encounter (Signed)
Is appt 02/07/2020 needed? May reschedule if doing well to recommended 1 year follow up due 11/2020. TY.

## 2020-02-07 ENCOUNTER — Telehealth: Payer: Medicaid Other | Admitting: Family Medicine

## 2020-02-08 DIAGNOSIS — J4520 Mild intermittent asthma, uncomplicated: Secondary | ICD-10-CM | POA: Diagnosis not present

## 2020-02-08 DIAGNOSIS — E785 Hyperlipidemia, unspecified: Secondary | ICD-10-CM | POA: Diagnosis not present

## 2020-02-08 DIAGNOSIS — F418 Other specified anxiety disorders: Secondary | ICD-10-CM | POA: Diagnosis not present

## 2020-02-08 DIAGNOSIS — E1165 Type 2 diabetes mellitus with hyperglycemia: Secondary | ICD-10-CM | POA: Diagnosis not present

## 2020-02-08 DIAGNOSIS — L409 Psoriasis, unspecified: Secondary | ICD-10-CM | POA: Diagnosis not present

## 2020-02-08 DIAGNOSIS — Z720 Tobacco use: Secondary | ICD-10-CM | POA: Diagnosis not present

## 2020-02-08 DIAGNOSIS — I10 Essential (primary) hypertension: Secondary | ICD-10-CM | POA: Diagnosis not present

## 2020-02-08 DIAGNOSIS — G43111 Migraine with aura, intractable, with status migrainosus: Secondary | ICD-10-CM | POA: Diagnosis not present

## 2020-02-08 DIAGNOSIS — E669 Obesity, unspecified: Secondary | ICD-10-CM | POA: Diagnosis not present

## 2020-02-08 DIAGNOSIS — K219 Gastro-esophageal reflux disease without esophagitis: Secondary | ICD-10-CM | POA: Diagnosis not present

## 2020-03-03 ENCOUNTER — Emergency Department (HOSPITAL_COMMUNITY)
Admission: EM | Admit: 2020-03-03 | Discharge: 2020-03-03 | Disposition: A | Discharge status: Home / Self Care | Payer: Medicaid Other | Attending: Emergency Medicine | Admitting: Emergency Medicine

## 2020-03-03 ENCOUNTER — Other Ambulatory Visit: Payer: Self-pay

## 2020-03-03 ENCOUNTER — Encounter (HOSPITAL_COMMUNITY): Payer: Self-pay | Admitting: *Deleted

## 2020-03-03 ENCOUNTER — Emergency Department (HOSPITAL_COMMUNITY): Payer: Medicaid Other

## 2020-03-03 DIAGNOSIS — S3991XA Unspecified injury of abdomen, initial encounter: Secondary | ICD-10-CM | POA: Diagnosis not present

## 2020-03-03 DIAGNOSIS — Y9389 Activity, other specified: Secondary | ICD-10-CM | POA: Insufficient documentation

## 2020-03-03 DIAGNOSIS — Y9289 Other specified places as the place of occurrence of the external cause: Secondary | ICD-10-CM | POA: Diagnosis not present

## 2020-03-03 DIAGNOSIS — W19XXXA Unspecified fall, initial encounter: Secondary | ICD-10-CM | POA: Diagnosis not present

## 2020-03-03 DIAGNOSIS — F17210 Nicotine dependence, cigarettes, uncomplicated: Secondary | ICD-10-CM | POA: Insufficient documentation

## 2020-03-03 DIAGNOSIS — I10 Essential (primary) hypertension: Secondary | ICD-10-CM | POA: Diagnosis not present

## 2020-03-03 DIAGNOSIS — Z7984 Long term (current) use of oral hypoglycemic drugs: Secondary | ICD-10-CM | POA: Insufficient documentation

## 2020-03-03 DIAGNOSIS — S299XXA Unspecified injury of thorax, initial encounter: Secondary | ICD-10-CM | POA: Diagnosis present

## 2020-03-03 DIAGNOSIS — Z043 Encounter for examination and observation following other accident: Secondary | ICD-10-CM | POA: Diagnosis not present

## 2020-03-03 DIAGNOSIS — S2242XA Multiple fractures of ribs, left side, initial encounter for closed fracture: Secondary | ICD-10-CM | POA: Diagnosis not present

## 2020-03-03 DIAGNOSIS — W010XXA Fall on same level from slipping, tripping and stumbling without subsequent striking against object, initial encounter: Secondary | ICD-10-CM | POA: Diagnosis not present

## 2020-03-03 DIAGNOSIS — M545 Low back pain, unspecified: Secondary | ICD-10-CM | POA: Insufficient documentation

## 2020-03-03 DIAGNOSIS — Z79899 Other long term (current) drug therapy: Secondary | ICD-10-CM | POA: Insufficient documentation

## 2020-03-03 DIAGNOSIS — Y999 Unspecified external cause status: Secondary | ICD-10-CM | POA: Insufficient documentation

## 2020-03-03 DIAGNOSIS — J45909 Unspecified asthma, uncomplicated: Secondary | ICD-10-CM | POA: Diagnosis not present

## 2020-03-03 DIAGNOSIS — M549 Dorsalgia, unspecified: Secondary | ICD-10-CM | POA: Diagnosis not present

## 2020-03-03 DIAGNOSIS — E119 Type 2 diabetes mellitus without complications: Secondary | ICD-10-CM | POA: Diagnosis not present

## 2020-03-03 DIAGNOSIS — R0902 Hypoxemia: Secondary | ICD-10-CM | POA: Diagnosis not present

## 2020-03-03 LAB — CBC WITH DIFFERENTIAL/PLATELET
Abs Immature Granulocytes: 0.01 10*3/uL (ref 0.00–0.07)
Basophils Absolute: 0 10*3/uL (ref 0.0–0.1)
Basophils Relative: 0 %
Eosinophils Absolute: 0.1 10*3/uL (ref 0.0–0.5)
Eosinophils Relative: 1 %
HCT: 46.6 % — ABNORMAL HIGH (ref 36.0–46.0)
Hemoglobin: 15.4 g/dL — ABNORMAL HIGH (ref 12.0–15.0)
Immature Granulocytes: 0 %
Lymphocytes Relative: 33 %
Lymphs Abs: 2.1 10*3/uL (ref 0.7–4.0)
MCH: 32.4 pg (ref 26.0–34.0)
MCHC: 33 g/dL (ref 30.0–36.0)
MCV: 98.1 fL (ref 80.0–100.0)
Monocytes Absolute: 0.5 10*3/uL (ref 0.1–1.0)
Monocytes Relative: 7 %
Neutro Abs: 3.7 10*3/uL (ref 1.7–7.7)
Neutrophils Relative %: 59 %
Platelets: 132 10*3/uL — ABNORMAL LOW (ref 150–400)
RBC: 4.75 MIL/uL (ref 3.87–5.11)
RDW: 13.9 % (ref 11.5–15.5)
WBC: 6.4 10*3/uL (ref 4.0–10.5)
nRBC: 0 % (ref 0.0–0.2)

## 2020-03-03 LAB — COMPREHENSIVE METABOLIC PANEL
ALT: 17 U/L (ref 0–44)
AST: 19 U/L (ref 15–41)
Albumin: 3.7 g/dL (ref 3.5–5.0)
Alkaline Phosphatase: 91 U/L (ref 38–126)
Anion gap: 9 (ref 5–15)
BUN: 16 mg/dL (ref 6–20)
CO2: 29 mmol/L (ref 22–32)
Calcium: 8.9 mg/dL (ref 8.9–10.3)
Chloride: 97 mmol/L — ABNORMAL LOW (ref 98–111)
Creatinine, Ser: 1 mg/dL (ref 0.44–1.00)
GFR, Estimated: >60 mL/min (ref >60)
Glucose, Bld: 120 mg/dL — ABNORMAL HIGH (ref 70–99)
Potassium: 3.3 mmol/L — ABNORMAL LOW (ref 3.5–5.1)
Sodium: 135 mmol/L (ref 135–145)
Total Bilirubin: 0.7 mg/dL (ref 0.3–1.2)
Total Protein: 8.2 g/dL — ABNORMAL HIGH (ref 6.5–8.1)

## 2020-03-03 MED ORDER — ONDANSETRON HCL 4 MG/2ML IJ SOLN
4.0000 mg | Freq: Once | INTRAMUSCULAR | Status: AC
  Administered 2020-03-03: 4 mg via INTRAVENOUS
  Filled 2020-03-03: qty 2

## 2020-03-03 MED ORDER — OXYCODONE HCL 5 MG PO TABS
5.0000 mg | ORAL_TABLET | Freq: Once | ORAL | Status: AC
  Administered 2020-03-03: 5 mg via ORAL
  Filled 2020-03-03: qty 1

## 2020-03-03 MED ORDER — IOHEXOL 300 MG/ML  SOLN
100.0000 mL | Freq: Once | INTRAMUSCULAR | Status: AC | PRN
  Administered 2020-03-03: 100 mL via INTRAVENOUS

## 2020-03-03 MED ORDER — HYDROMORPHONE HCL 1 MG/ML IJ SOLN
1.0000 mg | Freq: Once | INTRAMUSCULAR | Status: AC
  Administered 2020-03-03: 1 mg via INTRAVENOUS
  Filled 2020-03-03: qty 1

## 2020-03-03 MED ORDER — SODIUM CHLORIDE 0.9 % IV BOLUS (SEPSIS)
1000.0000 mL | Freq: Once | INTRAVENOUS | Status: AC
  Administered 2020-03-03: 1000 mL via INTRAVENOUS

## 2020-03-03 MED ORDER — OXYCODONE HCL 5 MG PO TABS: 5.0000 mg | ORAL_TABLET | Freq: Four times a day (QID) | ORAL | 0 refills | Status: DC | PRN

## 2020-03-03 NOTE — ED Triage Notes (Signed)
Pt reports she was giving a lap dance Saturday night and fell when she was trying to twirl because she tripped over her friend's foot. She reports she landed in their lap and then straight down on the floor on her buttocks. Pt c/o pain to left shoulder, left rib cage area and lower back. Pt reports she has tried multiple OTC medications with no relief in pain.

## 2020-03-03 NOTE — ED Notes (Signed)
Pt c/o severe pain in left ribs while moving.  Pt splinting area with a pillow.

## 2020-03-03 NOTE — ED Notes (Signed)
Pt resting, o2 sat 88%.  Placed on o2 at 2liters, sat increased to 91%.  Will continue to monitor.

## 2020-03-03 NOTE — ED Notes (Signed)
Pt called out wanting to speak with provider about xrays and CT scan. Pt states she doesn't feel CT is needed at this time and that she needs something for pain as well as something for pain at home. EDP made aware.

## 2020-03-03 NOTE — Discharge Instructions (Addendum)
Follow-up with your family doctor within a week for recheck return if problems

## 2020-03-03 NOTE — ED Provider Notes (Signed)
[facility] EMERGENCY DEPARTMENT Provider Note   CSN: [id number] Arrival date & time: 03/03/20  1430     History Chief Complaint  Patient presents with  . Fall    Michelle [name] is a 55 y.o. female.  The history is provided by the patient. No language interpreter was used.  Fall This is a new problem. The current episode started 2 days ago. The problem occurs constantly. The problem has been gradually worsening. Nothing aggravates the symptoms. Nothing relieves the symptoms. She has tried nothing for the symptoms.  Pt reports she fell 2 days ago and hit her low back and her ribs.  Pt complains of pain in her left ribs and her low back     Past Medical History:  Diagnosis Date  . Anxiety    panic attacts  . Arthritis    hips, knees, hands and spine  . Asthma    daily inhaler  . Barrett's esophagus   . Bipolar disorder ([place])   . Depression   . GERD (gastroesophageal reflux disease)   . History of acute renal failure 2012   resolved  . History of gastric ulcer   . History of MRSA infection 2014   back  . Hypertension   . IBS (irritable bowel syndrome)   . 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 Migraine equivalent    hx  . Obesity   . Pre-diabetes   . Psoriasis    elbows, trunk    Patient Active Problem List   Diagnosis Date Noted  . Osteoarthritis of right hip 06/01/2013  . Severe obesity (BMI >= 40) ([place]) 06/01/2013  . Narcotic abuse ([place])   . Barrett esophagus 03/20/2011  . Esophageal reflux 03/20/2011  . Gastroparesis 03/20/2011  . Abdominal pain, epigastric 03/19/2011  . Nausea and vomiting 03/18/2011  . Diarrhea 03/18/2011  . ARF (acute renal failure) ([place]) 03/18/2011  . ONYCHOMYCOSIS, BILATERAL 08/22/2009  . PSORIASIS 08/22/2009  . CARPAL TUNNEL SYNDROME, BILATERAL 05/28/2009  . DIABETES MELLITUS, TYPE II, CONTROLLED 04/30/2009  . HYPERGLYCEMIA 04/25/2009  . BRONCHITIS, CHRONIC 04/08/2009  . PERIPHERAL EDEMA 04/08/2009  . TOBACCO ABUSE  02/18/2009  . MENOPAUSAL SYNDROME 02/18/2009  . PRURITUS, EARS 02/18/2009  . OBESITY 07/26/2008  . NECK PAIN 04/26/2008  . ACUTE BRONCHITIS 10/23/2007  . Primary osteoarthritis of right knee 07/20/2007  . INTERTRIGO 04/26/2007  . ANXIETY 01/29/2007  . DEPRESSION 01/29/2007  . CANDIDIASIS, SKIN 01/27/2007  . [place] DISEASE, LUMBOSACRAL SPINE 12/05/2006    Past Surgical History:  Procedure Laterality Date  . ANTERIOR LUMBAR FUSION  12/05/2006   L5-S1  . CARPAL TUNNEL RELEASE Right 08/15/2009  . CARPAL TUNNEL RELEASE  11/23/2010   Procedure: CARPAL TUNNEL RELEASE;  Surgeon: Meredith Pel;  Location: [place];  Service: Orthopedics;  Laterality: Left;  revision left carpal tunnel release neurolysis of median nerve  . CARPAL TUNNEL RELEASE Left 12/02/2009  . CARPECTOMY  10/05/2011   Procedure: CARPECTOMY;  Surgeon: Tennis Must, MD;  Location: [place];  Service: Orthopedics;  Laterality: Left;  left proximal row carpectomy bone grafting capitate and radial styloidectomy  . CERVICAL FUSION  1994; 2000  . CHOLECYSTECTOMY  1991  . COLONOSCOPY W/ POLYPECTOMY  06/14/2011   with Propofol  . ESOPHAGOGASTRODUODENOSCOPY  03/20/2011   Procedure: ESOPHAGOGASTRODUODENOSCOPY (EGD);  Surgeon: Irene Shipper, MD;  Location: [facility] ENDOSCOPY;  Service: Endoscopy;  Laterality: N/A;  Clarise Cruz /ja  . ESOPHAGOGASTRODUODENOSCOPY (EGD) WITH ESOPHAGEAL DILATION  05/10/2012   with  Propofol  . KNEE ARTHROSCOPY Bilateral   . MULTIPLE TOOTH EXTRACTIONS    . OPEN REDUCTION INTERNAL FIXATION (ORIF) METACARPAL Right 04/26/2019   Procedure: OPEN REDUCTION INTERNAL FIXATION (ORIF) RIGHT RING AND RIGHT SMALL METACARPAL FRACTURE;  Surgeon: Leanora Cover, MD;  Location: [place];  Service: Orthopedics;  Laterality: Right;  . TOTAL HIP ARTHROPLASTY Right 06/01/2013   Procedure: POSTERIOR RIGHT TOTAL HIP ARTHROPLASTY;  Surgeon: Alta Corning, MD;  Location: [place];  Service: Orthopedics;  Laterality: Right;   . TOTAL KNEE ARTHROPLASTY Left 11/28/2007  . TOTAL KNEE ARTHROPLASTY Right 02/16/2016   Procedure: TOTAL KNEE ARTHROPLASTY;  Surgeon: Dorna Leitz, MD;  Location: [place];  Service: Orthopedics;  Laterality: Right;  . TUBAL LIGATION  1991  . WRIST FUSION Left 08/29/2014   Procedure: LEFT WRIST ARTHRODESIS WRIST;  Surgeon: Leanora Cover, MD;  Location: [place];  Service: Orthopedics;  Laterality: Left;     OB History   No obstetric history on file.     Family History  Problem Relation Age of Onset  . Esophageal cancer Father   . Heart failure Mother   . Diabetes Mother   . Hypertension Mother   . High Cholesterol Mother   . Clotting disorder Daughter     Social History   Tobacco Use  . Smoking status: Current Every Day Smoker    Packs/day: 0.50    Years: 33.00    Pack years: 16.50    Types: Cigarettes  . Smokeless tobacco: Never Used  Vaping Use  . Vaping Use: Never used  Substance Use Topics  . Alcohol use: Yes    Comment: socially   . Drug use: No    Home Medications Prior to Admission medications   Medication Sig Start Date End Date Taking? Authorizing Provider  Adalimumab (HUMIRA) 40 MG/0.4ML PSKT Inject into the skin.    [provider]  AIMOVIG 70 MG/ML SOAJ INJECT THE CONTENTS OF 1 PEN INTO THE SKIN EVERY 30 DAYS 11/28/19   Penumalli, Earlean Polka, MD  cholecalciferol (VITAMIN D) 1000 units tablet Take 3,000 Units by mouth daily.    [provider]  citalopram (CELEXA) 20 MG tablet Take 20 mg by mouth daily.    [provider]  furosemide (LASIX) 40 MG tablet Take 40 mg by mouth. Patient not taking: Reported on 11/12/2019    [provider]  gabapentin (NEURONTIN) 300 MG capsule Take 300-600 mg by mouth 3 (three) times daily. 09/20/19   [provider]  hydrochlorothiazide (HYDRODIURIL) 25 MG tablet Take 25 mg by mouth daily.    [provider]  Ipratropium-Albuterol (COMBIVENT RESPIMAT) 20-100 MCG/ACT  AERS respimat Inhale 2 puffs into the lungs 3 (three) times daily as needed for wheezing.     [provider]  methocarbamol (ROBAXIN) 750 MG tablet Take 1 tablet (750 mg total) by mouth every 8 (eight) hours as needed for muscle spasms. 02/16/16   Gary Fleet, PA-C  omeprazole (PRILOSEC) 20 MG capsule Take 20 mg by mouth 2 (two) times daily before a meal.    [provider]  oxyCODONE (ROXICODONE) 5 MG immediate release tablet Take 20 mg by mouth as needed for severe pain. 5 x day as needed Patient not taking: Reported on 11/12/2019    [provider]  oxyCODONE (ROXICODONE) 5 MG immediate release tablet 1-2 tabs PO q6 hours prn pain Patient not taking: Reported on 11/12/2019 04/26/19   Leanora Cover, MD  oxyCODONE [facility] PO)  Take by mouth. Patient not taking: Reported on 11/12/2019    [provider]  rizatriptan (MAXALT-MLT) 10 MG disintegrating tablet Take 1 tablet (10 mg total) by mouth as needed for migraine. May repeat in 2 hours if needed 02/04/20   Penumalli, Earlean Polka, MD  traZODone (DESYREL) 100 MG tablet Take 100 mg by mouth at bedtime.    [provider]  vitamin B-12 (CYANOCOBALAMIN) 500 MCG tablet Take by mouth.    [provider]  Vitamin D, Ergocalciferol, (DRISDOL) 1.25 MG (50000 UNIT) CAPS capsule Take 50,000 Units by mouth once a week. 09/03/19   [provider]  famotidine (PEPCID) 40 MG tablet Take 40 mg by mouth at bedtime as needed. For acid reflux   03/20/11  [provider]  pantoprazole (PROTONIX) 20 MG tablet Take 1 tablet (20 mg total) by mouth daily. 02/28/11 03/17/11  Milton Ferguson, MD  potassium chloride (KLOR-CON) 10 MEQ CR tablet Take 10 mEq by mouth daily.    03/20/11  [provider]    Allergies    Penicillins, Tylenol [acetaminophen], Imitrex [sumatriptan], and Codeine  Review of Systems   Review of Systems  All other systems reviewed and are negative.   Physical Exam Updated  Vital Signs BP (!) 146/93   Pulse 80   Temp 98.4 F (36.9 C) (Oral)   Resp 18   Ht 5\' 9"  (1.753 m)   Wt 111.6 kg   LMP 02/27/2011   SpO2 94%   BMI 36.33 kg/m   Physical Exam Vitals and nursing note reviewed.  Constitutional:      Appearance: She is well-developed and well-nourished.  HENT:     Head: Normocephalic.  Eyes:     Extraocular Movements: EOM normal.  Cardiovascular:     Rate and Rhythm: Normal rate and regular rhythm.     Pulses: Normal pulses.  Pulmonary:     Effort: Pulmonary effort is normal.  Abdominal:     General: There is no distension.  Musculoskeletal:        General: Normal range of motion.     Cervical back: Normal range of motion.  Skin:    General: Skin is warm.  Neurological:     General: No focal deficit present.     Mental Status: She is alert and oriented to person, place, and time.  Psychiatric:        Mood and Affect: Mood and affect and mood normal.     ED Results / Procedures / Treatments   Labs (all labs ordered are listed, but only abnormal results are displayed) Labs Reviewed - No data to display  EKG None  Radiology DG Ribs Unilateral W/Chest Left  Result Date: 03/03/2020 CLINICAL DATA:  Fall EXAM: LEFT RIBS AND CHEST - 3+ VIEW COMPARISON:  Chest 02/11/2016 FINDINGS: Cardiac and mediastinal contours normal. Vascularity normal. Lungs are clear without infiltrate effusion or pneumothorax. Fractures left third fifth and sixth rib posterolaterally with mild displacement. ACDF lower cervical spine. IMPRESSION: Fractures left third, fifth, sixth ribs. No pneumothorax or effusion. Electronically Signed   By: Franchot Gallo M.D.   On: 03/03/2020 17:45   DG Lumbar Spine Complete  Result Date: 03/03/2020 CLINICAL DATA:  Fall EXAM: LUMBAR SPINE - COMPLETE 4+ VIEW COMPARISON:  02/20/2018 FINDINGS: Grade 1 anterolisthesis L5-S1 with anterior fusion, unchanged from the prior study. Remaining alignment normal Negative for fracture. Remaining  disc spaces intact. Right hip replacement. IMPRESSION: Negative for lumbar fracture. Electronically Signed   By: Juanda Crumble  Carlis Abbott M.D.   On: 03/03/2020 17:46    Procedures Procedures   Medications Ordered in ED Medications  oxyCODONE (Oxy IR/ROXICODONE) immediate release tablet 5 mg (5 mg Oral Given 03/03/20 1625)  HYDROmorphone (DILAUDID) injection 1 mg (1 mg Intravenous Given 03/03/20 1837)  ondansetron (ZOFRAN) injection 4 mg (4 mg Intravenous Given 03/03/20 1837)    ED Course  I have reviewed the triage vital signs and the nursing notes.  Pertinent labs & imaging results that were available during my care of the patient were reviewed by me and considered in my medical decision making (see chart for details).    MDM Rules/Calculators/A&P                          MDM:  Xray left ribs shows 3 rib fractures. Dr. Roderic Palau in to see and examine. Ct abdomen ordered.  Final Clinical Impression(s) / ED Diagnoses Final diagnoses:  Closed fracture of multiple ribs of left side, initial encounter    Rx / DC Orders ED Discharge Orders    None       Sidney Ace 03/03/20 1851    Milton Ferguson, MD 03/04/20 1154

## 2020-03-21 ENCOUNTER — Other Ambulatory Visit: Payer: Self-pay | Admitting: Diagnostic Neuroimaging

## 2020-04-11 DIAGNOSIS — H608X3 Other otitis externa, bilateral: Secondary | ICD-10-CM | POA: Diagnosis not present

## 2020-04-11 DIAGNOSIS — B369 Superficial mycosis, unspecified: Secondary | ICD-10-CM | POA: Diagnosis not present

## 2020-04-11 DIAGNOSIS — H6240 Otitis externa in other diseases classified elsewhere, unspecified ear: Secondary | ICD-10-CM | POA: Diagnosis not present

## 2020-04-28 DIAGNOSIS — K219 Gastro-esophageal reflux disease without esophagitis: Secondary | ICD-10-CM | POA: Diagnosis not present

## 2020-04-28 DIAGNOSIS — J4520 Mild intermittent asthma, uncomplicated: Secondary | ICD-10-CM | POA: Diagnosis not present

## 2020-04-28 DIAGNOSIS — F418 Other specified anxiety disorders: Secondary | ICD-10-CM | POA: Diagnosis not present

## 2020-04-28 DIAGNOSIS — G8929 Other chronic pain: Secondary | ICD-10-CM | POA: Diagnosis not present

## 2020-04-28 DIAGNOSIS — I10 Essential (primary) hypertension: Secondary | ICD-10-CM | POA: Diagnosis not present

## 2020-04-28 DIAGNOSIS — Z720 Tobacco use: Secondary | ICD-10-CM | POA: Diagnosis not present

## 2020-04-28 DIAGNOSIS — L409 Psoriasis, unspecified: Secondary | ICD-10-CM | POA: Diagnosis not present

## 2020-04-28 DIAGNOSIS — E785 Hyperlipidemia, unspecified: Secondary | ICD-10-CM | POA: Diagnosis not present

## 2020-04-28 DIAGNOSIS — L0293 Carbuncle, unspecified: Secondary | ICD-10-CM | POA: Diagnosis not present

## 2020-04-28 DIAGNOSIS — G43111 Migraine with aura, intractable, with status migrainosus: Secondary | ICD-10-CM | POA: Diagnosis not present

## 2020-04-28 DIAGNOSIS — E669 Obesity, unspecified: Secondary | ICD-10-CM | POA: Diagnosis not present

## 2020-04-28 DIAGNOSIS — E1165 Type 2 diabetes mellitus with hyperglycemia: Secondary | ICD-10-CM | POA: Diagnosis not present

## 2020-05-09 ENCOUNTER — Other Ambulatory Visit: Payer: Self-pay | Admitting: Diagnostic Neuroimaging

## 2020-05-26 DIAGNOSIS — M25559 Pain in unspecified hip: Secondary | ICD-10-CM | POA: Diagnosis not present

## 2020-05-26 DIAGNOSIS — Z79891 Long term (current) use of opiate analgesic: Secondary | ICD-10-CM | POA: Diagnosis not present

## 2020-05-26 DIAGNOSIS — Z79899 Other long term (current) drug therapy: Secondary | ICD-10-CM | POA: Diagnosis not present

## 2020-05-26 DIAGNOSIS — M5459 Other low back pain: Secondary | ICD-10-CM | POA: Diagnosis not present

## 2020-05-26 DIAGNOSIS — M47816 Spondylosis without myelopathy or radiculopathy, lumbar region: Secondary | ICD-10-CM | POA: Diagnosis not present

## 2020-05-26 DIAGNOSIS — G894 Chronic pain syndrome: Secondary | ICD-10-CM | POA: Diagnosis not present

## 2020-06-05 ENCOUNTER — Other Ambulatory Visit: Payer: Self-pay | Admitting: Diagnostic Neuroimaging

## 2020-06-26 DIAGNOSIS — M25551 Pain in right hip: Secondary | ICD-10-CM | POA: Diagnosis not present

## 2020-06-26 DIAGNOSIS — M67912 Unspecified disorder of synovium and tendon, left shoulder: Secondary | ICD-10-CM | POA: Diagnosis not present

## 2020-06-26 DIAGNOSIS — M25512 Pain in left shoulder: Secondary | ICD-10-CM | POA: Diagnosis not present

## 2020-07-02 DIAGNOSIS — I10 Essential (primary) hypertension: Secondary | ICD-10-CM | POA: Diagnosis not present

## 2020-07-02 DIAGNOSIS — E785 Hyperlipidemia, unspecified: Secondary | ICD-10-CM | POA: Diagnosis not present

## 2020-07-02 DIAGNOSIS — L409 Psoriasis, unspecified: Secondary | ICD-10-CM | POA: Diagnosis not present

## 2020-07-02 DIAGNOSIS — G8929 Other chronic pain: Secondary | ICD-10-CM | POA: Diagnosis not present

## 2020-07-02 DIAGNOSIS — K219 Gastro-esophageal reflux disease without esophagitis: Secondary | ICD-10-CM | POA: Diagnosis not present

## 2020-07-02 DIAGNOSIS — Z0000 Encounter for general adult medical examination without abnormal findings: Secondary | ICD-10-CM | POA: Diagnosis not present

## 2020-07-02 DIAGNOSIS — G43111 Migraine with aura, intractable, with status migrainosus: Secondary | ICD-10-CM | POA: Diagnosis not present

## 2020-07-02 DIAGNOSIS — L0293 Carbuncle, unspecified: Secondary | ICD-10-CM | POA: Diagnosis not present

## 2020-07-02 DIAGNOSIS — E669 Obesity, unspecified: Secondary | ICD-10-CM | POA: Diagnosis not present

## 2020-07-02 DIAGNOSIS — E1165 Type 2 diabetes mellitus with hyperglycemia: Secondary | ICD-10-CM | POA: Diagnosis not present

## 2020-07-02 DIAGNOSIS — F418 Other specified anxiety disorders: Secondary | ICD-10-CM | POA: Diagnosis not present

## 2020-07-02 DIAGNOSIS — J4520 Mild intermittent asthma, uncomplicated: Secondary | ICD-10-CM | POA: Diagnosis not present

## 2020-07-17 DIAGNOSIS — E1165 Type 2 diabetes mellitus with hyperglycemia: Secondary | ICD-10-CM | POA: Diagnosis not present

## 2020-07-17 DIAGNOSIS — F418 Other specified anxiety disorders: Secondary | ICD-10-CM | POA: Diagnosis not present

## 2020-07-17 DIAGNOSIS — R220 Localized swelling, mass and lump, head: Secondary | ICD-10-CM | POA: Diagnosis not present

## 2020-07-17 DIAGNOSIS — L409 Psoriasis, unspecified: Secondary | ICD-10-CM | POA: Diagnosis not present

## 2020-07-17 DIAGNOSIS — J4520 Mild intermittent asthma, uncomplicated: Secondary | ICD-10-CM | POA: Diagnosis not present

## 2020-07-17 DIAGNOSIS — T7840XA Allergy, unspecified, initial encounter: Secondary | ICD-10-CM | POA: Diagnosis not present

## 2020-07-17 DIAGNOSIS — K219 Gastro-esophageal reflux disease without esophagitis: Secondary | ICD-10-CM | POA: Diagnosis not present

## 2020-07-17 DIAGNOSIS — E785 Hyperlipidemia, unspecified: Secondary | ICD-10-CM | POA: Diagnosis not present

## 2020-07-17 DIAGNOSIS — E669 Obesity, unspecified: Secondary | ICD-10-CM | POA: Diagnosis not present

## 2020-07-17 DIAGNOSIS — G43111 Migraine with aura, intractable, with status migrainosus: Secondary | ICD-10-CM | POA: Diagnosis not present

## 2020-07-17 DIAGNOSIS — I10 Essential (primary) hypertension: Secondary | ICD-10-CM | POA: Diagnosis not present

## 2020-07-31 DIAGNOSIS — F418 Other specified anxiety disorders: Secondary | ICD-10-CM | POA: Diagnosis not present

## 2020-07-31 DIAGNOSIS — J4520 Mild intermittent asthma, uncomplicated: Secondary | ICD-10-CM | POA: Diagnosis not present

## 2020-07-31 DIAGNOSIS — N289 Disorder of kidney and ureter, unspecified: Secondary | ICD-10-CM | POA: Diagnosis not present

## 2020-07-31 DIAGNOSIS — E785 Hyperlipidemia, unspecified: Secondary | ICD-10-CM | POA: Diagnosis not present

## 2020-07-31 DIAGNOSIS — E669 Obesity, unspecified: Secondary | ICD-10-CM | POA: Diagnosis not present

## 2020-07-31 DIAGNOSIS — L409 Psoriasis, unspecified: Secondary | ICD-10-CM | POA: Diagnosis not present

## 2020-07-31 DIAGNOSIS — G43111 Migraine with aura, intractable, with status migrainosus: Secondary | ICD-10-CM | POA: Diagnosis not present

## 2020-07-31 DIAGNOSIS — K219 Gastro-esophageal reflux disease without esophagitis: Secondary | ICD-10-CM | POA: Diagnosis not present

## 2020-07-31 DIAGNOSIS — I10 Essential (primary) hypertension: Secondary | ICD-10-CM | POA: Diagnosis not present

## 2020-07-31 DIAGNOSIS — E1165 Type 2 diabetes mellitus with hyperglycemia: Secondary | ICD-10-CM | POA: Diagnosis not present

## 2020-07-31 DIAGNOSIS — G8929 Other chronic pain: Secondary | ICD-10-CM | POA: Diagnosis not present

## 2020-08-28 DIAGNOSIS — E669 Obesity, unspecified: Secondary | ICD-10-CM | POA: Diagnosis not present

## 2020-08-28 DIAGNOSIS — J4520 Mild intermittent asthma, uncomplicated: Secondary | ICD-10-CM | POA: Diagnosis not present

## 2020-08-28 DIAGNOSIS — E1165 Type 2 diabetes mellitus with hyperglycemia: Secondary | ICD-10-CM | POA: Diagnosis not present

## 2020-08-28 DIAGNOSIS — G8929 Other chronic pain: Secondary | ICD-10-CM | POA: Diagnosis not present

## 2020-08-28 DIAGNOSIS — K219 Gastro-esophageal reflux disease without esophagitis: Secondary | ICD-10-CM | POA: Diagnosis not present

## 2020-08-28 DIAGNOSIS — F418 Other specified anxiety disorders: Secondary | ICD-10-CM | POA: Diagnosis not present

## 2020-08-28 DIAGNOSIS — I10 Essential (primary) hypertension: Secondary | ICD-10-CM | POA: Diagnosis not present

## 2020-08-28 DIAGNOSIS — L409 Psoriasis, unspecified: Secondary | ICD-10-CM | POA: Diagnosis not present

## 2020-08-28 DIAGNOSIS — G43111 Migraine with aura, intractable, with status migrainosus: Secondary | ICD-10-CM | POA: Diagnosis not present

## 2020-08-28 DIAGNOSIS — E785 Hyperlipidemia, unspecified: Secondary | ICD-10-CM | POA: Diagnosis not present

## 2020-08-28 DIAGNOSIS — N289 Disorder of kidney and ureter, unspecified: Secondary | ICD-10-CM | POA: Diagnosis not present

## 2020-09-23 DIAGNOSIS — M25551 Pain in right hip: Secondary | ICD-10-CM | POA: Diagnosis not present

## 2020-09-23 DIAGNOSIS — M545 Low back pain, unspecified: Secondary | ICD-10-CM | POA: Diagnosis not present

## 2020-09-23 DIAGNOSIS — M5441 Lumbago with sciatica, right side: Secondary | ICD-10-CM | POA: Diagnosis not present

## 2020-10-01 ENCOUNTER — Other Ambulatory Visit: Payer: Self-pay | Admitting: Diagnostic Neuroimaging

## 2020-11-05 DIAGNOSIS — F418 Other specified anxiety disorders: Secondary | ICD-10-CM | POA: Diagnosis not present

## 2020-11-05 DIAGNOSIS — I10 Essential (primary) hypertension: Secondary | ICD-10-CM | POA: Diagnosis not present

## 2020-11-05 DIAGNOSIS — E669 Obesity, unspecified: Secondary | ICD-10-CM | POA: Diagnosis not present

## 2020-11-05 DIAGNOSIS — L409 Psoriasis, unspecified: Secondary | ICD-10-CM | POA: Diagnosis not present

## 2020-11-05 DIAGNOSIS — E785 Hyperlipidemia, unspecified: Secondary | ICD-10-CM | POA: Diagnosis not present

## 2020-11-05 DIAGNOSIS — G43111 Migraine with aura, intractable, with status migrainosus: Secondary | ICD-10-CM | POA: Diagnosis not present

## 2020-11-05 DIAGNOSIS — K219 Gastro-esophageal reflux disease without esophagitis: Secondary | ICD-10-CM | POA: Diagnosis not present

## 2020-11-05 DIAGNOSIS — N289 Disorder of kidney and ureter, unspecified: Secondary | ICD-10-CM | POA: Diagnosis not present

## 2020-11-05 DIAGNOSIS — J4520 Mild intermittent asthma, uncomplicated: Secondary | ICD-10-CM | POA: Diagnosis not present

## 2020-11-05 DIAGNOSIS — E1165 Type 2 diabetes mellitus with hyperglycemia: Secondary | ICD-10-CM | POA: Diagnosis not present

## 2020-11-05 DIAGNOSIS — G8929 Other chronic pain: Secondary | ICD-10-CM | POA: Diagnosis not present

## 2021-02-17 DIAGNOSIS — E785 Hyperlipidemia, unspecified: Secondary | ICD-10-CM | POA: Diagnosis not present

## 2021-02-17 DIAGNOSIS — Z0000 Encounter for general adult medical examination without abnormal findings: Secondary | ICD-10-CM | POA: Diagnosis not present

## 2021-02-17 DIAGNOSIS — E669 Obesity, unspecified: Secondary | ICD-10-CM | POA: Diagnosis not present

## 2021-02-17 DIAGNOSIS — L409 Psoriasis, unspecified: Secondary | ICD-10-CM | POA: Diagnosis not present

## 2021-02-17 DIAGNOSIS — J4520 Mild intermittent asthma, uncomplicated: Secondary | ICD-10-CM | POA: Diagnosis not present

## 2021-02-17 DIAGNOSIS — I10 Essential (primary) hypertension: Secondary | ICD-10-CM | POA: Diagnosis not present

## 2021-02-17 DIAGNOSIS — H60311 Diffuse otitis externa, right ear: Secondary | ICD-10-CM | POA: Diagnosis not present

## 2021-02-17 DIAGNOSIS — G8929 Other chronic pain: Secondary | ICD-10-CM | POA: Diagnosis not present

## 2021-02-17 DIAGNOSIS — G43111 Migraine with aura, intractable, with status migrainosus: Secondary | ICD-10-CM | POA: Diagnosis not present

## 2021-02-17 DIAGNOSIS — K219 Gastro-esophageal reflux disease without esophagitis: Secondary | ICD-10-CM | POA: Diagnosis not present

## 2021-02-17 DIAGNOSIS — E1165 Type 2 diabetes mellitus with hyperglycemia: Secondary | ICD-10-CM | POA: Diagnosis not present

## 2021-02-17 DIAGNOSIS — N289 Disorder of kidney and ureter, unspecified: Secondary | ICD-10-CM | POA: Diagnosis not present

## 2021-02-17 DIAGNOSIS — F418 Other specified anxiety disorders: Secondary | ICD-10-CM | POA: Diagnosis not present

## 2021-02-23 DIAGNOSIS — L409 Psoriasis, unspecified: Secondary | ICD-10-CM | POA: Diagnosis not present

## 2021-02-23 DIAGNOSIS — Z5181 Encounter for therapeutic drug level monitoring: Secondary | ICD-10-CM | POA: Diagnosis not present

## 2021-03-05 ENCOUNTER — Encounter: Payer: Self-pay | Admitting: *Deleted

## 2021-03-05 ENCOUNTER — Telehealth: Payer: Self-pay | Admitting: Diagnostic Neuroimaging

## 2021-03-05 DIAGNOSIS — G43109 Migraine with aura, not intractable, without status migrainosus: Secondary | ICD-10-CM

## 2021-03-05 NOTE — Telephone Encounter (Signed)
Pt was told by the pharmacy need PA for  Erenumab-aooe ([place]) 26 MG/ML SOAJ Would like a call from the nurse.

## 2021-03-05 NOTE — Telephone Encounter (Signed)
I sent my chart advising her to call and schedule a FU and to send photos of her new insurance cards.

## 2021-03-09 ENCOUNTER — Encounter: Payer: Self-pay | Admitting: *Deleted

## 2021-03-09 MED ORDER — AIMOVIG 70 MG/ML ~~LOC~~ SOAJ: 1.0000 "pen " | SUBCUTANEOUS | 2 refills | Status: DC

## 2021-03-09 NOTE — Telephone Encounter (Signed)
Aimovig 70 mg PA, key [id number], G43.109.  Your information has been sent to [employer]. Of note, she was last seen 11/2019; I have made her aware she needs to schedule a FU, amy be VV if she is doing well.

## 2021-03-09 NOTE — Telephone Encounter (Signed)
FU has been scheduled for April with Amy, NP.   Aimovig Approved through 03/09/2022. Will refill x 3 months, further refills after VV is completed.

## 2021-03-10 ENCOUNTER — Other Ambulatory Visit: Payer: Self-pay | Admitting: Physician Assistant

## 2021-03-10 DIAGNOSIS — Z1231 Encounter for screening mammogram for malignant neoplasm of breast: Secondary | ICD-10-CM

## 2021-03-15 ENCOUNTER — Other Ambulatory Visit: Payer: Self-pay | Admitting: Diagnostic Neuroimaging

## 2021-03-17 DIAGNOSIS — Z9889 Other specified postprocedural states: Secondary | ICD-10-CM | POA: Diagnosis not present

## 2021-03-17 DIAGNOSIS — M47817 Spondylosis without myelopathy or radiculopathy, lumbosacral region: Secondary | ICD-10-CM | POA: Diagnosis not present

## 2021-03-17 DIAGNOSIS — Z79899 Other long term (current) drug therapy: Secondary | ICD-10-CM | POA: Diagnosis not present

## 2021-03-17 DIAGNOSIS — Z5181 Encounter for therapeutic drug level monitoring: Secondary | ICD-10-CM | POA: Diagnosis not present

## 2021-03-17 DIAGNOSIS — M549 Dorsalgia, unspecified: Secondary | ICD-10-CM | POA: Diagnosis not present

## 2021-03-17 DIAGNOSIS — M47814 Spondylosis without myelopathy or radiculopathy, thoracic region: Secondary | ICD-10-CM | POA: Diagnosis not present

## 2021-03-23 ENCOUNTER — Telehealth (INDEPENDENT_AMBULATORY_CARE_PROVIDER_SITE_OTHER): Payer: Medicaid Other | Admitting: Diagnostic Neuroimaging

## 2021-03-23 DIAGNOSIS — G43109 Migraine with aura, not intractable, without status migrainosus: Secondary | ICD-10-CM | POA: Diagnosis not present

## 2021-03-23 MED ORDER — RIZATRIPTAN BENZOATE 10 MG PO TBDP: ORAL_TABLET | ORAL | 6 refills | Status: DC

## 2021-03-23 MED ORDER — AIMOVIG 70 MG/ML ~~LOC~~ SOAJ: 1.0000 "pen " | SUBCUTANEOUS | 4 refills | Status: DC

## 2021-03-23 NOTE — Progress Notes (Signed)
[facility]  PATIENT: Michelle [name] DOB: [birth year]  REFERRING CLINICIAN: Trey Sailors, PA HISTORY FROM: patient  REASON FOR VISIT: follow up   HISTORICAL  CHIEF COMPLAINT:  Chief Complaint  Patient presents with   Migraine    HISTORY OF PRESENT ILLNESS:   Virtual Visit via Telephone Note  I connected withNAME@ on 03/23/21 at  2:00 PM EDT by telephone and verified that I am speaking with the correct person using two identifiers.   I discussed the limitations, risks, security and privacy concerns of performing an evaluation and management service by telephone and the availability of in person appointments. I also discussed with the patient that there may be a patient responsible charge related to this service. The patient expressed understanding and agreed to proceed. Patient is at home and I am at the office.   UPDATE (03/23/21, VRP): Since last visit, doing well. Symptoms are stable. Severity is mild. No alleviating or aggravating factors. Tolerating meds.   UPDATE (11/12/19, VRP): Since last visit, doing well. HA now 2-3 per month. Symptoms are improved. Severity is mild. No alleviating or aggravating factors. Tolerating aimovig and rizatriptan.     PRIOR HPI: 56 year old female here for evaluation of headaches.   Patient has had a since childhood with severe head and scalp pain.  At that time she was told that headaches were related to excessively long hair.  Patient had some nausea with her headaches.  Over time she outgrew these headaches.  Patient also has family history of migraine headaches in her mother.   2008 patient had recurrence of headaches.  This time with frontal and right-sided pain and pressure with throbbing sensation, nausea, sensitivity to light and sound.  Patient has 2-3 severe headaches per month lasting days at a time.  She averages 8 to 12 days of headache per month.  Patient has been diagnosed with migraine headache.  She  tried topiramate but this caused nausea.  She tried Imitrex without relief.  She tried over-the-counter medicines including Tylenol ibuprofen without relief.  She is also on Friday medications for mood and pain control including duloxetine, Celexa, oxycodone, Robaxin.   REVIEW OF SYSTEMS: Full 14 system review of systems performed and negative with exception of: as per HPI.  ALLERGIES: Allergies  Allergen Reactions   Penicillins Itching, Nausea And Vomiting and Rash    Has patient had a PCN reaction causing immediate rash, facial/tongue/throat swelling, SOB or lightheadedness with hypotension: #  #  #  YES  #  #  #  Has patient had a PCN reaction causing severe rash involving mucus membranes or skin necrosis: No Has patient had a PCN reaction that required hospitalization No Has patient had a PCN reaction occurring within the last 10 years: No If all of the above answers are "NO", then may proceed with Cephalosporin use.    Tylenol [Acetaminophen] Other (See Comments)    Dr advised not to take due to kidney failure in 12   Imitrex [Sumatriptan] Itching    sleepiness   Codeine Itching    HOME MEDICATIONS: Outpatient Medications Prior to Visit  Medication Sig Dispense Refill   Adalimumab (HUMIRA) 40 MG/0.4ML PSKT Inject into the skin.     cholecalciferol (VITAMIN D) 1000 units tablet Take 3,000 Units by mouth daily.     citalopram (CELEXA) 20 MG tablet Take 20 mg by mouth daily.     furosemide (LASIX) 40 MG tablet Take 40 mg by mouth. (Patient not taking: Reported  on 11/12/2019)     gabapentin (NEURONTIN) 300 MG capsule Take 300-600 mg by mouth 3 (three) times daily.     hydrochlorothiazide (HYDRODIURIL) 25 MG tablet Take 25 mg by mouth daily.     Ipratropium-Albuterol (COMBIVENT RESPIMAT) 20-100 MCG/ACT AERS respimat Inhale 2 puffs into the lungs 3 (three) times daily as needed for wheezing.      methocarbamol (ROBAXIN) 750 MG tablet Take 1 tablet (750 mg total) by mouth every 8 (eight)  hours as needed for muscle spasms. 50 tablet 0   omeprazole (PRILOSEC) 20 MG capsule Take 20 mg by mouth 2 (two) times daily before a meal.     oxyCODONE (ROXICODONE) 5 MG immediate release tablet Take 1 tablet (5 mg total) by mouth every 6 (six) hours as needed for moderate pain or severe pain. 20 tablet 0   oxyCODONE (XTAMPZA ER PO) Take by mouth. (Patient not taking: Reported on 11/12/2019)     traZODone (DESYREL) 100 MG tablet Take 100 mg by mouth at bedtime.     vitamin B-12 (CYANOCOBALAMIN) 500 MCG tablet Take by mouth.     Vitamin D, Ergocalciferol, (DRISDOL) 1.25 MG (50000 UNIT) CAPS capsule Take 50,000 Units by mouth once a week.     Erenumab-aooe (AIMOVIG) 70 MG/ML SOAJ Inject 1 pen into the skin every 30 (thirty) days. Must keep April FU for more refills 1 mL 2   rizatriptan (MAXALT-MLT) 10 MG disintegrating tablet DISSOLVE 1 TABLET BY MOUTH AS NEEDED FOR MIGRAINE. MAY REPEAT IN 2 HOURS IF NEEDED. 9 tablet 0   No facility-administered medications prior to visit.      PHYSICAL EXAM  Telephone visit  DIAGNOSTIC DATA (LABS, IMAGING, TESTING) - I reviewed patient records, labs, notes, testing and imaging myself where available.  Lab Results  Component Value Date   WBC 6.4 03/03/2020   HGB 15.4 (H) 03/03/2020   HCT 46.6 (H) 03/03/2020   MCV 98.1 03/03/2020   PLT 132 (L) 03/03/2020      Component Value Date/Time   NA 135 03/03/2020 1845   K 3.3 (L) 03/03/2020 1845   CL 97 (L) 03/03/2020 1845   CO2 29 03/03/2020 1845   GLUCOSE 120 (H) 03/03/2020 1845   BUN 16 03/03/2020 1845   CREATININE 1.00 03/03/2020 1845   CALCIUM 8.9 03/03/2020 1845   PROT 8.2 (H) 03/03/2020 1845   ALBUMIN 3.7 03/03/2020 1845   AST 19 03/03/2020 1845   ALT 17 03/03/2020 1845   ALKPHOS 91 03/03/2020 1845   BILITOT 0.7 03/03/2020 1845   GFRNONAA >60 03/03/2020 1845   GFRAA >60 04/24/2019 1344   Lab Results  Component Value Date   CHOL 127 04/26/2008   HDL 46 04/26/2008   LDLCALC 66  04/26/2008   TRIG 73 04/26/2008   CHOLHDL 2.8 Ratio 04/26/2008   Lab Results  Component Value Date   HGBA1C 5.7 (H) 02/11/2016   No results found for: VITAMINB12 No results found for: TSH   05/14/11 Normal unenhanced cranial CT.    ASSESSMENT AND PLAN  56 y.o. year old female here with:   Dx:  1. Migraine with aura and without status migrainosus, not intractable      PLAN:  MIGRAINE WITH AURA  MIGRAINE PREVENTION  --> continue aimovig (improving on medication)   MIGRAINE RESCUE  --> rizatriptan '10mg'$  as needed for breakthrough headache; may repeat x 1 after 2 hours; max 2 tabs per day or 8 per month  Meds ordered this encounter  Medications  Erenumab-aooe (AIMOVIG) 70 MG/ML SOAJ    Sig: Inject 1 pen into the skin every 30 (thirty) days.    Dispense:  3 mL    Refill:  4   rizatriptan (MAXALT-MLT) 10 MG disintegrating tablet    Sig: DISSOLVE 1 TABLET BY MOUTH AS NEEDED FOR MIGRAINE. MAY REPEAT IN 2 HOURS IF NEEDED.    Dispense:  9 tablet    Refill:  6    Return for return to PCP. Future refills per PCP    Penni Bombard, MD 01/12/8900, 2:84 PM Certified in Neurology, Neurophysiology and Neuroimaging  [facility] [address], [place], [place] [address] [phone]

## 2021-03-25 ENCOUNTER — Encounter: Payer: Self-pay | Admitting: Diagnostic Neuroimaging

## 2021-03-25 ENCOUNTER — Telehealth: Payer: Self-pay

## 2021-03-25 NOTE — Telephone Encounter (Signed)
PA for aimovig  ? (Key: [id number]) ? ?[employer] is reviewing your PA request. Typically an electronic response will be received within 24-72 hours. To check for an update later, open this request from your dashboard. ? ?You may close this dialog and return to your dashboard to perform other tasks. ? ? ?

## 2021-03-25 NOTE — Telephone Encounter (Signed)
Received fax from [facility]: Aimovig already approved until 03/09/2022 [id number]. Faxed approval letter to pharmacy.  ?

## 2021-04-21 DIAGNOSIS — L409 Psoriasis, unspecified: Secondary | ICD-10-CM | POA: Diagnosis not present

## 2021-04-21 DIAGNOSIS — E1165 Type 2 diabetes mellitus with hyperglycemia: Secondary | ICD-10-CM | POA: Diagnosis not present

## 2021-04-21 DIAGNOSIS — E785 Hyperlipidemia, unspecified: Secondary | ICD-10-CM | POA: Diagnosis not present

## 2021-04-21 DIAGNOSIS — G43111 Migraine with aura, intractable, with status migrainosus: Secondary | ICD-10-CM | POA: Diagnosis not present

## 2021-04-21 DIAGNOSIS — J4520 Mild intermittent asthma, uncomplicated: Secondary | ICD-10-CM | POA: Diagnosis not present

## 2021-04-21 DIAGNOSIS — F418 Other specified anxiety disorders: Secondary | ICD-10-CM | POA: Diagnosis not present

## 2021-04-21 DIAGNOSIS — G8929 Other chronic pain: Secondary | ICD-10-CM | POA: Diagnosis not present

## 2021-04-21 DIAGNOSIS — N898 Other specified noninflammatory disorders of vagina: Secondary | ICD-10-CM | POA: Diagnosis not present

## 2021-04-21 DIAGNOSIS — K219 Gastro-esophageal reflux disease without esophagitis: Secondary | ICD-10-CM | POA: Diagnosis not present

## 2021-04-21 DIAGNOSIS — E669 Obesity, unspecified: Secondary | ICD-10-CM | POA: Diagnosis not present

## 2021-04-21 DIAGNOSIS — Z113 Encounter for screening for infections with a predominantly sexual mode of transmission: Secondary | ICD-10-CM | POA: Diagnosis not present

## 2021-04-21 DIAGNOSIS — I10 Essential (primary) hypertension: Secondary | ICD-10-CM | POA: Diagnosis not present

## 2021-04-22 ENCOUNTER — Ambulatory Visit: Payer: Medicaid Other

## 2021-04-28 ENCOUNTER — Telehealth: Payer: Medicaid Other | Admitting: Family Medicine

## 2021-05-06 DIAGNOSIS — M47814 Spondylosis without myelopathy or radiculopathy, thoracic region: Secondary | ICD-10-CM | POA: Diagnosis not present

## 2021-05-06 DIAGNOSIS — Z9889 Other specified postprocedural states: Secondary | ICD-10-CM | POA: Diagnosis not present

## 2021-05-06 DIAGNOSIS — M47817 Spondylosis without myelopathy or radiculopathy, lumbosacral region: Secondary | ICD-10-CM | POA: Diagnosis not present

## 2021-05-06 DIAGNOSIS — Z79899 Other long term (current) drug therapy: Secondary | ICD-10-CM | POA: Diagnosis not present

## 2021-05-06 DIAGNOSIS — Z5181 Encounter for therapeutic drug level monitoring: Secondary | ICD-10-CM | POA: Diagnosis not present

## 2021-05-14 DIAGNOSIS — H608X3 Other otitis externa, bilateral: Secondary | ICD-10-CM | POA: Diagnosis not present

## 2021-05-14 DIAGNOSIS — H6091 Unspecified otitis externa, right ear: Secondary | ICD-10-CM | POA: Diagnosis not present

## 2021-06-22 DIAGNOSIS — G43111 Migraine with aura, intractable, with status migrainosus: Secondary | ICD-10-CM | POA: Diagnosis not present

## 2021-06-22 DIAGNOSIS — E669 Obesity, unspecified: Secondary | ICD-10-CM | POA: Diagnosis not present

## 2021-06-22 DIAGNOSIS — F418 Other specified anxiety disorders: Secondary | ICD-10-CM | POA: Diagnosis not present

## 2021-06-22 DIAGNOSIS — E1165 Type 2 diabetes mellitus with hyperglycemia: Secondary | ICD-10-CM | POA: Diagnosis not present

## 2021-06-22 DIAGNOSIS — L409 Psoriasis, unspecified: Secondary | ICD-10-CM | POA: Diagnosis not present

## 2021-06-22 DIAGNOSIS — G8929 Other chronic pain: Secondary | ICD-10-CM | POA: Diagnosis not present

## 2021-06-22 DIAGNOSIS — E785 Hyperlipidemia, unspecified: Secondary | ICD-10-CM | POA: Diagnosis not present

## 2021-06-22 DIAGNOSIS — J4520 Mild intermittent asthma, uncomplicated: Secondary | ICD-10-CM | POA: Diagnosis not present

## 2021-06-22 DIAGNOSIS — K219 Gastro-esophageal reflux disease without esophagitis: Secondary | ICD-10-CM | POA: Diagnosis not present

## 2021-06-22 DIAGNOSIS — I10 Essential (primary) hypertension: Secondary | ICD-10-CM | POA: Diagnosis not present

## 2021-07-24 DIAGNOSIS — G43111 Migraine with aura, intractable, with status migrainosus: Secondary | ICD-10-CM | POA: Diagnosis not present

## 2021-07-24 DIAGNOSIS — F418 Other specified anxiety disorders: Secondary | ICD-10-CM | POA: Diagnosis not present

## 2021-07-24 DIAGNOSIS — E669 Obesity, unspecified: Secondary | ICD-10-CM | POA: Diagnosis not present

## 2021-07-24 DIAGNOSIS — G8929 Other chronic pain: Secondary | ICD-10-CM | POA: Diagnosis not present

## 2021-07-24 DIAGNOSIS — I10 Essential (primary) hypertension: Secondary | ICD-10-CM | POA: Diagnosis not present

## 2021-07-24 DIAGNOSIS — E785 Hyperlipidemia, unspecified: Secondary | ICD-10-CM | POA: Diagnosis not present

## 2021-07-24 DIAGNOSIS — K219 Gastro-esophageal reflux disease without esophagitis: Secondary | ICD-10-CM | POA: Diagnosis not present

## 2021-07-24 DIAGNOSIS — E1165 Type 2 diabetes mellitus with hyperglycemia: Secondary | ICD-10-CM | POA: Diagnosis not present

## 2021-07-24 DIAGNOSIS — L409 Psoriasis, unspecified: Secondary | ICD-10-CM | POA: Diagnosis not present

## 2021-07-24 DIAGNOSIS — J4520 Mild intermittent asthma, uncomplicated: Secondary | ICD-10-CM | POA: Diagnosis not present

## 2021-07-30 ENCOUNTER — Encounter: Payer: Self-pay | Admitting: Internal Medicine

## 2021-08-27 DIAGNOSIS — M5451 Vertebrogenic low back pain: Secondary | ICD-10-CM | POA: Diagnosis not present

## 2021-08-27 DIAGNOSIS — M7918 Myalgia, other site: Secondary | ICD-10-CM | POA: Diagnosis not present

## 2021-08-27 DIAGNOSIS — M533 Sacrococcygeal disorders, not elsewhere classified: Secondary | ICD-10-CM | POA: Diagnosis not present

## 2021-09-17 DIAGNOSIS — E1165 Type 2 diabetes mellitus with hyperglycemia: Secondary | ICD-10-CM | POA: Diagnosis not present

## 2021-09-17 DIAGNOSIS — E669 Obesity, unspecified: Secondary | ICD-10-CM | POA: Diagnosis not present

## 2021-09-17 DIAGNOSIS — G8929 Other chronic pain: Secondary | ICD-10-CM | POA: Diagnosis not present

## 2021-09-17 DIAGNOSIS — F418 Other specified anxiety disorders: Secondary | ICD-10-CM | POA: Diagnosis not present

## 2021-09-17 DIAGNOSIS — K219 Gastro-esophageal reflux disease without esophagitis: Secondary | ICD-10-CM | POA: Diagnosis not present

## 2021-09-17 DIAGNOSIS — I10 Essential (primary) hypertension: Secondary | ICD-10-CM | POA: Diagnosis not present

## 2021-09-17 DIAGNOSIS — G43111 Migraine with aura, intractable, with status migrainosus: Secondary | ICD-10-CM | POA: Diagnosis not present

## 2021-09-17 DIAGNOSIS — L409 Psoriasis, unspecified: Secondary | ICD-10-CM | POA: Diagnosis not present

## 2021-09-17 DIAGNOSIS — J4520 Mild intermittent asthma, uncomplicated: Secondary | ICD-10-CM | POA: Diagnosis not present

## 2021-09-17 DIAGNOSIS — E785 Hyperlipidemia, unspecified: Secondary | ICD-10-CM | POA: Diagnosis not present

## 2021-11-24 ENCOUNTER — Telehealth: Payer: Self-pay | Admitting: Diagnostic Neuroimaging

## 2021-11-24 NOTE — Telephone Encounter (Signed)
Pt called stating that she has left her Aimovig out on the table for 10 days and she was wanting to know if it is still good to take. Pt was informed that it can not be out for more than 7 days. Pt will be calling the insurance company to see if they can authorize a replacement.

## 2021-11-24 NOTE — Telephone Encounter (Signed)
If insurance will not allow another to be processed, the pt can also sometimes call the [employer]. [phone]) 8AM-9PM ET, Mon-Fri  They have a form that can sometimes be completed advising there was a problem.  **If pt calls back please provide with the above information to try as well

## 2022-01-08 DIAGNOSIS — E785 Hyperlipidemia, unspecified: Secondary | ICD-10-CM | POA: Diagnosis not present

## 2022-01-08 DIAGNOSIS — E1165 Type 2 diabetes mellitus with hyperglycemia: Secondary | ICD-10-CM | POA: Diagnosis not present

## 2022-01-08 DIAGNOSIS — G8929 Other chronic pain: Secondary | ICD-10-CM | POA: Diagnosis not present

## 2022-01-08 DIAGNOSIS — K219 Gastro-esophageal reflux disease without esophagitis: Secondary | ICD-10-CM | POA: Diagnosis not present

## 2022-01-08 DIAGNOSIS — L409 Psoriasis, unspecified: Secondary | ICD-10-CM | POA: Diagnosis not present

## 2022-01-08 DIAGNOSIS — J4520 Mild intermittent asthma, uncomplicated: Secondary | ICD-10-CM | POA: Diagnosis not present

## 2022-01-08 DIAGNOSIS — E669 Obesity, unspecified: Secondary | ICD-10-CM | POA: Diagnosis not present

## 2022-01-08 DIAGNOSIS — F418 Other specified anxiety disorders: Secondary | ICD-10-CM | POA: Diagnosis not present

## 2022-01-08 DIAGNOSIS — I10 Essential (primary) hypertension: Secondary | ICD-10-CM | POA: Diagnosis not present

## 2022-01-08 DIAGNOSIS — G43111 Migraine with aura, intractable, with status migrainosus: Secondary | ICD-10-CM | POA: Diagnosis not present

## 2022-02-09 ENCOUNTER — Ambulatory Visit: Payer: Medicaid Other | Admitting: Podiatry

## 2022-02-11 DIAGNOSIS — M5441 Lumbago with sciatica, right side: Secondary | ICD-10-CM | POA: Diagnosis not present

## 2022-02-24 ENCOUNTER — Other Ambulatory Visit: Payer: Self-pay | Admitting: Diagnostic Neuroimaging

## 2022-02-24 DIAGNOSIS — G43109 Migraine with aura, not intractable, without status migrainosus: Secondary | ICD-10-CM

## 2022-04-07 ENCOUNTER — Telehealth: Payer: Self-pay | Admitting: Diagnostic Neuroimaging

## 2022-04-07 NOTE — Telephone Encounter (Signed)
Pt called stating her  Erenumab-aooe (AIMOVIG) 48 MG/ML SOAJ is needing a PA. Please advise.

## 2022-04-07 NOTE — Telephone Encounter (Signed)
Called pt. Left voicemail message to please call the office.

## 2022-04-10 ENCOUNTER — Telehealth: Payer: Self-pay | Admitting: Diagnostic Neuroimaging

## 2022-04-10 NOTE — Telephone Encounter (Signed)
Patient called in about aimovig medication needing PA. -VRP

## 2022-04-12 NOTE — Telephone Encounter (Signed)
PA submitted via CMM,  Key: [id number]  Your information has been sent to [employer], awaiting determination

## 2022-04-13 NOTE — Telephone Encounter (Signed)
Patient Aimovig injection 70mg /ML was approve from [facility] on 04/12/22 - 04/12/23. [id number]

## 2022-04-13 NOTE — Telephone Encounter (Signed)
Contacted pt, she has been notified of approval

## 2022-04-13 NOTE — Progress Notes (Deleted)
   PATIENT: Michelle [name] DOB: 12/25/1965  REASON FOR VISIT: follow up HISTORY FROM: patient  Virtual Visit via Telephone Note  I connected with Michelle [name] on 04/13/22 at  9:30 AM EDT by telephone and verified that I am speaking with the correct person using two identifiers.   I discussed the limitations, risks, security and privacy concerns of performing an evaluation and management service by telephone and the availability of in person appointments. I also discussed with the patient that there may be a patient responsible charge related to this service. The patient expressed understanding and agreed to proceed.   History of Present Illness:  04/13/22 ALL (Mychart): [name] is a 58 y.o. female here today for follow up for migraines. She continues Amovig 70mg  every 30 days and rizatriptan as needed.   03/23/21 VRP (Mychart): Since last visit, doing well. Symptoms are stable. Severity is mild. No alleviating or aggravating factors. Tolerating meds.    Observations/Objective:  Generalized: Well developed, in no acute distress  Mentation: Alert oriented to time, place, history taking. Follows all commands speech and language fluent   Assessment and Plan:  57 y.o. year old female  has a past medical history of Anxiety, Arthritis, Asthma, Barrett's esophagus, Bipolar disorder ([place]), Depression, GERD (gastroesophageal reflux disease), History of acute renal failure (2012), History of gastric ulcer, History of MRSA infection (2014), Hypertension, IBS (irritable bowel syndrome), Kienbck's disease (08/2014), Migraine equivalent, Obesity, Pre-diabetes, and Psoriasis. here with    ICD-10-CM   1. Migraine with aura and without status migrainosus, not intractable  G43.109       [name] continues to do well on Amovig 70mg  every 30 days and rizatriptan as needed. We will continue current treatment plan. Healthy lifestyle habits encouraged. She will follow up in 1 year,  sooner if needed.   No orders of the defined types were placed in this encounter.   No orders of the defined types were placed in this encounter.    Follow Up Instructions:  I discussed the assessment and treatment plan with the patient. The patient was provided an opportunity to ask questions and all were answered. The patient agreed with the plan and demonstrated an understanding of the instructions.   The patient was advised to call back or seek an in-person evaluation if the symptoms worsen or if the condition fails to improve as anticipated.  I provided *** minutes of non-face-to-face time during this encounter. Patient located at their place of residence during [place] visit. Provider is in the office.    Debbora Presto, NP

## 2022-04-20 ENCOUNTER — Telehealth: Payer: Medicaid Other | Admitting: Family Medicine

## 2022-04-20 DIAGNOSIS — G43109 Migraine with aura, not intractable, without status migrainosus: Secondary | ICD-10-CM

## 2022-05-12 DIAGNOSIS — J4520 Mild intermittent asthma, uncomplicated: Secondary | ICD-10-CM | POA: Diagnosis not present

## 2022-05-12 DIAGNOSIS — G43111 Migraine with aura, intractable, with status migrainosus: Secondary | ICD-10-CM | POA: Diagnosis not present

## 2022-05-12 DIAGNOSIS — F418 Other specified anxiety disorders: Secondary | ICD-10-CM | POA: Diagnosis not present

## 2022-05-12 DIAGNOSIS — I10 Essential (primary) hypertension: Secondary | ICD-10-CM | POA: Diagnosis not present

## 2022-05-12 DIAGNOSIS — E669 Obesity, unspecified: Secondary | ICD-10-CM | POA: Diagnosis not present

## 2022-05-12 DIAGNOSIS — G8929 Other chronic pain: Secondary | ICD-10-CM | POA: Diagnosis not present

## 2022-05-12 DIAGNOSIS — K219 Gastro-esophageal reflux disease without esophagitis: Secondary | ICD-10-CM | POA: Diagnosis not present

## 2022-05-12 DIAGNOSIS — E785 Hyperlipidemia, unspecified: Secondary | ICD-10-CM | POA: Diagnosis not present

## 2022-05-12 DIAGNOSIS — E1165 Type 2 diabetes mellitus with hyperglycemia: Secondary | ICD-10-CM | POA: Diagnosis not present

## 2022-05-12 DIAGNOSIS — L409 Psoriasis, unspecified: Secondary | ICD-10-CM | POA: Diagnosis not present

## 2022-05-12 DIAGNOSIS — Z0000 Encounter for general adult medical examination without abnormal findings: Secondary | ICD-10-CM | POA: Diagnosis not present

## 2022-05-13 ENCOUNTER — Other Ambulatory Visit: Payer: Self-pay | Admitting: Physician Assistant

## 2022-05-13 DIAGNOSIS — Z1231 Encounter for screening mammogram for malignant neoplasm of breast: Secondary | ICD-10-CM

## 2022-05-26 DIAGNOSIS — F418 Other specified anxiety disorders: Secondary | ICD-10-CM | POA: Diagnosis not present

## 2022-05-26 DIAGNOSIS — G8929 Other chronic pain: Secondary | ICD-10-CM | POA: Diagnosis not present

## 2022-05-26 DIAGNOSIS — N289 Disorder of kidney and ureter, unspecified: Secondary | ICD-10-CM | POA: Diagnosis not present

## 2022-05-26 DIAGNOSIS — G43111 Migraine with aura, intractable, with status migrainosus: Secondary | ICD-10-CM | POA: Diagnosis not present

## 2022-05-26 DIAGNOSIS — L409 Psoriasis, unspecified: Secondary | ICD-10-CM | POA: Diagnosis not present

## 2022-05-26 DIAGNOSIS — E669 Obesity, unspecified: Secondary | ICD-10-CM | POA: Diagnosis not present

## 2022-05-26 DIAGNOSIS — K219 Gastro-esophageal reflux disease without esophagitis: Secondary | ICD-10-CM | POA: Diagnosis not present

## 2022-05-26 DIAGNOSIS — E785 Hyperlipidemia, unspecified: Secondary | ICD-10-CM | POA: Diagnosis not present

## 2022-05-26 DIAGNOSIS — J4520 Mild intermittent asthma, uncomplicated: Secondary | ICD-10-CM | POA: Diagnosis not present

## 2022-05-26 DIAGNOSIS — E1165 Type 2 diabetes mellitus with hyperglycemia: Secondary | ICD-10-CM | POA: Diagnosis not present

## 2022-05-26 DIAGNOSIS — I10 Essential (primary) hypertension: Secondary | ICD-10-CM | POA: Diagnosis not present

## 2022-06-16 DIAGNOSIS — N289 Disorder of kidney and ureter, unspecified: Secondary | ICD-10-CM | POA: Diagnosis not present

## 2022-06-16 DIAGNOSIS — E669 Obesity, unspecified: Secondary | ICD-10-CM | POA: Diagnosis not present

## 2022-06-16 DIAGNOSIS — E785 Hyperlipidemia, unspecified: Secondary | ICD-10-CM | POA: Diagnosis not present

## 2022-06-16 DIAGNOSIS — J4520 Mild intermittent asthma, uncomplicated: Secondary | ICD-10-CM | POA: Diagnosis not present

## 2022-06-16 DIAGNOSIS — L409 Psoriasis, unspecified: Secondary | ICD-10-CM | POA: Diagnosis not present

## 2022-06-16 DIAGNOSIS — G8929 Other chronic pain: Secondary | ICD-10-CM | POA: Diagnosis not present

## 2022-06-16 DIAGNOSIS — K219 Gastro-esophageal reflux disease without esophagitis: Secondary | ICD-10-CM | POA: Diagnosis not present

## 2022-06-16 DIAGNOSIS — I10 Essential (primary) hypertension: Secondary | ICD-10-CM | POA: Diagnosis not present

## 2022-06-16 DIAGNOSIS — F418 Other specified anxiety disorders: Secondary | ICD-10-CM | POA: Diagnosis not present

## 2022-06-16 DIAGNOSIS — E1165 Type 2 diabetes mellitus with hyperglycemia: Secondary | ICD-10-CM | POA: Diagnosis not present

## 2022-06-16 DIAGNOSIS — G43111 Migraine with aura, intractable, with status migrainosus: Secondary | ICD-10-CM | POA: Diagnosis not present

## 2022-06-28 ENCOUNTER — Other Ambulatory Visit: Payer: Self-pay | Admitting: Diagnostic Neuroimaging

## 2022-06-28 DIAGNOSIS — G43109 Migraine with aura, not intractable, without status migrainosus: Secondary | ICD-10-CM

## 2022-07-14 DIAGNOSIS — E1165 Type 2 diabetes mellitus with hyperglycemia: Secondary | ICD-10-CM | POA: Diagnosis not present

## 2022-07-14 DIAGNOSIS — E669 Obesity, unspecified: Secondary | ICD-10-CM | POA: Diagnosis not present

## 2022-07-14 DIAGNOSIS — I10 Essential (primary) hypertension: Secondary | ICD-10-CM | POA: Diagnosis not present

## 2022-07-14 DIAGNOSIS — E785 Hyperlipidemia, unspecified: Secondary | ICD-10-CM | POA: Diagnosis not present

## 2022-07-14 DIAGNOSIS — L409 Psoriasis, unspecified: Secondary | ICD-10-CM | POA: Diagnosis not present

## 2022-07-14 DIAGNOSIS — K219 Gastro-esophageal reflux disease without esophagitis: Secondary | ICD-10-CM | POA: Diagnosis not present

## 2022-07-14 DIAGNOSIS — F418 Other specified anxiety disorders: Secondary | ICD-10-CM | POA: Diagnosis not present

## 2022-07-14 DIAGNOSIS — N289 Disorder of kidney and ureter, unspecified: Secondary | ICD-10-CM | POA: Diagnosis not present

## 2022-07-14 DIAGNOSIS — G8929 Other chronic pain: Secondary | ICD-10-CM | POA: Diagnosis not present

## 2022-07-14 DIAGNOSIS — J4520 Mild intermittent asthma, uncomplicated: Secondary | ICD-10-CM | POA: Diagnosis not present

## 2022-07-14 DIAGNOSIS — G43111 Migraine with aura, intractable, with status migrainosus: Secondary | ICD-10-CM | POA: Diagnosis not present

## 2022-07-26 ENCOUNTER — Other Ambulatory Visit: Payer: Self-pay | Admitting: Diagnostic Neuroimaging

## 2022-07-26 DIAGNOSIS — G43109 Migraine with aura, not intractable, without status migrainosus: Secondary | ICD-10-CM

## 2022-08-06 ENCOUNTER — Other Ambulatory Visit: Payer: Self-pay | Admitting: Diagnostic Neuroimaging

## 2022-08-06 DIAGNOSIS — G43109 Migraine with aura, not intractable, without status migrainosus: Secondary | ICD-10-CM

## 2022-08-11 DIAGNOSIS — F418 Other specified anxiety disorders: Secondary | ICD-10-CM | POA: Diagnosis not present

## 2022-08-11 DIAGNOSIS — L409 Psoriasis, unspecified: Secondary | ICD-10-CM | POA: Diagnosis not present

## 2022-08-11 DIAGNOSIS — E1165 Type 2 diabetes mellitus with hyperglycemia: Secondary | ICD-10-CM | POA: Diagnosis not present

## 2022-08-11 DIAGNOSIS — J4520 Mild intermittent asthma, uncomplicated: Secondary | ICD-10-CM | POA: Diagnosis not present

## 2022-08-11 DIAGNOSIS — E785 Hyperlipidemia, unspecified: Secondary | ICD-10-CM | POA: Diagnosis not present

## 2022-08-11 DIAGNOSIS — E669 Obesity, unspecified: Secondary | ICD-10-CM | POA: Diagnosis not present

## 2022-08-11 DIAGNOSIS — N289 Disorder of kidney and ureter, unspecified: Secondary | ICD-10-CM | POA: Diagnosis not present

## 2022-08-11 DIAGNOSIS — G8929 Other chronic pain: Secondary | ICD-10-CM | POA: Diagnosis not present

## 2022-08-11 DIAGNOSIS — G43111 Migraine with aura, intractable, with status migrainosus: Secondary | ICD-10-CM | POA: Diagnosis not present

## 2022-08-11 DIAGNOSIS — K219 Gastro-esophageal reflux disease without esophagitis: Secondary | ICD-10-CM | POA: Diagnosis not present

## 2022-08-11 DIAGNOSIS — I10 Essential (primary) hypertension: Secondary | ICD-10-CM | POA: Diagnosis not present

## 2022-08-23 ENCOUNTER — Other Ambulatory Visit: Payer: Self-pay | Admitting: Diagnostic Neuroimaging

## 2022-08-23 DIAGNOSIS — G43109 Migraine with aura, not intractable, without status migrainosus: Secondary | ICD-10-CM

## 2022-09-06 ENCOUNTER — Other Ambulatory Visit: Payer: Self-pay | Admitting: Diagnostic Neuroimaging

## 2022-09-06 DIAGNOSIS — G43109 Migraine with aura, not intractable, without status migrainosus: Secondary | ICD-10-CM

## 2022-09-20 DIAGNOSIS — R0602 Shortness of breath: Secondary | ICD-10-CM | POA: Diagnosis not present

## 2022-09-20 DIAGNOSIS — J189 Pneumonia, unspecified organism: Secondary | ICD-10-CM | POA: Diagnosis not present

## 2022-09-20 DIAGNOSIS — U071 COVID-19: Secondary | ICD-10-CM | POA: Diagnosis not present

## 2022-10-04 DIAGNOSIS — K219 Gastro-esophageal reflux disease without esophagitis: Secondary | ICD-10-CM | POA: Diagnosis not present

## 2022-10-04 DIAGNOSIS — L409 Psoriasis, unspecified: Secondary | ICD-10-CM | POA: Diagnosis not present

## 2022-10-04 DIAGNOSIS — E785 Hyperlipidemia, unspecified: Secondary | ICD-10-CM | POA: Diagnosis not present

## 2022-10-04 DIAGNOSIS — E669 Obesity, unspecified: Secondary | ICD-10-CM | POA: Diagnosis not present

## 2022-10-04 DIAGNOSIS — I10 Essential (primary) hypertension: Secondary | ICD-10-CM | POA: Diagnosis not present

## 2022-10-04 DIAGNOSIS — E1165 Type 2 diabetes mellitus with hyperglycemia: Secondary | ICD-10-CM | POA: Diagnosis not present

## 2022-10-04 DIAGNOSIS — G43111 Migraine with aura, intractable, with status migrainosus: Secondary | ICD-10-CM | POA: Diagnosis not present

## 2022-10-04 DIAGNOSIS — J4520 Mild intermittent asthma, uncomplicated: Secondary | ICD-10-CM | POA: Diagnosis not present

## 2022-10-04 DIAGNOSIS — G8929 Other chronic pain: Secondary | ICD-10-CM | POA: Diagnosis not present

## 2022-10-04 DIAGNOSIS — F418 Other specified anxiety disorders: Secondary | ICD-10-CM | POA: Diagnosis not present

## 2022-10-25 DIAGNOSIS — L0292 Furuncle, unspecified: Secondary | ICD-10-CM | POA: Diagnosis not present

## 2022-10-25 DIAGNOSIS — E669 Obesity, unspecified: Secondary | ICD-10-CM | POA: Diagnosis not present

## 2022-12-31 DIAGNOSIS — E669 Obesity, unspecified: Secondary | ICD-10-CM | POA: Diagnosis not present

## 2022-12-31 DIAGNOSIS — G8929 Other chronic pain: Secondary | ICD-10-CM | POA: Diagnosis not present

## 2022-12-31 DIAGNOSIS — I10 Essential (primary) hypertension: Secondary | ICD-10-CM | POA: Diagnosis not present

## 2022-12-31 DIAGNOSIS — J40 Bronchitis, not specified as acute or chronic: Secondary | ICD-10-CM | POA: Diagnosis not present

## 2022-12-31 DIAGNOSIS — E785 Hyperlipidemia, unspecified: Secondary | ICD-10-CM | POA: Diagnosis not present

## 2022-12-31 DIAGNOSIS — L409 Psoriasis, unspecified: Secondary | ICD-10-CM | POA: Diagnosis not present

## 2022-12-31 DIAGNOSIS — J4520 Mild intermittent asthma, uncomplicated: Secondary | ICD-10-CM | POA: Diagnosis not present

## 2022-12-31 DIAGNOSIS — F418 Other specified anxiety disorders: Secondary | ICD-10-CM | POA: Diagnosis not present

## 2022-12-31 DIAGNOSIS — G43111 Migraine with aura, intractable, with status migrainosus: Secondary | ICD-10-CM | POA: Diagnosis not present

## 2022-12-31 DIAGNOSIS — E1165 Type 2 diabetes mellitus with hyperglycemia: Secondary | ICD-10-CM | POA: Diagnosis not present

## 2022-12-31 DIAGNOSIS — K219 Gastro-esophageal reflux disease without esophagitis: Secondary | ICD-10-CM | POA: Diagnosis not present

## 2023-03-28 DIAGNOSIS — J4520 Mild intermittent asthma, uncomplicated: Secondary | ICD-10-CM | POA: Diagnosis not present

## 2023-03-28 DIAGNOSIS — E669 Obesity, unspecified: Secondary | ICD-10-CM | POA: Diagnosis not present

## 2023-03-28 DIAGNOSIS — L409 Psoriasis, unspecified: Secondary | ICD-10-CM | POA: Diagnosis not present

## 2023-03-28 DIAGNOSIS — E1165 Type 2 diabetes mellitus with hyperglycemia: Secondary | ICD-10-CM | POA: Diagnosis not present

## 2023-03-28 DIAGNOSIS — G43111 Migraine with aura, intractable, with status migrainosus: Secondary | ICD-10-CM | POA: Diagnosis not present

## 2023-03-28 DIAGNOSIS — I10 Essential (primary) hypertension: Secondary | ICD-10-CM | POA: Diagnosis not present

## 2023-03-28 DIAGNOSIS — G8929 Other chronic pain: Secondary | ICD-10-CM | POA: Diagnosis not present

## 2023-03-28 DIAGNOSIS — E785 Hyperlipidemia, unspecified: Secondary | ICD-10-CM | POA: Diagnosis not present

## 2023-03-28 DIAGNOSIS — K219 Gastro-esophageal reflux disease without esophagitis: Secondary | ICD-10-CM | POA: Diagnosis not present

## 2023-03-28 DIAGNOSIS — F418 Other specified anxiety disorders: Secondary | ICD-10-CM | POA: Diagnosis not present

## 2023-05-18 ENCOUNTER — Other Ambulatory Visit: Payer: Self-pay | Admitting: Diagnostic Neuroimaging

## 2023-05-18 DIAGNOSIS — G43109 Migraine with aura, not intractable, without status migrainosus: Secondary | ICD-10-CM

## 2023-05-19 ENCOUNTER — Telehealth: Payer: Self-pay | Admitting: Diagnostic Neuroimaging

## 2023-05-19 DIAGNOSIS — G43109 Migraine with aura, not intractable, without status migrainosus: Secondary | ICD-10-CM

## 2023-05-19 MED ORDER — AIMOVIG 70 MG/ML ~~LOC~~ SOAJ
70.0000 mg | SUBCUTANEOUS | 0 refills | Status: AC
Start: 2023-05-19 — End: ?

## 2023-05-19 NOTE — Telephone Encounter (Signed)
 Informed to called Pt back to reschedule for an earlier appt. LVM for Pt to callback.  If Pt call back please schedule Pt with NP this month if possible.

## 2023-05-19 NOTE — Telephone Encounter (Signed)
 Pt called in regard to requesting a refill medication  Erenumab -aooe (AIMOVIG ) 70 MG/ML SOAJ .  Pt state she need a prior authorization. Informed Pt must have an appointment scheduled .  Scheduled appt .  Pt would like medication sent to : [facility] - [place], [place] - [address]

## 2023-05-20 ENCOUNTER — Other Ambulatory Visit: Payer: Self-pay | Admitting: Neurology

## 2023-05-20 MED ORDER — RIZATRIPTAN BENZOATE 10 MG PO TBDP: ORAL_TABLET | ORAL | 0 refills | Status: AC

## 2023-05-20 NOTE — Progress Notes (Signed)
 Patient called requesting PA for Aimovig , informed her that she needs an appointment prior to submitting the auth (last visit was in 2023). She is currently complaining of severe headaches. Rx 9 tablets of Rizatriptan .   Dr. Samara Crest

## 2023-05-23 ENCOUNTER — Other Ambulatory Visit (HOSPITAL_COMMUNITY): Payer: Self-pay

## 2023-05-23 NOTE — Progress Notes (Signed)
 PA started but cannot be completed until patient is seen due to questions and criteria required from insurance for submission

## 2023-06-27 DIAGNOSIS — K219 Gastro-esophageal reflux disease without esophagitis: Secondary | ICD-10-CM | POA: Diagnosis not present

## 2023-06-27 DIAGNOSIS — E669 Obesity, unspecified: Secondary | ICD-10-CM | POA: Diagnosis not present

## 2023-06-27 DIAGNOSIS — G43111 Migraine with aura, intractable, with status migrainosus: Secondary | ICD-10-CM | POA: Diagnosis not present

## 2023-06-27 DIAGNOSIS — F418 Other specified anxiety disorders: Secondary | ICD-10-CM | POA: Diagnosis not present

## 2023-06-27 DIAGNOSIS — M545 Low back pain, unspecified: Secondary | ICD-10-CM | POA: Diagnosis not present

## 2023-06-27 DIAGNOSIS — I10 Essential (primary) hypertension: Secondary | ICD-10-CM | POA: Diagnosis not present

## 2023-06-27 DIAGNOSIS — L409 Psoriasis, unspecified: Secondary | ICD-10-CM | POA: Diagnosis not present

## 2023-06-27 DIAGNOSIS — Z760 Encounter for issue of repeat prescription: Secondary | ICD-10-CM | POA: Diagnosis not present

## 2023-06-27 DIAGNOSIS — F3170 Bipolar disorder, currently in remission, most recent episode unspecified: Secondary | ICD-10-CM | POA: Diagnosis not present

## 2023-06-27 DIAGNOSIS — G8929 Other chronic pain: Secondary | ICD-10-CM | POA: Diagnosis not present

## 2023-06-27 DIAGNOSIS — J4520 Mild intermittent asthma, uncomplicated: Secondary | ICD-10-CM | POA: Diagnosis not present

## 2023-06-27 DIAGNOSIS — E785 Hyperlipidemia, unspecified: Secondary | ICD-10-CM | POA: Diagnosis not present

## 2023-06-27 DIAGNOSIS — Z6827 Body mass index (BMI) 27.0-27.9, adult: Secondary | ICD-10-CM | POA: Diagnosis not present

## 2023-06-27 DIAGNOSIS — F319 Bipolar disorder, unspecified: Secondary | ICD-10-CM | POA: Diagnosis not present

## 2023-06-27 DIAGNOSIS — E1165 Type 2 diabetes mellitus with hyperglycemia: Secondary | ICD-10-CM | POA: Diagnosis not present

## 2023-10-13 ENCOUNTER — Telehealth: Payer: Self-pay | Admitting: Diagnostic Neuroimaging

## 2023-10-13 NOTE — Telephone Encounter (Signed)
 Pt called yelling and very irate demanding to speak to a nurse because she stated that she has not had her Aimovig  inj for months because the pharmacy informed her that it is needing a PA but she has not heard from anyone. Pt stated that she is in pain and does not understand why she can't just get it authorized so that she can get it to cover her till she is seen. Pt continued yelling and threatening to leave our practice because we do not know what we are doing. Please advise. I asked the pt that I would need her to lower her voice and to let me explain and she kept talking over me. I informed her that I can not respond with her yelling so I informed her that I would send a note and to have a great day and I disconnected the call. Please advise.

## 2023-10-14 NOTE — Telephone Encounter (Signed)
Call to patient, no answer. Unable to leave message.

## 2023-10-17 NOTE — Telephone Encounter (Signed)
 Called pt. Unable to [facility] for pt that I called.

## 2023-11-14 ENCOUNTER — Encounter: Payer: Self-pay | Admitting: Diagnostic Neuroimaging

## 2023-11-14 ENCOUNTER — Ambulatory Visit: Admitting: Diagnostic Neuroimaging

## 2023-11-18 DIAGNOSIS — L409 Psoriasis, unspecified: Secondary | ICD-10-CM | POA: Diagnosis not present

## 2023-11-30 ENCOUNTER — Ambulatory Visit: Admitting: Adult Health

## 2023-11-30 ENCOUNTER — Encounter: Payer: Self-pay | Admitting: Adult Health

## 2023-11-30 NOTE — Progress Notes (Deleted)
 [facility]  PATIENT: Michelle [name] Maiden DOB: 1965-02-07  REFERRING CLINICIAN: Rosalea Rosina SAILOR, PA HISTORY FROM: patient  REASON FOR VISIT: follow up   HISTORICAL  CHIEF COMPLAINT:  No chief complaint on file.   HISTORY OF PRESENT ILLNESS:   Update 11/30/2023 JM: Patient returns for follow-up visit after prior visit with Dr. Margaret over 2 years ago.  She has not been able to get her Aimovig  filled due to lack of follow-up visits.  She had multiple previously scheduled visits but no-showed or canceled.     UPDATE (03/23/21, VRP): Since last visit, doing well. Symptoms are stable. Severity is mild. No alleviating or aggravating factors. Tolerating meds.   UPDATE (11/12/19, VRP): Since last visit, doing well. HA now 2-3 per month. Symptoms are improved. Severity is mild. No alleviating or aggravating factors. Tolerating aimovig  and rizatriptan .     PRIOR HPI: 58 year old female here for evaluation of headaches.   Patient has had a since childhood with severe head and scalp pain.  At that time she was told that headaches were related to excessively long hair.  Patient had some nausea with her headaches.  Over time she outgrew these headaches.  Patient also has family history of migraine headaches in her mother.   2008 patient had recurrence of headaches.  This time with frontal and right-sided pain and pressure with throbbing sensation, nausea, sensitivity to light and sound.  Patient has 2-3 severe headaches per month lasting days at a time.  She averages 8 to 12 days of headache per month.  Patient has been diagnosed with migraine headache.  She tried topiramate but this caused nausea.  She tried Imitrex without relief.  She tried over-the-counter medicines including Tylenol  ibuprofen  without relief.  She is also on Friday medications for mood and pain control including duloxetine , Celexa, oxycodone , Robaxin .   REVIEW OF SYSTEMS: Full 14 system review of  systems performed and negative with exception of: as per HPI.  ALLERGIES: Allergies  Allergen Reactions   Penicillins Itching, Nausea And Vomiting and Rash    Has patient had a PCN reaction causing immediate rash, facial/tongue/throat swelling, SOB or lightheadedness with hypotension: #  #  #  YES  #  #  #  Has patient had a PCN reaction causing severe rash involving mucus membranes or skin necrosis: No Has patient had a PCN reaction that required hospitalization No Has patient had a PCN reaction occurring within the last 10 years: No If all of the above answers are NO, then may proceed with Cephalosporin use.    Tylenol  [Acetaminophen ] Other (See Comments)    Dr advised not to take due to kidney failure in 12   Imitrex [Sumatriptan] Itching    sleepiness   Codeine Itching    HOME MEDICATIONS: Outpatient Medications Prior to Visit  Medication Sig Dispense Refill   Adalimumab (HUMIRA) 40 MG/0.4ML PSKT Inject into the skin.     cholecalciferol (VITAMIN D) 1000 units tablet Take 3,000 Units by mouth daily.     citalopram (CELEXA) 20 MG tablet Take 20 mg by mouth daily.     Erenumab -aooe (AIMOVIG ) 70 MG/ML SOAJ Inject 70 mg into the skin every 30 (thirty) days. 1 mL 0   furosemide  (LASIX ) 40 MG tablet Take 40 mg by mouth. (Patient not taking: Reported on 11/12/2019)     gabapentin  (NEURONTIN ) 300 MG capsule Take 300-600 mg by mouth 3 (three) times daily.     hydrochlorothiazide (HYDRODIURIL) 25 MG tablet Take 25  mg by mouth daily.     Ipratropium-Albuterol  (COMBIVENT RESPIMAT) 20-100 MCG/ACT AERS respimat Inhale 2 puffs into the lungs 3 (three) times daily as needed for wheezing.      methocarbamol  (ROBAXIN ) 750 MG tablet Take 1 tablet (750 mg total) by mouth every 8 (eight) hours as needed for muscle spasms. 50 tablet 0   omeprazole  (PRILOSEC) 20 MG capsule Take 20 mg by mouth 2 (two) times daily before a meal.     rizatriptan  (MAXALT -MLT) 10 MG disintegrating tablet DISSOLVE 1 TABLET  BY MOUTH AS NEEDED FOR MIGRAINE. MAY REPEAT IN 2 HOURS IF NEEDED. 9 tablet 0   traZODone  (DESYREL ) 100 MG tablet Take 100 mg by mouth at bedtime.     vitamin B-12 (CYANOCOBALAMIN ) 500 MCG tablet Take by mouth.     Vitamin D, Ergocalciferol, (DRISDOL) 1.25 MG (50000 UNIT) CAPS capsule Take 50,000 Units by mouth once a week.     No facility-administered medications prior to visit.      PHYSICAL EXAM  Telephone visit  DIAGNOSTIC DATA (LABS, IMAGING, TESTING) - I reviewed patient records, labs, notes, testing and imaging myself where available.  Lab Results  Component Value Date   WBC 6.4 03/03/2020   HGB 15.4 (H) 03/03/2020   HCT 46.6 (H) 03/03/2020   MCV 98.1 03/03/2020   PLT 132 (L) 03/03/2020      Component Value Date/Time   NA 135 03/03/2020 1845   K 3.3 (L) 03/03/2020 1845   CL 97 (L) 03/03/2020 1845   CO2 29 03/03/2020 1845   GLUCOSE 120 (H) 03/03/2020 1845   BUN 16 03/03/2020 1845   CREATININE 1.00 03/03/2020 1845   CALCIUM 8.9 03/03/2020 1845   PROT 8.2 (H) 03/03/2020 1845   ALBUMIN  3.7 03/03/2020 1845   AST 19 03/03/2020 1845   ALT 17 03/03/2020 1845   ALKPHOS 91 03/03/2020 1845   BILITOT 0.7 03/03/2020 1845   GFRNONAA >60 03/03/2020 1845   GFRAA >60 04/24/2019 1344   Lab Results  Component Value Date   CHOL 127 04/26/2008   HDL 46 04/26/2008   LDLCALC 66 04/26/2008   TRIG 73 04/26/2008   CHOLHDL 2.8 Ratio 04/26/2008   Lab Results  Component Value Date   HGBA1C 5.7 (H) 02/11/2016   No results found for: VITAMINB12 No results found for: TSH   05/14/11 Normal unenhanced cranial CT.    ASSESSMENT AND PLAN  58 y.o. year old female here with:   Dx:  No diagnosis found.    PLAN:  MIGRAINE WITH AURA  MIGRAINE PREVENTION  --> continue aimovig  (improving on medication)   MIGRAINE RESCUE  --> rizatriptan  10mg  as needed for breakthrough headache; may repeat x 1 after 2 hours; max 2 tabs per day or 8 per month  No orders of the defined  types were placed in this encounter.   No follow-ups on file. Future refills per PCP    I personally spent a total of *** minutes in the care of the patient today including {Time Based Coding:210964241}.  Harlene Bogaert, AGNP-BC  [facility] [address] [place], [place] [address]  Phone [phone] Fax [phone] Note: This document was prepared with digital dictation and possible smart phrase technology. Any transcriptional errors that result from this process are unintentional.
